# Patient Record
Sex: Male | Born: 1940 | Race: Black or African American | Hispanic: No | Smoking: Former smoker
Health system: Southern US, Community
[De-identification: ages and names within clinical notes are randomized; demographics above are authoritative.]

## PROBLEM LIST (undated history)

## (undated) DIAGNOSIS — T7840XA Allergy, unspecified, initial encounter: Secondary | ICD-10-CM

## (undated) DIAGNOSIS — G459 Transient cerebral ischemic attack, unspecified: Secondary | ICD-10-CM

## (undated) DIAGNOSIS — I509 Heart failure, unspecified: Secondary | ICD-10-CM

## (undated) DIAGNOSIS — R0601 Orthopnea: Secondary | ICD-10-CM

## (undated) DIAGNOSIS — N289 Disorder of kidney and ureter, unspecified: Secondary | ICD-10-CM

## (undated) DIAGNOSIS — M109 Gout, unspecified: Secondary | ICD-10-CM

## (undated) DIAGNOSIS — G4700 Insomnia, unspecified: Secondary | ICD-10-CM

## (undated) DIAGNOSIS — I2510 Atherosclerotic heart disease of native coronary artery without angina pectoris: Secondary | ICD-10-CM

## (undated) HISTORY — DX: Insomnia, unspecified: G47.00

## (undated) HISTORY — DX: Allergy, unspecified, initial encounter: T78.40XA

## (undated) HISTORY — PX: CARDIAC SURGERY: SHX584

## (undated) HISTORY — DX: Gout, unspecified: M10.9

## (undated) HISTORY — PX: JOINT REPLACEMENT: SHX530

## (undated) HISTORY — DX: Orthopnea: R06.01

---

## 2004-11-05 ENCOUNTER — Ambulatory Visit: Payer: Self-pay | Admitting: Rheumatology

## 2004-12-08 ENCOUNTER — Emergency Department: Payer: Self-pay | Admitting: Emergency Medicine

## 2004-12-12 ENCOUNTER — Inpatient Hospital Stay: Payer: Self-pay | Admitting: Internal Medicine

## 2005-03-18 ENCOUNTER — Ambulatory Visit: Payer: Self-pay | Admitting: Cardiovascular Disease

## 2005-04-02 ENCOUNTER — Encounter: Payer: Self-pay | Admitting: Cardiovascular Disease

## 2005-04-15 ENCOUNTER — Observation Stay: Payer: Self-pay | Admitting: Cardiovascular Disease

## 2005-04-15 ENCOUNTER — Other Ambulatory Visit: Payer: Self-pay

## 2005-04-16 ENCOUNTER — Other Ambulatory Visit: Payer: Self-pay

## 2005-05-01 ENCOUNTER — Encounter: Payer: Self-pay | Admitting: Cardiovascular Disease

## 2007-04-06 ENCOUNTER — Ambulatory Visit: Payer: Self-pay | Admitting: Cardiovascular Disease

## 2008-01-09 ENCOUNTER — Other Ambulatory Visit: Payer: Self-pay

## 2008-01-10 ENCOUNTER — Inpatient Hospital Stay: Payer: Self-pay | Admitting: Internal Medicine

## 2008-01-26 ENCOUNTER — Other Ambulatory Visit: Payer: Self-pay

## 2008-01-26 ENCOUNTER — Emergency Department: Payer: Self-pay | Admitting: Emergency Medicine

## 2008-09-27 ENCOUNTER — Ambulatory Visit: Payer: Self-pay | Admitting: Ophthalmology

## 2008-10-12 ENCOUNTER — Ambulatory Visit: Payer: Self-pay | Admitting: Ophthalmology

## 2009-11-05 ENCOUNTER — Inpatient Hospital Stay: Payer: Self-pay | Admitting: Internal Medicine

## 2011-02-11 ENCOUNTER — Ambulatory Visit: Payer: Self-pay | Admitting: General Practice

## 2011-02-27 ENCOUNTER — Inpatient Hospital Stay: Payer: Self-pay | Admitting: General Practice

## 2011-03-13 ENCOUNTER — Observation Stay: Payer: Self-pay | Admitting: Internal Medicine

## 2012-04-22 ENCOUNTER — Emergency Department: Payer: Self-pay | Admitting: Emergency Medicine

## 2012-04-22 LAB — COMPREHENSIVE METABOLIC PANEL
Albumin: 3.9 g/dL (ref 3.4–5.0)
Alkaline Phosphatase: 71 U/L (ref 50–136)
BUN: 13 mg/dL (ref 7–18)
Calcium, Total: 8.8 mg/dL (ref 8.5–10.1)
EGFR (African American): 59 — ABNORMAL LOW
Glucose: 89 mg/dL (ref 65–99)
Osmolality: 290 (ref 275–301)
Potassium: 4 mmol/L (ref 3.5–5.1)
SGOT(AST): 20 U/L (ref 15–37)
SGPT (ALT): 22 U/L (ref 12–78)
Sodium: 146 mmol/L — ABNORMAL HIGH (ref 136–145)
Total Protein: 7 g/dL (ref 6.4–8.2)

## 2012-04-22 LAB — CBC
HCT: 39.2 % — ABNORMAL LOW (ref 40.0–52.0)
HGB: 12.9 g/dL — ABNORMAL LOW (ref 13.0–18.0)
MCH: 27.7 pg (ref 26.0–34.0)
MCHC: 32.9 g/dL (ref 32.0–36.0)
WBC: 7.6 10*3/uL (ref 3.8–10.6)

## 2012-04-22 LAB — PROTIME-INR: INR: 1.1

## 2012-04-23 ENCOUNTER — Emergency Department: Payer: Self-pay | Admitting: Emergency Medicine

## 2012-04-23 LAB — COMPREHENSIVE METABOLIC PANEL
Anion Gap: 10 (ref 7–16)
Bilirubin,Total: 0.9 mg/dL (ref 0.2–1.0)
Calcium, Total: 8.9 mg/dL (ref 8.5–10.1)
Chloride: 110 mmol/L — ABNORMAL HIGH (ref 98–107)
Co2: 23 mmol/L (ref 21–32)
EGFR (African American): >60
EGFR (Non-African Amer.): >60
Osmolality: 285 (ref 275–301)
Potassium: 3.2 mmol/L — ABNORMAL LOW (ref 3.5–5.1)
SGOT(AST): 19 U/L (ref 15–37)
SGPT (ALT): 23 U/L (ref 12–78)
Sodium: 143 mmol/L (ref 136–145)

## 2012-04-23 LAB — PROTIME-INR
INR: 1.1
Prothrombin Time: 14.2 secs (ref 11.5–14.7)

## 2012-04-23 LAB — HEMOGLOBIN: HGB: 12.9 g/dL — ABNORMAL LOW (ref 13.0–18.0)

## 2014-07-04 ENCOUNTER — Ambulatory Visit: Payer: Self-pay | Admitting: General Surgery

## 2014-07-07 ENCOUNTER — Inpatient Hospital Stay: Payer: Self-pay | Admitting: Internal Medicine

## 2014-07-07 LAB — BASIC METABOLIC PANEL
Anion Gap: 13 (ref 7–16)
BUN: 34 mg/dL — ABNORMAL HIGH (ref 7–18)
CHLORIDE: 106 mmol/L (ref 98–107)
CREATININE: 2.15 mg/dL — AB (ref 0.60–1.30)
Calcium, Total: 8.2 mg/dL — ABNORMAL LOW (ref 8.5–10.1)
Co2: 19 mmol/L — ABNORMAL LOW (ref 21–32)
GFR CALC AF AMER: 39 — AB
GFR CALC NON AF AMER: 32 — AB
Glucose: 131 mg/dL — ABNORMAL HIGH (ref 65–99)
Osmolality: 285 (ref 275–301)
Potassium: 3.6 mmol/L (ref 3.5–5.1)
Sodium: 138 mmol/L (ref 136–145)

## 2014-07-07 LAB — CBC WITH DIFFERENTIAL/PLATELET
Basophil #: 0 10*3/uL (ref 0.0–0.1)
Basophil %: 0.5 %
EOS PCT: 0.1 %
Eosinophil #: 0 10*3/uL (ref 0.0–0.7)
HCT: 37.4 % — ABNORMAL LOW (ref 40.0–52.0)
HGB: 12 g/dL — ABNORMAL LOW (ref 13.0–18.0)
LYMPHS ABS: 0.8 10*3/uL — AB (ref 1.0–3.6)
Lymphocyte %: 18 %
MCH: 27.3 pg (ref 26.0–34.0)
MCHC: 32.2 g/dL (ref 32.0–36.0)
MCV: 85 fL (ref 80–100)
MONOS PCT: 12.4 %
Monocyte #: 0.5 x10 3/mm (ref 0.2–1.0)
Neutrophil #: 3 10*3/uL (ref 1.4–6.5)
Neutrophil %: 69 %
PLATELETS: 143 10*3/uL — AB (ref 150–440)
RBC: 4.4 10*6/uL (ref 4.40–5.90)
RDW: 14.5 % (ref 11.5–14.5)
WBC: 4.3 10*3/uL (ref 3.8–10.6)

## 2014-07-07 LAB — TROPONIN I: Troponin-I: 0.05 ng/mL

## 2014-07-08 LAB — PRO B NATRIURETIC PEPTIDE: B-Type Natriuretic Peptide: 6407 pg/mL — ABNORMAL HIGH (ref 0–125)

## 2014-07-08 LAB — TROPONIN I
TROPONIN-I: 0.04 ng/mL
Troponin-I: 0.04 ng/mL

## 2014-07-08 LAB — PROTIME-INR
INR: 1.1
Prothrombin Time: 14.4 secs (ref 11.5–14.7)

## 2014-07-08 LAB — APTT: Activated PTT: 25.8 secs (ref 23.6–35.9)

## 2014-07-09 LAB — CBC WITH DIFFERENTIAL/PLATELET
BASOS ABS: 0 10*3/uL (ref 0.0–0.1)
Basophil %: 0.4 %
EOS ABS: 0.1 10*3/uL (ref 0.0–0.7)
Eosinophil %: 1.4 %
HCT: 34.7 % — AB (ref 40.0–52.0)
HGB: 11.2 g/dL — ABNORMAL LOW (ref 13.0–18.0)
Lymphocyte #: 1 10*3/uL (ref 1.0–3.6)
Lymphocyte %: 24.5 %
MCH: 27.1 pg (ref 26.0–34.0)
MCHC: 32.3 g/dL (ref 32.0–36.0)
MCV: 84 fL (ref 80–100)
MONOS PCT: 12.7 %
Monocyte #: 0.5 x10 3/mm (ref 0.2–1.0)
Neutrophil #: 2.4 10*3/uL (ref 1.4–6.5)
Neutrophil %: 61 %
PLATELETS: 132 10*3/uL — AB (ref 150–440)
RBC: 4.13 10*6/uL — AB (ref 4.40–5.90)
RDW: 14.5 % (ref 11.5–14.5)
WBC: 3.9 10*3/uL (ref 3.8–10.6)

## 2014-07-09 LAB — BASIC METABOLIC PANEL
Anion Gap: 10 (ref 7–16)
BUN: 29 mg/dL — ABNORMAL HIGH (ref 7–18)
CO2: 23 mmol/L (ref 21–32)
CREATININE: 1.79 mg/dL — AB (ref 0.60–1.30)
Calcium, Total: 8.3 mg/dL — ABNORMAL LOW (ref 8.5–10.1)
Chloride: 105 mmol/L (ref 98–107)
EGFR (African American): 48 — ABNORMAL LOW
EGFR (Non-African Amer.): 40 — ABNORMAL LOW
Glucose: 111 mg/dL — ABNORMAL HIGH (ref 65–99)
Osmolality: 282 (ref 275–301)
Potassium: 3 mmol/L — ABNORMAL LOW (ref 3.5–5.1)
Sodium: 138 mmol/L (ref 136–145)

## 2014-07-10 DIAGNOSIS — I5023 Acute on chronic systolic (congestive) heart failure: Secondary | ICD-10-CM

## 2014-07-10 DIAGNOSIS — I2510 Atherosclerotic heart disease of native coronary artery without angina pectoris: Secondary | ICD-10-CM

## 2014-07-10 DIAGNOSIS — I480 Paroxysmal atrial fibrillation: Secondary | ICD-10-CM

## 2014-07-10 DIAGNOSIS — I429 Cardiomyopathy, unspecified: Secondary | ICD-10-CM

## 2014-07-10 LAB — BASIC METABOLIC PANEL
Anion Gap: 9 (ref 7–16)
BUN: 24 mg/dL — ABNORMAL HIGH (ref 7–18)
CO2: 22 mmol/L (ref 21–32)
Calcium, Total: 8.4 mg/dL — ABNORMAL LOW (ref 8.5–10.1)
Chloride: 106 mmol/L (ref 98–107)
Creatinine: 1.52 mg/dL — ABNORMAL HIGH (ref 0.60–1.30)
EGFR (Non-African Amer.): 48 — ABNORMAL LOW
GFR CALC AF AMER: 59 — AB
Glucose: 128 mg/dL — ABNORMAL HIGH (ref 65–99)
Osmolality: 280 (ref 275–301)
Potassium: 3.6 mmol/L (ref 3.5–5.1)
Sodium: 137 mmol/L (ref 136–145)

## 2014-07-11 ENCOUNTER — Telehealth: Payer: Self-pay | Admitting: Physician Assistant

## 2014-07-11 NOTE — Telephone Encounter (Signed)
[facility] called to let us know that pt was seen in ED on the 9th and that dr Welton Flakeskhan was suppose to do a consult on pt. But he refused to see him and saw R.Dunn. Thus R.Dunn did consult on him.  Now [facility] is calling to let us know that pt changed his mind and is now seeing dr Welton Flakeskhan. Dr Welton FlakesKhan and R.Dunn both are aware of this.

## 2014-07-12 LAB — CBC WITH DIFFERENTIAL/PLATELET
Basophil #: 0.1 10*3/uL (ref 0.0–0.1)
Basophil %: 0.6 %
EOS PCT: 0.7 %
Eosinophil #: 0.1 10*3/uL (ref 0.0–0.7)
HCT: 38.1 % — AB (ref 40.0–52.0)
HGB: 12.3 g/dL — ABNORMAL LOW (ref 13.0–18.0)
LYMPHS ABS: 1.3 10*3/uL (ref 1.0–3.6)
LYMPHS PCT: 10.8 %
MCH: 27 pg (ref 26.0–34.0)
MCHC: 32.3 g/dL (ref 32.0–36.0)
MCV: 84 fL (ref 80–100)
MONOS PCT: 8.8 %
Monocyte #: 1 x10 3/mm (ref 0.2–1.0)
NEUTROS ABS: 9.3 10*3/uL — AB (ref 1.4–6.5)
Neutrophil %: 79.1 %
Platelet: 303 10*3/uL (ref 150–440)
RBC: 4.56 10*6/uL (ref 4.40–5.90)
RDW: 14.2 % (ref 11.5–14.5)
WBC: 11.8 10*3/uL — ABNORMAL HIGH (ref 3.8–10.6)

## 2014-07-12 LAB — BASIC METABOLIC PANEL
ANION GAP: 9 (ref 7–16)
BUN: 20 mg/dL — AB (ref 7–18)
CALCIUM: 8.1 mg/dL — AB (ref 8.5–10.1)
Chloride: 107 mmol/L (ref 98–107)
Co2: 22 mmol/L (ref 21–32)
Creatinine: 1.41 mg/dL — ABNORMAL HIGH (ref 0.60–1.30)
EGFR (African American): >60
EGFR (Non-African Amer.): 53 — ABNORMAL LOW
GLUCOSE: 114 mg/dL — AB (ref 65–99)
Osmolality: 279 (ref 275–301)
Potassium: 3.7 mmol/L (ref 3.5–5.1)
Sodium: 138 mmol/L (ref 136–145)

## 2014-07-12 LAB — MAGNESIUM: MAGNESIUM: 2.1 mg/dL

## 2014-07-12 LAB — POTASSIUM: Potassium: 3.7 mmol/L (ref 3.5–5.1)

## 2014-07-13 LAB — CBC WITH DIFFERENTIAL/PLATELET
BASOS ABS: 0 10*3/uL (ref 0.0–0.1)
Basophil %: 0.2 %
EOS ABS: 0 10*3/uL (ref 0.0–0.7)
Eosinophil %: 0.4 %
HCT: 39.5 % — ABNORMAL LOW (ref 40.0–52.0)
HGB: 12.8 g/dL — ABNORMAL LOW (ref 13.0–18.0)
LYMPHS ABS: 0.9 10*3/uL — AB (ref 1.0–3.6)
Lymphocyte %: 7.3 %
MCH: 27 pg (ref 26.0–34.0)
MCHC: 32.3 g/dL (ref 32.0–36.0)
MCV: 84 fL (ref 80–100)
MONO ABS: 1.4 x10 3/mm — AB (ref 0.2–1.0)
Monocyte %: 11.4 %
NEUTROS ABS: 9.7 10*3/uL — AB (ref 1.4–6.5)
Neutrophil %: 80.7 %
PLATELETS: 378 10*3/uL (ref 150–440)
RBC: 4.72 10*6/uL (ref 4.40–5.90)
RDW: 14.7 % — AB (ref 11.5–14.5)
WBC: 12 10*3/uL — AB (ref 3.8–10.6)

## 2014-07-13 LAB — BASIC METABOLIC PANEL
ANION GAP: 9 (ref 7–16)
BUN: 22 mg/dL — AB (ref 7–18)
CO2: 21 mmol/L (ref 21–32)
CREATININE: 1.67 mg/dL — AB (ref 0.60–1.30)
Calcium, Total: 8.3 mg/dL — ABNORMAL LOW (ref 8.5–10.1)
Chloride: 107 mmol/L (ref 98–107)
EGFR (African American): 52 — ABNORMAL LOW
EGFR (Non-African Amer.): 43 — ABNORMAL LOW
GLUCOSE: 158 mg/dL — AB (ref 65–99)
OSMOLALITY: 280 (ref 275–301)
POTASSIUM: 3.8 mmol/L (ref 3.5–5.1)
Sodium: 137 mmol/L (ref 136–145)

## 2014-07-13 LAB — MAGNESIUM: Magnesium: 2 mg/dL

## 2014-07-13 LAB — APTT: Activated PTT: 60.9 secs — ABNORMAL HIGH (ref 23.6–35.9)

## 2014-07-13 LAB — PROTIME-INR
INR: 1.5
Prothrombin Time: 18.1 secs — ABNORMAL HIGH (ref 11.5–14.7)

## 2014-07-13 LAB — HEPARIN LEVEL (UNFRACTIONATED): Anti-Xa(Unfractionated): >1.1 IU/mL (ref 0.30–0.70)

## 2014-07-14 LAB — CBC
HCT: 36.5 % — AB (ref 40.0–52.0)
HGB: 11.7 g/dL — AB (ref 13.0–18.0)
MCH: 26.7 pg (ref 26.0–34.0)
MCHC: 32 g/dL (ref 32.0–36.0)
MCV: 84 fL (ref 80–100)
PLATELETS: 374 10*3/uL (ref 150–440)
RBC: 4.36 10*6/uL — ABNORMAL LOW (ref 4.40–5.90)
RDW: 14.7 % — ABNORMAL HIGH (ref 11.5–14.5)
WBC: 10.6 10*3/uL (ref 3.8–10.6)

## 2014-07-14 LAB — BASIC METABOLIC PANEL
Anion Gap: 9 (ref 7–16)
BUN: 16 mg/dL (ref 7–18)
CALCIUM: 8.1 mg/dL — AB (ref 8.5–10.1)
CHLORIDE: 109 mmol/L — AB (ref 98–107)
Co2: 20 mmol/L — ABNORMAL LOW (ref 21–32)
Creatinine: 1.35 mg/dL — ABNORMAL HIGH (ref 0.60–1.30)
EGFR (African American): >60
EGFR (Non-African Amer.): 55 — ABNORMAL LOW
Glucose: 134 mg/dL — ABNORMAL HIGH (ref 65–99)
OSMOLALITY: 279 (ref 275–301)
Potassium: 4.2 mmol/L (ref 3.5–5.1)
Sodium: 138 mmol/L (ref 136–145)

## 2014-07-14 LAB — HEPARIN LEVEL (UNFRACTIONATED): Anti-Xa(Unfractionated): >1.1 IU/mL (ref 0.30–0.70)

## 2014-07-15 LAB — COMPREHENSIVE METABOLIC PANEL
Albumin: 2.5 g/dL — ABNORMAL LOW (ref 3.4–5.0)
Alkaline Phosphatase: 101 U/L
Anion Gap: 10 (ref 7–16)
BUN: 19 mg/dL — ABNORMAL HIGH (ref 7–18)
Bilirubin,Total: 0.8 mg/dL (ref 0.2–1.0)
CALCIUM: 8.5 mg/dL (ref 8.5–10.1)
CREATININE: 1.68 mg/dL — AB (ref 0.60–1.30)
Chloride: 107 mmol/L (ref 98–107)
Co2: 22 mmol/L (ref 21–32)
EGFR (African American): 52 — ABNORMAL LOW
EGFR (Non-African Amer.): 43 — ABNORMAL LOW
GLUCOSE: 193 mg/dL — AB (ref 65–99)
Osmolality: 285 (ref 275–301)
POTASSIUM: 3.8 mmol/L (ref 3.5–5.1)
SGOT(AST): 16 U/L (ref 15–37)
SGPT (ALT): 18 U/L
SODIUM: 139 mmol/L (ref 136–145)
Total Protein: 6.5 g/dL (ref 6.4–8.2)

## 2014-07-16 LAB — CREATININE, SERUM
CREATININE: 1.87 mg/dL — AB (ref 0.60–1.30)
GFR CALC AF AMER: 46 — AB
GFR CALC NON AF AMER: 38 — AB

## 2014-07-18 LAB — COMPREHENSIVE METABOLIC PANEL
ALBUMIN: 2.7 g/dL — AB (ref 3.4–5.0)
ALT: 21 U/L
Alkaline Phosphatase: 95 U/L
Anion Gap: 7 (ref 7–16)
BUN: 20 mg/dL — ABNORMAL HIGH (ref 7–18)
Bilirubin,Total: 1 mg/dL (ref 0.2–1.0)
CALCIUM: 8.4 mg/dL — AB (ref 8.5–10.1)
CO2: 24 mmol/L (ref 21–32)
Chloride: 107 mmol/L (ref 98–107)
Creatinine: 1.74 mg/dL — ABNORMAL HIGH (ref 0.60–1.30)
EGFR (African American): 50 — ABNORMAL LOW
GFR CALC NON AF AMER: 41 — AB
GLUCOSE: 149 mg/dL — AB (ref 65–99)
Osmolality: 281 (ref 275–301)
Potassium: 3.9 mmol/L (ref 3.5–5.1)
SGOT(AST): 14 U/L — ABNORMAL LOW (ref 15–37)
Sodium: 138 mmol/L (ref 136–145)
Total Protein: 6.7 g/dL (ref 6.4–8.2)

## 2014-07-18 LAB — CBC WITH DIFFERENTIAL/PLATELET
BASOS ABS: 0 10*3/uL (ref 0.0–0.1)
Basophil %: 0.4 %
Eosinophil #: 0.1 10*3/uL (ref 0.0–0.7)
Eosinophil %: 1.1 %
HCT: 39.8 % — ABNORMAL LOW (ref 40.0–52.0)
HGB: 12.8 g/dL — AB (ref 13.0–18.0)
LYMPHS ABS: 1.3 10*3/uL (ref 1.0–3.6)
Lymphocyte %: 11.9 %
MCH: 27.1 pg (ref 26.0–34.0)
MCHC: 32.3 g/dL (ref 32.0–36.0)
MCV: 84 fL (ref 80–100)
MONO ABS: 0.6 x10 3/mm (ref 0.2–1.0)
MONOS PCT: 6 %
Neutrophil #: 8.7 10*3/uL — ABNORMAL HIGH (ref 1.4–6.5)
Neutrophil %: 80.6 %
Platelet: 430 10*3/uL (ref 150–440)
RBC: 4.74 10*6/uL (ref 4.40–5.90)
RDW: 14.7 % — AB (ref 11.5–14.5)
WBC: 10.8 10*3/uL — AB (ref 3.8–10.6)

## 2014-07-28 ENCOUNTER — Ambulatory Visit: Payer: Self-pay | Admitting: General Surgery

## 2014-10-30 NOTE — Consult Note (Signed)
PATIENT NAME:  Larry Lawrence, Nyxon D MR#:  [id number] DATE OF BIRTH:  Jan 07, 1941  DATE OF CONSULTATION:  07/10/2014  REFERRING PHYSICIAN:   CONSULTING PHYSICIAN:  Madolyn FriezeBrian S. Mace Weinberg, MD  HISTORY OF PRESENT ILLNESS: The patient is a 74 year old male with a past medical history of permanent atrial fibrillation, diabetes, hypertension, hyperlipidemia, ischemic cardiomyopathy, coronary artery disease with history of PCI, sleep apnea, and chronic renal insufficiency for evaluation of atrial fibrillation and acute on chronic systolic congestive heart failure. The patient has previously been followed by Dr. Welton FlakesKhan, but would prefer now to be followed by [facility]. He has a history of permanent atrial fibrillation by his report. He has had previous PCI, but none since 2006. He has a history of a cardiomyopathy. I do not have those records available for review. The patient has chronic dyspnea on exertion and pedal edema. However, over the past week, he had progressive increased dyspnea on exertion. He also had an episode of orthopnea and his pedal edema was worse. He has occasional palpitations. He denies chest pain. He has had episodes of syncope in the past predominantly associated with standing. He is unconscious for seconds by his report. He was admitted after being evaluated in the Emergency Room and found to be in atrial fibrillation with a rapid ventricular response. Cardiology is now asked to evaluate.   PAST MEDICAL HISTORY: Significant for prior stroke. He has diabetes mellitus, hypertension, hyperlipidemia. He has permanent atrial fibrillation and history of coronary artery disease with prior PCI last in 2006. He also has a history of sleep apnea. He has a history of a cardiomyopathy. He has a history of renal insufficiency as well. There is also gout. He has had previous cataract surgery and right knee surgery.  ALLERGIES: NIASPAN. HE ALSO STATES THAT THERE WAS A MEDICATION THAT MADE HIM COUGH PREVIOUSLY  THAT WAS USED FOR HYPERTENSION.   SOCIAL HISTORY: He does not smoke nor does he consume alcohol.   FAMILY HISTORY: Positive for coronary artery disease. His father had pancreatic cancer.   REVIEW OF SYSTEMS: He denies any headaches or fevers or chills. There is no productive cough or hemoptysis. There is no dysphagia, odynophagia, melena, or hematochezia. There is no dysuria or hematuria. There is no rash or seizure activity. There is orthopnea, but there is no PND. He has had pedal edema. The remaining systems are negative other than weakness.  PHYSICAL EXAMINATION: VITAL SIGNS: Today shows temperature of 98.1. Blood pressure is 133/108 and his pulse is 115. He is 96% saturation on 3 L. GENERAL: He is well-developed and well-nourished, in no acute distress. He does not appear to be depressed. There is no peripheral clubbing.  SKIN: Warm and dry.  BACK: Normal.  HEENT: Normal with normal eyelids.  NECK: Supple with a normal upstroke bilaterally, and there are no bruits noted. His jugular venous distention is difficult to assess. There is no thyromegaly.  CHEST: Clear to auscultation with normal expansion.  CARDIOVASCULAR: Reveals an irregular rhythm. I cannot appreciate murmurs, rubs, or gallops.  ABDOMEN: Not tender or distended, positive bowel sounds. No hepatosplenomegaly and no masses appreciated. There is no abdominal bruit. He has 2+ femoral pulses bilaterally, no bruits.  EXTREMITIES: Show trace to 1+ ankle edema. I cannot palpate cords. His distal pulses are diminished.  NEUROLOGIC: Grossly intact.  LABORATORY DATA: Today show a sodium of 137 with a potassium of 3.6. BUN and creatinine are 24 and 1.52 respectively. His troponins have been normal. His white blood cell  count is 3.9 with a hemoglobin of 11.2 and a platelet count of 132,000.  DIAGNOSTIC DATA: Echocardiogram interpreted by Dr. Park Breed showed an ejection fraction less than 20%. There was moderate left ventricular enlargement.  There was severe left atrial enlargement and moderate right atrial enlargement. There was moderate mitral regurgitation, moderate aortic insufficiency, moderate tricuspid regurgitation.  His chest x-ray from admission demonstrates a small ill-defined low density seen in the right mid lung, possibly related to pneumonia or atelectasis. Followup recommended.   DIAGNOSES: 1.  Permanent atrial fibrillation: We will need to review all records from Dr. Santo Held office. However, the patient gives a history of permanent atrial fibrillation. He has embolic risk factors of age greater than 7, prior stroke, diabetes mellitus, hypertension, coronary artery disease, and congestive heart failure. His CHA2DS2-VASc is, therefore, 7. He, therefore, needs long-term anticoagulation. I agree with apixaban 5 mg b.i.d. Given that addition, I would recommend discontinuing aspirin and Plavix. I do not think we need to continue antiplatelet therapy, given that his last PCI was in 2006. I would continue with metoprolol 50 mg q.6 for rate control and then transition to Toprol long-term. I would continue the Cardizem drip at this point and titrate to keep heart rate less than 100. Once it is clear, his heart rate is controlled, we will transition to p.o. Cardizem. I will discontinue his digoxin given his renal insufficiency. Given that his atrial fibrillation is permanent, we will plan rate control and anticoagulation long-term.  2.  Cardiomyopathy. The patient has severe left ventricular dysfunction. This apparently is long-standing, although we need to confirm this with outside records. This may be related to either coronary artery disease or tachycardia mediated related to his atrial fibrillation. Would continue to control heart rate as outlined under atrial fibrillation. I will plan to discontinue his clonidine. Once it is clear, his heart rate is controlled, and his blood pressure is stable, I would recommend adding an ARB and follow  his renal function closely. Apparently, he has had a cough with ACE inhibitors in the past. This can be titrated based on followup blood pressures. If his renal function will not tolerate an ARB, then would recommend adding hydralazine and increasing as tolerated. Once his medications are fully titrated, he would need repeat echocardiogram in 3 months. If ejection fraction less than 35%, would consider implantable cardiac defibrillator.  3.  Acute on chronic systolic congestive heart failure: The patient appears to be mildly volume overloaded. I would increase his Lasix to 40 mg daily and follow his renal function closely.  4.  Coronary artery disease: The patient does have a history of PCI. I have elected to discontinue his aspirin and Plavix given the addition of apixaban. We will continue statin.  5.  Chronic stage III renal failure: We need to follow his renal function closely with diuresis.  6.  Hyperlipidemia: Continue statin.  7.  Hypertension: I would prefer to discontinue the patient's clonidine and instead treating high blood pressure with an ARB and, if needed, the addition of hydralazine and nitrates given severe left ventricular dysfunction. 8.  Abnormal chest x-ray: He will need a followup PA and lateral chest x-ray, but we will leave this to primary care.   We will follow while he is in the hospital.    ____________________________ Madolyn Frieze. Jens Som, MD bsc:sw D: 07/10/2014 10:39:20 ET T: 07/10/2014 11:34:09 ET JOB#: [id number]  cc: Madolyn Frieze. Jens Som, MD, <Dictator> Lewayne Bunting MD ELECTRONICALLY SIGNED 07/10/2014 13:14

## 2014-10-30 NOTE — H&P (Signed)
PATIENT NAME:  Larry Lawrence, [name] MR#:  [id number] DATE OF BIRTH:  08-01-1940  DATE OF ADMISSION:  07/07/2014  REFERRING PHYSICIAN: Gladstone Pihavid Schaevitz, MD  PRIMARY CARE PHYSICIAN: Silas FloodSheikh A. Ellsworth Lennoxejan-Sie, MD  ADMITTING PHYSICIAN: Crissie FiguresEdavally N. Laurabelle Gorczyca, MD  PRIMARY CARDIOLOGIST: Laurier NancyShaukat A. Khan, MD  CHIEF COMPLAINT:  1.  Generalized weakness.  2.  Shortness of breath.  3.  Palpitations.   HISTORY OF PRESENT ILLNESS: A 74 year old Caucasian male with a past medical history significant for chronic atrial fibrillation; hypertension; diabetes mellitus, type 2; congestive heart failure; coronary artery disease, status post stent; sleep apnea; gout; hyperlipidemia; and chronic kidney disease, presents with the complaints of generalized weakness associated with some shortness of breath ongoing for the past 2 days. The patient stated that he was doing fine up until those symptoms. He also felt some kind of palpitations with heart racing on and off in the past 2 days. Denies any chest pain. No dizziness. No loss of consciousness. He does have some mild cough, but denies any sputum. No fever. No nausea. No vomiting. No diarrhea. No urinary symptoms. In the Emergency Room, the patient was evaluated by the ED physician and was found to be in atrial fibrillation with rapid ventricular rate of 159 beats per minute. Was given IV Cardizem push x 3 following which his heart rate kind of slowed down but not to the normal range. He started feeling better. At the current time, he denies any complaints. Further workup revealed a creatinine of 2.1, troponin was negative, and a chest x-ray was negative for any acute cardiovascular findings. EKG showed atrial fibrillation with 154 beats per minute. Troponin was within normal limits. In view of his continued increased heart, he was started on Cardizem drip, and he is presently stable. Denies any complaints at this time.    PAST MEDICAL HISTORY:  1.  Chronic atrial fibrillation.  2.   Diabetes mellitus, type 2.  3.  Coronary artery disease, status post  4.  Hypertension.  5.  Peripheral vascular disease.  6.  Congestive heart failure.  7.  Gout.  8.  Hyperlipidemia.  9.  Chronic kidney disease.  10.  Sleep apnea.   PAST SURGICAL HISTORY:  1.  Right knee surgery.  2.  Cataract surgery.   ALLERGIES: NIASPAN EXTENDED-RELEASE TABLET.   SOCIAL HISTORY: He is married and lives with his husband. No history of smoking, alcohol, or drug usage. He used to work in a [employer], [employer] currently retired.   FAMILY HISTORY: Mother with hypertension and kidney disease and pancreatic cancer and father died at the age 72 with a pancreatic cancer.    HOME MEDICATIONS:  1.  Allopurinol 300 mg tablet 1 tablet orally 2 times a day.  2.  Amlodipine/benazepril 10/40 mg tablet 1 tablet orally once a day.  3.  Plavix 75 mg tablet 1 tablet orally once a day.  4.  Isosorbide mononitrate 30 mg oral tablet extended release 1 tablet orally once a day.  5.  Albuterol 300 mg tablet orally 2 tablets orally 2 times a day.  6.  Lasix 20 mg tablet 1 tablet orally once a day.  7.  Lipitor 40 mg tablet 1 tablet orally once a day.  8.  Metformin 1000 mg tablet, 1 tablet orally 2 times a day.  9.  Pantoprazole 40 mg tablet 1 tablet orally once a day.  10.  Terbinafine 200 mg tablet orally 1 tablet orally once a day.    REVIEW OF SYSTEMS:  CONSTITUTIONAL: Negative for fever, chills. He does have some fatigue and generalized weakness ongoing for the past 2 days.  EYES: Negative for blurred vision, double vision. No pain. No redness. No discharge.  EARS, NOSE, AND THROAT: Negative for tinnitus, ear pain, or hearing loss. No epistaxis, nasal discharge, or difficulty swallowing.  RESPIRATORY: Negative for cough, wheezing, dyspnea, hemoptysis, painful respirations.  CARDIOVASCULAR: Negative for chest pain. Positive for palpitations. No dizziness. No syncopal episodes. No orthopnea. No dyspnea on exertion. No  pedal edema.  GASTROINTESTINAL: Negative for nausea, vomiting, diarrhea, abdominal pain, hematemesis, melena, rectal bleeding, GERD symptoms, or change in bowel habits.  GENITOURINARY: Negative for dysuria, frequency, urgency, or hematuria.  ENDOCRINE: Negative for polyuria, nocturia, heat or cold intolerance.  HEMATOLOGIC: Negative for anemia, easy bruising or bleeding, or swollen glands.   INTEGUMENTARY: Negative for acne, skin rash, or lesions.  MUSCULOSKELETAL: Negative for neck pain, back pain, shoulder pain. History of gout, stable on home medications.  NEUROLOGICAL: Negative for focal weakness or numbness. No tremors. No history of CVA, TIA, or seizure disorder, or memory loss.  PSYCHIATRIC: Negative for anxiety, insomnia, depression.   PHYSICAL EXAMINATION:  VITAL SIGNS: Temperature 98.4 degrees Fahrenheit, pulse rate 115 per minute, respirations 29 per minute, blood pressure 115/84, oxygen saturation 94% on room air.  GENERAL: Well-developed, well-nourished, alert, and oriented, in no acute distress, comfortably resting in the bed.  HEAD: Atraumatic, normocephalic.  EYES: Pupils are equal, react to light and accommodation. No conjunctival pallor. No scleral icterus. Extraocular movements are intact.  NOSE: No drainage, no lesions.  EARS: No drainage. No external lesions.  ORAL CAVITY: No mucosal lesions. No exudates.  NECK: Supple. No JVD. No thyromegaly. No carotid bruit. Range of motion of neck normal.  RESPIRATORY: Good respiratory effort. Not using accessory muscles of respiration. Bilateral vesicular breath sounds. No rales or rhonchi.  CARDIOVASCULAR: S1, S2 irregularly irregular. No murmurs, gallops, or clicks appreciated. Peripheral pulses equal at carotid, femoral, and pedal pulses. No peripheral edema.  GASTROINTESTINAL: Abdomen is soft and nontender. No hepatosplenomegaly. No masses. No rigidity. No guarding. Bowel sounds are present and equal in all 4 quadrants.   GENITOURINARY: Deferred.  MUSCULOSKELETAL: Gait not tested. No joint tenderness or effusion. Range of motion adequate. Strength and tone equal bilaterally.  SKIN: Inspection within normal limits. No obvious wound. LYMPHATIC: No cervical lymphadenopathy.  VASCULAR: Good dorsalis pedis and posterior tibial pulses.  NEUROLOGICAL: Alert, awake, and oriented x 3. Cranial nerves II-XII grossly intact. No sensory deficit. Motor strength is 5/5 in both upper and lower extremities. DTRs 2+ bilateral  symmetrically.   PSYCHIATRIC: Alert, awake, and oriented x 3. Judgment and insight are adequate. Memory and mood within normal limits.   LABORATORY STUDIES: Serum glucose 131, BUN 34, creatinine 2.1, sodium 138, potassium 3.6, chloride 106, bicarbonate 19, total calcium 8.2. Troponin 0.05. WBC 4.3, hemoglobin 12.0, hematocrit 37.4, platelet count 143,000. Prothrombin time 14.4, INR 1.1.   IMAGING STUDIES: Chest x-ray small ill-defined low density seen in the right mid lung which may represent pneumonia or subsegmental atelectasis, otherwise no acute cardiopulmonary findings.   EKG: Atrial fibrillation with ventricular rate of 154 beats per minute. No acute ST-T changes.    ASSESSMENT AND PLAN: A 74 year old Caucasian male with a past medical history significant for chronic atrial fibrillation; coronary artery disease, status post stent; hypertension; diabetes mellitus, type 2; hyperlipidemia on do not resuscitate status presents with generalized weakness of 2 days' duration, found to have  atrial fibrillation with rapid ventricular  rate on presentation to the Emergency Room. The patient received multiple doses of intravenous Cardizem push following which his rate improved, but did not normalize; hence, was started on Cardizem drip. The patient is comfortably lying in bed at this time.  1.  Atrial fibrillation with rapid ventricular rate. History of chronic atrial fibrillation. The patient not on any  anticoagulation, reason not known. PLAN: Admit to Critical Care Unit stepdown unit. Continue intravenous Cardizem drip for now for rate control. Cycle cardiac enzymes. Cardiology consult and echocardiogram requested. We will start heparin drip for stroke prophylaxis. Discussed the benefits and side effects of anticoagulation. The patient and the patient's wife agree understand and agree.  2.  History of coronary artery disease, status post stent, stable. No chest pain. Troponin x 1 negative. Monitor for now. Continue home medications. Cycle cardiac enzymes.  3.  Diabetes mellitus, type 2 on metformin. Stable clinically. We hold off metformin for now because of chronic kidney disease , stage III. We will put him on sliding scale insulin and monitor blood sugars.  4.  Hypertension, blood pressure is low normal. Hold amlodipine/benazepril for now. Continue other antihypertensive medications. Monitor blood pressure closely.  5.  Chronic kidney disease, stage III. Baseline creatinine not known. Creatinine now 2.1. The patient stable clinically. Monitor. Avoid nephrotoxic agents, and follow up BMP.  6.  History of congestive heart failure. Stable and well compensated. Continue Lasix. Check echocardiogram for ejection fraction.  7.  History of sleep apnea, not on CPAP because of history of intolerance. The patient is stable. Monitor.  8.  Hyperlipidemia, on statin. Continue same.  9.  History of gout, stable on allopurinol.  No flare-ups. Continue same.  10.  Deep vein thrombosis prophylaxis. Heparin drip.  11.  Gastrointestinal prophylaxis. Protonix.   CODE STATUS: Do not resuscitate.   TIME SPENT: 55 minutes.    ____________________________ Crissie Figures, MD enr:bm D: 07/08/2014 03:08:30 ET T: 07/08/2014 03:56:41 ET JOB#: [id number]  cc: Crissie Figures, MD, <Dictator> Silas Flood. Ellsworth Lennox, MD Crissie Figures MD ELECTRONICALLY SIGNED 07/08/2014 17:02

## 2014-10-30 NOTE — Discharge Summary (Signed)
PATIENT NAME:  Larry Lawrence, Larry Lawrence MR#:  [id number] DATE OF BIRTH:  September 23, 1940  DATE OF ADMISSION:  07/07/2014 DATE OF DISCHARGE:  07/16/2014   ADMITTING DIAGNOSES:  1.  Generalized weakness. 2.  Shortness of breath. 3.  Palpitations.   DISCHARGE DIAGNOSIS:   1.  Atrial fibrillation with rapid ventricular response status post cardioversion, heart rate was very hard to control.  2.  History of coronary artery disease, status post stent.  3.  Diabetes.  4.  Acute renal failure, likely due to mild congestive heart failure.  5.  Acute systolic congestive heart failure.  6.  History of gout.  7.  Type 2 diabetes.  8.  Peripheral vascular disease.  9.  Sleep apnea.  10. Chronic kidney disease stage 3. 11. Bibasilar pneumonia.   CONSULTANTS: Adrian Blackwater, MD  PERTINENT AND EVALUATIONS:  Admitting glucose 131, BUN 34, creatinine 2.15, sodium 138, potassium 3.61, chloride 106, CO2 of 19, calcium 8.2. Troponin 0.05, 0.04 and 0.04.  WBC 4.3, hemoglobin 12, platelet count 143,000.    Echocardiogram: 1.  Ejection fraction less than 20%, moderately increased left ventricular internal cavitary size, elevated main left atrial pressure.  2.  Impaired relaxation pattern, severely dilated left atrium, moderately dilated right atrium, moderate mitral valve regurgitation, mild aortic valve sclerosis, moderate aortic regurgitation.   Chest x-ray shows small ill-defined  in the right mid-lung, which may represent pneumonia.   HOSPITAL COURSE: Please refer to H and P done by the admitting physician. The patient is a 74 year old white male with history of chronic atrial fibrillation presented with shortness of breath and palpitations. The patient was noted to be in atrial fibrillation with RVR. He was in the Critical Care Unit and placed on Cardizem drip; however, his blood pressure dropped, so he was switched over to amiodarone drip.  The patient continued to have difficulty with heart rate being in poor control.   His echocardiogram showed a significant drop in his ejection fraction to 20%. He was seen by cardiology and had a cardiac catheterization which showed no significant coronary artery disease.  He did have some in-stent stenosis, but they were stable. The patient also had a TEE with cardioversion with improvement in his heart rate. Currently, his heart rate is stable and his sinus rhythm in control.  Due to his severe systolic dysfunction and an episode of ventricular tachycardia, he has a life vest in place.  The patient also was thought to have possible pneumonia and was treated with antibiotics.  At this time he is doing much better, heart rate much better control, stable for discharge.   DISCHARGE MEDICATIONS: Protonix 40 daily, allopurinol 300 mg 1 tab p.o. b.i.d., terbinafine 250 daily, Lipitor 40 daily, Plavix 75 p.o. daily, isosorbide mononitrate 30 daily, Timolol 0.05% 1 drop to each affected eye b.i.d., Lasix 40 mg 1 tab p.o. daily, losartan 25 p.o. daily, amiodarone 400 at 1 tab p.o. b.i.d., Apixaban 5 mg 1 tablet p.o. b.i.d., sliding scale insulin, metoprolol 50 at 1 tab p.o. daily, carvedilol 25 mg 1 tab p.o. b.i.d., fluticasone 1 puff b.i.d., potassium chloride 20 mEq 1 tab p.o. daily.  Please delete metformin.   DIET: Low-sodium, low-fat, low-cholesterol, carbohydrate-controlled diet.   ACTIVITY: As tolerated. PT evaluation and treatment.   FOLLOWUP: Follow with Dr. Welton Flakes in 1 to 2 weeks. Follow up with skilled nursing facility physician in 2 to 4 weeks.   TIME SPENT: 35 minutes.      ____________________________ Lacie Scotts Allena Katz, MD shp:DT D:  07/16/2014 11:46:22 ET T: 07/16/2014 12:24:14 ET JOB#: [id number]  cc: Larry Vantol H. Allena KatzPatel, MD, <Dictator> Charise CarwinSHREYANG H Shanley Furlough MD ELECTRONICALLY SIGNED 07/20/2014 14:42

## 2014-10-30 NOTE — Consult Note (Signed)
PATIENT NAME:  Larry Lawrence, [name] MR#:  [id number] DATE OF BIRTH:  [birth year]  DATE OF CONSULTATION:  07/08/2014   CONSULTING PHYSICIAN:  Laurier NancyShaukat A. Khan, MD   INDICATION FOR CONSULTATION: Congestive heart failure and palpitations and atrial fibrillation.   HISTORY OF PRESENT ILLNESS: This is a 74 year old Caucasian male with a past medical history of chronic atrial fibrillation, hypertension, diabetes type 2, congestive heart failure, coronary artery disease status post PCI and stenting, sleep apnea, gout, hyperlipidemia, chronic kidney disease, presented with complaints of generalized weakness associated with shortness of breath and PND orthopnea.  In the Emergency Room, he was found to be in atrial fibrillation with rapid ventricular response rate, initially was given Cardizem, did not respond very well and then digoxin was added.  His creatinine was 2.1.  I was asked to evaluate the patient.   PAST MEDICAL HISTORY: History of chronic atrial fibrillation, diabetes mellitus, coronary artery disease status post PCI, hypertension, peripheral vascular disease, congestive heart failure, gout and hyperlipidemia, chronic kidney disease, sleep apnea.   ALLERGIES: NAPROSYN.   SOCIAL HISTORY: Denies EtOH abuse or smoking.   FAMILY HISTORY: Mother with hypertension, kidney disease and pancreatitis.   MEDICATIONS: Amlodipine benazepril 10/40, allopurinol, Plavix 75 mg once a day, isosorbide, Lasix, Lipitor, metformin, pantoprazole.    REVIEW OF SYSTEMS:  He could not walk, he was feeling so weak. Chest x-ray showed right mid lung pneumonia. EKG shows atrial fibrillation with ventricular rate of 150, nonspecific ST-T changes.   PHYSICAL EXAMINATION:  GENERAL: He is alert and oriented, in mild distress due to shortness of breath. Heart rate still is 130, respirations 20. He is afebrile.  NECK: Positive JVD.  LUNGS: Good air entry. No rales or rhonchi.  HEART: Irregularly irregular. Normal S1, S2. No  audible murmur.  ABDOMEN: Soft, nontender, positive bowel sounds.  EXTREMITIES: 1+ pedal edema.   LABORATORY DATA: Troponin are negative.  BUN, however, is 34, creatinine 2.15, and white count is 4.3, hemoglobin 12, platelet count is 143,000.   ASSESSMENT AND PLAN: The patient has atrial fibrillation with rapid ventricular response rate. He was given digoxin and diltiazem, it did not work.  Right now is on amiodarone drip but his ejection fraction on echocardiogram is only 14%.  I doubt he is going to convert to sinus rhythm.  He had a PCI and stenting done, thus I do not advise adding Eliquis or Coumadin on top of aspirin and Plavix right now.  He is CHADS-2 but he would be high risk for bleeding. We will see how the patient responds to amiodarone drip, see if he converts.  We will follow the patient closely with you.   Thank you very much for the referral.     ____________________________ Laurier NancyShaukat A. Khan, MD sak:DT D: 07/08/2014 09:18:05 ET T: 07/08/2014 10:02:06 ET JOB#: [id number]  cc: Laurier NancyShaukat A. Khan, MD, <Dictator> Laurier NancySHAUKAT A KHAN MD ELECTRONICALLY SIGNED 07/12/2014 15:39

## 2014-10-30 NOTE — Discharge Summary (Signed)
PATIENT NAME:  Larry Lawrence, Larry Lawrence MR#:  [id number] DATE OF BIRTH:  1940-10-31  DATE OF ADMISSION:  07/07/2014 DATE OF DISCHARGE:    ADDENDUM:  Please refer to my discharge summary for admitting diagnosis and discharge diagnosis and hospital course.  Medications have been adjusted since this discharge summary has been done.   DISCHARGE MEDICATIONS: Protonix 40 daily, allopurinol 300 one tab p.o. b.i.Lawrence., terbinafine 250 one tablet p.o. daily, Lipitor 40 at bedtime, Plavix 75 p.o. daily, isosorbide mononitrate 30 one tablet p.o. daily, timolol ophthalmic drops 1 drop to each affected eye b.i.Lawrence., Lasix 40 one tab p.o. daily, losartan 25 p.o. daily, amiodarone 400 one tab p.o. b.i.Lawrence., apaxiban 5 mg 1 tab p.o. b.i.Lawrence., insulin sliding scale and metoprolol tartrate 50 one tab p.o. q.6, carvedilol 25 one tab p.o. b.i.Lawrence., Advair 1 puff b.i.Lawrence., potassium 20 mEq daily, guaifenesin 600 one tablet p.o. b.i.Lawrence., Ceftin 500 one tab p.o. q.12 x4 days, guaifenesin 15 mL every 6 p.r.n. for cough.   DIET: Low sodium, low fat, low cholesterol, carbohydrate-controlled diet.   ACTIVITY: As tolerated. PT evaluation and treatment.   FOLLOWUP: Follow with Dr. Park BreedKahn in 1 to 2 weeks. Skilled nursing facility [name] in 2 to 4 weeks. CPAP at bedtime.   Larry Katzatel note refer to discharge summary done already on 01/16. Patient stayed in the hospital. The patient was kept in the hospital because the nursing home was not able to arrange CPAP for him over the weekend and Dr. Welton FlakesKhan feels strongly that patient needs CPAP; however, it has been arranged and is stable for discharge.   TIME SPENT: 35 minutes spent on the discharge.     ____________________________ Larry ScottsShreyang H. Larry KatzPatel, Larry Lawrence shp:ST Lawrence: 07/18/2014 09:31:21 ET T: 07/18/2014 09:40:49 ET JOB#: [id number]  cc: Larry Rundell H. Larry KatzPatel, Larry Lawrence, <Dictator> Charise CarwinSHREYANG H Latorie Montesano Larry Lawrence ELECTRONICALLY SIGNED 07/20/2014 14:42

## 2015-02-20 ENCOUNTER — Inpatient Hospital Stay
Admit: 2015-02-20 | Discharge: 2015-02-20 | Disposition: A | Discharge status: Home / Self Care | Payer: Medicare Other | Attending: Internal Medicine | Admitting: Internal Medicine

## 2015-02-20 ENCOUNTER — Inpatient Hospital Stay
Admission: EM | Admit: 2015-02-20 | Discharge: 2015-02-21 | DRG: 194 | Disposition: A | Discharge status: Hospice | Payer: Medicare Other | Attending: Internal Medicine | Admitting: Internal Medicine

## 2015-02-20 ENCOUNTER — Inpatient Hospital Stay: Admit: 2015-02-20 | Payer: Medicare Other

## 2015-02-20 ENCOUNTER — Emergency Department: Payer: Medicare Other

## 2015-02-20 ENCOUNTER — Encounter: Payer: Self-pay | Admitting: Emergency Medicine

## 2015-02-20 DIAGNOSIS — J189 Pneumonia, unspecified organism: Principal | ICD-10-CM | POA: Diagnosis present

## 2015-02-20 DIAGNOSIS — Z955 Presence of coronary angioplasty implant and graft: Secondary | ICD-10-CM | POA: Diagnosis not present

## 2015-02-20 DIAGNOSIS — I429 Cardiomyopathy, unspecified: Secondary | ICD-10-CM | POA: Diagnosis present

## 2015-02-20 DIAGNOSIS — N183 Chronic kidney disease, stage 3 (moderate): Secondary | ICD-10-CM | POA: Diagnosis present

## 2015-02-20 DIAGNOSIS — Z9981 Dependence on supplemental oxygen: Secondary | ICD-10-CM | POA: Diagnosis not present

## 2015-02-20 DIAGNOSIS — Z7982 Long term (current) use of aspirin: Secondary | ICD-10-CM

## 2015-02-20 DIAGNOSIS — Z888 Allergy status to other drugs, medicaments and biological substances status: Secondary | ICD-10-CM

## 2015-02-20 DIAGNOSIS — I2510 Atherosclerotic heart disease of native coronary artery without angina pectoris: Secondary | ICD-10-CM | POA: Diagnosis present

## 2015-02-20 DIAGNOSIS — E876 Hypokalemia: Secondary | ICD-10-CM | POA: Diagnosis not present

## 2015-02-20 DIAGNOSIS — I5032 Chronic diastolic (congestive) heart failure: Secondary | ICD-10-CM | POA: Diagnosis present

## 2015-02-20 DIAGNOSIS — R778 Other specified abnormalities of plasma proteins: Secondary | ICD-10-CM | POA: Diagnosis present

## 2015-02-20 DIAGNOSIS — Z87891 Personal history of nicotine dependence: Secondary | ICD-10-CM | POA: Diagnosis not present

## 2015-02-20 DIAGNOSIS — J181 Lobar pneumonia, unspecified organism: Secondary | ICD-10-CM

## 2015-02-20 DIAGNOSIS — Z79899 Other long term (current) drug therapy: Secondary | ICD-10-CM

## 2015-02-20 DIAGNOSIS — Z79891 Long term (current) use of opiate analgesic: Secondary | ICD-10-CM

## 2015-02-20 DIAGNOSIS — N189 Chronic kidney disease, unspecified: Secondary | ICD-10-CM | POA: Diagnosis present

## 2015-02-20 DIAGNOSIS — N1831 Chronic kidney disease, stage 3a: Secondary | ICD-10-CM | POA: Diagnosis present

## 2015-02-20 DIAGNOSIS — I5022 Chronic systolic (congestive) heart failure: Secondary | ICD-10-CM

## 2015-02-20 DIAGNOSIS — R7989 Other specified abnormal findings of blood chemistry: Secondary | ICD-10-CM | POA: Diagnosis present

## 2015-02-20 DIAGNOSIS — Z66 Do not resuscitate: Secondary | ICD-10-CM | POA: Diagnosis present

## 2015-02-20 DIAGNOSIS — N179 Acute kidney failure, unspecified: Secondary | ICD-10-CM | POA: Diagnosis present

## 2015-02-20 HISTORY — DX: Atherosclerotic heart disease of native coronary artery without angina pectoris: I25.10

## 2015-02-20 HISTORY — DX: Disorder of kidney and ureter, unspecified: N28.9

## 2015-02-20 HISTORY — DX: Heart failure, unspecified: I50.9

## 2015-02-20 LAB — TROPONIN I
Troponin I: 0.07 ng/mL — ABNORMAL HIGH (ref <0.031)
Troponin I: 0.04 ng/mL — ABNORMAL HIGH (ref <0.031)
Troponin I: 0.12 ng/mL — ABNORMAL HIGH (ref <0.031)
Troponin I: 0.04 ng/mL — ABNORMAL HIGH (ref <0.031)

## 2015-02-20 LAB — COMPREHENSIVE METABOLIC PANEL
ALBUMIN: 3 g/dL — AB (ref 3.5–5.0)
ALT: 7 U/L — ABNORMAL LOW (ref 17–63)
ANION GAP: 12 (ref 5–15)
AST: 20 U/L (ref 15–41)
Alkaline Phosphatase: 92 U/L (ref 38–126)
BUN: 28 mg/dL — ABNORMAL HIGH (ref 6–20)
CHLORIDE: 93 mmol/L — AB (ref 101–111)
CO2: 30 mmol/L (ref 22–32)
Calcium: 8.3 mg/dL — ABNORMAL LOW (ref 8.9–10.3)
Creatinine, Ser: 2.18 mg/dL — ABNORMAL HIGH (ref 0.61–1.24)
GFR calc Af Amer: 33 mL/min — ABNORMAL LOW (ref >60)
GFR calc non Af Amer: 28 mL/min — ABNORMAL LOW (ref >60)
GLUCOSE: 220 mg/dL — AB (ref 65–99)
POTASSIUM: 2.4 mmol/L — AB (ref 3.5–5.1)
SODIUM: 135 mmol/L (ref 135–145)
TOTAL PROTEIN: 6.4 g/dL — AB (ref 6.5–8.1)
Total Bilirubin: 1 mg/dL (ref 0.3–1.2)

## 2015-02-20 LAB — CBC WITH DIFFERENTIAL/PLATELET
BASOS PCT: 0 %
Basophils Absolute: 0 10*3/uL (ref 0–0.1)
Eosinophils Absolute: 0 10*3/uL (ref 0–0.7)
Eosinophils Relative: 0 %
HEMATOCRIT: 34.9 % — AB (ref 40.0–52.0)
HEMOGLOBIN: 11.2 g/dL — AB (ref 13.0–18.0)
LYMPHS ABS: 0.7 10*3/uL — AB (ref 1.0–3.6)
LYMPHS PCT: 5 %
MCH: 25.8 pg — ABNORMAL LOW (ref 26.0–34.0)
MCHC: 32.2 g/dL (ref 32.0–36.0)
MCV: 80.1 fL (ref 80.0–100.0)
MONOS PCT: 10 %
Monocytes Absolute: 1.4 10*3/uL — ABNORMAL HIGH (ref 0.2–1.0)
NEUTROS ABS: 11.7 10*3/uL — AB (ref 1.4–6.5)
NEUTROS PCT: 85 %
Platelets: 244 10*3/uL (ref 150–440)
RBC: 4.35 MIL/uL — ABNORMAL LOW (ref 4.40–5.90)
RDW: 19 % — ABNORMAL HIGH (ref 11.5–14.5)
WBC: 13.8 10*3/uL — ABNORMAL HIGH (ref 3.8–10.6)

## 2015-02-20 LAB — URINALYSIS COMPLETE WITH MICROSCOPIC (ARMC ONLY)
Bilirubin Urine: NEGATIVE
Glucose, UA: NEGATIVE mg/dL
Hgb urine dipstick: NEGATIVE
KETONES UR: NEGATIVE mg/dL
Leukocytes, UA: NEGATIVE
NITRITE: NEGATIVE
PH: 5 (ref 5.0–8.0)
PROTEIN: 30 mg/dL — AB
SPECIFIC GRAVITY, URINE: 1.018 (ref 1.005–1.030)

## 2015-02-20 LAB — POTASSIUM: POTASSIUM: 3 mmol/L — AB (ref 3.5–5.1)

## 2015-02-20 LAB — GLUCOSE, CAPILLARY
GLUCOSE-CAPILLARY: 212 mg/dL — AB (ref 65–99)
GLUCOSE-CAPILLARY: 178 mg/dL — AB (ref 65–99)

## 2015-02-20 LAB — MAGNESIUM: MAGNESIUM: 2.3 mg/dL (ref 1.7–2.4)

## 2015-02-20 MED ORDER — ACETAMINOPHEN 500 MG PO TABS: 500.0000 mg | ORAL_TABLET | Freq: Four times a day (QID) | ORAL | Status: DC | PRN

## 2015-02-20 MED ORDER — AZITHROMYCIN 500 MG IV SOLR
500.0000 mg | INTRAVENOUS | Status: DC
  Administered 2015-02-21: 500 mg via INTRAVENOUS
  Filled 2015-02-20 (×2): qty 500

## 2015-02-20 MED ORDER — ASPIRIN 325 MG PO TABS
325.0000 mg | ORAL_TABLET | Freq: Every day | ORAL | Status: DC
  Administered 2015-02-20 – 2015-02-21 (×2): 325 mg via ORAL
  Filled 2015-02-20 (×3): qty 1

## 2015-02-20 MED ORDER — POTASSIUM CHLORIDE CRYS ER 20 MEQ PO TBCR
40.0000 meq | EXTENDED_RELEASE_TABLET | Freq: Once | ORAL | Status: AC
  Administered 2015-02-20: 40 meq via ORAL
  Filled 2015-02-20: qty 2

## 2015-02-20 MED ORDER — AMIODARONE HCL 200 MG PO TABS
200.0000 mg | ORAL_TABLET | Freq: Every day | ORAL | Status: DC
  Administered 2015-02-20 – 2015-02-21 (×2): 200 mg via ORAL
  Filled 2015-02-20 (×2): qty 1

## 2015-02-20 MED ORDER — TIMOLOL MALEATE 0.5 % OP SOLN
1.0000 [drp] | Freq: Every day | OPHTHALMIC | Status: DC
  Administered 2015-02-20 – 2015-02-21 (×2): 1 [drp] via OPHTHALMIC
  Filled 2015-02-20: qty 5

## 2015-02-20 MED ORDER — TRAZODONE HCL 50 MG PO TABS
50.0000 mg | ORAL_TABLET | Freq: Every day | ORAL | Status: DC
  Administered 2015-02-20: 50 mg via ORAL
  Filled 2015-02-20: qty 1

## 2015-02-20 MED ORDER — ONDANSETRON HCL 4 MG/2ML IJ SOLN: 4.0000 mg | Freq: Four times a day (QID) | INTRAMUSCULAR | Status: DC | PRN

## 2015-02-20 MED ORDER — SODIUM CHLORIDE 0.9 % IV BOLUS (SEPSIS)
500.0000 mL | Freq: Once | INTRAVENOUS | Status: AC
  Administered 2015-02-20: 500 mL via INTRAVENOUS

## 2015-02-20 MED ORDER — SODIUM CHLORIDE 0.9 % IV SOLN: 250.0000 mL | INTRAVENOUS | Status: DC | PRN

## 2015-02-20 MED ORDER — ENSURE ENLIVE PO LIQD
237.0000 mL | Freq: Two times a day (BID) | ORAL | Status: DC
  Administered 2015-02-20 – 2015-02-21 (×3): 237 mL via ORAL

## 2015-02-20 MED ORDER — SODIUM CHLORIDE 0.9 % IJ SOLN: 3.0000 mL | INTRAMUSCULAR | Status: DC | PRN

## 2015-02-20 MED ORDER — DEXTROSE 5 % IV SOLN
INTRAVENOUS | Status: AC
  Filled 2015-02-20: qty 500

## 2015-02-20 MED ORDER — CARVEDILOL 25 MG PO TABS
25.0000 mg | ORAL_TABLET | Freq: Two times a day (BID) | ORAL | Status: DC
  Administered 2015-02-20 – 2015-02-21 (×2): 25 mg via ORAL
  Filled 2015-02-20 (×2): qty 1

## 2015-02-20 MED ORDER — ISOSORBIDE MONONITRATE ER 30 MG PO TB24
30.0000 mg | ORAL_TABLET | Freq: Every day | ORAL | Status: DC
  Administered 2015-02-20 – 2015-02-21 (×2): 30 mg via ORAL
  Filled 2015-02-20 (×2): qty 1

## 2015-02-20 MED ORDER — LOSARTAN POTASSIUM 25 MG PO TABS
25.0000 mg | ORAL_TABLET | Freq: Every day | ORAL | Status: DC
  Administered 2015-02-20 – 2015-02-21 (×2): 25 mg via ORAL
  Filled 2015-02-20 (×2): qty 1

## 2015-02-20 MED ORDER — HEPARIN SODIUM (PORCINE) 5000 UNIT/ML IJ SOLN
5000.0000 [IU] | Freq: Three times a day (TID) | INTRAMUSCULAR | Status: DC
Start: 2015-02-20 — End: 2015-02-21
  Administered 2015-02-20 – 2015-02-21 (×4): 5000 [IU] via SUBCUTANEOUS
  Filled 2015-02-20 (×4): qty 1

## 2015-02-20 MED ORDER — FUROSEMIDE 40 MG PO TABS
80.0000 mg | ORAL_TABLET | Freq: Every day | ORAL | Status: DC
  Administered 2015-02-20 – 2015-02-21 (×2): 80 mg via ORAL
  Filled 2015-02-20 (×2): qty 2

## 2015-02-20 MED ORDER — SODIUM CHLORIDE 0.9 % IJ SOLN
3.0000 mL | Freq: Two times a day (BID) | INTRAMUSCULAR | Status: DC
  Administered 2015-02-20 – 2015-02-21 (×3): 3 mL via INTRAVENOUS

## 2015-02-20 MED ORDER — ONDANSETRON HCL 4 MG PO TABS: 4.0000 mg | ORAL_TABLET | Freq: Four times a day (QID) | ORAL | Status: DC | PRN

## 2015-02-20 MED ORDER — POTASSIUM CHLORIDE 10 MEQ/100ML IV SOLN
10.0000 meq | INTRAVENOUS | Status: AC
  Administered 2015-02-20 (×2): 10 meq via INTRAVENOUS
  Filled 2015-02-20 (×2): qty 100

## 2015-02-20 MED ORDER — DEXTROSE 5 % IV SOLN
INTRAVENOUS | Status: AC
  Administered 2015-02-20: 08:00:00
  Filled 2015-02-20: qty 500

## 2015-02-20 MED ORDER — HYDROCODONE-ACETAMINOPHEN 5-325 MG PO TABS: 1.0000 | ORAL_TABLET | ORAL | Status: DC | PRN

## 2015-02-20 MED ORDER — INSULIN ASPART 100 UNIT/ML ~~LOC~~ SOLN
0.0000 [IU] | Freq: Three times a day (TID) | SUBCUTANEOUS | Status: DC
  Administered 2015-02-21: 2 [IU] via SUBCUTANEOUS
  Administered 2015-02-21: 3 [IU] via SUBCUTANEOUS
  Filled 2015-02-20: qty 3
  Filled 2015-02-20: qty 2

## 2015-02-20 MED ORDER — ASPIRIN EC 81 MG PO TBEC: 81.0000 mg | DELAYED_RELEASE_TABLET | Freq: Every day | ORAL | Status: DC

## 2015-02-20 MED ORDER — DEXTROSE 5 % IV SOLN
500.0000 mg | Freq: Once | INTRAVENOUS | Status: AC
  Filled 2015-02-20: qty 500

## 2015-02-20 MED ORDER — SODIUM CHLORIDE 0.9 % IJ SOLN: 3.0000 mL | Freq: Two times a day (BID) | INTRAMUSCULAR | Status: DC

## 2015-02-20 MED ORDER — POTASSIUM CHLORIDE CRYS ER 20 MEQ PO TBCR
20.0000 meq | EXTENDED_RELEASE_TABLET | Freq: Every day | ORAL | Status: DC
  Administered 2015-02-20 – 2015-02-21 (×2): 20 meq via ORAL
  Filled 2015-02-20 (×3): qty 1

## 2015-02-20 MED ORDER — ALLOPURINOL 300 MG PO TABS
300.0000 mg | ORAL_TABLET | Freq: Two times a day (BID) | ORAL | Status: DC
  Administered 2015-02-20 – 2015-02-21 (×3): 300 mg via ORAL
  Filled 2015-02-20 (×3): qty 1

## 2015-02-20 MED ORDER — DEXTROSE 5 % IV SOLN
1.0000 g | Freq: Once | INTRAVENOUS | Status: AC
  Administered 2015-02-20: 1 g via INTRAVENOUS
  Filled 2015-02-20: qty 10

## 2015-02-20 NOTE — H&P (Signed)
[facility] [place] [facility]   PATIENT NAME: Larry Lawrence    MR#:  [id number]  DATE OF BIRTH:  1941-06-16  DATE OF ADMISSION:  02/20/2015  PRIMARY CARE PHYSICIAN: No primary care provider on file.   REQUESTING/REFERRING PHYSICIAN: Dr. Scotty Court  CHIEF COMPLAINT:   Chief Complaint  Patient presents with  . Breathing Problem   fever of 101  Generalized weakness  HISTORY OF PRESENT ILLNESS:  Larry Lawrence  is a 74 y.o. male with a known history of congestive heart failure on home O2 to 3 L/m, chronic kidney disease, coronary artery disease, under care of hospice nurse at home was brought in with the complaints of fever of 10 70F, generalized weakness, dry cough and found to have diminished breath sounds on the right lower lung by hospice nurse. Patient mentions that he has been having some dry cough for the past 3 days and has been having some fever for which she has been taking some Augmentin but since the symptoms increased last night and his home hospice nurse found to diminished air entry in the right lung base, was brought to the emergency room for further evaluation. Denies any chest pain, wheezing, nausea, vomiting, diarrhea, abdominal pain, dysuria. Evaluation in the ED revealed stable vital signs and workup revealed elevated WBC of 13.8, potassium 2.4, BUN/creatinine of 28 over 2.18, troponin 0.07. Chest x-ray right lower lobe consolidation. EKG sinus bradycardia with ventricular rate of 56 bpm. Nonspecific ST-T abnormalities. After obtaining blood cultures patient was started on IV antibiotics-Rocephin and azithromycin and hospitalist service was consulted for further management. Patient is receiving O2 supplementation per nasal cannula and is comfortably resting in the bed, denies any complaints such as chest pain, shortness of breath. PAST MEDICAL HISTORY:   Past Medical History  Diagnosis Date  . CHF (congestive heart failure)   . Renal disorder    . Coronary artery disease     PAST SURGICAL HISTORY:   Past Surgical History  Procedure Laterality Date  . Joint replacement    . Cardiac surgery      3 stents in "main artery"    SOCIAL HISTORY:   Social History  Substance Use Topics  . Smoking status: Former Games developer  . Smokeless tobacco: Not on file  . Alcohol Use: No    FAMILY HISTORY:   Family History  Problem Relation Age of Onset  . Hypertension Mother   . Pancreatic cancer Mother   . Hypertension Father   . Prostate cancer Father     DRUG ALLERGIES:   Allergies  Allergen Reactions  . Niaspan [Niacin Er] Other (See Comments)    Hot flashes    REVIEW OF SYSTEMS:   Review of Systems  Constitutional: Positive for fever and malaise/fatigue. Negative for chills.  HENT: Negative for ear pain, hearing loss, nosebleeds, sore throat and tinnitus.   Eyes: Negative for blurred vision, double vision, pain, discharge and redness.  Respiratory: Positive for cough and shortness of breath. Negative for hemoptysis, sputum production and wheezing.   Cardiovascular: Negative for chest pain, palpitations, orthopnea and leg swelling.  Gastrointestinal: Negative for nausea, vomiting, abdominal pain, diarrhea, constipation, blood in stool and melena.  Genitourinary: Negative for dysuria, urgency, frequency and hematuria.  Musculoskeletal: Negative for back pain, joint pain and neck pain.  Skin: Negative for itching and rash.  Neurological: Negative for dizziness, tingling, sensory change, focal weakness and seizures.  Endo/Heme/Allergies: Does not bruise/bleed easily.  Psychiatric/Behavioral: Negative for depression. The patient  is not nervous/anxious.     MEDICATIONS AT HOME:   Prior to Admission medications   Medication Sig Start Date End Date Taking? Authorizing Provider  acetaminophen (TYLENOL) 500 MG tablet Take 500 mg by mouth every 6 (six) hours as needed.   Yes Historical Provider, MD  allopurinol (ZYLOPRIM) 300  MG tablet Take 1 tablet by mouth 2 (two) times daily. 02/01/15  Yes Historical Provider, MD  amiodarone (PACERONE) 200 MG tablet Take 200 mg by mouth daily.   Yes Historical Provider, MD  amoxicillin-clavulanate (AUGMENTIN) 875-125 MG per tablet Take 1 tablet by mouth 2 (two) times daily.   Yes Historical Provider, MD  aspirin 325 MG tablet Take 325 mg by mouth daily.   Yes Historical Provider, MD  carvedilol (COREG) 25 MG tablet Take 25 mg by mouth 2 (two) times daily with a meal.   Yes Historical Provider, MD  furosemide (LASIX) 40 MG tablet Take 80 mg by mouth daily.   Yes Historical Provider, MD  HYDROcodone-acetaminophen (NORCO/VICODIN) 5-325 MG per tablet Take 1 tablet by mouth every 4 (four) hours as needed for moderate pain.   Yes Historical Provider, MD  ibuprofen (ADVIL,MOTRIN) 200 MG tablet Take 200 mg by mouth every 6 (six) hours as needed.   Yes Historical Provider, MD  isosorbide mononitrate (IMDUR) 30 MG 24 hr tablet Take 30 mg by mouth daily.   Yes Historical Provider, MD  losartan (COZAAR) 25 MG tablet Take 25 mg by mouth daily.   Yes Historical Provider, MD  potassium chloride SA (K-DUR,KLOR-CON) 20 MEQ tablet Take 1 tablet by mouth daily. 12/02/14  Yes Historical Provider, MD  timolol (TIMOPTIC) 0.5 % ophthalmic solution Place 1 drop into both eyes daily. 01/05/15  Yes Historical Provider, MD  traZODone (DESYREL) 50 MG tablet Take 50 mg by mouth at bedtime.   Yes Historical Provider, MD      VITAL SIGNS:  Blood pressure 122/64, pulse 51, temperature 97.9 F (36.6 C), temperature source Oral, resp. rate 23, height 5\' 11"  (1.803 m), weight 81.647 kg (180 lb), SpO2 100 %.  PHYSICAL EXAMINATION:  Physical Exam  Constitutional: He is oriented to person, place, and time. He appears well-developed and well-nourished. No distress.  HENT:  Head: Normocephalic and atraumatic.  Right Ear: External ear normal.  Left Ear: External ear normal.  Nose: Nose normal.  Mouth/Throat: Oropharynx  is clear and moist. No oropharyngeal exudate.  Eyes: EOM are normal. Pupils are equal, round, and reactive to light. No scleral icterus.  Neck: Normal range of motion. Neck supple. No JVD present. No thyromegaly present.  Cardiovascular: Normal rate, regular rhythm, normal heart sounds and intact distal pulses.  Exam reveals no friction rub.   No murmur heard. Respiratory: Effort normal. No respiratory distress. He has no wheezes. He has no rales. He exhibits no tenderness.  Diminished air entry right base +  GI: Soft. Bowel sounds are normal. He exhibits no distension and no mass. There is no tenderness. There is no rebound and no guarding.  Musculoskeletal: Normal range of motion. He exhibits edema.  Lymphadenopathy:    He has no cervical adenopathy.  Neurological: He is alert and oriented to person, place, and time. He has normal reflexes. He displays normal reflexes. No cranial nerve deficit. He exhibits normal muscle tone.  Skin: Skin is warm. No rash noted. No erythema.  Psychiatric: He has a normal mood and affect. His behavior is normal. Thought content normal.   LABORATORY PANEL:   CBC  Recent  Labs Lab 02/20/15 0251  WBC 13.8*  HGB 11.2*  HCT 34.9*  PLT 244   ------------------------------------------------------------------------------------------------------------------  Chemistries   Recent Labs Lab 02/20/15 0251  NA 135  K 2.4*  CL 93*  CO2 30  GLUCOSE 220*  BUN 28*  CREATININE 2.18*  CALCIUM 8.3*  AST 20  ALT 7*  ALKPHOS 92  BILITOT 1.0   ------------------------------------------------------------------------------------------------------------------  Cardiac Enzymes  Recent Labs Lab 02/20/15 0251  TROPONINI 0.07*   ------------------------------------------------------------------------------------------------------------------  RADIOLOGY:  Dg Chest 2 View  02/20/2015   CLINICAL DATA:  Fever, weakness, and difficulty breathing for 2 days.   EXAM: CHEST  2 VIEW  COMPARISON:  07/14/2014  FINDINGS: Cardiac enlargement. Normal pulmonary vascularity. Calcified and tortuous aorta. Mediastinal contours appear intact. Infiltration in the left lower lung with small bilateral pleural effusions. Changes likely to represent pneumonia. No pneumothorax.  IMPRESSION: Infiltration in the right lung base with small bilateral pleural effusions. Likely pneumonia.   Electronically Signed   By: Burman Nieves M.D.   On: 02/20/2015 03:33    EKG:   Orders placed or performed during the hospital encounter of 02/20/15  . ED EKG  . ED EKG  . EKG 12-Lead  . EKG 12-Lead  Sinus brachycardia with ventricular rate of 56 bpm, first-degree AV block, LVH, nonspecific ST-T abnormalities.   IMPRESSION AND PLAN:   74 year old Caucasian male with history of chronic kidney disease, congestive heart failure on home oxygen 3 L/m, coronary artery disease status post stent, under care of hospice at home, DO NOT RESUSCITATE status presents to the emergency room with the complaints of three-day history of fever, dry cough, generalized weakness and shortness of breath. 1. Right lower lobe pneumonia, community-acquired. Plan: Admit, continue IV antibiotics-azithromycin and ceftriaxone, O2 supplementation, follow-up O2 saturations, follow-up CBC, blood cultures and sputum cultures. 2. Hypokalemia secondary to poor oral intake. Plan: Hypertension supplementation, follow-up BMP. 3. Mildly elevated troponin, likely related to CK D. Rule out acute coronary event because of history of coronary artery disease. Patient without any chest pain, EKG no acute ischemic changes. Plan: Telemetry monitoring, continue aspirin and beta blocker. Cycle cardiac enzymes. Request echo cardiogram and cardiac consultation for further advice. 4. CK D, creatinine 2.18. Monitor BMP, avoid nephrotoxic agents. 5. Coronary artery disease status post stent. No chest pain. 6 history of congestive heart  failure, on home O2 3 L/m. Patient stable. Continue oxygen supplementation and home medications. Check echocardiogram.    All the records are reviewed and case discussed with ED provider. Management plans discussed with the patient, family and they are in agreement.  CODE STATUS: DO NOT RESUSCITATE  TOTAL TIME TAKING CARE OF THIS PATIENT: 50 minutes.    Crissie Figures M.D on 02/20/2015 at 6:36 AM  Between 7am to 6pm - Pager - [phone]  After 6pm go to [email or web address] - password [email or web address] [facility]  [facility]  Office  [phone]  CC: Primary care physician; No primary care provider on file.

## 2015-02-20 NOTE — ED Notes (Signed)
TROP I 0.07 K+ 2.4

## 2015-02-20 NOTE — Progress Notes (Addendum)
[facility] - [place] at [facility]   PATIENT NAME: Larry Lawrence    MR#:  [id number]  DATE OF BIRTH:  Aug 09, 1940  SUBJECTIVE:  Feels a lot better today. Uses home oxygen for Chronic CHF,systolic per wife  REVIEW OF SYSTEMS:   Review of Systems  Constitutional: Negative for fever, chills and weight loss.  HENT: Negative for ear discharge, ear pain and nosebleeds.   Eyes: Negative for blurred vision, pain and discharge.  Respiratory: Negative for sputum production, shortness of breath, wheezing and stridor.   Cardiovascular: Negative for chest pain, palpitations, orthopnea and PND.  Gastrointestinal: Negative for nausea, vomiting, abdominal pain and diarrhea.  Genitourinary: Negative for urgency and frequency.  Musculoskeletal: Negative for back pain and joint pain.  Neurological: Positive for weakness. Negative for sensory change, speech change and focal weakness.  Psychiatric/Behavioral: Negative for depression and hallucinations. The patient is not nervous/anxious.   All other systems reviewed and are negative.  Tolerating Diet:yes  DRUG ALLERGIES:   Allergies  Allergen Reactions  . Niaspan [Niacin Er] Other (See Comments)    Hot flashes    VITALS:  Blood pressure 142/72, pulse 55, temperature 97.9 F (36.6 C), temperature source Oral, resp. rate 17, height 5\' 11"  (1.803 m), weight 95.664 kg (210 lb 14.4 oz), SpO2 100 %.  PHYSICAL EXAMINATION:   Physical Exam  GENERAL:  74 y.o.-year-old patient lying in the bed with no acute distress.  EYES: Pupils equal, round, reactive to light and accommodation. No scleral icterus. Extraocular muscles intact.  HEENT: Head atraumatic, normocephalic. Oropharynx and nasopharynx clear.  NECK:  Supple, no jugular venous distention. No thyroid enlargement, no tenderness.  LUNGS: decreased breath sounds bilaterally, no wheezing, rales, rhonchi. No use of accessory muscles of respiration.  CARDIOVASCULAR: S1, S2  normal. No murmurs, rubs, or gallops.  ABDOMEN: Soft, nontender, nondistended. Bowel sounds present. No organomegaly or mass.  EXTREMITIES: No cyanosis, clubbing ,++ bilateral edema b/l.    NEUROLOGIC: Cranial nerves II through XII are intact. No focal Motor or sensory deficits b/l.   PSYCHIATRIC: The patient is alert and oriented x 3.  SKIN: No obvious rash, lesion, or ulcer.    LABORATORY PANEL:   CBC  Recent Labs Lab 02/20/15 0251  WBC 13.8*  HGB 11.2*  HCT 34.9*  PLT 244    Chemistries   Recent Labs Lab 02/20/15 0251  NA 135  K 2.4*  CL 93*  CO2 30  GLUCOSE 220*  BUN 28*  CREATININE 2.18*  CALCIUM 8.3*  AST 20  ALT 7*  ALKPHOS 92  BILITOT 1.0    Cardiac Enzymes  Recent Labs Lab 02/20/15 0906  TROPONINI 0.04*    RADIOLOGY:  Dg Chest 2 View  02/20/2015   CLINICAL DATA:  Fever, weakness, and difficulty breathing for 2 days.  EXAM: CHEST  2 VIEW  COMPARISON:  07/14/2014  FINDINGS: Cardiac enlargement. Normal pulmonary vascularity. Calcified and tortuous aorta. Mediastinal contours appear intact. Infiltration in the left lower lung with small bilateral pleural effusions. Changes likely to represent pneumonia. No pneumothorax.  IMPRESSION: Infiltration in the right lung base with small bilateral pleural effusions. Likely pneumonia.   Electronically Signed   By: Burman Nieves M.D.   On: 02/20/2015 03:33     ASSESSMENT AND PLAN:   74 year old Caucasian male with history of chronic kidney disease, congestive heart failure on home oxygen 3 L/m, coronary artery disease status post stent, under care of hospice at home, DO NOT RESUSCITATE status presents to  the emergency room with the complaints of three-day history of fever, dry cough, generalized weakness and shortness of breath. 1. Right lower lobe pneumonia, community-acquired. - continue IV antibiotics-azithromycin and ceftriaxone, O2 supplementation, follow-up O2 saturations, follow-up CBC, blood cultures and  sputum cultures. -BC neg -afebrile. Doing well. Change to po abxs 2. Hypokalemia secondary to poor oral intake. -repleting  3. Mildly elevated troponin, likely related to CK D.  Patient without any chest pain, EKG no acute ischemic changes.  4. CK D, creatinine 2.18. Monitor BMP, avoid nephrotoxic agents. -creat 1.9  5. Coronary artery disease status post stent. No chest pain. Echo shows EF 30-35%  Overall art baslein with improving labs . Ok to go home and resume hospice services at home D/w wife anf pt and agreeable Case discussed with Care Management/Social Worker. Management plans discussed with the patient, family and they are in agreement.  CODE STATUS: full  DVT Prophylaxis: lovenox  TOTAL TIME TAKING CARE OF THIS PATIENT: 35 minutes.  >50% time spent on counselling and coordination of care  POSSIBLE D/C IN 1-2 DAYS, DEPENDING ON CLINICAL CONDITION.   Ariadna Setter M.D on 02/20/2015 at 4:27 PM  Between 7am to 6pm - Pager - [phone]  After 6pm go to [email or web address] - password [email or web address] [facility]  [facility]  Office  [phone]  CC: Primary care physician; No primary care provider on file.

## 2015-02-20 NOTE — ED Notes (Signed)
Patient presents to Emergency Department via EMS with complaints of breathing difficulties and fever.  Per EMS pt has decreased lung sounds in the right lung field.  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  Pt states fever at home 101F and treated with 650 tylenol at 1900 and 400 IBU at 2300.  Hospice worker requests chest xray for possible PNX.

## 2015-02-20 NOTE — Progress Notes (Signed)
Patient transfer to unit Room 235 wife at bedside

## 2015-02-20 NOTE — ED Provider Notes (Signed)
[facility] Emergency Department Provider Note  ____________________________________________  Time seen: 2:30 AM on arrival by EMS  I have reviewed the triage vital signs and the nursing notes.   HISTORY  Chief Complaint Breathing Problem    HPI Larry Lawrence is a 74 y.o. male is brought to the ED tonight for fever and decreased breath sounds in the right lung field per the hospice nurse at home. The patient has end-stage renal disease and CHF for which she is under care of hospice. He's had a fever at home to 101 and been treated with Tylenol and ibuprofen by hospice. They also started him on Augmentin approximately 24 hours ago. The patient denies chest pain or worsened shortness of breath. He uses 3 L of nasal cannula oxygen at all times. No syncope abdominal pain nausea vomiting or diarrhea.  Spell's reports the patient is unable to stand due to generalized weakness. She is not able to adequately assist the patient in standing and moving around the house.   Past Medical History  Diagnosis Date  . CHF (congestive heart failure)   . Renal disorder     There are no active problems to display for this patient.   Past Surgical History  Procedure Laterality Date  . Joint replacement    . Cardiac surgery      3 stents in "main artery"    Current Outpatient Rx  Name  Route  Sig  Dispense  Refill  . acetaminophen (TYLENOL) 500 MG tablet   Oral   Take 500 mg by mouth every 6 (six) hours as needed.         Marland Kitchen allopurinol (ZYLOPRIM) 300 MG tablet   Oral   Take 1 tablet by mouth 2 (two) times daily.         Marland Kitchen amiodarone (PACERONE) 200 MG tablet   Oral   Take 200 mg by mouth daily.         Marland Kitchen amoxicillin-clavulanate (AUGMENTIN) 875-125 MG per tablet   Oral   Take 1 tablet by mouth 2 (two) times daily.         Marland Kitchen aspirin 325 MG tablet   Oral   Take 325 mg by mouth daily.         . carvedilol (COREG) 25 MG tablet   Oral   Take 25 mg by  mouth 2 (two) times daily with a meal.         . furosemide (LASIX) 40 MG tablet   Oral   Take 80 mg by mouth daily.         Marland Kitchen HYDROcodone-acetaminophen (NORCO/VICODIN) 5-325 MG per tablet   Oral   Take 1 tablet by mouth every 4 (four) hours as needed for moderate pain.         Marland Kitchen ibuprofen (ADVIL,MOTRIN) 200 MG tablet   Oral   Take 200 mg by mouth every 6 (six) hours as needed.         . isosorbide mononitrate (IMDUR) 30 MG 24 hr tablet   Oral   Take 30 mg by mouth daily.         Marland Kitchen losartan (COZAAR) 25 MG tablet   Oral   Take 25 mg by mouth daily.         . potassium chloride SA (K-DUR,KLOR-CON) 20 MEQ tablet   Oral   Take 1 tablet by mouth daily.         . timolol (TIMOPTIC) 0.5 % ophthalmic solution  Both Eyes   Place 1 drop into both eyes daily.         . traZODone (DESYREL) 50 MG tablet   Oral   Take 50 mg by mouth at bedtime.           Allergies Niaspan  History reviewed. No pertinent family history.  Social History Social History  Substance Use Topics  . Smoking status: Former Games developer  . Smokeless tobacco: None  . Alcohol Use: No    Review of Systems  Constitutional: Positive fever. No weight changes. Profound fatigue and generalized weakness, unable to stand. Eyes:No blurry vision or double vision.  ENT: No sore throat. Cardiovascular: No chest pain. Respiratory: No dyspnea or cough. Gastrointestinal: Negative for abdominal pain, vomiting and diarrhea.  No BRBPR or melena. Genitourinary: Negative for dysuria, urinary retention, bloody urine, or difficulty urinating. Musculoskeletal: Negative for back pain. No joint swelling or pain. Skin: Negative for rash. Neurological: Negative for headaches, focal weakness or numbness. Psychiatric:No anxiety or depression.   Endocrine:No hot/cold intolerance, changes in energy, or sleep difficulty.  10-point ROS otherwise  negative.  ____________________________________________   PHYSICAL EXAM:  VITAL SIGNS: ED Triage Vitals  Enc Vitals Group     BP 02/20/15 0244 137/73 mmHg     Pulse Rate 02/20/15 0244 56     Resp 02/20/15 0244 26     Temp 02/20/15 0244 97.9 F (36.6 C)     Temp Source 02/20/15 0244 Oral     SpO2 02/20/15 0244 100 %     Weight 02/20/15 0244 180 lb (81.647 kg)     Height 02/20/15 0244 5\' 11"  (1.803 m)     Head Cir --      Peak Flow --      Pain Score 02/20/15 0246 0     Pain Loc --      Pain Edu? --      Excl. in GC? --      Constitutional: Alert and oriented. Well appearing, low energy. Eyes: No scleral icterus. No conjunctival pallor. PERRL. EOMI ENT   Head: Normocephalic and atraumatic.   Nose: No congestion/rhinnorhea. No septal hematoma   Mouth/Throat: MMM, no pharyngeal erythema. No peritonsillar mass. No uvula shift.   Neck: No stridor. No SubQ emphysema. No meningismus. Hematological/Lymphatic/Immunilogical: No cervical lymphadenopathy. Cardiovascular: RRR. Normal and symmetric distal pulses are present in all extremities. No murmurs, rubs, or gallops. Respiratory: Breath sounds diminished in the right base. No wheezes/rales/rhonchi. Gastrointestinal: Soft and nontender. No distention. There is no CVA tenderness.  No rebound, rigidity, or guarding. Genitourinary: deferred Musculoskeletal: Nontender with normal range of motion in all extremities. No joint effusions.  No lower extremity tenderness.  No edema. Neurologic:   Normal speech and language.  CN 2-10 normal. Motor grossly intact. No gross focal neurologic deficits are appreciated.  Skin:  Skin is warm, dry and intact. No rash noted.  No petechiae, purpura, or bullae. Psychiatric: Mood and affect are normal. Speech and behavior are normal. Patient exhibits appropriate insight and judgment.  ____________________________________________    LABS (pertinent positives/negatives) (all labs  ordered are listed, but only abnormal results are displayed) Labs Reviewed  COMPREHENSIVE METABOLIC PANEL - Abnormal; Notable for the following:    Potassium 2.4 (*)    Chloride 93 (*)    Glucose, Bld 220 (*)    BUN 28 (*)    Creatinine, Ser 2.18 (*)    Calcium 8.3 (*)    Total Protein 6.4 (*)    Albumin 3.0 (*)  ALT 7 (*)    GFR calc non Af Amer 28 (*)    GFR calc Af Amer 33 (*)    All other components within normal limits  TROPONIN I - Abnormal; Notable for the following:    Troponin I 0.07 (*)    All other components within normal limits  CBC WITH DIFFERENTIAL/PLATELET - Abnormal; Notable for the following:    WBC 13.8 (*)    RBC 4.35 (*)    Hemoglobin 11.2 (*)    HCT 34.9 (*)    MCH 25.8 (*)    RDW 19.0 (*)    Neutro Abs 11.7 (*)    Lymphs Abs 0.7 (*)    Monocytes Absolute 1.4 (*)    All other components within normal limits  URINALYSIS COMPLETEWITH MICROSCOPIC ([facility] ONLY)   ____________________________________________   EKG  Interpreted by me Sinus bradycardia rate of 56, first-degree AV block, normal axis. Voltage criteria for LVH with repolarization abnormality. No acute ischemic changes.  ____________________________________________    RADIOLOGY  Chest x-ray reveals right lower lobe infiltrate consistent with pneumonia  ____________________________________________   PROCEDURES CRITICAL CARE Performed by: Scotty Court, Jadier Rockers   Total critical care time: 35 minutes  Critical care time was exclusive of separately billable procedures and treating other patients.  Critical care was necessary to treat or prevent imminent or life-threatening deterioration.  Critical care was time spent personally by me on the following activities: development of treatment plan with patient and/or surrogate as well as nursing, discussions with consultants, evaluation of patient's response to treatment, examination of patient, obtaining history from patient or surrogate,  ordering and performing treatments and interventions, ordering and review of laboratory studies, ordering and review of radiographic studies, pulse oximetry and re-evaluation of patient's condition.  ____________________________________________   INITIAL IMPRESSION / ASSESSMENT AND PLAN / ED COURSE  Pertinent labs & imaging results that were available during my care of the patient were reviewed by me and considered in my medical decision making (see chart for details).  Patient presents with profound weakness and fever. We will check labs urinalysis chest x-ray for possible infectious source. Low suspicion for encephalitis or meningitis. Despite being a hospice patient, patient and family favor admission despite being started on outpatient antibiotics due to profound weakness and inability of family to adequately assess the patient at home.  ----------------------------------------- 4:43 AM on 02/20/2015 -----------------------------------------  Severe hypokalemia on labs. Also mildly elevated troponin, and x-ray confirmed right lower lobe pneumonia. We'll give her ceftriaxone and azithromycin, replete potassium, and admit to hospital.  ____________________________________________   FINAL CLINICAL IMPRESSION(S) / ED DIAGNOSES  Final diagnoses:  Acute hypokalemia  RLL pneumonia      Sharman Cheek, MD 02/20/15 [phone]

## 2015-02-20 NOTE — Clinical Documentation Improvement (Signed)
Hospitalist  Based on the findings below, please document any associated diagnoses/conditions the patient has or may have.   What condition(s) are being treated with the use of home oxygen?    Supporting Information: *Patient is on home oxygen 3 L/m**   Please exercise your independent, professional judgment when responding. A specific answer is not anticipated or expected.   Thank You,  Alesia Richards, RN CDS [facility] Health Information Management [email or web address] [phone]

## 2015-02-20 NOTE — Clinical Documentation Improvement (Signed)
Hospitalist  Can the diagnosis of CHF be further specified by type and acuity?    Acuity - Acute, Chronic, Acute on Chronic   Type - Systolic, Diastolic, Systolic and Diastolic  Other  Clinically Undetermined    Supporting Information: *MD notes state congestive heart failure *Patient takes  furosemide daily at home*   Please exercise your independent, professional judgment when responding. A specific answer is not anticipated or expected.   Thank You,   Alesia Richards, RN CDS [facility] Health Information Management [email or web address] [email or web address] [phone]

## 2015-02-20 NOTE — Progress Notes (Addendum)
Electrolyte Supplementation - INITIAL   Pharmacy Consult for Electrolyte Supplementaion Indication: Hypokalemia  Allergies  Allergen Reactions  . Niaspan [Niacin Er] Other (See Comments)    Hot flashes    Patient Measurements: Height:  (180.3 cm) Weight: 210 lb 14.4 oz (95.664 kg) IBW/kg (Calculated) : 75.3   Vital Signs: Temp: 97.9 F (36.6 C) (08/22 1132) Temp Source: Oral (08/22 1132) BP: 142/72 mmHg (08/22 1132) Pulse Rate: 55 (08/22 1132) Intake/Output from previous day:   Intake/Output from this shift: Total I/O In: 240 [P.O.:240] Out: 200 [Urine:200]  Labs:  Recent Labs  02/20/15 0251  WBC 13.8*  HGB 11.2*  HCT 34.9*  PLT 244  CREATININE 2.18*  ALBUMIN 3.0*  PROT 6.4*  AST 20  ALT 7*  ALKPHOS 92  BILITOT 1.0   Estimated Creatinine Clearance: 36.2 mL/min (by C-G formula based on Cr of 2.18).   Microbiology: No results found for this or any previous visit (from the past 720 hour(s)).  Medical History: Past Medical History  Diagnosis Date  . CHF (congestive heart failure)   . Renal disorder   . Coronary artery disease     Medications:  Scheduled:  . allopurinol  300 mg Oral BID  . amiodarone  200 mg Oral Daily  . aspirin  325 mg Oral Daily  . [START ON 02/21/2015] azithromycin  500 mg Intravenous Q24H  . carvedilol  25 mg Oral BID WC  . feeding supplement (ENSURE ENLIVE)  237 mL Oral BID BM  . furosemide  80 mg Oral Daily  . heparin  5,000 Units Subcutaneous 3 times per day  . insulin aspart  0-9 Units Subcutaneous TID WC  . isosorbide mononitrate  30 mg Oral Daily  . losartan  25 mg Oral Daily  . potassium chloride SA  20 mEq Oral Daily  . potassium chloride  40 mEq Oral Once  . sodium chloride  3 mL Intravenous Q12H  . timolol  1 drop Both Eyes Daily  . traZODone  50 mg Oral QHS    Assessment: Pharmacy consulted to supplement potassium and magnesium in a 74 yo male admitted for CAP.   K: 2.4, Mag: pending  Goal of  Therapy:  Normalize magnesium and potassium  Plan:  Patient has received Potassium chloride 20 meq IV once and oral potassium 20 meq.  Will continue current orders for potassium 20 meq daily.  Will order an additional potassium chloride 40 meq PO once and recheck potassium at 1900 to assess for further supplementation .  Magnesium level pending as add on lab.   Pharmacy will continue to follow.  Tawny Raspberry G 02/20/2015,3:10 PM

## 2015-02-20 NOTE — Progress Notes (Signed)
Larry Lawrence is a 74 y.o. male  [id number]  Primary Cardiologist: Adrian Blackwater Reason for Consultation: Elevated troponin  HPI: This is a 74 year old African-American male with a past medical history of CHF coronary artery disease status post PCI and stenting in the past presented to the emergency room with mildly elevated troponin. He is a hospice patient and presented to the emergency room with 101 temperature and generalized weakness and shortness of breath. He denies any chest pain.   Review of Systems: Review of Systems  Unable to perform ROS Respiratory: Positive for cough and shortness of breath.   Musculoskeletal: Positive for falls.  All other systems reviewed and are negative.     Past Medical History  Diagnosis Date  . CHF (congestive heart failure)   . Renal disorder   . Coronary artery disease     Medications Prior to Admission  Medication Sig Dispense Refill  . acetaminophen (TYLENOL) 500 MG tablet Take 500 mg by mouth every 6 (six) hours as needed.    Marland Kitchen allopurinol (ZYLOPRIM) 300 MG tablet Take 1 tablet by mouth 2 (two) times daily.    Marland Kitchen amiodarone (PACERONE) 200 MG tablet Take 200 mg by mouth daily.    Marland Kitchen amoxicillin-clavulanate (AUGMENTIN) 875-125 MG per tablet Take 1 tablet by mouth 2 (two) times daily.    Marland Kitchen aspirin 325 MG tablet Take 325 mg by mouth daily.    . carvedilol (COREG) 25 MG tablet Take 25 mg by mouth 2 (two) times daily with a meal.    . furosemide (LASIX) 40 MG tablet Take 80 mg by mouth daily.    Marland Kitchen HYDROcodone-acetaminophen (NORCO/VICODIN) 5-325 MG per tablet Take 1 tablet by mouth every 4 (four) hours as needed for moderate pain.    Marland Kitchen ibuprofen (ADVIL,MOTRIN) 200 MG tablet Take 200 mg by mouth every 6 (six) hours as needed.    . isosorbide mononitrate (IMDUR) 30 MG 24 hr tablet Take 30 mg by mouth daily.    Marland Kitchen losartan (COZAAR) 25 MG tablet Take 25 mg by mouth daily.    . potassium chloride SA (K-DUR,KLOR-CON) 20 MEQ tablet Take 1 tablet by  mouth daily.    . timolol (TIMOPTIC) 0.5 % ophthalmic solution Place 1 drop into both eyes daily.    . traZODone (DESYREL) 50 MG tablet Take 50 mg by mouth at bedtime.       Marland Kitchen allopurinol  300 mg Oral BID  . amiodarone  200 mg Oral Daily  . aspirin  325 mg Oral Daily  . [START ON 02/21/2015] azithromycin  500 mg Intravenous Q24H  . carvedilol  25 mg Oral BID WC  . furosemide  80 mg Oral Daily  . heparin  5,000 Units Subcutaneous 3 times per day  . isosorbide mononitrate  30 mg Oral Daily  . losartan  25 mg Oral Daily  . potassium chloride  10 mEq Intravenous Q1 Hr x 2  . potassium chloride SA  20 mEq Oral Daily  . sodium chloride  3 mL Intravenous Q12H  . timolol  1 drop Both Eyes Daily  . traZODone  50 mg Oral QHS    Infusions:    Allergies  Allergen Reactions  . Niaspan [Niacin Er] Other (See Comments)    Hot flashes    Social History   Social History  . Marital Status: Married    Spouse Name: N/A  . Number of Children: N/A  . Years of Education: N/A   Occupational History  .  Not on file.   Social History Main Topics  . Smoking status: Former Games developer  . Smokeless tobacco: Not on file  . Alcohol Use: No  . Drug Use: Not on file  . Sexual Activity: Not on file   Other Topics Concern  . Not on file   Social History Narrative  . No narrative on file    Family History  Problem Relation Age of Onset  . Hypertension Mother   . Pancreatic cancer Mother   . Hypertension Father   . Prostate cancer Father     PHYSICAL EXAM: Filed Vitals:   02/20/15 1132  BP: 142/72  Pulse: 55  Temp: 97.9 F (36.6 C)  Resp: 17    No intake or output data in the 24 hours ending 02/20/15 1207  General:  Well appearing. No respiratory difficulty HEENT: normal Neck: supple. no JVD. Carotids 2+ bilat; no bruits. No lymphadenopathy or thryomegaly appreciated. Cor: PMI nondisplaced. Regular rate & rhythm. No rubs, gallops or murmurs. Lungs: clear Abdomen: soft, nontender,  nondistended. No hepatosplenomegaly. No bruits or masses. Good bowel sounds. Extremities: no cyanosis, clubbing, rash, edema Neuro: alert & oriented x 3, cranial nerves grossly intact. moves all 4 extremities w/o difficulty. Affect pleasant.  ECG: ECG revealed sinus bradycardia 56 bpm LVH with nonspecific ST-T changes is also first-degree AV block   Results for orders placed or performed during the hospital encounter of 02/20/15 (from the past 24 hour(s))  Comprehensive metabolic panel     Status: Abnormal   Collection Time: 02/20/15  2:51 AM  Result Value Ref Range   Sodium 135 135 - 145 mmol/L   Potassium 2.4 (LL) 3.5 - 5.1 mmol/L   Chloride 93 (L) 101 - 111 mmol/L   CO2 30 22 - 32 mmol/L   Glucose, Bld 220 (H) 65 - 99 mg/dL   BUN 28 (H) 6 - 20 mg/dL   Creatinine, Ser 1.61 (H) 0.61 - 1.24 mg/dL   Calcium 8.3 (L) 8.9 - 10.3 mg/dL   Total Protein 6.4 (L) 6.5 - 8.1 g/dL   Albumin 3.0 (L) 3.5 - 5.0 g/dL   AST 20 15 - 41 U/L   ALT 7 (L) 17 - 63 U/L   Alkaline Phosphatase 92 38 - 126 U/L   Total Bilirubin 1.0 0.3 - 1.2 mg/dL   GFR calc non Af Amer 28 (L) >60 mL/min   GFR calc Af Amer 33 (L) >60 mL/min   Anion gap 12 5 - 15  Troponin I     Status: Abnormal   Collection Time: 02/20/15  2:51 AM  Result Value Ref Range   Troponin I 0.07 (H) <0.031 ng/mL  CBC with Differential     Status: Abnormal   Collection Time: 02/20/15  2:51 AM  Result Value Ref Range   WBC 13.8 (H) 3.8 - 10.6 K/uL   RBC 4.35 (L) 4.40 - 5.90 MIL/uL   Hemoglobin 11.2 (L) 13.0 - 18.0 g/dL   HCT 09.6 (L) 04.5 - 40.9 %   MCV 80.1 80.0 - 100.0 fL   MCH 25.8 (L) 26.0 - 34.0 pg   MCHC 32.2 32.0 - 36.0 g/dL   RDW 81.1 (H) 91.4 - 78.2 %   Platelets 244 150 - 440 K/uL   Neutrophils Relative % 85 %   Neutro Abs 11.7 (H) 1.4 - 6.5 K/uL   Lymphocytes Relative 5 %   Lymphs Abs 0.7 (L) 1.0 - 3.6 K/uL   Monocytes Relative 10 %   Monocytes  Absolute 1.4 (H) 0.2 - 1.0 K/uL   Eosinophils Relative 0 %   Eosinophils Absolute  0.0 0 - 0.7 K/uL   Basophils Relative 0 %   Basophils Absolute 0.0 0 - 0.1 K/uL  Urinalysis complete, with microscopic     Status: Abnormal   Collection Time: 02/20/15  7:00 AM  Result Value Ref Range   Color, Urine AMBER (A) YELLOW   APPearance CLEAR (A) CLEAR   Glucose, UA NEGATIVE NEGATIVE mg/dL   Bilirubin Urine NEGATIVE NEGATIVE   Ketones, ur NEGATIVE NEGATIVE mg/dL   Specific Gravity, Urine 1.018 1.005 - 1.030   Hgb urine dipstick NEGATIVE NEGATIVE   pH 5.0 5.0 - 8.0   Protein, ur 30 (A) NEGATIVE mg/dL   Nitrite NEGATIVE NEGATIVE   Leukocytes, UA NEGATIVE NEGATIVE   RBC / HPF 0-5 0 - 5 RBC/hpf   WBC, UA 0-5 0 - 5 WBC/hpf   Bacteria, UA RARE (A) NONE SEEN   Squamous Epithelial / LPF 0-5 (A) NONE SEEN   Mucous PRESENT    Hyaline Casts, UA PRESENT   Troponin I     Status: Abnormal   Collection Time: 02/20/15  9:06 AM  Result Value Ref Range   Troponin I 0.04 (H) <0.031 ng/mL   Dg Chest 2 View  02/20/2015   CLINICAL DATA:  Fever, weakness, and difficulty breathing for 2 days.  EXAM: CHEST  2 VIEW  COMPARISON:  07/14/2014  FINDINGS: Cardiac enlargement. Normal pulmonary vascularity. Calcified and tortuous aorta. Mediastinal contours appear intact. Infiltration in the left lower lung with small bilateral pleural effusions. Changes likely to represent pneumonia. No pneumothorax.  IMPRESSION: Infiltration in the right lung base with small bilateral pleural effusions. Likely pneumonia.   Electronically Signed   By: Burman Nieves M.D.   On: 02/20/2015 03:33     ASSESSMENT AND PLAN: Mildly elevated troponin with no chest pain and no significant EKG changes. Patient does have a history of PCI and stenting, but is a hospice patient  and DO NOT RESUSCITATE. Advise conservative treatment.  Iley Breeden A

## 2015-02-20 NOTE — Progress Notes (Signed)
*  PRELIMINARY RESULTS* Echocardiogram 2D Echocardiogram has been performed.  Garrel Ridgel Stills 02/20/2015, 8:29 PM

## 2015-02-20 NOTE — Progress Notes (Signed)
Patient presently resting in the bed, denies any pain at this time, VSS, , family at bedside

## 2015-02-21 LAB — CBC
HEMATOCRIT: 32.5 % — AB (ref 40.0–52.0)
HEMOGLOBIN: 10.3 g/dL — AB (ref 13.0–18.0)
MCH: 25.5 pg — ABNORMAL LOW (ref 26.0–34.0)
MCHC: 31.6 g/dL — AB (ref 32.0–36.0)
MCV: 80.7 fL (ref 80.0–100.0)
Platelets: 237 10*3/uL (ref 150–440)
RBC: 4.03 MIL/uL — AB (ref 4.40–5.90)
RDW: 18.6 % — ABNORMAL HIGH (ref 11.5–14.5)
WBC: 11.7 10*3/uL — ABNORMAL HIGH (ref 3.8–10.6)

## 2015-02-21 LAB — GLUCOSE, CAPILLARY
Glucose-Capillary: 167 mg/dL — ABNORMAL HIGH (ref 65–99)
Glucose-Capillary: 243 mg/dL — ABNORMAL HIGH (ref 65–99)

## 2015-02-21 LAB — POTASSIUM
POTASSIUM: 3 mmol/L — AB (ref 3.5–5.1)
POTASSIUM: 3.5 mmol/L (ref 3.5–5.1)

## 2015-02-21 LAB — BASIC METABOLIC PANEL
ANION GAP: 10 (ref 5–15)
BUN: 35 mg/dL — ABNORMAL HIGH (ref 6–20)
CALCIUM: 8.4 mg/dL — AB (ref 8.9–10.3)
CO2: 32 mmol/L (ref 22–32)
Chloride: 94 mmol/L — ABNORMAL LOW (ref 101–111)
Creatinine, Ser: 1.9 mg/dL — ABNORMAL HIGH (ref 0.61–1.24)
GFR, EST AFRICAN AMERICAN: 39 mL/min — AB (ref >60)
GFR, EST NON AFRICAN AMERICAN: 34 mL/min — AB (ref >60)
GLUCOSE: 137 mg/dL — AB (ref 65–99)
POTASSIUM: 2.7 mmol/L — AB (ref 3.5–5.1)
Sodium: 136 mmol/L (ref 135–145)

## 2015-02-21 LAB — HEMOGLOBIN A1C: Hgb A1c MFr Bld: 6.3 % — ABNORMAL HIGH (ref 4.0–6.0)

## 2015-02-21 MED ORDER — AZITHROMYCIN 250 MG PO TABS: ORAL_TABLET | ORAL | Status: DC

## 2015-02-21 MED ORDER — ENOXAPARIN SODIUM 40 MG/0.4ML ~~LOC~~ SOLN: 40.0000 mg | Freq: Every day | SUBCUTANEOUS | Status: DC

## 2015-02-21 MED ORDER — POTASSIUM CHLORIDE CRYS ER 20 MEQ PO TBCR: 40.0000 meq | EXTENDED_RELEASE_TABLET | Freq: Two times a day (BID) | ORAL | Status: DC

## 2015-02-21 MED ORDER — POTASSIUM CHLORIDE CRYS ER 20 MEQ PO TBCR
40.0000 meq | EXTENDED_RELEASE_TABLET | Freq: Once | ORAL | Status: AC
  Administered 2015-02-21: 40 meq via ORAL
  Filled 2015-02-21: qty 2

## 2015-02-21 MED ORDER — AZITHROMYCIN 250 MG PO TABS: 500.0000 mg | ORAL_TABLET | Freq: Every day | ORAL | Status: DC

## 2015-02-21 MED ORDER — POTASSIUM CHLORIDE CRYS ER 20 MEQ PO TBCR
40.0000 meq | EXTENDED_RELEASE_TABLET | Freq: Two times a day (BID) | ORAL | Status: DC
  Administered 2015-02-21: 40 meq via ORAL
  Filled 2015-02-21: qty 2

## 2015-02-21 MED ORDER — CEPHALEXIN 500 MG PO CAPS
500.0000 mg | ORAL_CAPSULE | Freq: Two times a day (BID) | ORAL | Status: DC
  Administered 2015-02-21: 500 mg via ORAL
  Filled 2015-02-21: qty 1

## 2015-02-21 MED ORDER — CEPHALEXIN 500 MG PO CAPS: 500.0000 mg | ORAL_CAPSULE | Freq: Two times a day (BID) | ORAL | Status: DC

## 2015-02-21 NOTE — Progress Notes (Signed)
SUBJECTIVE: Pt feeling better, no CP. SOB improved    Filed Vitals:   02/20/15 1132 02/20/15 1927 02/21/15 0406 02/21/15 1125  BP: 142/72 147/74 145/78 126/62  Pulse: 55 68 72 69  Temp: 97.9 F (36.6 C) 97.7 F (36.5 C) 97.9 F (36.6 C) 97.9 F (36.6 C)  TempSrc: Oral Oral Oral Oral  Resp: 17 18 20 17   Height:      Weight:   95.21 kg (209 lb 14.4 oz)   SpO2: 100% 100% 98% 98%    Intake/Output Summary (Last 24 hours) at 02/21/15 1458 Last data filed at 02/21/15 1200  Gross per 24 hour  Intake    967 ml  Output   1425 ml  Net   -458 ml    LABS: Basic Metabolic Panel:  Recent Labs  40/98/11 0251 02/20/15 1622  02/21/15 0359 02/21/15 0744 02/21/15 1349  NA 135  --   --  136  --   --   K 2.4*  --   < > 2.7* 3.0* 3.5  CL 93*  --   --  94*  --   --   CO2 30  --   --  32  --   --   GLUCOSE 220*  --   --  137*  --   --   BUN 28*  --   --  35*  --   --   CREATININE 2.18*  --   --  1.90*  --   --   CALCIUM 8.3*  --   --  8.4*  --   --   MG  --  2.3  --   --   --   --   < > = values in this interval not displayed. Liver Function Tests:  Recent Labs  02/20/15 0251  AST 20  ALT 7*  ALKPHOS 92  BILITOT 1.0  PROT 6.4*  ALBUMIN 3.0*   No results for input(s): LIPASE, AMYLASE in the last 72 hours. CBC:  Recent Labs  02/20/15 0251 02/21/15 0359  WBC 13.8* 11.7*  NEUTROABS 11.7*  --   HGB 11.2* 10.3*  HCT 34.9* 32.5*  MCV 80.1 80.7  PLT 244 237   Cardiac Enzymes:  Recent Labs  02/20/15 0906 02/20/15 1622 02/20/15 2138  TROPONINI 0.04* 0.12* 0.04*   BNP: Invalid input(s): POCBNP D-Dimer: No results for input(s): DDIMER in the last 72 hours. Hemoglobin A1C: No results for input(s): HGBA1C in the last 72 hours. Fasting Lipid Panel: No results for input(s): CHOL, HDL, LDLCALC, TRIG, CHOLHDL, LDLDIRECT in the last 72 hours. Thyroid Function Tests: No results for input(s): TSH, T4TOTAL, T3FREE, THYROIDAB in the last 72 hours.  Invalid input(s):  FREET3 Anemia Panel: No results for input(s): VITAMINB12, FOLATE, FERRITIN, TIBC, IRON, RETICCTPCT in the last 72 hours.   PHYSICAL EXAM General: Well developed, well nourished, in no acute distress HEENT:  Normocephalic and atramatic Neck:  No JVD.  Lungs: Clear bilaterally to auscultation and percussion. Heart: HRRR . Normal S1 and S2 without gallops or murmurs.  Abdomen: Bowel sounds are positive, abdomen soft and non-tender  Msk:  Back normal, normal gait. Normal strength and tone for age. Extremities: 1+ pedal edema b/l   Neuro: Alert and oriented X 3. Psych:  Good affect, responds appropriately  TELEMETRY: Reviewed telemetry pt in: NSR  ASSESSMENT AND PLAN: Chronic systolic CHF: continue current medication regimen. Pt has refused to be seen in office multiple times since January for medical management. Pt  may f/u in office if he is agreeable.    Patient and plan discussed with supervising provider, Dr. Adrian Blackwater, who agrees with above findings.   Alinda Sierras Margarito Courser [facility]  02/21/2015 2:58 PM

## 2015-02-21 NOTE — Progress Notes (Signed)
Patients wife called back to hospital saying that prescriptions were not at their pharmacy. RN checked pharmacy listed, which is a mail order service. Patients' wife reports they have never used this service. RN obtained correct pharmacy information ([facility], [place], [place]), updated it in his chart and paged Dr. Allena Katz with the phone number so she can call them in.

## 2015-02-21 NOTE — Progress Notes (Signed)
Pt. Discharged to home to resume hospice care via wc. Discharge instructions and medication regimen reviewed at bedside with patient and wife. Both verbalize understanding of instructions and medication regimen. Patient assessment unchanged from this morning. TELE and IV discontinued per policy.

## 2015-02-21 NOTE — Care Management (Signed)
Called and faxed discharge information to [facility]

## 2015-02-21 NOTE — Discharge Summary (Signed)
[facility] [place] at [facility]   PATIENT NAME: Larry Lawrence    MR#:  [id number]  DATE OF BIRTH:  01-Aug-1940  DATE OF ADMISSION:  02/20/2015 ADMITTING PHYSICIAN: Crissie Figures, MD  DATE OF DISCHARGE: 02/21/2015  PRIMARY CARE PHYSICIAN: No primary care provider on file.    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 CHF (congestive heart failure)   . Renal disorder   . Coronary artery disease     HOSPITAL COURSE:   74 year old Caucasian male with history of chronic kidney disease, congestive heart failure on home oxygen 3 L/m, coronary artery disease status post stent, under care of hospice at home, DO NOT RESUSCITATE status presents to the emergency room with the complaints of three-day history of fever, dry cough, generalized weakness and shortness of breath. 1. Right lower lobe pneumonia, community-acquired. - continue IV antibiotics-azithromycin and ceftriaxone, O2 supplementation, follow-up O2 saturations, follow-up CBC, blood cultures and sputum cultures. -BC neg -afebrile. Doing well. Change to po abxs  2. Hypokalemia secondary to poor oral intake. -repleting  3. Mildly elevated troponin, likely related to CK D. Patient without any chest pain, EKG no acute ischemic changes.  4. CK D, creatinine 2.18. Monitor BMP, avoid nephrotoxic agents. -creat 1.9  5. Coronary artery disease status post stent. No chest pain. Echo shows EF 30-35%  Overall well and improving. Ok to go home with resuming Hospice services at  home DISCHARGE CONDITIONS:   fair CONSULTS OBTAINED:  Treatment Team:  Laurier Nancy, MD  DRUG ALLERGIES:   Allergies  Allergen Reactions  . Niaspan [Niacin Er] Other (See Comments)    Hot flashes    DISCHARGE MEDICATIONS:    Current Discharge Medication List    START taking these medications   Details  azithromycin (ZITHROMAX) 250 MG tablet Take for 4 more days Qty: 4 each, Refills: 0    cephALEXin (KEFLEX) 500 MG capsule Take 1 capsule (500 mg total) by mouth 2 (two) times daily. Qty: 10 capsule, Refills: 0      CONTINUE these medications which have NOT CHANGED   Details  acetaminophen (TYLENOL) 500 MG tablet Take 500 mg by mouth every 6 (six) hours as needed.    allopurinol (ZYLOPRIM) 300 MG tablet Take 1 tablet by mouth 2 (two) times daily.    amiodarone (PACERONE) 200 MG tablet Take 200 mg by mouth daily.    aspirin 325 MG tablet Take 325 mg by mouth daily.    carvedilol (COREG) 25 MG tablet Take 25 mg by mouth 2 (two) times daily with a meal.    furosemide (LASIX) 40 MG tablet Take 80 mg by mouth daily.    HYDROcodone-acetaminophen (NORCO/VICODIN) 5-325 MG per tablet Take 1 tablet by mouth every 4 (four) hours as needed for moderate pain.    ibuprofen (ADVIL,MOTRIN) 200 MG tablet Take 200 mg by mouth every 6 (six) hours as needed.    isosorbide mononitrate (IMDUR) 30 MG 24 hr tablet Take 30 mg by mouth daily.    losartan (COZAAR) 25 MG tablet Take 25 mg by mouth daily.    potassium chloride SA (K-DUR,KLOR-CON) 20 MEQ tablet Take 1 tablet by mouth daily.    timolol (TIMOPTIC) 0.5 % ophthalmic solution Place 1 drop into both eyes daily.    traZODone (DESYREL) 50 MG tablet Take 50 mg  by mouth at bedtime.      STOP taking these medications     amoxicillin-clavulanate (AUGMENTIN) 875-125 MG per tablet         If you experience worsening of your admission symptoms, develop shortness of breath, life threatening emergency, suicidal or homicidal thoughts you must seek medical attention immediately by calling 911 or calling your MD immediately  if symptoms less severe.  You Must read complete instructions/literature along with all the possible adverse reactions/side effects for all the  Medicines you take and that have been prescribed to you. Take any new Medicines after you have completely understood and accept all the possible adverse reactions/side effects.   Please note  You were cared for by a hospitalist during your hospital stay. If you have any questions about your discharge medications or the care you received while you were in the hospital after you are discharged, you can call the unit and asked to speak with the hospitalist on call if the hospitalist that took care of you is not available. Once you are discharged, your primary care physician will handle any further medical issues. Please note that NO REFILLS for any discharge medications will be authorized once you are discharged, as it is imperative that you return to your primary care physician (or establish a relationship with a primary care physician if you do not have one) for your aftercare needs so that they can reassess your need for medications and monitor your lab values.  DATA REVIEW:   CBC   Recent Labs Lab 02/21/15 0359  WBC 11.7*  HGB 10.3*  HCT 32.5*  PLT 237    Chemistries   Recent Labs Lab 02/20/15 0251 02/20/15 1622  02/21/15 0359  02/21/15 1349  NA 135  --   --  136  --   --   K 2.4*  --   < > 2.7*  < > 3.5  CL 93*  --   --  94*  --   --   CO2 30  --   --  32  --   --   GLUCOSE 220*  --   --  137*  --   --   BUN 28*  --   --  35*  --   --   CREATININE 2.18*  --   --  1.90*  --   --   CALCIUM 8.3*  --   --  8.4*  --   --   MG  --  2.3  --   --   --   --   AST 20  --   --   --   --   --   ALT 7*  --   --   --   --   --   ALKPHOS 92  --   --   --   --   --   BILITOT 1.0  --   --   --   --   --   < > = values in this interval not displayed.  Microbiology Results   No results found for this or any previous visit (from the past 240 hour(s)).  RADIOLOGY:  Dg Chest 2 View  02/20/2015   CLINICAL DATA:  Fever, weakness, and difficulty breathing for 2 days.  EXAM: CHEST  2 VIEW   COMPARISON:  07/14/2014  FINDINGS: Cardiac enlargement. Normal pulmonary vascularity. Calcified and tortuous aorta. Mediastinal contours appear intact. Infiltration in the left lower lung with small bilateral  pleural effusions. Changes likely to represent pneumonia. No pneumothorax.  IMPRESSION: Infiltration in the right lung base with small bilateral pleural effusions. Likely pneumonia.   Electronically Signed   By: Burman Nieves M.D.   On: 03/06/2015 03:33     Management plans discussed with the patient, family and they are in agreement.  CODE STATUS:     Code Status Orders        Start     Ordered   2015/03/06 [phone]  Do not attempt resuscitation (DNR)   Continuous    Question Answer Comment  In the event of cardiac or respiratory ARREST Do not call a "code blue"   In the event of cardiac or respiratory ARREST Do not perform Intubation, CPR, defibrillation or ACLS   In the event of cardiac or respiratory ARREST Use medication by any route, position, wound care, and other measures to relive pain and suffering. May use oxygen, suction and manual treatment of airway obstruction as needed for comfort.   Comments RN may pronounce death      03-06-15 [id number]    Advance Directive Documentation        Most Recent Value   Type of Advance Directive  Out of facility DNR (pink MOST or yellow form)   Pre-existing out of facility DNR order (yellow form or pink MOST form)  Yellow form placed in chart (order not valid for inpatient use)   "MOST" Form in Place?        TOTAL TIME TAKING CARE OF THIS PATIENT: 40 minutes.    Oda Lansdowne M.D on 02/21/2015 at 2:45 PM  Between 7am to 6pm - Pager - [phone] After 6pm go to [email or web address] - password [email or web address] [facility]  [facility]  Office  [phone]  CC: Primary care physician; No primary care provider on file.

## 2015-02-21 NOTE — Progress Notes (Signed)
Patient's potassium level is 2.7 this morning, Dr. Betti Cruz notified with new order to give the  40 mEq scheduled for this morning and repeat the lab in 4 hours. No acute distress noted. Patient denied pain.

## 2015-02-21 NOTE — Care Management (Signed)
Patient presents from home from home with fever and found to have pneumonia.  He is followed by [facility] for cardiac issues.  He does have chronic home 02 and agency is aware of admission.  Patient does have DNR in place

## 2015-02-21 NOTE — Discharge Instructions (Signed)
Pneumonia, Adult Pneumonia is an infection of the lungs. It may be caused by a germ (virus or bacteria). Some types of pneumonia can spread easily from person to person. This can happen when you cough or sneeze. HOME CARE  Only take medicine as told by your doctor.  Take your medicine (antibiotics) as told. Finish it even if you start to feel better.  Do not smoke.  You may use a vaporizer or humidifier in your room. This can help loosen thick spit (mucus).  Sleep so you are almost sitting up (semi-upright). This helps reduce coughing.  Rest. A shot (vaccine) can help prevent pneumonia. Shots are often advised for:  People over 67 years old.  Patients on chemotherapy.  People with long-term (chronic) lung problems.  People with immune system problems. GET HELP RIGHT AWAY IF:   You are getting worse.  You cannot control your cough, and you are losing sleep.  You cough up blood.  Your pain gets worse, even with medicine.  You have a fever.  Any of your problems are getting worse, not better.  You have shortness of breath or chest pain. MAKE SURE YOU:   Understand these instructions.  Will watch your condition.  Will get help right away if you are not doing well or get worse. Document Released: 12/04/2007 Document Revised: 09/09/2011 Document Reviewed: 09/07/2010 [facility] Patient Information 2015 [place], [place]. This information is not intended to replace advice given to you by your health care provider. Make sure you discuss any questions you have with your health care provider.  Amoxicillin; Clavulanic Acid tablets What is this medicine? AMOXICILLIN; CLAVULANIC ACID (a mox i SIL in; KLAV yoo lan ic AS id) is a penicillin antibiotic. It is used to treat certain kinds of bacterial infections. It will not work for colds, flu, or other viral infections. This medicine may be used for other purposes; ask your health care provider or pharmacist if you have  questions. COMMON BRAND NAME(S): Augmentin What should I tell my health care provider before I take this medicine? They need to know if you have any of these conditions: -bowel disease, like colitis -kidney disease -liver disease -mononucleosis -an unusual or allergic reaction to amoxicillin, penicillin, cephalosporin, other antibiotics, clavulanic acid, other medicines, foods, dyes, or preservatives -pregnant or trying to get pregnant -breast-feeding How should I use this medicine? Take this medicine by mouth with a full glass of water. Follow the directions on the prescription label. Take at the start of a meal. Do not crush or chew. If the tablet has a score line, you may cut it in half at the score line for easier swallowing. Take your medicine at regular intervals. Do not take your medicine more often than directed. Take all of your medicine as directed even if you think you are better. Do not skip doses or stop your medicine early. Talk to your pediatrician regarding the use of this medicine in children. Special care may be needed. Overdosage: If you think you have taken too much of this medicine contact a poison control center or emergency room at once. NOTE: This medicine is only for you. Do not share this medicine with others. What if I miss a dose? If you miss a dose, take it as soon as you can. If it is almost time for your next dose, take only that dose. Do not take double or extra doses. What may interact with this medicine? -allopurinol -anticoagulants -birth control pills -methotrexate -probenecid This list may  not describe all possible interactions. Give your health care provider a list of all the medicines, herbs, non-prescription drugs, or dietary supplements you use. Also tell them if you smoke, drink alcohol, or use illegal drugs. Some items may interact with your medicine. What should I watch for while using this medicine? Tell your doctor or health care professional if  your symptoms do not improve. Do not treat diarrhea with over the counter products. Contact your doctor if you have diarrhea that lasts more than 2 days or if it is severe and watery. If you have diabetes, you may get a false-positive result for sugar in your urine. Check with your doctor or health care professional. Birth control pills may not work properly while you are taking this medicine. Talk to your doctor about using an extra method of birth control. What side effects may I notice from receiving this medicine? Side effects that you should report to your doctor or health care professional as soon as possible: -allergic reactions like skin rash, itching or hives, swelling of the face, lips, or tongue -breathing problems -dark urine -fever or chills, sore throat -redness, blistering, peeling or loosening of the skin, including inside the mouth -seizures -trouble passing urine or change in the amount of urine -unusual bleeding, bruising -unusually weak or tired -white patches or sores in the mouth or throat Side effects that usually do not require medical attention (report to your doctor or health care professional if they continue or are bothersome): -diarrhea -dizziness -headache -nausea, vomiting -stomach upset -vaginal or anal irritation This list may not describe all possible side effects. Call your doctor for medical advice about side effects. You may report side effects to FDA at 1-800-FDA-1088. Where should I keep my medicine? Keep out of the reach of children. Store at room temperature below 25 degrees C (77 degrees F). Keep container tightly closed. Throw away any unused medicine after the expiration date. NOTE: This sheet is a summary. It may not cover all possible information. If you have questions about this medicine, talk to your doctor, pharmacist, or health care provider.  2015, Elsevier/Gold Standard. (2007-09-10 12:04:30)  Azithromycin tablets What is this  medicine? AZITHROMYCIN (az ith roe MYE sin) is a macrolide antibiotic. It is used to treat or prevent certain kinds of bacterial infections. It will not work for colds, flu, or other viral infections. This medicine may be used for other purposes; ask your health care provider or pharmacist if you have questions. COMMON BRAND NAME(S): Zithromax, Zithromax Tri-Pak, Zithromax Z-Pak What should I tell my health care provider before I take this medicine? They need to know if you have any of these conditions: -kidney disease -liver disease -irregular heartbeat or heart disease -an unusual or allergic reaction to azithromycin, erythromycin, other macrolide antibiotics, foods, dyes, or preservatives -pregnant or trying to get pregnant -breast-feeding How should I use this medicine? Take this medicine by mouth with a full glass of water. Follow the directions on the prescription label. The tablets can be taken with food or on an empty stomach. If the medicine upsets your stomach, take it with food. Take your medicine at regular intervals. Do not take your medicine more often than directed. Take all of your medicine as directed even if you think your are better. Do not skip doses or stop your medicine early. Talk to your pediatrician regarding the use of this medicine in children. Special care may be needed. Overdosage: If you think you have taken too much of  this medicine contact a poison control center or emergency room at once. NOTE: This medicine is only for you. Do not share this medicine with others. What if I miss a dose? If you miss a dose, take it as soon as you can. If it is almost time for your next dose, take only that dose. Do not take double or extra doses. What may interact with this medicine? Do not take this medicine with any of the following medications: -lincomycin This medicine may also interact with the following medications: -amiodarone -antacids -birth control  pills -cyclosporine -digoxin -magnesium -nelfinavir -phenytoin -warfarin This list may not describe all possible interactions. Give your health care provider a list of all the medicines, herbs, non-prescription drugs, or dietary supplements you use. Also tell them if you smoke, drink alcohol, or use illegal drugs. Some items may interact with your medicine. What should I watch for while using this medicine? Tell your doctor or health care professional if your symptoms do not improve. Do not treat diarrhea with over the counter products. Contact your doctor if you have diarrhea that lasts more than 2 days or if it is severe and watery. This medicine can make you more sensitive to the sun. Keep out of the sun. If you cannot avoid being in the sun, wear protective clothing and use sunscreen. Do not use sun lamps or tanning beds/booths. What side effects may I notice from receiving this medicine? Side effects that you should report to your doctor or health care professional as soon as possible: -allergic reactions like skin rash, itching or hives, swelling of the face, lips, or tongue -confusion, nightmares or hallucinations -dark urine -difficulty breathing -hearing loss -irregular heartbeat or chest pain -pain or difficulty passing urine -redness, blistering, peeling or loosening of the skin, including inside the mouth -white patches or sores in the mouth -yellowing of the eyes or skin Side effects that usually do not require medical attention (report to your doctor or health care professional if they continue or are bothersome): -diarrhea -dizziness, drowsiness -headache -stomach upset or vomiting -tooth discoloration -vaginal irritation This list may not describe all possible side effects. Call your doctor for medical advice about side effects. You may report side effects to FDA at 1-800-FDA-1088. Where should I keep my medicine? Keep out of the reach of children. Store at room  temperature between 15 and 30 degrees C (59 and 86 degrees F). Throw away any unused medicine after the expiration date. NOTE: This sheet is a summary. It may not cover all possible information. If you have questions about this medicine, talk to your doctor, pharmacist, or health care provider.  2015, Elsevier/Gold Standard. (2013-01-21 15:38:48)  Cephalexin tablets or capsules What is this medicine? CEPHALEXIN (sef a LEX in) is a cephalosporin antibiotic. It is used to treat certain kinds of bacterial infections It will not work for colds, flu, or other viral infections. This medicine may be used for other purposes; ask your health care provider or pharmacist if you have questions. COMMON BRAND NAME(S): Biocef, Keflex, Keftab What should I tell my health care provider before I take this medicine? They need to know if you have any of these conditions: -kidney disease -stomach or intestine problems, especially colitis -an unusual or allergic reaction to cephalexin, other cephalosporins, penicillins, other antibiotics, medicines, foods, dyes or preservatives -pregnant or trying to get pregnant -breast-feeding How should I use this medicine? Take this medicine by mouth with a full glass of water. Follow the directions on  the prescription label. This medicine can be taken with or without food. Take your medicine at regular intervals. Do not take your medicine more often than directed. Take all of your medicine as directed even if you think you are better. Do not skip doses or stop your medicine early. Talk to your pediatrician regarding the use of this medicine in children. While this drug may be prescribed for selected conditions, precautions do apply. Overdosage: If you think you have taken too much of this medicine contact a poison control center or emergency room at once. NOTE: This medicine is only for you. Do not share this medicine with others. What if I miss a dose? If you miss a dose,  take it as soon as you can. If it is almost time for your next dose, take only that dose. Do not take double or extra doses. There should be at least 4 to 6 hours between doses. What may interact with this medicine? -probenecid -some other antibiotics This list may not describe all possible interactions. Give your health care provider a list of all the medicines, herbs, non-prescription drugs, or dietary supplements you use. Also tell them if you smoke, drink alcohol, or use illegal drugs. Some items may interact with your medicine. What should I watch for while using this medicine? Tell your doctor or health care professional if your symptoms do not begin to improve in a few days. Do not treat diarrhea with over the counter products. Contact your doctor if you have diarrhea that lasts more than 2 days or if it is severe and watery. If you have diabetes, you may get a false-positive result for sugar in your urine. Check with your doctor or health care professional. What side effects may I notice from receiving this medicine? Side effects that you should report to your doctor or health care professional as soon as possible: -allergic reactions like skin rash, itching or hives, swelling of the face, lips, or tongue -breathing problems -pain or trouble passing urine -redness, blistering, peeling or loosening of the skin, including inside the mouth -severe or watery diarrhea -unusually weak or tired -yellowing of the eyes, skin Side effects that usually do not require medical attention (report to your doctor or health care professional if they continue or are bothersome): -gas or heartburn -genital or anal irritation -headache -joint or muscle pain -nausea, vomiting This list may not describe all possible side effects. Call your doctor for medical advice about side effects. You may report side effects to FDA at 1-800-FDA-1088. Where should I keep my medicine? Keep out of the reach of  children. Store at room temperature between 59 and 86 degrees F (15 and 30 degrees C). Throw away any unused medicine after the expiration date. NOTE: This sheet is a summary. It may not cover all possible information. If you have questions about this medicine, talk to your doctor, pharmacist, or health care provider.  2015, Elsevier/Gold Standard. (2007-09-21 17:09:13)   RESUME HOSPICE SERVICES AT DISCHARGE

## 2015-02-21 NOTE — Progress Notes (Signed)
ANTIBIOTIC CONSULT NOTE - INITIAL  Pharmacy Consult for Ceftriaxone Dosing Indication: pneumonia  Allergies  Allergen Reactions  . Niaspan [Niacin Er] Other (See Comments)    Hot flashes    Patient Measurements: Height:  (180.3 cm) Weight: 209 lb 14.4 oz (95.21 kg) IBW/kg (Calculated) : 75.3   Vital Signs: Temp: 97.9 F (36.6 C) (08/23 0406) Temp Source: Oral (08/23 0406) BP: 145/78 mmHg (08/23 0406) Pulse Rate: 72 (08/23 0406) Intake/Output from previous day: 08/22 0701 - 08/23 0700 In: 480 [P.O.:480] Out: 1525 [Urine:1525] Intake/Output from this shift:    Labs:  Recent Labs  02/20/15 0251 02/21/15 0359  WBC 13.8* 11.7*  HGB 11.2* 10.3*  PLT 244 237  CREATININE 2.18* 1.90*   Estimated Creatinine Clearance: 41.4 mL/min (by C-G formula based on Cr of 1.9). No results for input(s): VANCOTROUGH, VANCOPEAK, VANCORANDOM, GENTTROUGH, GENTPEAK, GENTRANDOM, TOBRATROUGH, TOBRAPEAK, TOBRARND, AMIKACINPEAK, AMIKACINTROU, AMIKACIN in the last 72 hours.   Microbiology: No results found for this or any previous visit (from the past 720 hour(s)).  Medical History: Past Medical History  Diagnosis Date  . CHF (congestive heart failure)   . Renal disorder   . Coronary artery disease     Medications:  Scheduled:  . allopurinol  300 mg Oral BID  . amiodarone  200 mg Oral Daily  . aspirin  325 mg Oral Daily  . azithromycin  500 mg Intravenous Q24H  . carvedilol  25 mg Oral BID WC  . feeding supplement (ENSURE ENLIVE)  237 mL Oral BID BM  . furosemide  80 mg Oral Daily  . heparin  5,000 Units Subcutaneous 3 times per day  . insulin aspart  0-9 Units Subcutaneous TID WC  . isosorbide mononitrate  30 mg Oral Daily  . losartan  25 mg Oral Daily  . potassium chloride SA  20 mEq Oral Daily  . potassium chloride  40 mEq Oral BID  . sodium chloride  3 mL Intravenous Q12H  . timolol  1 drop Both Eyes Daily  . traZODone  50 mg Oral QHS   Assessment: Patient is a 74  yo male admitted for CAP. Patient currently ordered Azithromycin 500 mg IV q24h and Ceftriaxone 1 gm IV q24h.  Goal of Therapy:  Resolution of infection  Plan: Continue current orders for Ceftriaxone 1 gm IV q24h.  Follow up culture results  Larry Lawrence G 02/21/2015,9:54 AM

## 2015-02-21 NOTE — Progress Notes (Addendum)
Electrolyte Supplementation - INITIAL   Pharmacy Consult for Electrolyte Supplementaion Indication: Hypokalemia  Allergies  Allergen Reactions  . Niaspan [Niacin Er] Other (See Comments)    Hot flashes    Patient Measurements: Height:  (180.3 cm) Weight: 210 lb 14.4 oz (95.664 kg) IBW/kg (Calculated) : 75.3   Vital Signs: Temp: 97.7 F (36.5 C) (08/22 1927) Temp Source: Oral (08/22 1927) BP: 147/74 mmHg (08/22 1927) Pulse Rate: 68 (08/22 1927) Intake/Output from previous day: 08/22 0701 - 08/23 0700 In: 480 [P.O.:480] Out: 975 [Urine:975] Intake/Output from this shift: Total I/O In: -  Out: 475 [Urine:475]  Labs:  Recent Labs  02/20/15 0251 02/20/15 1622  WBC 13.8*  --   HGB 11.2*  --   HCT 34.9*  --   PLT 244  --   CREATININE 2.18*  --   MG  --  2.3  ALBUMIN 3.0*  --   PROT 6.4*  --   AST 20  --   ALT 7*  --   ALKPHOS 92  --   BILITOT 1.0  --    Estimated Creatinine Clearance: 36.2 mL/min (by C-G formula based on Cr of 2.18).   Microbiology: No results found for this or any previous visit (from the past 720 hour(s)).  Medical History: Past Medical History  Diagnosis Date  . CHF (congestive heart failure)   . Renal disorder   . Coronary artery disease     Medications:  Scheduled:  . allopurinol  300 mg Oral BID  . amiodarone  200 mg Oral Daily  . aspirin  325 mg Oral Daily  . azithromycin  500 mg Intravenous Q24H  . carvedilol  25 mg Oral BID WC  . feeding supplement (ENSURE ENLIVE)  237 mL Oral BID BM  . furosemide  80 mg Oral Daily  . heparin  5,000 Units Subcutaneous 3 times per day  . insulin aspart  0-9 Units Subcutaneous TID WC  . isosorbide mononitrate  30 mg Oral Daily  . losartan  25 mg Oral Daily  . potassium chloride SA  20 mEq Oral Daily  . potassium chloride  40 mEq Oral Once  . sodium chloride  3 mL Intravenous Q12H  . timolol  1 drop Both Eyes Daily  . traZODone  50 mg Oral QHS    Assessment: Pharmacy consulted  to supplement potassium and magnesium in a 74 yo male admitted for CAP.   K: 2.4, Mag: pending  Goal of Therapy:  Normalize magnesium and potassium  Plan:  Patient has received Potassium chloride 20 meq IV once and oral potassium 20 meq.  Will continue current orders for potassium 20 meq daily.  Will order an additional potassium chloride 40 meq PO once and recheck potassium at 1900 to assess for further supplementation .  Magnesium level pending as add on lab.   Pharmacy will continue to follow.  8/22 PM K+ 3.0. Additional 40 mEq KCl PO ordered. BMP in AM.  Mg 2.3, WNL.  8/23 AM K+ 2.7. 40 mEq from last night not given. Rescheduled for this AM. RN will add lab 4 hours after dose given.    Fulton Reek, PharmD, BCPS  02/21/2015

## 2015-02-21 NOTE — Progress Notes (Signed)
Electrolyte Supplementation - Follow Up  Pharmacy Consult for Electrolyte Supplementaion Indication: Hypokalemia  Allergies  Allergen Reactions  . Niaspan [Niacin Er] Other (See Comments)    Hot flashes    Patient Measurements: Height:  (180.3 cm) Weight: 209 lb 14.4 oz (95.21 kg) IBW/kg (Calculated) : 75.3   Vital Signs: Temp: 97.9 F (36.6 C) (08/23 1125) Temp Source: Oral (08/23 1125) BP: 126/62 mmHg (08/23 1125) Pulse Rate: 69 (08/23 1125) Intake/Output from previous day: 08/22 0701 - 08/23 0700 In: 480 [P.O.:480] Out: 1525 [Urine:1525] Intake/Output from this shift: Total I/O In: 847 [P.O.:597; IV Piggyback:250] Out: 100 [Urine:100]  Labs:  Recent Labs  02/20/15 0251 02/20/15 1622 02/21/15 0359  WBC 13.8*  --  11.7*  HGB 11.2*  --  10.3*  HCT 34.9*  --  32.5*  PLT 244  --  237  CREATININE 2.18*  --  1.90*  MG  --  2.3  --   ALBUMIN 3.0*  --   --   PROT 6.4*  --   --   AST 20  --   --   ALT 7*  --   --   ALKPHOS 92  --   --   BILITOT 1.0  --   --    Estimated Creatinine Clearance: 41.4 mL/min (by C-G formula based on Cr of 1.9).   Microbiology: No results found for this or any previous visit (from the past 720 hour(s)).  Medical History: Past Medical History  Diagnosis Date  . CHF (congestive heart failure)   . Renal disorder   . Coronary artery disease     Medications:  Scheduled:  . allopurinol  300 mg Oral BID  . amiodarone  200 mg Oral Daily  . aspirin  325 mg Oral Daily  . [START ON 02/22/2015] azithromycin  500 mg Oral Daily  . carvedilol  25 mg Oral BID WC  . cephALEXin  500 mg Oral BID  . enoxaparin (LOVENOX) injection  40 mg Subcutaneous QHS  . feeding supplement (ENSURE ENLIVE)  237 mL Oral BID BM  . furosemide  80 mg Oral Daily  . insulin aspart  0-9 Units Subcutaneous TID WC  . isosorbide mononitrate  30 mg Oral Daily  . losartan  25 mg Oral Daily  . potassium chloride SA  20 mEq Oral Daily  . potassium chloride  40  mEq Oral BID  . sodium chloride  3 mL Intravenous Q12H  . timolol  1 drop Both Eyes Daily  . traZODone  50 mg Oral QHS    Assessment: Pharmacy consulted to supplement potassium and magnesium in a 74 yo male admitted for CAP.   K: 3.0 (9604), Mag: 2.3 (8/22)  F/U K at 1349: 3.5  Goal of Therapy:  Normalize magnesium and potassium  Plan:  Patient has received Potassium chloride 40 meq once and currently has orders for Potassium chloride 40 meq BID. Potassium level just WNL.  Will continue current orders for Potassium Chloride 40 meq BID as patient is also receiving Furosemide 80 mg daily.  Will order a follow up potassium and magnesium levels in AM.  Pharmacy will continue to follow.  Clarisa Schools, PharmD Clinical Pharmacist 02/21/2015

## 2015-08-09 ENCOUNTER — Emergency Department: Payer: Medicare Other

## 2015-08-09 ENCOUNTER — Observation Stay
Admission: EM | Admit: 2015-08-09 | Discharge: 2015-08-11 | Disposition: A | Discharge status: Home / Self Care | Payer: Medicare Other | Attending: Internal Medicine | Admitting: Internal Medicine

## 2015-08-09 DIAGNOSIS — Z7982 Long term (current) use of aspirin: Secondary | ICD-10-CM | POA: Diagnosis not present

## 2015-08-09 DIAGNOSIS — Z9660 Presence of unspecified orthopedic joint implant: Secondary | ICD-10-CM | POA: Insufficient documentation

## 2015-08-09 DIAGNOSIS — I4581 Long QT syndrome: Secondary | ICD-10-CM | POA: Diagnosis not present

## 2015-08-09 DIAGNOSIS — J90 Pleural effusion, not elsewhere classified: Secondary | ICD-10-CM | POA: Insufficient documentation

## 2015-08-09 DIAGNOSIS — I351 Nonrheumatic aortic (valve) insufficiency: Secondary | ICD-10-CM | POA: Diagnosis not present

## 2015-08-09 DIAGNOSIS — I130 Hypertensive heart and chronic kidney disease with heart failure and stage 1 through stage 4 chronic kidney disease, or unspecified chronic kidney disease: Secondary | ICD-10-CM | POA: Insufficient documentation

## 2015-08-09 DIAGNOSIS — Z87891 Personal history of nicotine dependence: Secondary | ICD-10-CM | POA: Insufficient documentation

## 2015-08-09 DIAGNOSIS — N183 Chronic kidney disease, stage 3 (moderate): Secondary | ICD-10-CM | POA: Insufficient documentation

## 2015-08-09 DIAGNOSIS — R55 Syncope and collapse: Principal | ICD-10-CM | POA: Diagnosis present

## 2015-08-09 DIAGNOSIS — R748 Abnormal levels of other serum enzymes: Secondary | ICD-10-CM | POA: Diagnosis not present

## 2015-08-09 DIAGNOSIS — I447 Left bundle-branch block, unspecified: Secondary | ICD-10-CM | POA: Diagnosis not present

## 2015-08-09 DIAGNOSIS — Z888 Allergy status to other drugs, medicaments and biological substances status: Secondary | ICD-10-CM | POA: Insufficient documentation

## 2015-08-09 DIAGNOSIS — I440 Atrioventricular block, first degree: Secondary | ICD-10-CM | POA: Insufficient documentation

## 2015-08-09 DIAGNOSIS — I509 Heart failure, unspecified: Secondary | ICD-10-CM | POA: Diagnosis not present

## 2015-08-09 DIAGNOSIS — I340 Nonrheumatic mitral (valve) insufficiency: Secondary | ICD-10-CM | POA: Diagnosis not present

## 2015-08-09 DIAGNOSIS — J189 Pneumonia, unspecified organism: Secondary | ICD-10-CM | POA: Diagnosis not present

## 2015-08-09 DIAGNOSIS — R7989 Other specified abnormal findings of blood chemistry: Secondary | ICD-10-CM | POA: Diagnosis not present

## 2015-08-09 DIAGNOSIS — I4891 Unspecified atrial fibrillation: Secondary | ICD-10-CM | POA: Insufficient documentation

## 2015-08-09 DIAGNOSIS — I2510 Atherosclerotic heart disease of native coronary artery without angina pectoris: Secondary | ICD-10-CM | POA: Diagnosis not present

## 2015-08-09 DIAGNOSIS — Z79899 Other long term (current) drug therapy: Secondary | ICD-10-CM | POA: Insufficient documentation

## 2015-08-09 DIAGNOSIS — Z955 Presence of coronary angioplasty implant and graft: Secondary | ICD-10-CM | POA: Diagnosis not present

## 2015-08-09 DIAGNOSIS — I429 Cardiomyopathy, unspecified: Secondary | ICD-10-CM | POA: Diagnosis not present

## 2015-08-09 DIAGNOSIS — E876 Hypokalemia: Secondary | ICD-10-CM | POA: Diagnosis not present

## 2015-08-09 LAB — CBC
HEMATOCRIT: 39.4 % — AB (ref 40.0–52.0)
HEMOGLOBIN: 13 g/dL (ref 13.0–18.0)
MCH: 27 pg (ref 26.0–34.0)
MCHC: 32.9 g/dL (ref 32.0–36.0)
MCV: 82.1 fL (ref 80.0–100.0)
Platelets: 177 10*3/uL (ref 150–440)
RBC: 4.81 MIL/uL (ref 4.40–5.90)
RDW: 18.4 % — ABNORMAL HIGH (ref 11.5–14.5)
WBC: 8 10*3/uL (ref 3.8–10.6)

## 2015-08-09 LAB — COMPREHENSIVE METABOLIC PANEL
ALK PHOS: 83 U/L (ref 38–126)
ALT: 19 U/L (ref 17–63)
AST: 24 U/L (ref 15–41)
Albumin: 4.1 g/dL (ref 3.5–5.0)
Anion gap: 11 (ref 5–15)
BUN: 27 mg/dL — ABNORMAL HIGH (ref 6–20)
CHLORIDE: 93 mmol/L — AB (ref 101–111)
CO2: 33 mmol/L — AB (ref 22–32)
Calcium: 8.9 mg/dL (ref 8.9–10.3)
Creatinine, Ser: 1.82 mg/dL — ABNORMAL HIGH (ref 0.61–1.24)
GFR calc Af Amer: 41 mL/min — ABNORMAL LOW (ref >60)
GFR calc non Af Amer: 35 mL/min — ABNORMAL LOW (ref >60)
Glucose, Bld: 133 mg/dL — ABNORMAL HIGH (ref 65–99)
Potassium: 2.7 mmol/L — CL (ref 3.5–5.1)
SODIUM: 137 mmol/L (ref 135–145)
Total Bilirubin: 1.2 mg/dL (ref 0.3–1.2)
Total Protein: 7.6 g/dL (ref 6.5–8.1)

## 2015-08-09 LAB — POTASSIUM: Potassium: 3 mmol/L — ABNORMAL LOW (ref 3.5–5.1)

## 2015-08-09 LAB — TROPONIN I: Troponin I: 0.07 ng/mL — ABNORMAL HIGH (ref <0.031)

## 2015-08-09 LAB — MAGNESIUM: Magnesium: 1.9 mg/dL (ref 1.7–2.4)

## 2015-08-09 MED ORDER — ONDANSETRON HCL 4 MG PO TABS: 4.0000 mg | ORAL_TABLET | Freq: Four times a day (QID) | ORAL | Status: DC | PRN

## 2015-08-09 MED ORDER — ONDANSETRON HCL 4 MG/2ML IJ SOLN: 4.0000 mg | Freq: Four times a day (QID) | INTRAMUSCULAR | Status: DC | PRN

## 2015-08-09 MED ORDER — HEPARIN SODIUM (PORCINE) 5000 UNIT/ML IJ SOLN
5000.0000 [IU] | Freq: Three times a day (TID) | INTRAMUSCULAR | Status: DC
  Administered 2015-08-09 – 2015-08-11 (×6): 5000 [IU] via SUBCUTANEOUS
  Filled 2015-08-09 (×6): qty 1

## 2015-08-09 MED ORDER — TIMOLOL MALEATE 0.5 % OP SOLN
1.0000 [drp] | Freq: Two times a day (BID) | OPHTHALMIC | Status: DC
  Administered 2015-08-09 – 2015-08-11 (×4): 1 [drp] via OPHTHALMIC
  Filled 2015-08-09: qty 5

## 2015-08-09 MED ORDER — SODIUM CHLORIDE 0.9 % IV SOLN
INTRAVENOUS | Status: DC
  Administered 2015-08-09 – 2015-08-11 (×4): via INTRAVENOUS

## 2015-08-09 MED ORDER — POTASSIUM CHLORIDE CRYS ER 20 MEQ PO TBCR
40.0000 meq | EXTENDED_RELEASE_TABLET | Freq: Once | ORAL | Status: AC
  Administered 2015-08-09: 40 meq via ORAL
  Filled 2015-08-09: qty 2

## 2015-08-09 MED ORDER — AMIODARONE HCL 200 MG PO TABS
200.0000 mg | ORAL_TABLET | Freq: Every day | ORAL | Status: DC
  Administered 2015-08-10: 200 mg via ORAL
  Filled 2015-08-09: qty 1

## 2015-08-09 MED ORDER — SODIUM CHLORIDE 0.9% FLUSH
3.0000 mL | Freq: Two times a day (BID) | INTRAVENOUS | Status: DC
  Administered 2015-08-11: 3 mL via INTRAVENOUS

## 2015-08-09 MED ORDER — ACETAMINOPHEN 325 MG PO TABS: 650.0000 mg | ORAL_TABLET | Freq: Four times a day (QID) | ORAL | Status: DC | PRN

## 2015-08-09 MED ORDER — LOSARTAN POTASSIUM 50 MG PO TABS
50.0000 mg | ORAL_TABLET | Freq: Two times a day (BID) | ORAL | Status: DC
  Administered 2015-08-09 – 2015-08-11 (×4): 50 mg via ORAL
  Filled 2015-08-09 (×4): qty 1

## 2015-08-09 MED ORDER — POTASSIUM CHLORIDE CRYS ER 20 MEQ PO TBCR
20.0000 meq | EXTENDED_RELEASE_TABLET | Freq: Every day | ORAL | Status: DC
  Administered 2015-08-09: 20 meq via ORAL
  Filled 2015-08-09: qty 1

## 2015-08-09 MED ORDER — ISOSORBIDE MONONITRATE ER 30 MG PO TB24
30.0000 mg | ORAL_TABLET | Freq: Every day | ORAL | Status: DC
  Administered 2015-08-10 – 2015-08-11 (×2): 30 mg via ORAL
  Filled 2015-08-09 (×2): qty 1

## 2015-08-09 MED ORDER — OXYCODONE HCL 5 MG PO TABS: 5.0000 mg | ORAL_TABLET | ORAL | Status: DC | PRN

## 2015-08-09 MED ORDER — METOLAZONE 2.5 MG PO TABS
2.5000 mg | ORAL_TABLET | ORAL | Status: DC
  Administered 2015-08-11: 2.5 mg via ORAL
  Filled 2015-08-09: qty 1

## 2015-08-09 MED ORDER — ACETAMINOPHEN 650 MG RE SUPP: 650.0000 mg | Freq: Four times a day (QID) | RECTAL | Status: DC | PRN

## 2015-08-09 MED ORDER — ALLOPURINOL 300 MG PO TABS
300.0000 mg | ORAL_TABLET | Freq: Two times a day (BID) | ORAL | Status: DC
  Administered 2015-08-09 – 2015-08-11 (×4): 300 mg via ORAL
  Filled 2015-08-09 (×6): qty 1

## 2015-08-09 MED ORDER — MORPHINE SULFATE (PF) 2 MG/ML IV SOLN: 2.0000 mg | INTRAVENOUS | Status: DC | PRN

## 2015-08-09 MED ORDER — TRAZODONE HCL 50 MG PO TABS
50.0000 mg | ORAL_TABLET | Freq: Every day | ORAL | Status: DC
  Administered 2015-08-09 – 2015-08-10 (×2): 50 mg via ORAL
  Filled 2015-08-09 (×2): qty 1

## 2015-08-09 MED ORDER — CARVEDILOL 12.5 MG PO TABS
25.0000 mg | ORAL_TABLET | Freq: Two times a day (BID) | ORAL | Status: DC
  Administered 2015-08-10: 25 mg via ORAL
  Filled 2015-08-09: qty 2

## 2015-08-09 MED ORDER — ASPIRIN 325 MG PO TABS
325.0000 mg | ORAL_TABLET | Freq: Every day | ORAL | Status: DC
  Administered 2015-08-10 – 2015-08-11 (×2): 325 mg via ORAL
  Filled 2015-08-09 (×2): qty 1

## 2015-08-09 NOTE — ED Provider Notes (Addendum)
[facility] Emergency Department Provider Note     Time seen: ----------------------------------------- 2:23 PM on 08/09/2015 -----------------------------------------    I have reviewed the triage vital signs and the nursing notes.   HISTORY  Chief Complaint No chief complaint on file.    HPI Larry Lawrence is a 75 y.o. male who presents to ER for near syncopal event. Patient was sitting in his chair when he almost passed out. Patient states he had this happen to him many years ago, but not recently. Denies fevers, chills, chest pain, arrhythmia or other complaints. Currently he feels fine. He denies any recent changes in his medicines.   Past Medical History  Diagnosis Date  . CHF (congestive heart failure)   . Renal disorder   . Coronary artery disease     Patient Active Problem List   Diagnosis Date Noted  . Community acquired pneumonia 02/20/2015  . Hypokalemia 02/20/2015  . Elevated troponin 02/20/2015  . CKD (chronic kidney disease), stage III 02/20/2015  . CHF (congestive heart failure) (HCC) 02/20/2015  . CKD (chronic kidney disease) 02/20/2015    Past Surgical History  Procedure Laterality Date  . Joint replacement    . Cardiac surgery      3 stents in "main artery"    Allergies Niaspan  Social History Social History  Substance Use Topics  . Smoking status: Former Games developer  . Smokeless tobacco: Not on file  . Alcohol Use: No    Review of Systems Constitutional: Negative for fever. Eyes: Negative for visual changes. ENT: Negative for sore throat. Cardiovascular: Negative for chest pain. Respiratory: Negative for shortness of breath. Gastrointestinal: Negative for abdominal pain, vomiting and diarrhea. Genitourinary: Negative for dysuria. Musculoskeletal: Negative for back pain. Skin: Negative for rash. Neurological: Negative for headaches, focal weakness or numbness.  10-point ROS otherwise  negative.  ____________________________________________   PHYSICAL EXAM:  VITAL SIGNS: ED Triage Vitals  Enc Vitals Group     BP 08/09/15 1413 140/85 mmHg     Pulse Rate 08/09/15 1413 57     Resp 08/09/15 1413 16     Temp --      Temp src --      SpO2 08/09/15 1413 96 %     Weight 08/09/15 1413 218 lb (98.884 kg)     Height 08/09/15 1413  (1.803 m)     Head Cir --      Peak Flow --      Pain Score --      Pain Loc --      Pain Edu? --      Excl. in GC? --     Constitutional: Alert and oriented. Well appearing and in no distress. Eyes: Conjunctivae are normal. PERRL. Normal extraocular movements. ENT   Head: Normocephalic and atraumatic.   Nose: No congestion/rhinnorhea.   Mouth/Throat: Mucous membranes are moist.   Neck: No stridor. Cardiovascular: Slow rate, regular rhythm. Normal and symmetric distal pulses are present in all extremities. No murmurs, rubs, or gallops. Respiratory: Normal respiratory effort without tachypnea nor retractions. Breath sounds are clear and equal bilaterally. No wheezes/rales/rhonchi. Gastrointestinal: Soft and nontender. No distention. No abdominal bruits.  Musculoskeletal: Nontender with normal range of motion in all extremities. No joint effusions.  No lower extremity tenderness nor edema. Neurologic:  Normal speech and language. No gross focal neurologic deficits are appreciated. Speech is normal. No gait instability. Skin:  Skin is warm, dry and intact. No rash noted. Psychiatric: Mood and affect are  normal. Speech and behavior are normal. Patient exhibits appropriate insight and judgment. ____________________________________________  EKG: Interpreted by me. Sinus bradycardia with first-degree AV block, rate is 57 bpm, normal QRS, long QT interval. ST and T-wave changes inferiorly and anterior laterally.  ____________________________________________  ED COURSE:  Pertinent labs & imaging results that were available  during my care of the patient were reviewed by me and considered in my medical decision making (see chart for details). Patient with syncope, unclear etiology. We'll check cardiac labs and reevaluate. ____________________________________________    LABS (pertinent positives/negatives)  Labs Reviewed  CBC - Abnormal; Notable for the following:    HCT 39.4 (*)    RDW 18.4 (*)    All other components within normal limits  TROPONIN I - Abnormal; Notable for the following:    Troponin I 0.07 (*)    All other components within normal limits  COMPREHENSIVE METABOLIC PANEL - Abnormal; Notable for the following:    Potassium 2.7 (*)    Chloride 93 (*)    CO2 33 (*)    Glucose, Bld 133 (*)    BUN 27 (*)    Creatinine, Ser 1.82 (*)    GFR calc non Af Amer 35 (*)    GFR calc Af Amer 41 (*)    All other components within normal limits  CBC  CREATININE, SERUM  MAGNESIUM  BASIC METABOLIC PANEL  CBC    RADIOLOGY  Chest x-ray IMPRESSION: Enlarged cardiac silhouette.  Small left pleural effusion. ____________________________________________  FINAL ASSESSMENT AND PLAN  Syncope, hypokalemia, elevated troponin  Plan: Patient with labs and imaging as dictated above. Patient with a significant syncopal event. He will need observation and monitoring. Unclear etiology at this time.    Emily Filbert, MD   Emily Filbert, MD 08/09/15 0981  Emily Filbert, MD 08/09/15 [phone]

## 2015-08-09 NOTE — ED Notes (Signed)
Attempted to call report - nurse unavailable   

## 2015-08-09 NOTE — H&P (Signed)
[facility] [place] [facility]   PATIENT NAME: Goerge Mohr    MR#:  [id number]  DATE OF BIRTH:  [birth year]   DATE OF ADMISSION:  08/09/2015  PRIMARY CARE PHYSICIAN: No primary care provider on file. follows with home hospice  REQUESTING/REFERRING PHYSICIAN: Williams  CHIEF COMPLAINT:   Chief Complaint  Patient presents with  . Loss of Consciousness    HISTORY OF PRESENT ILLNESS:  Eamonn Sermeno  is a 75 y.o. male with a known history of coronary artery disease status post PCI stent placement, congestive heart failure unspecified ejection fraction presenting after syncopal episode. Patient was at rest sitting in his chair wife went to check on him he was unresponsive, unresponsive total approximate 3 minutes. No loss of bowel or bladder function no witnessed seizure activity. He has no preceding symptoms including chest pain palpitations or shortness of breath. He is currently back to baseline  PAST MEDICAL HISTORY:   Past Medical History  Diagnosis Date  . CHF (congestive heart failure) (HCC)   . Renal disorder   . Coronary artery disease     PAST SURGICAL HISTORY:   Past Surgical History  Procedure Laterality Date  . Joint replacement    . Cardiac surgery      3 stents in "main artery"    SOCIAL HISTORY:   Social History  Substance Use Topics  . Smoking status: Former Games developer  . Smokeless tobacco: Not on file  . Alcohol Use: No    FAMILY HISTORY:   Family History  Problem Relation Age of Onset  . Hypertension Mother   . Pancreatic cancer Mother   . Hypertension Father   . Prostate cancer Father     DRUG ALLERGIES:   Allergies  Allergen Reactions  . Niaspan [Niacin Er] Other (See Comments)    Reaction:  Hot flashes     REVIEW OF SYSTEMS:  REVIEW OF SYSTEMS:  CONSTITUTIONAL: Denies fevers, chills, fatigue, weakness.  EYES: Denies blurred vision, double vision, or eye pain.  EARS, NOSE, THROAT: Denies tinnitus, ear  pain, hearing loss.  RESPIRATORY: denies cough, shortness of breath, wheezing  CARDIOVASCULAR: Denies chest pain, palpitations, positive edema.  GASTROINTESTINAL: Denies nausea, vomiting, diarrhea, abdominal pain.  GENITOURINARY: Denies dysuria, hematuria.  ENDOCRINE: Denies nocturia or thyroid problems. HEMATOLOGIC AND LYMPHATIC: Denies easy bruising or bleeding.  SKIN: Denies rash or lesions.  MUSCULOSKELETAL: Denies pain in neck, back, shoulder, knees, hips, or further arthritic symptoms.  NEUROLOGIC: Denies paralysis, paresthesias.  PSYCHIATRIC: Denies anxiety or depressive symptoms. Otherwise full review of systems performed by me is negative.   MEDICATIONS AT HOME:   Prior to Admission medications   Medication Sig Start Date End Date Taking? Authorizing Provider  acetaminophen (TYLENOL) 500 MG tablet Take 1,000 mg by mouth every 6 (six) hours as needed for mild pain or headache.    Yes Historical Provider, MD  allopurinol (ZYLOPRIM) 300 MG tablet Take 1 tablet by mouth 2 (two) times daily.   Yes Historical Provider, MD  amiodarone (PACERONE) 200 MG tablet Take 200 mg by mouth daily.   Yes Historical Provider, MD  aspirin 325 MG tablet Take 325 mg by mouth daily.   Yes Historical Provider, MD  carvedilol (COREG) 25 MG tablet Take 25 mg by mouth 2 (two) times daily with a meal.   Yes Historical Provider, MD  furosemide (LASIX) 40 MG tablet Take 80 mg by mouth daily.   Yes Historical Provider, MD  isosorbide mononitrate (IMDUR) 30 MG  24 hr tablet Take 30 mg by mouth daily.   Yes Historical Provider, MD  losartan (COZAAR) 50 MG tablet Take 50 mg by mouth 2 (two) times daily.   Yes Historical Provider, MD  metolazone (ZAROXOLYN) 2.5 MG tablet Take 2.5 mg by mouth every Monday, Wednesday, and Friday.   Yes Historical Provider, MD  potassium chloride SA (K-DUR,KLOR-CON) 20 MEQ tablet Take 20 mEq by mouth daily.    Yes Historical Provider, MD  timolol (TIMOPTIC) 0.5 % ophthalmic solution  Place 1 drop into both eyes 2 (two) times daily.    Yes Historical Provider, MD  traZODone (DESYREL) 50 MG tablet Take 50-100 mg by mouth at bedtime.    Yes Historical Provider, MD  cephALEXin (KEFLEX) 500 MG capsule Take 1 capsule (500 mg total) by mouth 2 (two) times daily. Patient not taking: Reported on 08/09/2015 02/21/15   Enedina Finner, MD      VITAL SIGNS:  Blood pressure 139/81, pulse 57, resp. rate 15, height  (1.803 m), weight 218 lb (98.884 kg), SpO2 95 %.  PHYSICAL EXAMINATION:  VITAL SIGNS: Filed Vitals:   08/09/15 1630 08/09/15 1700  BP: 119/73 139/81  Pulse: 57 57  Resp: 20 15   GENERAL:74 y.o.male currently in no acute distress.  HEAD: Normocephalic, atraumatic.  EYES: Pupils equal, round, reactive to light. Extraocular muscles intact. No scleral icterus.  MOUTH: Moist mucosal membrane. Dentition intact. No abscess noted.  EAR, NOSE, THROAT: Clear without exudates. No external lesions.  NECK: Supple. No thyromegaly. No nodules. No JVD.  PULMONARY: Clear to ascultation, without wheeze rails or rhonci. No use of accessory muscles, Good respiratory effort. good air entry bilaterally CHEST: Nontender to palpation.  CARDIOVASCULAR: S1 and S2. Bradycardic No murmurs, rubs, or gallops. 2+ edema. Pedal pulses 2+ bilaterally.  GASTROINTESTINAL: Soft, nontender, nondistended. No masses. Positive bowel sounds. No hepatosplenomegaly.  MUSCULOSKELETAL: No swelling, clubbing, or edema. Range of motion full in all extremities.  NEUROLOGIC: Cranial nerves II through XII are intact. No gross focal neurological deficits. Sensation intact. Reflexes intact.  SKIN: No ulceration, lesions, rashes, or cyanosis. Skin warm and dry. Turgor intact.  PSYCHIATRIC: Mood, affect within normal limits. The patient is awake, alert and oriented x 3. Insight, judgment intact.    LABORATORY PANEL:   CBC  Recent Labs Lab 08/09/15 1512  WBC 8.0  HGB 13.0  HCT 39.4*  PLT 177    ------------------------------------------------------------------------------------------------------------------  Chemistries   Recent Labs Lab 08/09/15 1512  NA 137  K 2.7*  CL 93*  CO2 33*  GLUCOSE 133*  BUN 27*  CREATININE 1.82*  CALCIUM 8.9  AST 24  ALT 19  ALKPHOS 83  BILITOT 1.2   ------------------------------------------------------------------------------------------------------------------  Cardiac Enzymes  Recent Labs Lab 08/09/15 1512  TROPONINI 0.07*   ------------------------------------------------------------------------------------------------------------------  RADIOLOGY:  Dg Chest 2 View  08/09/2015  CLINICAL DATA:  Syncope. EXAM: CHEST  2 VIEW COMPARISON:  02/19/2015 FINDINGS: The cardiac silhouette is enlarged. Mediastinal contours appear intact. The aorta is torturous and demonstrates atherosclerotic calcifications at the arch. There is no evidence of focal airspace consolidation, or pneumothorax. There is a small left pleural effusion. No evidence of pulmonary edema. Osseous structures are without acute abnormality. Soft tissues are grossly normal. IMPRESSION: Enlarged cardiac silhouette. Small left pleural effusion. Electronically Signed   By: Ted Mcalpine M.D.   On: 08/09/2015 15:15    EKG:   Orders placed or performed during the hospital encounter of 08/09/15  . EKG 12-Lead  . EKG 12-Lead  .  ED EKG  . ED EKG    IMPRESSION AND PLAN:   75 year old gentleman history of coronary artery disease status post PCI stent placement congestive heart failure unspecified ejection fraction presenting after syncopal episode  1. Syncope, unspecified: Admit to telemetry, trend cardiac enzymes 3, gentle IV fluid hydration, check orthostatic vital signs 2. Hypokalemia: Replace potassium to go 4-5, check magnesium 3. Coronary artery disease status post PCI stent placement no current angina continue aspirin and beta blocker 4. Essential  hypertension: Cozaar, Coreg 5. Venous thromboembolism prophylactic: Heparin subcutaneous    All the records are reviewed and case discussed with ED provider. Management plans discussed with the patient, family and they are in agreement.  CODE STATUS: DO NOT RESUSCITATE, follows with hospice  TOTAL TIME TAKING CARE OF THIS PATIENT: 35 minutes.    Hower,  Mardi Mainland.D on 08/09/2015 at 5:28 PM  Between 7am to 6pm - Pager - [phone]  After 6pm: House Pager: - [phone]  [facility]  Office  [phone]  CC: Primary care physician; No primary care provider on file.

## 2015-08-09 NOTE — Progress Notes (Signed)
Patient on the floor from ED. VS stable and no complaints of anything at this time. Stated he feels good. Tele box on and verified with Candace. NS . Oriented to the room and patient safety contract signed. In bed with bed in the lowest position and call light in reach with bed alarm on. Will continue to monitor.

## 2015-08-09 NOTE — ED Notes (Signed)
Critical lab value 2.7 K+, MD made aware.

## 2015-08-09 NOTE — ED Notes (Signed)
Pt arrived via EMS c/o syncopal episode. Per EMS pt was out for at least 3 minutes per wife he slumped over in the chair denies hitting head.

## 2015-08-10 ENCOUNTER — Observation Stay
Admit: 2015-08-10 | Discharge: 2015-08-10 | Disposition: A | Discharge status: Home / Self Care | Payer: Medicare Other | Attending: Cardiovascular Disease | Admitting: Cardiovascular Disease

## 2015-08-10 LAB — BASIC METABOLIC PANEL
Anion gap: 8 (ref 5–15)
BUN: 28 mg/dL — AB (ref 6–20)
CHLORIDE: 98 mmol/L — AB (ref 101–111)
CO2: 34 mmol/L — ABNORMAL HIGH (ref 22–32)
Calcium: 8.7 mg/dL — ABNORMAL LOW (ref 8.9–10.3)
Creatinine, Ser: 1.92 mg/dL — ABNORMAL HIGH (ref 0.61–1.24)
GFR calc non Af Amer: 33 mL/min — ABNORMAL LOW (ref >60)
GFR, EST AFRICAN AMERICAN: 39 mL/min — AB (ref >60)
Glucose, Bld: 127 mg/dL — ABNORMAL HIGH (ref 65–99)
POTASSIUM: 2.8 mmol/L — AB (ref 3.5–5.1)
SODIUM: 140 mmol/L (ref 135–145)

## 2015-08-10 LAB — CBC
HEMATOCRIT: 36.5 % — AB (ref 40.0–52.0)
HEMOGLOBIN: 12.2 g/dL — AB (ref 13.0–18.0)
MCH: 27.6 pg (ref 26.0–34.0)
MCHC: 33.4 g/dL (ref 32.0–36.0)
MCV: 82.7 fL (ref 80.0–100.0)
Platelets: 155 10*3/uL (ref 150–440)
RBC: 4.41 MIL/uL (ref 4.40–5.90)
RDW: 17.8 % — ABNORMAL HIGH (ref 11.5–14.5)
WBC: 6.8 10*3/uL (ref 3.8–10.6)

## 2015-08-10 LAB — TROPONIN I
Troponin I: 0.04 ng/mL — ABNORMAL HIGH (ref <0.031)
Troponin I: 0.04 ng/mL — ABNORMAL HIGH (ref <0.031)

## 2015-08-10 LAB — MAGNESIUM: Magnesium: 1.9 mg/dL (ref 1.7–2.4)

## 2015-08-10 LAB — POTASSIUM: Potassium: 3.2 mmol/L — ABNORMAL LOW (ref 3.5–5.1)

## 2015-08-10 MED ORDER — POTASSIUM CHLORIDE 10 MEQ/100ML IV SOLN
10.0000 meq | INTRAVENOUS | Status: AC
  Administered 2015-08-10 (×4): 10 meq via INTRAVENOUS
  Filled 2015-08-10 (×5): qty 100

## 2015-08-10 MED ORDER — CARVEDILOL 12.5 MG PO TABS
12.5000 mg | ORAL_TABLET | Freq: Two times a day (BID) | ORAL | Status: DC
  Administered 2015-08-10 – 2015-08-11 (×2): 12.5 mg via ORAL
  Filled 2015-08-10 (×2): qty 1

## 2015-08-10 MED ORDER — POTASSIUM CHLORIDE 10 MEQ/100ML IV SOLN
10.0000 meq | INTRAVENOUS | Status: AC
  Administered 2015-08-10 – 2015-08-11 (×4): 10 meq via INTRAVENOUS
  Filled 2015-08-10 (×4): qty 100

## 2015-08-10 MED ORDER — POTASSIUM CHLORIDE CRYS ER 20 MEQ PO TBCR
40.0000 meq | EXTENDED_RELEASE_TABLET | Freq: Every day | ORAL | Status: DC
  Administered 2015-08-10: 40 meq via ORAL
  Filled 2015-08-10: qty 2

## 2015-08-10 MED ORDER — HYDRALAZINE HCL 25 MG PO TABS
25.0000 mg | ORAL_TABLET | Freq: Two times a day (BID) | ORAL | Status: DC
  Administered 2015-08-10 – 2015-08-11 (×2): 25 mg via ORAL
  Filled 2015-08-10 (×2): qty 1

## 2015-08-10 NOTE — Progress Notes (Signed)
[facility]   PATIENT NAME: Larry Lawrence    MR#:  [id number]  DATE OF BIRTH:  [birth year]  SUBJECTIVE:  CHIEF COMPLAINT:   Chief Complaint  Patient presents with  . Loss of Consciousness   No complaint but weakness. 2.5 seconds pauses twice today. REVIEW OF SYSTEMS:  CONSTITUTIONAL: No fever, generalized weakness.  EYES: No blurred or double vision.  EARS, NOSE, AND THROAT: No tinnitus or ear pain.  RESPIRATORY: No cough, shortness of breath, wheezing or hemoptysis.  CARDIOVASCULAR: No chest pain, orthopnea, edema.  GASTROINTESTINAL: No nausea, vomiting, diarrhea or abdominal pain.  GENITOURINARY: No dysuria, hematuria.  ENDOCRINE: No polyuria, nocturia,  HEMATOLOGY: No anemia, easy bruising or bleeding SKIN: No rash or lesion. MUSCULOSKELETAL: No joint pain or arthritis.   NEUROLOGIC: No tingling, numbness, weakness.  PSYCHIATRY: No anxiety or depression.   DRUG ALLERGIES:   Allergies  Allergen Reactions  . Niaspan [Niacin Er] Other (See Comments)    Reaction:  Hot flashes     VITALS:  Blood pressure 156/71, pulse 59, temperature 98 F (36.7 C), temperature source Oral, resp. rate 16, height  (1.803 m), weight 98.884 kg (218 lb), SpO2 99 %.  PHYSICAL EXAMINATION:  GENERAL:  75 y.o.-year-old patient lying in the bed with no acute distress.  EYES: Pupils equal, round, reactive to light and accommodation. No scleral icterus. Extraocular muscles intact.  HEENT: Head atraumatic, normocephalic. Oropharynx and nasopharynx clear.  NECK:  Supple, no jugular venous distention. No thyroid enlargement, no tenderness.  LUNGS: Normal breath sounds bilaterally, no wheezing, rales,rhonchi or crepitation. No use of accessory muscles of respiration.  CARDIOVASCULAR: S1, S2 normal. No murmurs, rubs, or gallops.  ABDOMEN: Soft, nontender, nondistended. Bowel sounds present. No organomegaly or mass.  EXTREMITIES: No pedal edema,  cyanosis, or clubbing.  NEUROLOGIC: Cranial nerves II through XII are intact. Muscle strength 5/5 in all extremities. Sensation intact. Gait not checked.  PSYCHIATRIC: The patient is alert and oriented x 3.  SKIN: No obvious rash, lesion, or ulcer.    LABORATORY PANEL:   CBC  Recent Labs Lab 08/10/15 0505  WBC 6.8  HGB 12.2*  HCT 36.5*  PLT 155   ------------------------------------------------------------------------------------------------------------------  Chemistries   Recent Labs Lab 08/09/15 1512  08/10/15 0505  NA 137  --  140  K 2.7*  < > 2.8*  CL 93*  --  98*  CO2 33*  --  34*  GLUCOSE 133*  --  127*  BUN 27*  --  28*  CREATININE 1.82*  --  1.92*  CALCIUM 8.9  --  8.7*  MG 1.9  --  1.9  AST 24  --   --   ALT 19  --   --   ALKPHOS 83  --   --   BILITOT 1.2  --   --   < > = values in this interval not displayed. ------------------------------------------------------------------------------------------------------------------  Cardiac Enzymes  Recent Labs Lab 08/10/15 0505  TROPONINI 0.04*   ------------------------------------------------------------------------------------------------------------------  RADIOLOGY:  Dg Chest 2 View  08/09/2015  CLINICAL DATA:  Syncope. EXAM: CHEST  2 VIEW COMPARISON:  02/19/2015 FINDINGS: The cardiac silhouette is enlarged. Mediastinal contours appear intact. The aorta is torturous and demonstrates atherosclerotic calcifications at the arch. There is no evidence of focal airspace consolidation, or pneumothorax. There is a small left pleural effusion. No evidence of pulmonary edema. Osseous structures are without acute abnormality. Soft tissues are grossly normal. IMPRESSION: Enlarged cardiac silhouette. Small left  pleural effusion. Electronically Signed   By: Ted Mcalpine M.D.   On: 08/09/2015 15:15    EKG:   Orders placed or performed during the hospital encounter of 08/09/15  . EKG 12-Lead  . EKG 12-Lead  .  ED EKG  . ED EKG    ASSESSMENT AND PLAN:   75 year old gentleman history of coronary artery disease status post PCI stent placement congestive heart failure unspecified ejection fraction presenting after syncopal episode  1. Syncope, possible Lawrence to hypokalemia, prolonged QT interval or torsade de pointes  on telemetry, mild elevated cardiac enzymes. Discontinue amiodarone and decrease Coreg to 12.5 mg 2 times a day, add hydralazine 25 mg twice a day, follow-up echocardiogram per Dr. Welton Flakes  2. Hypokalemia: Replaced potassium, but still low at 2.8, given more KCl, magnesium is normal. Follow up BMP. Hold Lasix.  3. Coronary artery disease status post PCI stent placement no current angina. continue aspirin.  4. Essential hypertension: Cozaar, Coreg and Imdur.   * CKD stage 3. Stable.  All the records are reviewed and case discussed with Care Management/Social Workerr. Management plans discussed with the patient, family and they are in agreement.  CODE STATUS: DNR  TOTAL TIME TAKING CARE OF THIS PATIENT: 43 minutes.  Greater than 50% time was spent on coordination of care and face-to-face counseling.  POSSIBLE D/C IN 2 DAYS, DEPENDING ON CLINICAL CONDITION.   Shaune Pollack M.D on 08/10/2015 at 5:01 PM  Between 7am to 6pm - Pager - [phone]  After 6pm go to [email or web address] - password [email or web address] [facility]  [place] [facility]  Office  [phone]  CC: Primary care physician; No primary care provider on file.

## 2015-08-10 NOTE — Progress Notes (Signed)
RN made aware that pt's sodium level is 2.8. MD Sheryle Hail aware. New orders given.   Mayra Neer M

## 2015-08-10 NOTE — Consult Note (Signed)
MEDICATION RELATED CONSULT NOTE - INITIAL   Pharmacy Consult for electrolytes Indication: hypokalemia  Allergies  Allergen Reactions  . Niaspan [Niacin Er] Other (See Comments)    Reaction:  Hot flashes     Patient Measurements: Height:  (180.3 cm) Weight: 218 lb (98.884 kg) IBW/kg (Calculated) : 75.3 Adjusted Body Weight:   Vital Signs: Temp: 98 F (36.7 C) (02/09 1109) Temp Source: Oral (02/09 1109) BP: 156/71 mmHg (02/09 1109) Pulse Rate: 59 (02/09 1109) Intake/Output from previous day: 02/08 0701 - 02/09 0700 In: -  Out: 400 [Urine:400] Intake/Output from this shift: Total I/O In: -  Out: 300 [Urine:300]  Labs:  Recent Labs  08/09/15 1512 08/10/15 0505  WBC 8.0 6.8  HGB 13.0 12.2*  HCT 39.4* 36.5*  PLT 177 155  CREATININE 1.82* 1.92*  MG 1.9 1.9  ALBUMIN 4.1  --   PROT 7.6  --   AST 24  --   ALT 19  --   ALKPHOS 83  --   BILITOT 1.2  --    Estimated Creatinine Clearance: 41.7 mL/min (by C-G formula based on Cr of 1.92).   Microbiology: No results found for this or any previous visit (from the past 720 hour(s)).  Medical History: Past Medical History  Diagnosis Date  . CHF (congestive heart failure) (HCC)   . Renal disorder   . Coronary artery disease     Medications:  Scheduled:  . allopurinol  300 mg Oral BID  . amiodarone  200 mg Oral Daily  . aspirin  325 mg Oral Daily  . carvedilol  25 mg Oral BID WC  . heparin  5,000 Units Subcutaneous 3 times per day  . isosorbide mononitrate  30 mg Oral Daily  . losartan  50 mg Oral BID  . metolazone  2.5 mg Oral Q M,W,F  . potassium chloride SA  40 mEq Oral Daily  . sodium chloride flush  3 mL Intravenous Q12H  . timolol  1 drop Both Eyes BID  . traZODone  50-100 mg Oral QHS    Assessment: Pt is a 75 year old male with hypokalemia. Pharmacy consulted to replace. Pt has received 30 MEQ IV KCL and 40 MEQ po today. K at 0500 was 2.8. Mg WNL at 1.9  Goal of Therapy:  K=3.5-5  Plan:   Will recheck K level at 1800. Supplement as needed. Thank you for the consult.  Shatia Sindoni D Tamula Morrical 08/10/2015,3:32 PM

## 2015-08-10 NOTE — Progress Notes (Signed)
Larry Lawrence is a 75 y.o. male  [id number]  Primary Cardiologist: Adrian Blackwater Reason for Consultation: Syncope  HPI: This is a 75 year old African-American male with a past medical history of cardiomyopathy and atrial fibrillation and CHF presented to the hospital with an episode where he passed out for like 3 minutes. No chest pain no dizziness now   Review of Systems: No chest pain no orthopnea PND or leg swelling   Past Medical History  Diagnosis Date  . CHF (congestive heart failure) (HCC)   . Renal disorder   . Coronary artery disease     Medications Prior to Admission  Medication Sig Dispense Refill  . acetaminophen (TYLENOL) 500 MG tablet Take 1,000 mg by mouth every 6 (six) hours as needed for mild pain or headache.     . allopurinol (ZYLOPRIM) 300 MG tablet Take 1 tablet by mouth 2 (two) times daily.    Marland Kitchen amiodarone (PACERONE) 200 MG tablet Take 200 mg by mouth daily.    Marland Kitchen aspirin 325 MG tablet Take 325 mg by mouth daily.    . carvedilol (COREG) 25 MG tablet Take 25 mg by mouth 2 (two) times daily with a meal.    . furosemide (LASIX) 40 MG tablet Take 80 mg by mouth daily.    . isosorbide mononitrate (IMDUR) 30 MG 24 hr tablet Take 30 mg by mouth daily.    Marland Kitchen losartan (COZAAR) 50 MG tablet Take 50 mg by mouth 2 (two) times daily.    . metolazone (ZAROXOLYN) 2.5 MG tablet Take 2.5 mg by mouth every Monday, Wednesday, and Friday.    . potassium chloride SA (K-DUR,KLOR-CON) 20 MEQ tablet Take 20 mEq by mouth daily.     . timolol (TIMOPTIC) 0.5 % ophthalmic solution Place 1 drop into both eyes 2 (two) times daily.     . traZODone (DESYREL) 50 MG tablet Take 50-100 mg by mouth at bedtime.     . cephALEXin (KEFLEX) 500 MG capsule Take 1 capsule (500 mg total) by mouth 2 (two) times daily. (Patient not taking: Reported on 08/09/2015) 10 capsule 0     . allopurinol  300 mg Oral BID  . aspirin  325 mg Oral Daily  . carvedilol  12.5 mg Oral BID WC  . heparin  5,000 Units  Subcutaneous 3 times per day  . hydrALAZINE  25 mg Oral BID  . isosorbide mononitrate  30 mg Oral Daily  . losartan  50 mg Oral BID  . metolazone  2.5 mg Oral Q M,W,F  . potassium chloride SA  40 mEq Oral Daily  . sodium chloride flush  3 mL Intravenous Q12H  . timolol  1 drop Both Eyes BID  . traZODone  50-100 mg Oral QHS    Infusions: . sodium chloride 75 mL/hr at 08/10/15 0657    Allergies  Allergen Reactions  . Niaspan [Niacin Er] Other (See Comments)    Reaction:  Hot flashes     Social History   Social History  . Marital Status: Married    Spouse Name: N/A  . Number of Children: N/A  . Years of Education: N/A   Occupational History  . Not on file.   Social History Main Topics  . Smoking status: Former Games developer  . Smokeless tobacco: Not on file  . Alcohol Use: No  . Drug Use: Not on file  . Sexual Activity: Not on file   Other Topics Concern  . Not on file  Social History Narrative    Family History  Problem Relation Age of Onset  . Hypertension Mother   . Pancreatic cancer Mother   . Hypertension Father   . Prostate cancer Father     PHYSICAL EXAM: Filed Vitals:   08/10/15 0419 08/10/15 1109  BP: 172/84 156/71  Pulse: 63 59  Temp:  98 F (36.7 C)  Resp:  16     Intake/Output Summary (Last 24 hours) at 08/10/15 1633 Last data filed at 08/10/15 1500  Gross per 24 hour  Intake 1432.5 ml  Output    700 ml  Net  732.5 ml    General:  Well appearing. No respiratory difficulty HEENT: normal Neck: supple. no JVD. Carotids 2+ bilat; no bruits. No lymphadenopathy or thryomegaly appreciated. Cor: PMI nondisplaced. Regular rate & rhythm. No rubs, gallops or murmurs. Lungs: clear Abdomen: soft, nontender, nondistended. No hepatosplenomegaly. No bruits or masses. Good bowel sounds. Extremities: no cyanosis, clubbing, rash, edema Neuro: alert & oriented x 3, cranial nerves grossly intact. moves all 4 extremities w/o difficulty. Affect  pleasant.  ECG: Sinus bradycardia 58 bpm with prolonged QT interval and first degree AV block  Results for orders placed or performed during the hospital encounter of 08/09/15 (from the past 24 hour(s))  Potassium     Status: Abnormal   Collection Time: 08/09/15  7:16 PM  Result Value Ref Range   Potassium 3.0 (L) 3.5 - 5.1 mmol/L  Troponin I     Status: Abnormal   Collection Time: 08/10/15 12:05 AM  Result Value Ref Range   Troponin I 0.04 (H) <0.031 ng/mL  Basic metabolic panel     Status: Abnormal   Collection Time: 08/10/15  5:05 AM  Result Value Ref Range   Sodium 140 135 - 145 mmol/L   Potassium 2.8 (LL) 3.5 - 5.1 mmol/L   Chloride 98 (L) 101 - 111 mmol/L   CO2 34 (H) 22 - 32 mmol/L   Glucose, Bld 127 (H) 65 - 99 mg/dL   BUN 28 (H) 6 - 20 mg/dL   Creatinine, Ser 1.30 (H) 0.61 - 1.24 mg/dL   Calcium 8.7 (L) 8.9 - 10.3 mg/dL   GFR calc non Af Amer 33 (L) >60 mL/min   GFR calc Af Amer 39 (L) >60 mL/min   Anion gap 8 5 - 15  CBC     Status: Abnormal   Collection Time: 08/10/15  5:05 AM  Result Value Ref Range   WBC 6.8 3.8 - 10.6 K/uL   RBC 4.41 4.40 - 5.90 MIL/uL   Hemoglobin 12.2 (L) 13.0 - 18.0 g/dL   HCT 86.5 (L) 78.4 - 69.6 %   MCV 82.7 80.0 - 100.0 fL   MCH 27.6 26.0 - 34.0 pg   MCHC 33.4 32.0 - 36.0 g/dL   RDW 29.5 (H) 28.4 - 13.2 %   Platelets 155 150 - 440 K/uL  Troponin I     Status: Abnormal   Collection Time: 08/10/15  5:05 AM  Result Value Ref Range   Troponin I 0.04 (H) <0.031 ng/mL  Magnesium     Status: None   Collection Time: 08/10/15  5:05 AM  Result Value Ref Range   Magnesium 1.9 1.7 - 2.4 mg/dL   Dg Chest 2 View  10/02/100  CLINICAL DATA:  Syncope. EXAM: CHEST  2 VIEW COMPARISON:  02/19/2015 FINDINGS: The cardiac silhouette is enlarged. Mediastinal contours appear intact. The aorta is torturous and demonstrates atherosclerotic calcifications at the arch. There  is no evidence of focal airspace consolidation, or pneumothorax. There is a small left  pleural effusion. No evidence of pulmonary edema. Osseous structures are without acute abnormality. Soft tissues are grossly normal. IMPRESSION: Enlarged cardiac silhouette. Small left pleural effusion. Electronically Signed   By: Ted Mcalpine M.D.   On: 08/09/2015 15:15     ASSESSMENT AND PLAN: Syncopal episodes with the potassium 2.8 and prolonged QT interval may have had torsade de pointes as the cause of syncope. Advise replacing potassium to 4 and magnesium 2 normal levels. Also will decrease carvedilol to 12.5 twice a day and stop amiodarone. Amiodarone is being stopped because of prolonged QT interval. Also we will add hydralazine 25 twice a day as patient has blood pressure elevated. Will check ejection fraction by echocardiogram which has been scheduled.  Antonious Omahoney A

## 2015-08-10 NOTE — Consult Note (Signed)
MEDICATION RELATED CONSULT NOTE -FOLLOW UP  Pharmacy Consult for electrolytes Indication: hypokalemia  Allergies  Allergen Reactions  . Niaspan [Niacin Er] Other (See Comments)    Reaction:  Hot flashes     Patient Measurements: Height:  (180.3 cm) Weight: 218 lb (98.884 kg) IBW/kg (Calculated) : 75.3 Adjusted Body Weight:   Vital Signs: Temp: 98 F (36.7 C) (02/09 1109) Temp Source: Oral (02/09 1109) BP: 156/71 mmHg (02/09 1109) Pulse Rate: 59 (02/09 1109) Intake/Output from previous day: 02/08 0701 - 02/09 0700 In: 1053.8 [I.V.:1053.8] Out: 400 [Urine:400] Intake/Output from this shift:    Labs:  Recent Labs  08/09/15 1512 08/10/15 0505  WBC 8.0 6.8  HGB 13.0 12.2*  HCT 39.4* 36.5*  PLT 177 155  CREATININE 1.82* 1.92*  MG 1.9 1.9  ALBUMIN 4.1  --   PROT 7.6  --   AST 24  --   ALT 19  --   ALKPHOS 83  --   BILITOT 1.2  --    Estimated Creatinine Clearance: 41.7 mL/min (by C-G formula based on Cr of 1.92).   Microbiology: No results found for this or any previous visit (from the past 720 hour(s)).  Medical History: Past Medical History  Diagnosis Date  . CHF (congestive heart failure) (HCC)   . Renal disorder   . Coronary artery disease     Medications:  Scheduled:  . allopurinol  300 mg Oral BID  . aspirin  325 mg Oral Daily  . carvedilol  12.5 mg Oral BID WC  . heparin  5,000 Units Subcutaneous 3 times per day  . hydrALAZINE  25 mg Oral BID  . isosorbide mononitrate  30 mg Oral Daily  . losartan  50 mg Oral BID  . metolazone  2.5 mg Oral Q M,W,F  . potassium chloride  10 mEq Intravenous Q1 Hr x 4  . potassium chloride SA  40 mEq Oral Daily  . sodium chloride flush  3 mL Intravenous Q12H  . timolol  1 drop Both Eyes BID  . traZODone  50-100 mg Oral QHS    Assessment: Pt is a 75 year old male with hypokalemia. Pharmacy consulted to replace. Pt has received 30 MEQ IV KCL and 40 MEQ po today. K at 0500 was 2.8. Mg WNL at 1.9  ADD:  K resulted @ 3.2  Goal of Therapy:  K=3.5-5  Plan:  Will give a KCl 10 mEq IV x 4. Will recheck K with am labs.   Sherlyne Crownover D 08/10/2015,8:00 PM

## 2015-08-10 NOTE — Progress Notes (Signed)
*  PRELIMINARY RESULTS* Echocardiogram 2D Echocardiogram has been performed.  Larry Lawrence 08/10/2015, 6:32 PM

## 2015-08-10 NOTE — Care Management Obs Status (Signed)
MEDICARE OBSERVATION STATUS NOTIFICATION   Patient Details  Name: Larry Lawrence MRN: [id number] Date of Birth: 11/27/1940   Medicare Observation Status Notification Given:  Yes    Berna Bue, RN 08/10/2015, 9:27 AM

## 2015-08-11 LAB — BASIC METABOLIC PANEL
Anion gap: 10 (ref 5–15)
BUN: 21 mg/dL — ABNORMAL HIGH (ref 6–20)
CALCIUM: 8.7 mg/dL — AB (ref 8.9–10.3)
CHLORIDE: 103 mmol/L (ref 101–111)
CO2: 27 mmol/L (ref 22–32)
CREATININE: 1.5 mg/dL — AB (ref 0.61–1.24)
GFR calc non Af Amer: 45 mL/min — ABNORMAL LOW (ref >60)
GFR, EST AFRICAN AMERICAN: 52 mL/min — AB (ref >60)
Glucose, Bld: 121 mg/dL — ABNORMAL HIGH (ref 65–99)
Potassium: 3.2 mmol/L — ABNORMAL LOW (ref 3.5–5.1)
SODIUM: 140 mmol/L (ref 135–145)

## 2015-08-11 LAB — MAGNESIUM: MAGNESIUM: 1.9 mg/dL (ref 1.7–2.4)

## 2015-08-11 MED ORDER — CARVEDILOL 12.5 MG PO TABS: 12.5000 mg | ORAL_TABLET | Freq: Two times a day (BID) | ORAL | Status: DC

## 2015-08-11 MED ORDER — HYDRALAZINE HCL 25 MG PO TABS: 25.0000 mg | ORAL_TABLET | Freq: Three times a day (TID) | ORAL | Status: DC

## 2015-08-11 MED ORDER — HYDRALAZINE HCL 20 MG/ML IJ SOLN
10.0000 mg | Freq: Four times a day (QID) | INTRAMUSCULAR | Status: DC | PRN
Start: 2015-08-11 — End: 2015-08-11

## 2015-08-11 MED ORDER — POTASSIUM CHLORIDE CRYS ER 20 MEQ PO TBCR
40.0000 meq | EXTENDED_RELEASE_TABLET | Freq: Once | ORAL | Status: AC
  Administered 2015-08-11: 40 meq via ORAL
  Filled 2015-08-11: qty 2

## 2015-08-11 MED ORDER — POTASSIUM CHLORIDE CRYS ER 20 MEQ PO TBCR: 20.0000 meq | EXTENDED_RELEASE_TABLET | Freq: Two times a day (BID) | ORAL | Status: DC

## 2015-08-11 MED ORDER — POTASSIUM CHLORIDE CRYS ER 20 MEQ PO TBCR
20.0000 meq | EXTENDED_RELEASE_TABLET | Freq: Every day | ORAL | Status: DC
Start: 2015-08-11 — End: 2015-08-11
  Filled 2015-08-11: qty 1

## 2015-08-11 NOTE — Discharge Instructions (Signed)
Heart healthy diet. Activity as tolerated. Resume hospice are at home.

## 2015-08-11 NOTE — Care Management (Signed)
Patient placed in observation for  a syncopal episode.  he is currently followed by [facility] since 08/2014.  Wife and patient verbalize that patient "was discharge from hospice because I was not suppose to come to the hospital.  They made me sign some paper>  They will not let him see his cardiologist."  Discussed that patient was not discharged from the agency- that while in the hospital the hospice benefit revoked but patient will be reinstated.  Faxed information to [facility].  Updated patient and wife and discussed if not happy with hospice care, may need to discuss transfer to another agency.  Requested resumption of care orders from attending

## 2015-08-11 NOTE — Progress Notes (Signed)
SUBJECTIVE: Feeling much better   Filed Vitals:   08/10/15 1109 08/10/15 2006 08/11/15 0458 08/11/15 0735  BP: 156/71 170/79 168/76 184/87  Pulse: 59 62 57 56  Temp: 98 F (36.7 C) 98.1 F (36.7 C) 98.4 F (36.9 C) 97.9 F (36.6 C)  TempSrc: Oral Oral Oral Oral  Resp: Height:      Weight:      SpO2: 99% 97% 98% 98%    Intake/Output Summary (Last 24 hours) at 08/11/15 0900 Last data filed at 08/11/15 0801  Gross per 24 hour  Intake   2280 ml  Output   2275 ml  Net      5 ml    LABS: Basic Metabolic Panel:  Recent Labs  16/10/96 0505 08/10/15 1833 08/11/15 0430  NA 140  --  140  K 2.8* 3.2* 3.2*  CL 98*  --  103  CO2 34*  --  27  GLUCOSE 127*  --  121*  BUN 28*  --  21*  CREATININE 1.92*  --  1.50*  CALCIUM 8.7*  --  8.7*  MG 1.9  --  1.9   Liver Function Tests:  Recent Labs  08/09/15 1512  AST 24  ALT 19  ALKPHOS 83  BILITOT 1.2  PROT 7.6  ALBUMIN 4.1   No results for input(s): LIPASE, AMYLASE in the last 72 hours. CBC:  Recent Labs  08/09/15 1512 08/10/15 0505  WBC 8.0 6.8  HGB 13.0 12.2*  HCT 39.4* 36.5*  MCV 82.1 82.7  PLT 177 155   Cardiac Enzymes:  Recent Labs  08/09/15 1512 08/10/15 0005 08/10/15 0505  TROPONINI 0.07* 0.04* 0.04*   BNP: Invalid input(s): POCBNP D-Dimer: No results for input(s): DDIMER in the last 72 hours. Hemoglobin A1C: No results for input(s): HGBA1C in the last 72 hours. Fasting Lipid Panel: No results for input(s): CHOL, HDL, LDLCALC, TRIG, CHOLHDL, LDLDIRECT in the last 72 hours. Thyroid Function Tests: No results for input(s): TSH, T4TOTAL, T3FREE, THYROIDAB in the last 72 hours.  Invalid input(s): FREET3 Anemia Panel: No results for input(s): VITAMINB12, FOLATE, FERRITIN, TIBC, IRON, RETICCTPCT in the last 72 hours.   PHYSICAL EXAM General: Well developed, well nourished, in no acute distress HEENT:  Normocephalic and atramatic Neck:  No JVD.  Lungs: Clear bilaterally to  auscultation and percussion. Heart: HRRR . Normal S1 and S2 without gallops or murmurs.  Abdomen: Bowel sounds are positive, abdomen soft and non-tender  Msk:  Back normal, normal gait. Normal strength and tone for age. Extremities: No clubbing, cyanosis or edema.   Neuro: Alert and oriented X 3. Psych:  Good affect, responds appropriately  TELEMETRY: Sinus rhythm 64 bpm  ASSESSMENT AND PLAN: Syncopal episode with hypokalemia and prolonged QT interval on EKG. After discontinuation of amiodarone and decreasing carvedilol to 12.5 twice a day heart rate is much better. May go home with follow-up in the office next Thursday at 10:00.  Active Problems:   Syncope   Hypokalemia    Aulden Calise A, MD, [facility] 08/11/2015 9:00 AM

## 2015-08-11 NOTE — Progress Notes (Signed)
Pt discharged to home via wc.  Instructions and rx given to pt.  Questions answered.  No distress.  

## 2015-08-11 NOTE — Consult Note (Signed)
MEDICATION RELATED CONSULT NOTE -FOLLOW UP  Pharmacy Consult for electrolytes Indication: hypokalemia  Allergies  Allergen Reactions  . Niaspan [Niacin Er] Other (See Comments)    Reaction:  Hot flashes     Patient Measurements: Height:  (180.3 cm) Weight: 218 lb (98.884 kg) IBW/kg (Calculated) : 75.3 Adjusted Body Weight:   Vital Signs: Temp: 97.9 F (36.6 C) (02/10 0735) Temp Source: Oral (02/10 0735) BP: 184/87 mmHg (02/10 0735) Pulse Rate: 56 (02/10 0735) Intake/Output from previous day: 02/09 0701 - 02/10 0700 In: 2203.8 [I.V.:1803.8; IV Piggyback:400] Out: 1925 [Urine:1925] Intake/Output from this shift: Total I/O In: 76.3 [I.V.:76.3] Out: -   Labs:  Recent Labs  08/09/15 1512 08/10/15 0505 08/11/15 0430  WBC 8.0 6.8  --   HGB 13.0 12.2*  --   HCT 39.4* 36.5*  --   PLT 177 155  --   CREATININE 1.82* 1.92* 1.50*  MG 1.9 1.9 1.9  ALBUMIN 4.1  --   --   PROT 7.6  --   --   AST 24  --   --   ALT 19  --   --   ALKPHOS 83  --   --   BILITOT 1.2  --   --    Estimated Creatinine Clearance: 53.3 mL/min (by C-G formula based on Cr of 1.5).   Microbiology: No results found for this or any previous visit (from the past 720 hour(s)).  Medical History: Past Medical History  Diagnosis Date  . CHF (congestive heart failure) (HCC)   . Renal disorder   . Coronary artery disease     Medications:  Scheduled:  . allopurinol  300 mg Oral BID  . aspirin  325 mg Oral Daily  . carvedilol  12.5 mg Oral BID WC  . heparin  5,000 Units Subcutaneous 3 times per day  . hydrALAZINE  25 mg Oral 3 times per day  . isosorbide mononitrate  30 mg Oral Daily  . losartan  50 mg Oral BID  . metolazone  2.5 mg Oral Q M,W,F  . potassium chloride  20 mEq Oral Daily  . sodium chloride flush  3 mL Intravenous Q12H  . timolol  1 drop Both Eyes BID  . traZODone  50-100 mg Oral QHS    Assessment: Pt is a 75 year old male with hypokalemia. Pharmacy consulted to replace.   Pt received 80 MEQ KCL IV and 40 MEQ PO yesterday. This AM K is 3.2. Pt has already received 40 MEQ Po this AM and has 20 MEQ daily ordered to start this evening by IM.     Goal of Therapy:  K=3.5-5  Plan:  Will recheck K at 1800. Replace as needed.   Olene Floss, Pharm.D Clinical Pharmacist 08/11/2015,8:14 AM

## 2015-08-11 NOTE — Discharge Summary (Signed)
[facility] - [place] at [facility]   PATIENT NAME: Larry Lawrence    MR#:  [id number]  DATE OF BIRTH:  02/10/1941  DATE OF ADMISSION:  08/09/2015 ADMITTING PHYSICIAN: Wyatt Haste, MD  DATE OF DISCHARGE: 08/11/2015  1:54 PM  PRIMARY CARE PHYSICIAN: No primary care provider on file.    ADMISSION DIAGNOSIS:  Syncope, unspecified syncope type [R55]   DISCHARGE DIAGNOSIS:  Syncope Hypokalemia SECONDARY DIAGNOSIS:   Past Medical History  Diagnosis Date  . CHF (congestive heart failure) (HCC)   . Renal disorder   . Coronary artery disease     HOSPITAL COURSE:   1. Syncope, possible due to hypokalemia, prolonged QT interval or torsade de pointes  on telemetry, mild elevated cardiac enzymes. Discontinued amiodarone and decreased Coreg to 12.5 mg 2 times a day, added hydralazine 25 mg, follow-up echocardiogram per Dr. Welton Flakes.  2. Hypokalemia: Replaced potassium and improving. magnesium is normal. Increase KCl to 20 mEq bid but he needs Follow up BMP as outpatient ASAP.  Lasix was hold. Resume after discharge.  3. Coronary artery disease status post PCI stent placement no current angina. continue aspirin.  4. Essential hypertension: Cozaar, Coreg and Imdur and added hydralazine.  * CKD stage 3. Stable.  The patient can be discharged today and follow-up him as outpatient this coming week per Dr. Welton Flakes. DISCHARGE CONDITIONS:  Discharged to home and resume hospice care today   CONSULTS OBTAINED:  Treatment Team:  Wyatt Haste, MD Laurier Nancy, MD  DRUG ALLERGIES:   Allergies  Allergen Reactions  . Niaspan [Niacin Er] Other (See Comments)    Reaction:  Hot flashes     DISCHARGE MEDICATIONS:   Discharge Medication List as of 08/11/2015 12:14 PM    START taking these medications   Details  hydrALAZINE (APRESOLINE) 25 MG tablet Take 1 tablet (25 mg total) by mouth 3 (three) times daily., Starting 08/11/2015, Until Discontinued, Print       CONTINUE these medications which have CHANGED   Details  !! carvedilol (COREG) 12.5 MG tablet Take 1 tablet (12.5 mg total) by mouth 2 (two) times daily with a meal., Starting 08/11/2015, Until Discontinued, Print    potassium chloride SA (K-DUR,KLOR-CON) 20 MEQ tablet Take 1 tablet (20 mEq total) by mouth 2 (two) times daily., Starting 08/11/2015, Until Discontinued, Print     !! - Potential duplicate medications found. Please discuss with provider.    CONTINUE these medications which have NOT CHANGED   Details  acetaminophen (TYLENOL) 500 MG tablet Take 1,000 mg by mouth every 6 (six) hours as needed for mild pain or headache. , Until Discontinued, Historical Med    allopurinol (ZYLOPRIM) 300 MG tablet Take 1 tablet by mouth 2 (two) times daily., Until Discontinued, Historical Med    aspirin 325 MG tablet Take 325 mg by mouth daily., Until Discontinued, Historical Med    !! carvedilol (COREG) 25 MG tablet Take 25 mg by mouth 2 (two) times daily with a meal., Until Discontinued, Historical Med    furosemide (LASIX) 40 MG tablet Take 80 mg by mouth daily., Until Discontinued, Historical Med    isosorbide mononitrate (IMDUR) 30 MG 24 hr tablet Take 30 mg by mouth daily., Until Discontinued, Historical Med    losartan (COZAAR) 50 MG tablet Take 50 mg by mouth 2 (two) times daily., Until Discontinued, Historical Med    metolazone (ZAROXOLYN) 2.5 MG tablet Take 2.5 mg by mouth every Monday, Wednesday, and Friday., Until Discontinued,  Historical Med    timolol (TIMOPTIC) 0.5 % ophthalmic solution Place 1 drop into both eyes 2 (two) times daily. , Until Discontinued, Historical Med    traZODone (DESYREL) 50 MG tablet Take 50-100 mg by mouth at bedtime. , Until Discontinued, Historical Med    cephALEXin (KEFLEX) 500 MG capsule Take 1 capsule (500 mg total) by mouth 2 (two) times daily., Starting 02/21/2015, Until Discontinued, Normal     !! - Potential duplicate medications found. Please  discuss with provider.    STOP taking these medications     amiodarone (PACERONE) 200 MG tablet          DISCHARGE INSTRUCTIONS:      If you experience worsening of your admission symptoms, develop shortness of breath, life threatening emergency, suicidal or homicidal thoughts you must seek medical attention immediately by calling 911 or calling your MD immediately  if symptoms less severe.  You Must read complete instructions/literature along with all the possible adverse reactions/side effects for all the Medicines you take and that have been prescribed to you. Take any new Medicines after you have completely understood and accept all the possible adverse reactions/side effects.   Please note  You were cared for by a hospitalist during your hospital stay. If you have any questions about your discharge medications or the care you received while you were in the hospital after you are discharged, you can call the unit and asked to speak with the hospitalist on call if the hospitalist that took care of you is not available. Once you are discharged, your primary care physician will handle any further medical issues. Please note that NO REFILLS for any discharge medications will be authorized once you are discharged, as it is imperative that you return to your primary care physician (or establish a relationship with a primary care physician if you do not have one) for your aftercare needs so that they can reassess your need for medications and monitor your lab values.    Today   SUBJECTIVE   No complaint   VITAL SIGNS:  Blood pressure 139/64, pulse 59, temperature 98.2 F (36.8 C), temperature source Oral, resp. rate 16, height  (1.803 m), weight 98.884 kg (218 lb), SpO2 98 %.  I/O:   Intake/Output Summary (Last 24 hours) at 08/11/15 1744 Last data filed at 08/11/15 1130  Gross per 24 hour  Intake 2864.25 ml  Output   1925 ml  Net 939.25 ml    PHYSICAL EXAMINATION:   GENERAL:  75 y.o.-year-old patient lying in the bed with no acute distress.  EYES: Pupils equal, round, reactive to light and accommodation. No scleral icterus. Extraocular muscles intact.  HEENT: Head atraumatic, normocephalic. Oropharynx and nasopharynx clear.  NECK:  Supple, no jugular venous distention. No thyroid enlargement, no tenderness.  LUNGS: Normal breath sounds bilaterally, no wheezing, rales,rhonchi or crepitation. No use of accessory muscles of respiration.  CARDIOVASCULAR: S1, S2 normal. No murmurs, rubs, or gallops.  ABDOMEN: Soft, non-tender, non-distended. Bowel sounds present. No organomegaly or mass.  EXTREMITIES: No pedal edema, cyanosis, or clubbing.  NEUROLOGIC: Cranial nerves II through XII are intact. Muscle strength 5/5 in all extremities. Sensation intact. Gait not checked.  PSYCHIATRIC: The patient is alert and oriented x 3.  SKIN: No obvious rash, lesion, or ulcer.   DATA REVIEW:   CBC  Recent Labs Lab 08/10/15 0505  WBC 6.8  HGB 12.2*  HCT 36.5*  PLT 155    Chemistries   Recent Labs Lab  08/09/15 1512  08/11/15 0430  NA 137  < > 140  K 2.7*  < > 3.2*  CL 93*  < > 103  CO2 33*  < > 27  GLUCOSE 133*  < > 121*  BUN 27*  < > 21*  CREATININE 1.82*  < > 1.50*  CALCIUM 8.9  < > 8.7*  MG 1.9  < > 1.9  AST 24  --   --   ALT 19  --   --   ALKPHOS 83  --   --   BILITOT 1.2  --   --   < > = values in this interval not displayed.  Cardiac Enzymes  Recent Labs Lab 08/10/15 0505  TROPONINI 0.04*    Microbiology Results  No results found for this or any previous visit.  RADIOLOGY:  No results found.      Management plans discussed with the patient, family and they are in agreement.  CODE STATUS:  Code Status History    Date Active Date Inactive Code Status Order ID Comments User Context   08/09/2015  4:44 PM 08/11/2015  4:54 PM DNR [id number]  Wyatt Haste, MD ED   08/09/2015  4:43 PM 08/09/2015  4:44 PM Full Code [id number]  Wyatt Haste, MD ED   02/20/2015  9:37 AM 02/21/2015  7:16 PM DNR [id number]  Crissie Figures, MD Inpatient    Questions for Most Recent Historical Code Status (Order [id number])    Question Answer Comment   In the event of cardiac or respiratory ARREST Do not call a "code blue"    In the event of cardiac or respiratory ARREST Do not perform Intubation, CPR, defibrillation or ACLS    In the event of cardiac or respiratory ARREST Use medication by any route, position, wound care, and other measures to relive pain and suffering. May use oxygen, suction and manual treatment of airway obstruction as needed for comfort.       TOTAL TIME TAKING CARE OF THIS PATIENT: 37 minutes.    Shaune Pollack M.D on 08/11/2015 at 5:44 PM  Between 7am to 6pm - Pager - [phone]  After 6pm go to [email or web address] - password [email or web address] [facility]  [place] [facility]  Office  [phone]  CC: Primary care physician; No primary care provider on file.

## 2015-12-15 ENCOUNTER — Other Ambulatory Visit: Payer: Self-pay

## 2015-12-29 ENCOUNTER — Encounter: Payer: Self-pay | Admitting: Family Medicine

## 2015-12-29 ENCOUNTER — Ambulatory Visit (INDEPENDENT_AMBULATORY_CARE_PROVIDER_SITE_OTHER): Payer: Medicare Other | Admitting: Family Medicine

## 2015-12-29 VITALS — BP 138/100 | HR 68 | Ht 71.0 in | Wt 231.0 lb

## 2015-12-29 DIAGNOSIS — I2510 Atherosclerotic heart disease of native coronary artery without angina pectoris: Secondary | ICD-10-CM

## 2015-12-29 DIAGNOSIS — R231 Pallor: Secondary | ICD-10-CM

## 2015-12-29 DIAGNOSIS — I509 Heart failure, unspecified: Secondary | ICD-10-CM | POA: Diagnosis not present

## 2015-12-29 DIAGNOSIS — I2584 Coronary atherosclerosis due to calcified coronary lesion: Secondary | ICD-10-CM

## 2015-12-29 DIAGNOSIS — N183 Chronic kidney disease, stage 3 unspecified: Secondary | ICD-10-CM

## 2015-12-29 DIAGNOSIS — Z7689 Persons encountering health services in other specified circumstances: Secondary | ICD-10-CM

## 2015-12-29 DIAGNOSIS — Z7189 Other specified counseling: Secondary | ICD-10-CM | POA: Diagnosis not present

## 2015-12-29 DIAGNOSIS — E876 Hypokalemia: Secondary | ICD-10-CM | POA: Diagnosis not present

## 2015-12-29 NOTE — Progress Notes (Signed)
Name: Larry Lawrence   MRN: [id number]    DOB: Jan 04, 1941   Date:12/29/2015       Progress Note  Subjective  Chief Complaint  Chief Complaint  Patient presents with  . Establish Care    HPI Comments: Patient presents for establishing primary care.    Congestive Heart Failure The disease course has been stable. Associated symptoms include edema, orthopnea and shortness of breath. Pertinent negatives include no abdominal pain, chest pain, chest pressure, claudication, fatigue, muscle weakness, near-syncope, nocturia, palpitations, paroxysmal nocturnal dyspnea or unexpected weight change. The symptoms have been stable. Past treatments include angiotensin receptor blockers, vasodilators, oxygen, salt and fluid restriction and beta blockers. The treatment provided moderate relief. Compliance with prior treatments has been good. His past medical history is significant for CAD and HTN. There is no history of anemia, arrhythmia, chronic lung disease, CVA, DM, DVT, hyperthyroidism, myocarditis, PE or valvular heart disease.    No problem-specific assessment & plan notes found for this encounter.   Past Medical History  Diagnosis Date  . CHF (congestive heart failure) (HCC)   . Renal disorder   . Coronary artery disease   . Allergy   . Gout   . Insomnia     Past Surgical History  Procedure Laterality Date  . Joint replacement    . Cardiac surgery      3 stents in "main artery"    Family History  Problem Relation Age of Onset  . Hypertension Mother   . Pancreatic cancer Mother   . Hypertension Father   . Prostate cancer Father     Social History   Social History  . Marital Status: Married    Spouse Name: N/A  . Number of Children: N/A  . Years of Education: N/A   Occupational History  . Not on file.   Social History Main Topics  . Smoking status: Former Games developermoker  . Smokeless tobacco: Not on file  . Alcohol Use: No  . Drug Use: Not on file  . Sexual Activity: Not on file    Other Topics Concern  . Not on file   Social History Narrative    Allergies  Allergen Reactions  . Niaspan [Niacin Er] Other (See Comments)    Reaction:  Hot flashes      Review of Systems  Constitutional: Negative for fever, chills, weight loss, malaise/fatigue, fatigue and unexpected weight change.  HENT: Negative for ear discharge, ear pain and sore throat.   Eyes: Negative for blurred vision.  Respiratory: Positive for shortness of breath. Negative for cough, sputum production and wheezing.   Cardiovascular: Negative for chest pain, palpitations, claudication, leg swelling and near-syncope.  Gastrointestinal: Negative for heartburn, nausea, abdominal pain, diarrhea, constipation, blood in stool and melena.  Genitourinary: Negative for dysuria, urgency, frequency, hematuria and nocturia.  Musculoskeletal: Positive for back pain. Negative for myalgias, joint pain, muscle weakness and neck pain.  Skin: Negative for rash.  Neurological: Negative for dizziness, tingling, sensory change, focal weakness and headaches.  Endo/Heme/Allergies: Negative for environmental allergies and polydipsia. Does not bruise/bleed easily.  Psychiatric/Behavioral: Negative for depression and suicidal ideas. The patient is not nervous/anxious and does not have insomnia.      Objective  Filed Vitals:   12/29/15 1451  BP: 138/100  Pulse: 68  Height: 5\' 11"  (1.803 m)  Weight: 231 lb (104.781 kg)    Physical Exam  Constitutional: He is oriented to person, place, and time and well-developed, well-nourished, and in no distress.  HENT:  Head: Normocephalic.  Right Ear: Tympanic membrane, external ear and ear canal normal.  Left Ear: Tympanic membrane, external ear and ear canal normal.  Nose: Nose normal.  Mouth/Throat: Oropharynx is clear and moist.  Eyes: Conjunctivae and EOM are normal. Pupils are equal, round, and reactive to light. Right eye exhibits no discharge. Left eye exhibits no  discharge. No scleral icterus.  Neck: Normal range of motion. Neck supple. No JVD present. No tracheal deviation present. No thyromegaly present.  Cardiovascular: Normal rate, regular rhythm, S1 normal, S2 normal, normal heart sounds, intact distal pulses and normal pulses.  Exam reveals no gallop, no S3, no S4 and no friction rub.   No murmur heard. JVD/ 2+ edema  Pulmonary/Chest: Effort normal and breath sounds normal. No respiratory distress. He has no wheezes. He has no rales.  Abdominal: Soft. Bowel sounds are normal. He exhibits no mass. There is no hepatosplenomegaly. There is no tenderness. There is no rebound, no guarding and no CVA tenderness.  Musculoskeletal: Normal range of motion. He exhibits no edema or tenderness.  Lymphadenopathy:    He has no cervical adenopathy.  Neurological: He is alert and oriented to person, place, and time. He has normal sensation, normal strength and intact cranial nerves. No cranial nerve deficit.  Skin: Skin is warm. No rash noted. There is pallor.  Psychiatric: Mood and affect normal.  Nursing note and vitals reviewed.     Assessment & Plan  Problem List Items Addressed This Visit      Cardiovascular and Mediastinum   CHF (congestive heart failure) (HCC) (Chronic)   Relevant Medications   aspirin 81 MG tablet   Other Relevant Orders   Ambulatory referral to Cardiology   Basic Metabolic Panel (BMET)   CBC   B Nat Peptide     Genitourinary   CKD (chronic kidney disease), stage III (Chronic)     Other   Hypokalemia   Relevant Orders   Basic Metabolic Panel (BMET)    Other Visit Diagnoses    Encounter to establish care with new doctor    -  Primary    Coronary artery disease due to calcified coronary lesion        stent in place    Relevant Medications    aspirin 81 MG tablet    Pallor        Relevant Orders    CBC      Explained to pt that they had to call insurance company to get PCP name changed before the cardiology visit  and the next visit with us. They also need to call concerning the O2 and hospital bed due incorrect PCP being on card   Dr. Elizabeth Sauereanna Jones [facility]  12/29/2015

## 2015-12-30 LAB — BASIC METABOLIC PANEL
BUN/Creatinine Ratio: 13 (ref 10–24)
BUN: 16 mg/dL (ref 8–27)
CALCIUM: 9.3 mg/dL (ref 8.6–10.2)
CO2: 25 mmol/L (ref 18–29)
CREATININE: 1.28 mg/dL — AB (ref 0.76–1.27)
Chloride: 102 mmol/L (ref 96–106)
GFR calc Af Amer: 64 mL/min/{1.73_m2} (ref >59)
GFR, EST NON AFRICAN AMERICAN: 55 mL/min/{1.73_m2} — AB (ref >59)
Glucose: 103 mg/dL — ABNORMAL HIGH (ref 65–99)
POTASSIUM: 4.2 mmol/L (ref 3.5–5.2)
Sodium: 142 mmol/L (ref 134–144)

## 2015-12-30 LAB — CBC
HEMOGLOBIN: 13.1 g/dL (ref 12.6–17.7)
Hematocrit: 40.2 % (ref 37.5–51.0)
MCH: 27.8 pg (ref 26.6–33.0)
MCHC: 32.6 g/dL (ref 31.5–35.7)
MCV: 85 fL (ref 79–97)
Platelets: 198 10*3/uL (ref 150–379)
RBC: 4.72 x10E6/uL (ref 4.14–5.80)
RDW: 16.8 % — ABNORMAL HIGH (ref 12.3–15.4)
WBC: 7 10*3/uL (ref 3.4–10.8)

## 2015-12-30 LAB — BRAIN NATRIURETIC PEPTIDE: BNP: 401.1 pg/mL — AB (ref 0.0–100.0)

## 2016-01-04 ENCOUNTER — Other Ambulatory Visit: Payer: Self-pay

## 2016-01-04 DIAGNOSIS — J309 Allergic rhinitis, unspecified: Secondary | ICD-10-CM

## 2016-01-04 DIAGNOSIS — Z9981 Dependence on supplemental oxygen: Secondary | ICD-10-CM | POA: Diagnosis not present

## 2016-01-04 DIAGNOSIS — I130 Hypertensive heart and chronic kidney disease with heart failure and stage 1 through stage 4 chronic kidney disease, or unspecified chronic kidney disease: Secondary | ICD-10-CM | POA: Diagnosis not present

## 2016-01-04 DIAGNOSIS — I080 Rheumatic disorders of both mitral and aortic valves: Secondary | ICD-10-CM | POA: Diagnosis not present

## 2016-01-04 DIAGNOSIS — G4700 Insomnia, unspecified: Secondary | ICD-10-CM

## 2016-01-04 DIAGNOSIS — M109 Gout, unspecified: Secondary | ICD-10-CM

## 2016-01-04 DIAGNOSIS — E876 Hypokalemia: Secondary | ICD-10-CM

## 2016-01-04 DIAGNOSIS — N183 Chronic kidney disease, stage 3 (moderate): Secondary | ICD-10-CM | POA: Diagnosis not present

## 2016-01-04 DIAGNOSIS — I10 Essential (primary) hypertension: Secondary | ICD-10-CM

## 2016-01-04 DIAGNOSIS — G4733 Obstructive sleep apnea (adult) (pediatric): Secondary | ICD-10-CM | POA: Diagnosis not present

## 2016-01-04 DIAGNOSIS — I509 Heart failure, unspecified: Secondary | ICD-10-CM | POA: Diagnosis not present

## 2016-01-04 DIAGNOSIS — E785 Hyperlipidemia, unspecified: Secondary | ICD-10-CM | POA: Diagnosis not present

## 2016-01-04 DIAGNOSIS — I2510 Atherosclerotic heart disease of native coronary artery without angina pectoris: Secondary | ICD-10-CM | POA: Diagnosis not present

## 2016-01-04 MED ORDER — LOSARTAN POTASSIUM 50 MG PO TABS: 50.0000 mg | ORAL_TABLET | Freq: Two times a day (BID) | ORAL | Status: DC

## 2016-01-04 MED ORDER — CARVEDILOL 12.5 MG PO TABS: 12.5000 mg | ORAL_TABLET | Freq: Two times a day (BID) | ORAL | Status: DC

## 2016-01-04 MED ORDER — HYDRALAZINE HCL 25 MG PO TABS: 25.0000 mg | ORAL_TABLET | Freq: Three times a day (TID) | ORAL | Status: DC

## 2016-01-04 MED ORDER — LORATADINE 10 MG PO TABS: 10.0000 mg | ORAL_TABLET | Freq: Every day | ORAL | Status: DC

## 2016-01-04 MED ORDER — POTASSIUM CHLORIDE CRYS ER 20 MEQ PO TBCR: 20.0000 meq | EXTENDED_RELEASE_TABLET | Freq: Two times a day (BID) | ORAL | Status: DC

## 2016-01-04 MED ORDER — TRAZODONE HCL 50 MG PO TABS: 50.0000 mg | ORAL_TABLET | Freq: Every day | ORAL | Status: DC

## 2016-01-04 MED ORDER — ALLOPURINOL 300 MG PO TABS
300.0000 mg | ORAL_TABLET | Freq: Two times a day (BID) | ORAL | Status: DC
Start: 2016-01-04 — End: 2016-08-06

## 2016-01-09 DIAGNOSIS — I5023 Acute on chronic systolic (congestive) heart failure: Secondary | ICD-10-CM | POA: Diagnosis not present

## 2016-01-09 DIAGNOSIS — R0602 Shortness of breath: Secondary | ICD-10-CM | POA: Diagnosis not present

## 2016-01-09 DIAGNOSIS — I2510 Atherosclerotic heart disease of native coronary artery without angina pectoris: Secondary | ICD-10-CM | POA: Diagnosis not present

## 2016-01-09 DIAGNOSIS — G4733 Obstructive sleep apnea (adult) (pediatric): Secondary | ICD-10-CM | POA: Diagnosis not present

## 2016-01-15 DIAGNOSIS — N183 Chronic kidney disease, stage 3 (moderate): Secondary | ICD-10-CM | POA: Diagnosis not present

## 2016-01-15 DIAGNOSIS — I130 Hypertensive heart and chronic kidney disease with heart failure and stage 1 through stage 4 chronic kidney disease, or unspecified chronic kidney disease: Secondary | ICD-10-CM | POA: Diagnosis not present

## 2016-01-15 DIAGNOSIS — I509 Heart failure, unspecified: Secondary | ICD-10-CM | POA: Diagnosis not present

## 2016-01-15 DIAGNOSIS — I080 Rheumatic disorders of both mitral and aortic valves: Secondary | ICD-10-CM | POA: Diagnosis not present

## 2016-01-15 DIAGNOSIS — E785 Hyperlipidemia, unspecified: Secondary | ICD-10-CM | POA: Diagnosis not present

## 2016-01-15 DIAGNOSIS — I2510 Atherosclerotic heart disease of native coronary artery without angina pectoris: Secondary | ICD-10-CM | POA: Diagnosis not present

## 2016-01-15 DIAGNOSIS — G4733 Obstructive sleep apnea (adult) (pediatric): Secondary | ICD-10-CM | POA: Diagnosis not present

## 2016-01-15 DIAGNOSIS — Z9981 Dependence on supplemental oxygen: Secondary | ICD-10-CM | POA: Diagnosis not present

## 2016-01-19 DIAGNOSIS — H40003 Preglaucoma, unspecified, bilateral: Secondary | ICD-10-CM | POA: Diagnosis not present

## 2016-01-22 DIAGNOSIS — E785 Hyperlipidemia, unspecified: Secondary | ICD-10-CM | POA: Diagnosis not present

## 2016-01-22 DIAGNOSIS — I509 Heart failure, unspecified: Secondary | ICD-10-CM | POA: Diagnosis not present

## 2016-01-22 DIAGNOSIS — I2510 Atherosclerotic heart disease of native coronary artery without angina pectoris: Secondary | ICD-10-CM | POA: Diagnosis not present

## 2016-01-22 DIAGNOSIS — I080 Rheumatic disorders of both mitral and aortic valves: Secondary | ICD-10-CM | POA: Diagnosis not present

## 2016-01-22 DIAGNOSIS — I130 Hypertensive heart and chronic kidney disease with heart failure and stage 1 through stage 4 chronic kidney disease, or unspecified chronic kidney disease: Secondary | ICD-10-CM | POA: Diagnosis not present

## 2016-01-22 DIAGNOSIS — G4733 Obstructive sleep apnea (adult) (pediatric): Secondary | ICD-10-CM | POA: Diagnosis not present

## 2016-01-22 DIAGNOSIS — Z9981 Dependence on supplemental oxygen: Secondary | ICD-10-CM | POA: Diagnosis not present

## 2016-01-22 DIAGNOSIS — N183 Chronic kidney disease, stage 3 (moderate): Secondary | ICD-10-CM | POA: Diagnosis not present

## 2016-01-23 DIAGNOSIS — I5023 Acute on chronic systolic (congestive) heart failure: Secondary | ICD-10-CM | POA: Diagnosis not present

## 2016-01-25 ENCOUNTER — Other Ambulatory Visit: Payer: Self-pay

## 2016-01-29 ENCOUNTER — Ambulatory Visit (INDEPENDENT_AMBULATORY_CARE_PROVIDER_SITE_OTHER): Payer: Medicare Other | Admitting: Family Medicine

## 2016-01-29 ENCOUNTER — Encounter: Payer: Self-pay | Admitting: Family Medicine

## 2016-01-29 VITALS — BP 140/88 | HR 64 | Ht 71.0 in | Wt 231.0 lb

## 2016-01-29 DIAGNOSIS — R0601 Orthopnea: Secondary | ICD-10-CM | POA: Diagnosis not present

## 2016-01-29 DIAGNOSIS — N183 Chronic kidney disease, stage 3 unspecified: Secondary | ICD-10-CM

## 2016-01-29 DIAGNOSIS — E876 Hypokalemia: Secondary | ICD-10-CM | POA: Diagnosis not present

## 2016-01-29 DIAGNOSIS — R413 Other amnesia: Secondary | ICD-10-CM | POA: Diagnosis not present

## 2016-01-29 DIAGNOSIS — G4734 Idiopathic sleep related nonobstructive alveolar hypoventilation: Secondary | ICD-10-CM | POA: Diagnosis not present

## 2016-01-29 DIAGNOSIS — I509 Heart failure, unspecified: Secondary | ICD-10-CM

## 2016-01-29 DIAGNOSIS — R2689 Other abnormalities of gait and mobility: Secondary | ICD-10-CM

## 2016-01-29 DIAGNOSIS — R29818 Other symptoms and signs involving the nervous system: Secondary | ICD-10-CM

## 2016-01-29 NOTE — Progress Notes (Signed)
Name: Larry Lawrence   MRN: [id number]    DOB: 1940-10-29   Date:01/29/2016       Progress Note  Subjective  Chief Complaint  Chief Complaint  Patient presents with  . Congestive Heart Failure    must sleep with head elevated in hospital bed due to orthopnea  . oxygen needed    must use oxygen at night due to O2 sats dropping while asleep if not on oxygen    Congestive Heart Failure  Presents for follow-up visit. Associated symptoms include nocturia, orthopnea and shortness of breath. Pertinent negatives include no abdominal pain, chest pain, chest pressure, claudication, edema, fatigue, muscle weakness, near-syncope, palpitations, paroxysmal nocturnal dyspnea or unexpected weight change. The symptoms have been stable. Compliance with total regimen is 76-100%. Compliance with diet is 76-100%. Compliance with exercise is 76-100%. Compliance with medications is 76-100%.    No problem-specific Assessment & Plan notes found for this encounter.   Past Medical History:  Diagnosis Date  . Allergy   . CHF (congestive heart failure) (HCC)   . Coronary artery disease   . Gout   . Insomnia   . Renal disorder   . Sleeps in sitting position due to orthopnea     Past Surgical History:  Procedure Laterality Date  . CARDIAC SURGERY     3 stents in "main artery"  . JOINT REPLACEMENT      Family History  Problem Relation Age of Onset  . Hypertension Mother   . Pancreatic cancer Mother   . Hypertension Father   . Prostate cancer Father     Social History   Social History  . Marital status: Married    Spouse name: N/A  . Number of children: N/A  . Years of education: N/A   Occupational History  . Not on file.   Social History Main Topics  . Smoking status: Former Games developer  . Smokeless tobacco: Never Used  . Alcohol use No  . Drug use: Unknown  . Sexual activity: Not Currently   Other Topics Concern  . Not on file   Social History Narrative  . No narrative on file     Allergies  Allergen Reactions  . Niaspan [Niacin Er] Other (See Comments)    Reaction:  Hot flashes      Review of Systems  Constitutional: Negative for chills, fatigue, fever, malaise/fatigue, unexpected weight change and weight loss.  HENT: Negative for ear discharge, ear pain and sore throat.   Eyes: Negative for blurred vision.  Respiratory: Positive for shortness of breath. Negative for cough, sputum production and wheezing.   Cardiovascular: Negative for chest pain, palpitations, claudication, leg swelling and near-syncope.  Gastrointestinal: Negative for abdominal pain, blood in stool, constipation, diarrhea, heartburn, melena and nausea.  Genitourinary: Positive for nocturia. Negative for dysuria, frequency, hematuria and urgency.  Musculoskeletal: Negative for back pain, joint pain, myalgias, muscle weakness and neck pain.  Skin: Negative for rash.  Neurological: Negative for dizziness, tingling, sensory change, focal weakness and headaches.  Endo/Heme/Allergies: Negative for environmental allergies and polydipsia. Does not bruise/bleed easily.  Psychiatric/Behavioral: Negative for depression and suicidal ideas. The patient is not nervous/anxious and does not have insomnia.      Objective  Vitals:   01/29/16 1013  BP: 140/88  Pulse: 64  SpO2: 99%  Weight: 231 lb (104.8 kg)  Height:  (1.803 m)    Physical Exam  Constitutional: He is oriented to person, place, and time and well-developed, well-nourished, and in no  distress.  HENT:  Head: Normocephalic.  Right Ear: External ear normal.  Left Ear: External ear normal.  Nose: Nose normal.  Mouth/Throat: Oropharynx is clear and moist.  Eyes: Conjunctivae and EOM are normal. Pupils are equal, round, and reactive to light. Right eye exhibits no discharge. Left eye exhibits no discharge. No scleral icterus.  Neck: Normal range of motion. Neck supple. No JVD present. No tracheal deviation present. No thyromegaly  present.  Cardiovascular: Normal rate, regular rhythm, S1 normal, S2 normal, normal heart sounds and intact distal pulses.  Exam reveals no gallop, no S3, no S4 and no friction rub.   No murmur heard. Pulmonary/Chest: Breath sounds normal. No respiratory distress. He has no wheezes. He has no rales.  Abdominal: Soft. Bowel sounds are normal. He exhibits no mass. There is no hepatosplenomegaly. There is no tenderness. There is no rebound, no guarding and no CVA tenderness.  Musculoskeletal: Normal range of motion. He exhibits no edema or tenderness.  Lymphadenopathy:    He has no cervical adenopathy.  Neurological: He is alert and oriented to person, place, and time. He has normal sensation, normal strength, normal reflexes and intact cranial nerves. No cranial nerve deficit.  Skin: Skin is warm. No rash noted.  Psychiatric: Mood and affect normal.      Assessment & Plan  Problem List Items Addressed This Visit      Cardiovascular and Mediastinum   CHF (congestive heart failure) (HCC) - Primary (Chronic)     Genitourinary   CKD (chronic kidney disease), stage III (Chronic)     Other   Hypokalemia    Other Visit Diagnoses    Orthopnea       hospital bed   Nocturnal hypoxia       oxygen at night   Short-term memory loss       Balance problem       encourage walker     Pt is encouraged to get a walker in the place of cane, hospital bed and night oxygen   Dr. [facility]  01/29/16  Face to face visit for insurance purposes for following items: hosp bed, nighttime O2 and walker with wheels/with seat

## 2016-01-30 DIAGNOSIS — R600 Localized edema: Secondary | ICD-10-CM | POA: Diagnosis not present

## 2016-01-30 DIAGNOSIS — I10 Essential (primary) hypertension: Secondary | ICD-10-CM | POA: Diagnosis not present

## 2016-01-30 DIAGNOSIS — R0602 Shortness of breath: Secondary | ICD-10-CM | POA: Diagnosis not present

## 2016-01-30 DIAGNOSIS — I5022 Chronic systolic (congestive) heart failure: Secondary | ICD-10-CM | POA: Diagnosis not present

## 2016-01-30 DIAGNOSIS — I2510 Atherosclerotic heart disease of native coronary artery without angina pectoris: Secondary | ICD-10-CM | POA: Diagnosis not present

## 2016-01-31 ENCOUNTER — Other Ambulatory Visit: Payer: Self-pay

## 2016-02-01 ENCOUNTER — Emergency Department: Payer: Medicare Other

## 2016-02-01 ENCOUNTER — Observation Stay
Admission: EM | Admit: 2016-02-01 | Discharge: 2016-02-05 | Disposition: A | Discharge status: Home / Self Care | Payer: Medicare Other | Attending: Internal Medicine | Admitting: Internal Medicine

## 2016-02-01 ENCOUNTER — Encounter: Payer: Self-pay | Admitting: Emergency Medicine

## 2016-02-01 DIAGNOSIS — R55 Syncope and collapse: Secondary | ICD-10-CM | POA: Diagnosis not present

## 2016-02-01 DIAGNOSIS — R401 Stupor: Secondary | ICD-10-CM | POA: Diagnosis not present

## 2016-02-01 DIAGNOSIS — Z8249 Family history of ischemic heart disease and other diseases of the circulatory system: Secondary | ICD-10-CM | POA: Diagnosis not present

## 2016-02-01 DIAGNOSIS — I472 Ventricular tachycardia: Secondary | ICD-10-CM | POA: Diagnosis not present

## 2016-02-01 DIAGNOSIS — R008 Other abnormalities of heart beat: Secondary | ICD-10-CM | POA: Diagnosis not present

## 2016-02-01 DIAGNOSIS — Z888 Allergy status to other drugs, medicaments and biological substances status: Secondary | ICD-10-CM | POA: Insufficient documentation

## 2016-02-01 DIAGNOSIS — G4700 Insomnia, unspecified: Secondary | ICD-10-CM | POA: Diagnosis not present

## 2016-02-01 DIAGNOSIS — I700 Atherosclerosis of aorta: Secondary | ICD-10-CM | POA: Diagnosis not present

## 2016-02-01 DIAGNOSIS — Z87891 Personal history of nicotine dependence: Secondary | ICD-10-CM | POA: Insufficient documentation

## 2016-02-01 DIAGNOSIS — I499 Cardiac arrhythmia, unspecified: Secondary | ICD-10-CM | POA: Diagnosis present

## 2016-02-01 DIAGNOSIS — I5022 Chronic systolic (congestive) heart failure: Secondary | ICD-10-CM | POA: Diagnosis not present

## 2016-02-01 DIAGNOSIS — I498 Other specified cardiac arrhythmias: Secondary | ICD-10-CM

## 2016-02-01 DIAGNOSIS — Z800 Family history of malignant neoplasm of digestive organs: Secondary | ICD-10-CM | POA: Insufficient documentation

## 2016-02-01 DIAGNOSIS — Z8042 Family history of malignant neoplasm of prostate: Secondary | ICD-10-CM | POA: Insufficient documentation

## 2016-02-01 DIAGNOSIS — Z955 Presence of coronary angioplasty implant and graft: Secondary | ICD-10-CM | POA: Insufficient documentation

## 2016-02-01 DIAGNOSIS — I429 Cardiomyopathy, unspecified: Secondary | ICD-10-CM | POA: Insufficient documentation

## 2016-02-01 DIAGNOSIS — I471 Supraventricular tachycardia: Secondary | ICD-10-CM | POA: Insufficient documentation

## 2016-02-01 DIAGNOSIS — Z9660 Presence of unspecified orthopedic joint implant: Secondary | ICD-10-CM | POA: Insufficient documentation

## 2016-02-01 DIAGNOSIS — H409 Unspecified glaucoma: Secondary | ICD-10-CM | POA: Insufficient documentation

## 2016-02-01 DIAGNOSIS — M109 Gout, unspecified: Secondary | ICD-10-CM | POA: Diagnosis not present

## 2016-02-01 DIAGNOSIS — R0601 Orthopnea: Secondary | ICD-10-CM | POA: Diagnosis not present

## 2016-02-01 DIAGNOSIS — N183 Chronic kidney disease, stage 3 (moderate): Secondary | ICD-10-CM | POA: Diagnosis not present

## 2016-02-01 DIAGNOSIS — I2510 Atherosclerotic heart disease of native coronary artery without angina pectoris: Secondary | ICD-10-CM | POA: Diagnosis not present

## 2016-02-01 DIAGNOSIS — E876 Hypokalemia: Secondary | ICD-10-CM | POA: Diagnosis present

## 2016-02-01 DIAGNOSIS — Z7982 Long term (current) use of aspirin: Secondary | ICD-10-CM | POA: Insufficient documentation

## 2016-02-01 DIAGNOSIS — I130 Hypertensive heart and chronic kidney disease with heart failure and stage 1 through stage 4 chronic kidney disease, or unspecified chronic kidney disease: Secondary | ICD-10-CM | POA: Insufficient documentation

## 2016-02-01 DIAGNOSIS — R42 Dizziness and giddiness: Secondary | ICD-10-CM | POA: Diagnosis not present

## 2016-02-01 DIAGNOSIS — Z8673 Personal history of transient ischemic attack (TIA), and cerebral infarction without residual deficits: Secondary | ICD-10-CM | POA: Diagnosis not present

## 2016-02-01 DIAGNOSIS — R531 Weakness: Secondary | ICD-10-CM | POA: Diagnosis not present

## 2016-02-01 DIAGNOSIS — Z79899 Other long term (current) drug therapy: Secondary | ICD-10-CM | POA: Insufficient documentation

## 2016-02-01 DIAGNOSIS — I739 Peripheral vascular disease, unspecified: Secondary | ICD-10-CM | POA: Diagnosis not present

## 2016-02-01 DIAGNOSIS — N179 Acute kidney failure, unspecified: Secondary | ICD-10-CM | POA: Diagnosis present

## 2016-02-01 HISTORY — DX: Transient cerebral ischemic attack, unspecified: G45.9

## 2016-02-01 LAB — BASIC METABOLIC PANEL
Anion gap: 12 (ref 5–15)
BUN: 18 mg/dL (ref 6–20)
CHLORIDE: 101 mmol/L (ref 101–111)
CO2: 27 mmol/L (ref 22–32)
CREATININE: 1.78 mg/dL — AB (ref 0.61–1.24)
Calcium: 8.7 mg/dL — ABNORMAL LOW (ref 8.9–10.3)
GFR, EST AFRICAN AMERICAN: 42 mL/min — AB (ref >60)
GFR, EST NON AFRICAN AMERICAN: 36 mL/min — AB (ref >60)
Glucose, Bld: 144 mg/dL — ABNORMAL HIGH (ref 65–99)
POTASSIUM: 3.1 mmol/L — AB (ref 3.5–5.1)
Sodium: 140 mmol/L (ref 135–145)

## 2016-02-01 LAB — CBC
HCT: 42.2 % (ref 40.0–52.0)
Hemoglobin: 14 g/dL (ref 13.0–18.0)
MCH: 28.9 pg (ref 26.0–34.0)
MCHC: 33.2 g/dL (ref 32.0–36.0)
MCV: 87.1 fL (ref 80.0–100.0)
PLATELETS: 185 10*3/uL (ref 150–440)
RBC: 4.85 MIL/uL (ref 4.40–5.90)
RDW: 18.5 % — AB (ref 11.5–14.5)
WBC: 9.1 10*3/uL (ref 3.8–10.6)

## 2016-02-01 LAB — TROPONIN I
Troponin I: 0.03 ng/mL (ref <0.03)
Troponin I: 0.03 ng/mL (ref <0.03)

## 2016-02-01 MED ORDER — LORATADINE 10 MG PO TABS
10.0000 mg | ORAL_TABLET | Freq: Every day | ORAL | Status: DC
  Administered 2016-02-02: 10 mg via ORAL
  Filled 2016-02-01: qty 1

## 2016-02-01 MED ORDER — ASPIRIN EC 81 MG PO TBEC
81.0000 mg | DELAYED_RELEASE_TABLET | Freq: Every day | ORAL | Status: DC
  Administered 2016-02-02 – 2016-02-05 (×4): 81 mg via ORAL
  Filled 2016-02-01 (×5): qty 1

## 2016-02-01 MED ORDER — POTASSIUM CHLORIDE CRYS ER 20 MEQ PO TBCR
20.0000 meq | EXTENDED_RELEASE_TABLET | Freq: Two times a day (BID) | ORAL | Status: DC
  Administered 2016-02-02 – 2016-02-05 (×7): 20 meq via ORAL
  Filled 2016-02-01 (×7): qty 1

## 2016-02-01 MED ORDER — POTASSIUM CHLORIDE CRYS ER 20 MEQ PO TBCR
40.0000 meq | EXTENDED_RELEASE_TABLET | Freq: Once | ORAL | Status: AC
Start: 2016-02-01 — End: 2016-02-01
  Administered 2016-02-01: 40 meq via ORAL
  Filled 2016-02-01: qty 2

## 2016-02-01 MED ORDER — ALLOPURINOL 100 MG PO TABS
300.0000 mg | ORAL_TABLET | Freq: Two times a day (BID) | ORAL | Status: DC
  Administered 2016-02-01 – 2016-02-05 (×8): 300 mg via ORAL
  Filled 2016-02-01 (×8): qty 3

## 2016-02-01 MED ORDER — ACETAMINOPHEN 325 MG PO TABS: 650.0000 mg | ORAL_TABLET | Freq: Four times a day (QID) | ORAL | Status: DC | PRN

## 2016-02-01 MED ORDER — ONDANSETRON HCL 4 MG/2ML IJ SOLN
4.0000 mg | Freq: Four times a day (QID) | INTRAMUSCULAR | Status: DC | PRN
Start: 2016-02-01 — End: 2016-02-02

## 2016-02-01 MED ORDER — ISOSORBIDE MONONITRATE ER 30 MG PO TB24
30.0000 mg | ORAL_TABLET | Freq: Every day | ORAL | Status: DC
  Administered 2016-02-02 – 2016-02-05 (×4): 30 mg via ORAL
  Filled 2016-02-01 (×4): qty 1

## 2016-02-01 MED ORDER — HEPARIN SODIUM (PORCINE) 5000 UNIT/ML IJ SOLN
5000.0000 [IU] | Freq: Three times a day (TID) | INTRAMUSCULAR | Status: DC
  Administered 2016-02-01 – 2016-02-05 (×9): 5000 [IU] via SUBCUTANEOUS
  Filled 2016-02-01 (×9): qty 1

## 2016-02-01 MED ORDER — LABETALOL HCL 5 MG/ML IV SOLN
10.0000 mg | INTRAVENOUS | Status: DC | PRN
  Administered 2016-02-01: 10 mg via INTRAVENOUS
  Filled 2016-02-01: qty 4

## 2016-02-01 MED ORDER — CARVEDILOL 12.5 MG PO TABS
12.5000 mg | ORAL_TABLET | Freq: Two times a day (BID) | ORAL | Status: DC
  Administered 2016-02-02 – 2016-02-05 (×6): 12.5 mg via ORAL
  Filled 2016-02-01: qty 1
  Filled 2016-02-01: qty 2
  Filled 2016-02-01 (×4): qty 1

## 2016-02-01 MED ORDER — FUROSEMIDE 40 MG PO TABS
40.0000 mg | ORAL_TABLET | Freq: Every day | ORAL | Status: DC
  Administered 2016-02-02 – 2016-02-04 (×3): 40 mg via ORAL
  Filled 2016-02-01 (×3): qty 1

## 2016-02-01 MED ORDER — TRAZODONE HCL 50 MG PO TABS
50.0000 mg | ORAL_TABLET | Freq: Every day | ORAL | Status: DC
  Administered 2016-02-01 – 2016-02-04 (×4): 50 mg via ORAL
  Filled 2016-02-01 (×4): qty 1

## 2016-02-01 MED ORDER — SODIUM CHLORIDE 0.9% FLUSH
3.0000 mL | Freq: Two times a day (BID) | INTRAVENOUS | Status: DC
  Administered 2016-02-01 – 2016-02-03 (×3): 3 mL via INTRAVENOUS

## 2016-02-01 MED ORDER — LOSARTAN POTASSIUM 50 MG PO TABS
50.0000 mg | ORAL_TABLET | Freq: Two times a day (BID) | ORAL | Status: DC
  Administered 2016-02-01 – 2016-02-05 (×8): 50 mg via ORAL
  Filled 2016-02-01 (×4): qty 1
  Filled 2016-02-01: qty 2
  Filled 2016-02-01: qty 1
  Filled 2016-02-01: qty 2
  Filled 2016-02-01: qty 1

## 2016-02-01 MED ORDER — ONDANSETRON HCL 4 MG PO TABS: 4.0000 mg | ORAL_TABLET | Freq: Four times a day (QID) | ORAL | Status: DC | PRN

## 2016-02-01 MED ORDER — TIMOLOL MALEATE 0.5 % OP SOLN
1.0000 [drp] | Freq: Two times a day (BID) | OPHTHALMIC | Status: DC
  Administered 2016-02-01 – 2016-02-04 (×7): 1 [drp] via OPHTHALMIC
  Filled 2016-02-01: qty 5

## 2016-02-01 NOTE — ED Triage Notes (Signed)
Pt reports acute onset of "not feeling right" and dizziness at 1600 today. Did have some SHOB at time. Denies pain or SHOB now. Hx of CHF, cardiac stents, and TIA

## 2016-02-01 NOTE — H&P (Signed)
[facility] [place] [facility]   PATIENT NAME: Larry Lawrence    MR#:  [id number]  DATE OF BIRTH:  Nov 18, 1940  DATE OF ADMISSION:  02/01/2016  PRIMARY CARE PHYSICIAN: No PCP Per Patient   REQUESTING/REFERRING PHYSICIAN: Cyril Loosen, MD  CHIEF COMPLAINT:   Chief Complaint  Patient presents with  . Dizziness    HISTORY OF PRESENT ILLNESS:  Larry Lawrence  is a 75 y.o. male who presents with Near syncopal/mild syncopal episode. Patient states that he was sitting at home today when he became all of a sudden lightheaded and had blurred vision and had somewhat of a blackout. Patient is unsure if he syncopized completely or not or simply had a near syncope. He came in the ED for evaluation. Of note he does have a history of chronic systolic heart failure with an EF recently checked by echocardiogram of 30%. He follows with Dr. Gwen Pounds. Here in the ED he was found to be in bigeminy on EKG. Later, during this interviewer's exam the patient was noted on monitor to have a few short runs of V. tach. During one of these runs he did state that he began to feel a little "funny" in his head. Hospitalists were asked to admit for further evaluation.  PAST MEDICAL HISTORY:   Past Medical History:  Diagnosis Date  . Allergy   . CHF (congestive heart failure) (HCC)   . Coronary artery disease   . Gout   . Insomnia   . Renal disorder   . Sleeps in sitting position due to orthopnea   . TIA (transient ischemic attack)     PAST SURGICAL HISTORY:   Past Surgical History:  Procedure Laterality Date  . CARDIAC SURGERY     3 stents in "main artery"  . JOINT REPLACEMENT      SOCIAL HISTORY:   Social History  Substance Use Topics  . Smoking status: Former Games developer  . Smokeless tobacco: Never Used  . Alcohol use No    FAMILY HISTORY:   Family History  Problem Relation Age of Onset  . Hypertension Mother   . Pancreatic cancer Mother   . Hypertension Father   . Prostate  cancer Father     DRUG ALLERGIES:   Allergies  Allergen Reactions  . Niaspan [Niacin Er] Other (See Comments)    Reaction:  Hot flashes     MEDICATIONS AT HOME:   Prior to Admission medications   Medication Sig Start Date End Date Taking? Authorizing Provider  acetaminophen (TYLENOL) 500 MG tablet Take 1,000 mg by mouth every 6 (six) hours as needed for mild pain or headache.     Historical Provider, MD  allopurinol (ZYLOPRIM) 300 MG tablet Take 1 tablet (300 mg total) by mouth 2 (two) times daily. 01/04/16   Duanne Limerick, MD  aspirin 81 MG tablet Take 81 mg by mouth daily.    Historical Provider, MD  carvedilol (COREG) 12.5 MG tablet Take 1 tablet (12.5 mg total) by mouth 2 (two) times daily with a meal. 01/04/16   Duanne Limerick, MD  furosemide (LASIX) 40 MG tablet Take 40 mg by mouth daily. Take 2 tabs in the morning and 1 tab in the evening    Historical Provider, MD  hydrALAZINE (APRESOLINE) 25 MG tablet Take 1 tablet (25 mg total) by mouth 3 (three) times daily. 01/04/16   Duanne Limerick, MD  isosorbide mononitrate (IMDUR) 30 MG 24 hr tablet Take 30 mg by mouth daily.  Historical Provider, MD  loratadine (CLARITIN) 10 MG tablet Take 1 tablet (10 mg total) by mouth daily. 01/04/16   Duanne Limerick, MD  losartan (COZAAR) 50 MG tablet Take 1 tablet (50 mg total) by mouth 2 (two) times daily. 01/04/16   Duanne Limerick, MD  metolazone (ZAROXOLYN) 2.5 MG tablet Take 2.5 mg by mouth every Monday, Wednesday, and Friday. Take 5mg  (2 tabs) 4 days a week    Historical Provider, MD  potassium chloride SA (K-DUR,KLOR-CON) 20 MEQ tablet Take 1 tablet (20 mEq total) by mouth 2 (two) times daily. 01/04/16   Duanne Limerick, MD  timolol (TIMOPTIC) 0.5 % ophthalmic solution Place 1 drop into both eyes 2 (two) times daily.     Historical Provider, MD  traZODone (DESYREL) 50 MG tablet Take 1 tablet (50 mg total) by mouth at bedtime. 01/04/16   Duanne Limerick, MD    REVIEW OF SYSTEMS:  Review of Systems   Constitutional: Negative for chills, fever, malaise/fatigue and weight loss.  HENT: Negative for ear pain, hearing loss and tinnitus.   Eyes: Positive for blurred vision (During his syncopal/near syncopal episode). Negative for double vision, pain and redness.  Respiratory: Negative for cough, hemoptysis and shortness of breath.   Cardiovascular: Negative for chest pain, palpitations, orthopnea and leg swelling.       Lightheadedness associated with his syncopal/near syncopal episode  Gastrointestinal: Negative for abdominal pain, constipation, diarrhea, nausea and vomiting.  Genitourinary: Negative for dysuria, frequency and hematuria.  Musculoskeletal: Negative for back pain, joint pain and neck pain.  Skin:       No acne, rash, or lesions  Neurological: Positive for loss of consciousness (Patient is unsure if he actually lost consciousness, though states if he did it was only for a couple of seconds). Negative for dizziness, tremors, focal weakness and weakness.  Endo/Heme/Allergies: Negative for polydipsia. Does not bruise/bleed easily.  Psychiatric/Behavioral: Negative for depression. The patient is not nervous/anxious and does not have insomnia.      VITAL SIGNS:   Vitals:   02/01/16 1830 02/01/16 1900 02/01/16 1915 02/01/16 1930  BP: (!) 134/48 (!) 150/65  (!) 158/80  Pulse: (!) 58 69  (!) 29  Resp: (!) 27 15 18 16   Temp:      TempSrc:      SpO2: 98% 98%  97%  Weight:      Height:       Wt Readings from Last 3 Encounters:  02/01/16 105.2 kg (232 lb)  01/29/16 104.8 kg (231 lb)  12/29/15 104.8 kg (231 lb)    PHYSICAL EXAMINATION:  Physical Exam  Vitals reviewed. Constitutional: He is oriented to person, place, and time. He appears well-developed and well-nourished. No distress.  HENT:  Head: Normocephalic and atraumatic.  Mouth/Throat: Oropharynx is clear and moist.  Eyes: Conjunctivae and EOM are normal. Pupils are equal, round, and reactive to light. No scleral  icterus.  Neck: Normal range of motion. Neck supple. No JVD present. No thyromegaly present.  Cardiovascular: Normal rate and intact distal pulses.  Exam reveals no gallop and no friction rub.   No murmur heard. Patient has a normal rate, but has frequent PVCs auscultated as well as brief periods of tachycardia corresponding to his short runs of V. tach on the monitor  Respiratory: Effort normal and breath sounds normal. No respiratory distress. He has no wheezes. He has no rales.  GI: Soft. Bowel sounds are normal. He exhibits no distension. There is no tenderness.  Musculoskeletal: Normal range of motion. He exhibits no edema.  No arthritis, no gout  Lymphadenopathy:    He has no cervical adenopathy.  Neurological: He is alert and oriented to person, place, and time. No cranial nerve deficit.  No dysarthria, no aphasia  Skin: Skin is warm and dry. No rash noted. No erythema.  Psychiatric: He has a normal mood and affect. His behavior is normal. Judgment and thought content normal.    LABORATORY PANEL:   CBC  Recent Labs Lab 02/01/16 1813  WBC 9.1  HGB 14.0  HCT 42.2  PLT 185   ------------------------------------------------------------------------------------------------------------------  Chemistries   Recent Labs Lab 02/01/16 1813  NA 140  K 3.1*  CL 101  CO2 27  GLUCOSE 144*  BUN 18  CREATININE 1.78*  CALCIUM 8.7*   ------------------------------------------------------------------------------------------------------------------  Cardiac Enzymes  Recent Labs Lab 02/01/16 1813  TROPONINI 0.03*   ------------------------------------------------------------------------------------------------------------------  RADIOLOGY:  Dg Chest Portable 1 View  Result Date: 02/01/2016 CLINICAL DATA:  75 year old male with malaise and dizziness since 1600 hours. Initial encounter. EXAM: PORTABLE CHEST 1 VIEW COMPARISON:  08/09/2015 and earlier. FINDINGS: Portable AP  upright view at 1937 hours. Improved lung volumes and basilar ventilation. Stable cardiomegaly and mediastinal contours. Calcified aortic atherosclerosis. Visualized tracheal air column is within normal limits. No pneumothorax, pulmonary edema or acute pulmonary opacity. IMPRESSION: No acute cardiopulmonary abnormality. Mild cardiomegaly.  Calcified aortic atherosclerosis. Electronically Signed   By: Odessa Fleming M.D.   On: 02/01/2016 19:54    EKG:   Orders placed or performed during the hospital encounter of 02/01/16  . EKG 12-Lead  . EKG 12-Lead  . ED EKG  . ED EKG    IMPRESSION AND PLAN:  Principal Problem:   Syncope - admit for observation to telemetry, cycle his cardiac enzymes, continue his home meds, if he begins to have very frequent episodes of V. tach we may have to consult cardiology earlier tonight. Otherwise we'll have cardiology see him in the morning. We'll defer to their recommendation as to whether or not they want another echocardiogram as he just had one done last month. Active Problems:   Chronic systolic CHF (congestive heart failure) (HCC) - continue home meds. EF is 30%, potentially predisposing him to these arrhythmias. However, he does not seem to be in acute exacerbation.   CKD (chronic kidney disease), stage III - stable, monitor, avoid nephrotoxins   Hypokalemia - given by mouth replacement of potassium in the ED, monitor and continue to replace as needed   Gout - continue home meds  All the records are reviewed and case discussed with ED provider. Management plans discussed with the patient and/or family.  DVT PROPHYLAXIS: SubQ heparin  GI PROPHYLAXIS: None  ADMISSION STATUS: Observation  CODE STATUS: DNR Code Status History    Date Active Date Inactive Code Status Order ID Comments User Context   08/09/2015  4:44 PM 08/11/2015  4:54 PM DNR [id number]  Wyatt Haste, MD ED   08/09/2015  4:43 PM 08/09/2015  4:44 PM Full Code [id number]  Wyatt Haste, MD ED    02/20/2015  9:37 AM 02/21/2015  7:16 PM DNR [id number]  Crissie Figures, MD Inpatient    Questions for Most Recent Historical Code Status (Order [id number])    Question Answer Comment   In the event of cardiac or respiratory ARREST Do not call a "code blue"    In the event of cardiac or respiratory ARREST Do not perform Intubation, CPR, defibrillation or ACLS  In the event of cardiac or respiratory ARREST Use medication by any route, position, wound care, and other measures to relive pain and suffering. May use oxygen, suction and manual treatment of airway obstruction as needed for comfort.       TOTAL TIME TAKING CARE OF THIS PATIENT: 40 minutes.    Sheena Donegan FIELDING 02/01/2016, 8:48 PM  [facility]  Office  [phone]  CC: Primary care physician; No PCP Per Patient

## 2016-02-01 NOTE — Progress Notes (Signed)
Pt takes trazadone & hydralazine at home, these medications were not transferred over, MD paged. Dr. Anne Hahn to put home dose of trazadone in, but is holding hydralazine at this time, PRN orders for labetolol in for high BP. No further concerns, will continue to monitor. Shirley Friar, RN

## 2016-02-01 NOTE — ED Provider Notes (Signed)
[facility] Emergency Department Provider Note   ____________________________________________    I have reviewed the triage vital signs and the nursing notes.   HISTORY  Chief Complaint Dizziness     HPI Larry Lawrence is a 75 y.o. male who presents after an episode of near-syncope. Patient reports that he was sitting in a chair for approximately 30 minutes at which point he became very lightheaded and thought that he might pass out.Subsequently he felt nauseous but denies chest pain, palpitations, shortness of breath.Currently he feels well and has no complaints.his wife reports that he recently came off of hospice because his CHF has improved  Past Medical History:  Diagnosis Date  . Allergy   . CHF (congestive heart failure) (HCC)   . Coronary artery disease   . Gout   . Insomnia   . Renal disorder   . Sleeps in sitting position due to orthopnea   . TIA (transient ischemic attack)     Patient Active Problem List   Diagnosis Date Noted  . Syncope 08/09/2015  . Hypokalemia 08/09/2015  . CKD (chronic kidney disease), stage III 02/20/2015  . CHF (congestive heart failure) (HCC) 02/20/2015  . CKD (chronic kidney disease) 02/20/2015    Past Surgical History:  Procedure Laterality Date  . CARDIAC SURGERY     3 stents in "main artery"  . JOINT REPLACEMENT      Prior to Admission medications   Medication Sig Start Date End Date Taking? Authorizing Provider  acetaminophen (TYLENOL) 500 MG tablet Take 1,000 mg by mouth every 6 (six) hours as needed for mild pain or headache.     Historical Provider, MD  allopurinol (ZYLOPRIM) 300 MG tablet Take 1 tablet (300 mg total) by mouth 2 (two) times daily. 01/04/16   Duanne Limerick, MD  aspirin 81 MG tablet Take 81 mg by mouth daily.    Historical Provider, MD  carvedilol (COREG) 12.5 MG tablet Take 1 tablet (12.5 mg total) by mouth 2 (two) times daily with a meal. 01/04/16   Duanne Limerick, MD    furosemide (LASIX) 40 MG tablet Take 40 mg by mouth daily. Take 2 tabs in the morning and 1 tab in the evening    Historical Provider, MD  hydrALAZINE (APRESOLINE) 25 MG tablet Take 1 tablet (25 mg total) by mouth 3 (three) times daily. 01/04/16   Duanne Limerick, MD  isosorbide mononitrate (IMDUR) 30 MG 24 hr tablet Take 30 mg by mouth daily.    Historical Provider, MD  loratadine (CLARITIN) 10 MG tablet Take 1 tablet (10 mg total) by mouth daily. 01/04/16   Duanne Limerick, MD  losartan (COZAAR) 50 MG tablet Take 1 tablet (50 mg total) by mouth 2 (two) times daily. 01/04/16   Duanne Limerick, MD  metolazone (ZAROXOLYN) 2.5 MG tablet Take 2.5 mg by mouth every Monday, Wednesday, and Friday. Take  (2 tabs) 4 days a week    Historical Provider, MD  potassium chloride SA (K-DUR,KLOR-CON) 20 MEQ tablet Take 1 tablet (20 mEq total) by mouth 2 (two) times daily. 01/04/16   Duanne Limerick, MD  timolol (TIMOPTIC) 0.5 % ophthalmic solution Place 1 drop into both eyes 2 (two) times daily.     Historical Provider, MD  traZODone (DESYREL) 50 MG tablet Take 1 tablet (50 mg total) by mouth at bedtime. 01/04/16   Duanne Limerick, MD     Allergies Niaspan [niacin er]  Family History  Problem  Relation Age of Onset  . Hypertension Mother   . Pancreatic cancer Mother   . Hypertension Father   . Prostate cancer Father     Social History Social History  Substance Use Topics  . Smoking status: Former Games developer  . Smokeless tobacco: Never Used  . Alcohol use No    Review of Systems  Constitutional: No fever/chills Eyes: No visual changes.  ENT: No sore throat. Cardiovascular: Denies chest pain. Respiratory: Denies shortness of breath. Gastrointestinal: No abdominal pain.  .   Genitourinary: Negative for dysuria. Musculoskeletal: Negative foNo pain  Negative for rash. Neurological: Negative for headaches   10-point ROS otherwise negative.  ____________________________________________   PHYSICAL  EXAM:  VITAL SIGNS: ED Triage Vitals [02/01/16 1805]  Enc Vitals Group     BP (!) 175/83     Pulse Rate 76     Resp 17     Temp 98.9 F (37.2 C)     Temp Source Oral     SpO2 98 %     Weight 232 lb (105.2 kg)     Height 5\' 11"  (1.803 m)     Head Circumference      Peak Flow      Pain Score      Pain Loc      Pain Edu?      Excl. in GC?     Constitutional: Alert and oriented. No acute distress. Pleasant and interactive Eyes: Conjunctivae are normal.  Head: Atraumatic. Nose: No congestion/rhinnorhea. Mouth/Throat: Mucous membranes are moist.   Neck:  Painless ROM Cardiovascular: Normal rate, regular rhythm. Grossly normal heart sounds.  Good peripheral circulation. Respiratory: Normal respiratory effort.  No retractions. Lungs CTAB. Gastrointestinal: Soft and nontender. No distention.  No CVA tenderness. Genitourinary: deferred Musculoskeletal 1+ lower extremity edema bilaterally and well perfused Neurologic:  Normal speech and language. No gross focal neurologic deficits are appreciated.  Skin:  Skin is warm, dry and intact. No rash noted. Psychiatric: Mood and affect are normal. Speech and behavior are normal.  ____________________________________________   LABS (all labs ordered are listed, but only abnormal results are displayed)  Labs Reviewed  BASIC METABOLIC PANEL - Abnormal; Notable for the following:       Result Value   Potassium 3.1 (*)    Glucose, Bld 144 (*)    Creatinine, Ser 1.78 (*)    Calcium 8.7 (*)    GFR calc non Af Amer 36 (*)    GFR calc Af Amer 42 (*)    All other components within normal limits  CBC - Abnormal; Notable for the following:    RDW 18.5 (*)    All other components within normal limits  TROPONIN I - Abnormal; Notable for the following:    Troponin I 0.03 (*)    All other components within normal limits  URINALYSIS COMPLETEWITH MICROSCOPIC ([facility] ONLY)   ____________________________________________  EKG  ED ECG  REPORT I, Jene Every, the attending physician, personally viewed and interpreted this ECG.  Date: 02/01/2016 EKG Time: 5:29 PM Rate: 72 Rhythm: normal sinus rhythm QRS Axis: normal Intervals: normal ST/T Wave abnormalities: normal Conduction Disturbances: none Narrative Interpretation: unremarkable  ____________________________________________  RADIOLOGY  None ____________________________________________   PROCEDURES  Procedure(s) performed: No    Critical Care performed: No ____________________________________________   INITIAL IMPRESSION / ASSESSMENT AND PLAN / ED COURSE  Pertinent labs & imaging results that were available during my care of the patient were reviewed by me and considered in my medical decision  making (see chart for details).  Patient presents after a near syncopal episode. He has new EKG changes consistent with bigeminy. He continues to feel dizzy although his orthostatics are unremarkable. Given his significant cardiac history: Into the hospital for observation.  Clinical Course   ____________________________________________   FINAL CLINICAL IMPRESSION(S) / ED DIAGNOSES  Final diagnoses:  Near syncope  Bigeminy      NEW MEDICATIONS STARTED DURING THIS VISIT:  New Prescriptions   No medications on file     Note:  This document was prepared using Dragon voice recognition software and may include unintentional dictation errors.    Jene Every, MD 02/01/16 2039

## 2016-02-02 ENCOUNTER — Encounter: Payer: Self-pay | Admitting: Internal Medicine

## 2016-02-02 ENCOUNTER — Encounter
Admission: EM | Disposition: A | Discharge status: Home / Self Care | Payer: Self-pay | Source: Home / Self Care | Attending: Emergency Medicine

## 2016-02-02 DIAGNOSIS — R55 Syncope and collapse: Secondary | ICD-10-CM | POA: Diagnosis not present

## 2016-02-02 DIAGNOSIS — E876 Hypokalemia: Secondary | ICD-10-CM | POA: Diagnosis not present

## 2016-02-02 DIAGNOSIS — N183 Chronic kidney disease, stage 3 (moderate): Secondary | ICD-10-CM | POA: Diagnosis not present

## 2016-02-02 DIAGNOSIS — I5022 Chronic systolic (congestive) heart failure: Secondary | ICD-10-CM | POA: Diagnosis not present

## 2016-02-02 DIAGNOSIS — I479 Paroxysmal tachycardia, unspecified: Secondary | ICD-10-CM | POA: Diagnosis not present

## 2016-02-02 HISTORY — PX: CARDIAC CATHETERIZATION: SHX172

## 2016-02-02 LAB — URINALYSIS COMPLETE WITH MICROSCOPIC (ARMC ONLY)
BACTERIA UA: NONE SEEN
Bilirubin Urine: NEGATIVE
Glucose, UA: NEGATIVE mg/dL
HGB URINE DIPSTICK: NEGATIVE
KETONES UR: NEGATIVE mg/dL
LEUKOCYTES UA: NEGATIVE
NITRITE: NEGATIVE
PH: 5 (ref 5.0–8.0)
PROTEIN: 100 mg/dL — AB
SPECIFIC GRAVITY, URINE: 1.016 (ref 1.005–1.030)

## 2016-02-02 LAB — CBC
HEMATOCRIT: 37.3 % — AB (ref 40.0–52.0)
Hemoglobin: 12.6 g/dL — ABNORMAL LOW (ref 13.0–18.0)
MCH: 29.2 pg (ref 26.0–34.0)
MCHC: 33.9 g/dL (ref 32.0–36.0)
MCV: 86.2 fL (ref 80.0–100.0)
PLATELETS: 159 10*3/uL (ref 150–440)
RBC: 4.32 MIL/uL — ABNORMAL LOW (ref 4.40–5.90)
RDW: 18 % — AB (ref 11.5–14.5)
WBC: 6.6 10*3/uL (ref 3.8–10.6)

## 2016-02-02 LAB — TROPONIN I
Troponin I: 0.03 ng/mL (ref <0.03)
Troponin I: <0.03 ng/mL (ref <0.03)

## 2016-02-02 LAB — BASIC METABOLIC PANEL
Anion gap: 8 (ref 5–15)
BUN: 19 mg/dL (ref 6–20)
CHLORIDE: 104 mmol/L (ref 101–111)
CO2: 28 mmol/L (ref 22–32)
CREATININE: 1.66 mg/dL — AB (ref 0.61–1.24)
Calcium: 8.5 mg/dL — ABNORMAL LOW (ref 8.9–10.3)
GFR calc Af Amer: 46 mL/min — ABNORMAL LOW (ref >60)
GFR calc non Af Amer: 39 mL/min — ABNORMAL LOW (ref >60)
GLUCOSE: 124 mg/dL — AB (ref 65–99)
POTASSIUM: 3.3 mmol/L — AB (ref 3.5–5.1)
SODIUM: 140 mmol/L (ref 135–145)

## 2016-02-02 LAB — MAGNESIUM: Magnesium: 2.1 mg/dL (ref 1.7–2.4)

## 2016-02-02 SURGERY — LEFT HEART CATH
Anesthesia: Moderate Sedation

## 2016-02-02 MED ORDER — IOPAMIDOL (ISOVUE-300) INJECTION 61%
INTRAVENOUS | Status: DC | PRN
  Administered 2016-02-02: 130 mL via INTRA_ARTERIAL

## 2016-02-02 MED ORDER — SODIUM CHLORIDE 0.9 % WEIGHT BASED INFUSION: 1.0000 mL/kg/h | INTRAVENOUS | Status: DC

## 2016-02-02 MED ORDER — SODIUM CHLORIDE 0.9 % IV SOLN: 250.0000 mL | INTRAVENOUS | Status: DC | PRN

## 2016-02-02 MED ORDER — MIDAZOLAM HCL 2 MG/2ML IJ SOLN
INTRAMUSCULAR | Status: DC | PRN
  Administered 2016-02-02: 1 mg via INTRAVENOUS

## 2016-02-02 MED ORDER — FENTANYL CITRATE (PF) 100 MCG/2ML IJ SOLN
INTRAMUSCULAR | Status: DC | PRN
  Administered 2016-02-02: 25 ug via INTRAVENOUS

## 2016-02-02 MED ORDER — HEPARIN (PORCINE) IN NACL 2-0.9 UNIT/ML-% IJ SOLN
INTRAMUSCULAR | Status: AC
  Filled 2016-02-02: qty 1000

## 2016-02-02 MED ORDER — AMIODARONE HCL 200 MG PO TABS
400.0000 mg | ORAL_TABLET | Freq: Two times a day (BID) | ORAL | Status: DC
  Administered 2016-02-02 – 2016-02-05 (×6): 400 mg via ORAL
  Filled 2016-02-02 (×6): qty 2

## 2016-02-02 MED ORDER — AMIODARONE HCL 200 MG PO TABS
200.0000 mg | ORAL_TABLET | Freq: Every day | ORAL | Status: DC
  Administered 2016-02-02: 200 mg via ORAL
  Filled 2016-02-02: qty 1

## 2016-02-02 MED ORDER — ASPIRIN 81 MG PO CHEW: 81.0000 mg | CHEWABLE_TABLET | ORAL | Status: DC

## 2016-02-02 MED ORDER — SODIUM CHLORIDE 0.9 % IV SOLN
INTRAVENOUS | Status: AC
  Administered 2016-02-02: 13:00:00 via INTRAVENOUS

## 2016-02-02 MED ORDER — SODIUM CHLORIDE 0.9% FLUSH
3.0000 mL | Freq: Two times a day (BID) | INTRAVENOUS | Status: DC
  Administered 2016-02-02: 3 mL via INTRAVENOUS

## 2016-02-02 MED ORDER — SODIUM CHLORIDE 0.9 % WEIGHT BASED INFUSION: 3.0000 mL/kg/h | INTRAVENOUS | Status: DC

## 2016-02-02 MED ORDER — SODIUM CHLORIDE 0.9% FLUSH: 3.0000 mL | INTRAVENOUS | Status: DC | PRN

## 2016-02-02 MED ORDER — SODIUM CHLORIDE 0.9% FLUSH
3.0000 mL | Freq: Two times a day (BID) | INTRAVENOUS | Status: DC
  Administered 2016-02-04: 3 mL via INTRAVENOUS

## 2016-02-02 MED ORDER — MIDAZOLAM HCL 2 MG/2ML IJ SOLN
INTRAMUSCULAR | Status: AC
  Filled 2016-02-02: qty 2

## 2016-02-02 MED ORDER — FENTANYL CITRATE (PF) 100 MCG/2ML IJ SOLN
INTRAMUSCULAR | Status: AC
  Filled 2016-02-02: qty 2

## 2016-02-02 SURGICAL SUPPLY — 9 items
CATH INFINITI 5FR ANG PIGTAIL (CATHETERS) ×3 IMPLANT
CATH INFINITI 5FR JL4 (CATHETERS) ×3 IMPLANT
CATH INFINITI JR4 5F (CATHETERS) ×3 IMPLANT
DEVICE CLOSURE MYNXGRIP 5F (Vascular Products) ×3 IMPLANT
KIT MANI 3VAL PERCEP (MISCELLANEOUS) ×3 IMPLANT
NEEDLE PERC 18GX7CM (NEEDLE) ×3 IMPLANT
PACK CARDIAC CATH (CUSTOM PROCEDURE TRAY) ×3 IMPLANT
SHEATH PINNACLE 5F 10CM (SHEATH) ×3 IMPLANT
WIRE EMERALD 3MM-J .035X150CM (WIRE) ×3 IMPLANT

## 2016-02-02 NOTE — Progress Notes (Signed)
Spoke with Dr. Gwen Pounds, new orders received.

## 2016-02-02 NOTE — Consult Note (Signed)
[facility] Cardiology Consultation Note  Patient ID: Larry Lawrence, MRN: [id number], DOB/AGE: January 14, 1941 75 y.o. Admit date: 02/01/2016   Date of Consult: 02/02/2016 Primary Physician: No PCP Per Patient Primary Cardiologist: Gwen Pounds  Chief Complaint:  Chief Complaint  Patient presents with  . Dizziness   Reason for Consult: dizziness  HPI: 75 y.o. male with known coronary artery disease and moderate global LV systolic dysfunction with previous peripheral vascular disease PCI and stent placement of left anterior descending artery in the past who is been on appropriate medication management and doing fairly well until recently having some significant chest shortness of breath with physical activity as well as some palpitations. These palpitations have significantly increased in frequency and or intensity. He has also had episodes where he is dizzy and presyncopal episodes. He is not fully passed out. When the patient was seen in the emergency room he had a significant ventricular bigeminy and short run of wide-complex tachycardia consistent with ventricular tachycardia consistent with unstable angina. Currently the patient is feeling well with no further evidence of significant symptoms  Past Medical History:  Diagnosis Date  . Allergy   . CHF (congestive heart failure) (HCC)   . Coronary artery disease   . Gout   . Insomnia   . Renal disorder   . Sleeps in sitting position due to orthopnea   . TIA (transient ischemic attack)       Surgical History:  Past Surgical History:  Procedure Laterality Date  . CARDIAC SURGERY     3 stents in "main artery"  . JOINT REPLACEMENT       Home Meds: Prior to Admission medications   Medication Sig Start Date End Date Taking? Authorizing Provider  allopurinol (ZYLOPRIM) 300 MG tablet Take 1 tablet (300 mg total) by mouth 2 (two) times daily. 01/04/16  Yes Duanne Limerick, MD  aspirin 81 MG EC tablet Take 81 mg by mouth daily.   Yes Historical  Provider, MD  carvedilol (COREG) 12.5 MG tablet Take 1 tablet (12.5 mg total) by mouth 2 (two) times daily with a meal. 01/04/16  Yes Duanne Limerick, MD  furosemide (LASIX) 40 MG tablet Take 80 mg by mouth daily. Take 2 tabs in the morning   Yes Historical Provider, MD  hydrALAZINE (APRESOLINE) 25 MG tablet Take 1 tablet (25 mg total) by mouth 3 (three) times daily. 01/04/16  Yes Duanne Limerick, MD  isosorbide mononitrate (IMDUR) 30 MG 24 hr tablet Take 30 mg by mouth daily.   Yes Historical Provider, MD  losartan (COZAAR) 50 MG tablet Take 1 tablet (50 mg total) by mouth 2 (two) times daily. 01/04/16  Yes Duanne Limerick, MD  metolazone (ZAROXOLYN) 2.5 MG tablet Take 2.5 mg by mouth every Monday, Wednesday, and Friday. Take 5mg  (2 tabs) 4 days a week   Yes Historical Provider, MD  potassium chloride SA (K-DUR,KLOR-CON) 20 MEQ tablet Take 1 tablet (20 mEq total) by mouth 2 (two) times daily. Patient taking differently: Take 20 mEq by mouth 3 (three) times daily.  01/04/16  Yes Duanne Limerick, MD  timolol (TIMOPTIC) 0.5 % ophthalmic solution Place 1 drop into both eyes 2 (two) times daily.    Yes Historical Provider, MD  traZODone (DESYREL) 50 MG tablet Take 1 tablet (50 mg total) by mouth at bedtime. 01/04/16  Yes Duanne Limerick, MD  loratadine (CLARITIN) 10 MG tablet Take 1 tablet (10 mg total) by mouth daily. 01/04/16   Deanna C  Yetta Barre, MD    Inpatient Medications:  . allopurinol  300 mg Oral BID  . aspirin EC  81 mg Oral Daily  . carvedilol  12.5 mg Oral BID WC  . furosemide  40 mg Oral Daily  . heparin  5,000 Units Subcutaneous Q8H  . isosorbide mononitrate  30 mg Oral Daily  . loratadine  10 mg Oral Daily  . losartan  50 mg Oral BID  . potassium chloride SA  20 mEq Oral BID  . sodium chloride flush  3 mL Intravenous Q12H  . timolol  1 drop Both Eyes BID  . traZODone  50 mg Oral QHS      Allergies:  Allergies  Allergen Reactions  . Niaspan [Niacin Er] Other (See Comments)    Reaction:  Hot  flashes     Social History   Social History  . Marital status: Married    Spouse name: N/A  . Number of children: N/A  . Years of education: N/A   Occupational History  . Not on file.   Social History Main Topics  . Smoking status: Former Games developer  . Smokeless tobacco: Never Used  . Alcohol use No  . Drug use: No  . Sexual activity: Not Currently   Other Topics Concern  . Not on file   Social History Narrative  . No narrative on file     Family History  Problem Relation Age of Onset  . Hypertension Mother   . Pancreatic cancer Mother   . Hypertension Father   . Prostate cancer Father      Review of Systems Positive for Presyncope Negative for: General:  chills, fever, night sweats or weight changes.  Cardiovascular: PND orthopnea positive for syncope dizziness  Dermatological skin lesions rashes Respiratory: Cough congestion Urologic: Frequent urination urination at night and hematuria Abdominal: negative for nausea, vomiting, diarrhea, bright red blood per rectum, melena, or hematemesis Neurologic: negative for visual changes, and/or hearing changes  All other systems reviewed and are otherwise negative except as noted above.  Labs:  Recent Labs  02/01/16 1813 02/01/16 2154 02/02/16 0339  TROPONINI 0.03* 0.03* 0.03*   Lab Results  Component Value Date   WBC 6.6 02/02/2016   HGB 12.6 (L) 02/02/2016   HCT 37.3 (L) 02/02/2016   MCV 86.2 02/02/2016   PLT 159 02/02/2016    Recent Labs Lab 02/02/16 0339  NA 140  K 3.3*  CL 104  CO2 28  BUN 19  CREATININE 1.66*  CALCIUM 8.5*  GLUCOSE 124*   No results found for: CHOL, HDL, LDLCALC, TRIG No results found for: DDIMER  Radiology/Studies:  Dg Chest Portable 1 View  Result Date: 02/01/2016 CLINICAL DATA:  75 year old male with malaise and dizziness since 1600 hours. Initial encounter. EXAM: PORTABLE CHEST 1 VIEW COMPARISON:  08/09/2015 and earlier. FINDINGS: Portable AP upright view at 1937 hours.  Improved lung volumes and basilar ventilation. Stable cardiomegaly and mediastinal contours. Calcified aortic atherosclerosis. Visualized tracheal air column is within normal limits. No pneumothorax, pulmonary edema or acute pulmonary opacity. IMPRESSION: No acute cardiopulmonary abnormality. Mild cardiomegaly.  Calcified aortic atherosclerosis. Electronically Signed   By: Odessa Fleming M.D.   On: 02/01/2016 19:54    EKG: Normal sinus rhythm with the preventricular contraction  Weights: Filed Weights   02/01/16 1805 02/01/16 2228 02/02/16 0502  Weight: 232 lb (105.2 kg) 221 lb 1.6 oz (100.3 kg) 221 lb 1.6 oz (100.3 kg)     Physical Exam: Blood pressure (!) 150/82, pulse  63, temperature 98.1 F (36.7 C), temperature source Oral, resp. rate 17, height  (1.803 m), weight 221 lb 1.6 oz (100.3 kg), SpO2 98 %. Body mass index is 30.84 kg/m. General: Well developed, well nourished, in no acute distress. Head eyes ears nose throat: Normocephalic, atraumatic, sclera non-icteric, no xanthomas, nares are without discharge. No apparent thyromegaly and/or mass  Lungs: Normal respiratory effort.  no wheezes, no rales, no rhonchi.  Heart: RRR with normal S1 S2. no murmur gallop, no rub, PMI is normal size and placement, carotid upstroke normal without bruit, jugular venous pressure is normal Abdomen: Soft, non-tender, non-distended with normoactive bowel sounds. No hepatomegaly. No rebound/guarding. No obvious abdominal masses. Abdominal aorta is normal size without bruit Extremities: No edema. no cyanosis, no clubbing, no ulcers  Peripheral : 2+ bilateral upper extremity pulses, 2+ bilateral femoral pulses, 2+ bilateral dorsal pedal pulse Neuro: Alert and oriented. No facial asymmetry. No focal deficit. Moves all extremities spontaneously. Musculoskeletal: Normal muscle tone without kyphosis Psych:  Responds to questions appropriately with a normal affect.    Assessment: 75 year old male with known  coronary disease status post previous PCI and stent placement of left anterior descending artery with global LV systolic dysfunction and new onset presyncope and dizziness with ventricular tachycardia  Plan: 1. Continue beta blocker for heart rate control and further risk reduction in cardiovascular event 2. Proceed to cardiac catheterization to assess coronary anatomy and further treatment thereof is necessary with likely unstable angina causing ventricular tachycardia. The patient understands risk and benefits of cardiac catheterization. This includes a possibility of death stroke heart attack infection bleeding or blood clot. Patient is at low risk for conscious sedation  Signed, Lamar Blinks M.D. [facility] Cardiology 02/02/2016, 8:27 AM

## 2016-02-02 NOTE — Progress Notes (Signed)
Returned from cath lab s/p lt heart cath.  Dressing to rt groin dry and intact, pulses equal bil.  No distress on ra.  Cardiac monitor in place, denies pain.  CB in reach, SR up x 2.

## 2016-02-02 NOTE — Progress Notes (Signed)
To Specials via bed 

## 2016-02-02 NOTE — Care Management (Addendum)
Patient placed in observation due to syncope.  History of cardiac disease and EF 30%. Did have some nonsustained runs of Vtach in the ED.   Cardiac cath results will be managed medically.  Prior to this presentation, Independent in all adls, denies issues accessing medical care, obtaining medications or with transportation.  Current with PCP.  No discharge needs identified at present by care manager or members of care team.

## 2016-02-02 NOTE — Progress Notes (Signed)
[facility] Cardiology [facility] Encounter Note  Patient: Larry Lawrence / Admit Date: 02/01/2016 / Date of Encounter: 02/02/2016, 1:54 PM   Subjective: Shortness of breath. Still some dizziness. No evidence of chest pain. Troponin normal without evidence of myocardial infarction  Review of Systems: Positive for: Shortness of breath Negative for: Vision change, hearing change, syncope, dizziness, nausea, vomiting,diarrhea, bloody stool, stomach pain, cough, congestion, diaphoresis, urinary frequency, urinary pain,skin lesions, skin rashes Others previously listed  Objective: Telemetry: Normal sinus rhythm with frequent preventricular contractions and short runs of wide-complex tachycardia Physical Exam: Blood pressure (!) 157/77, pulse (!) 58, temperature 97.5 F (36.4 C), temperature source Oral, resp. rate 17, height 5\' 11"  (1.803 m), weight 221 lb 1.6 oz (100.3 kg), SpO2 97 %. Body mass index is 30.84 kg/m. General: Well developed, well nourished, in no acute distress. Head: Normocephalic, atraumatic, sclera non-icteric, no xanthomas, nares are without discharge. Neck: No apparent masses Lungs: Normal respirations with no wheezes, no rhonchi, no rales , no crackles   Heart: Regular rate and rhythm, normal S1 S2, no murmur, no rub, no gallop, PMI is normal size and placement, carotid upstroke normal without bruit, jugular venous pressure normal Abdomen: Soft, non-tender, non-distended with normoactive bowel sounds. No hepatosplenomegaly. Abdominal aorta is normal size without bruit Extremities: No edema, no clubbing, no cyanosis, no ulcers,  Peripheral: 2+ radial, 2+ femoral, 2+ dorsal pedal pulses Neuro: Alert and oriented. Moves all extremities spontaneously. Psych:  Responds to questions appropriately with a normal affect.   Intake/Output Summary (Last 24 hours) at 02/02/16 1354 Last data filed at 02/02/16 1335  Gross per 24 hour  Intake                6 ml  Output               650 ml  Net             -644 ml    Inpatient Medications:  . allopurinol  300 mg Oral BID  . amiodarone  200 mg Oral Daily  . aspirin EC  81 mg Oral Daily  . carvedilol  12.5 mg Oral BID WC  . furosemide  40 mg Oral Daily  . heparin  5,000 Units Subcutaneous Q8H  . isosorbide mononitrate  30 mg Oral Daily  . loratadine  10 mg Oral Daily  . losartan  50 mg Oral BID  . potassium chloride SA  20 mEq Oral BID  . sodium chloride flush  3 mL Intravenous Q12H  . sodium chloride flush  3 mL Intravenous Q12H  . timolol  1 drop Both Eyes BID  . traZODone  50 mg Oral QHS   Infusions:  . sodium chloride 100 mL/hr at 02/02/16 1242    Labs:  Recent Labs  02/01/16 1813 02/02/16 0339 02/02/16 1305  NA 140 140  --   K 3.1* 3.3*  --   CL 101 104  --   CO2 27 28  --   GLUCOSE 144* 124*  --   BUN 18 19  --   CREATININE 1.78* 1.66*  --   CALCIUM 8.7* 8.5*  --   MG  --   --  2.1   No results for input(s): AST, ALT, ALKPHOS, BILITOT, PROT, ALBUMIN in the last 72 hours.  Recent Labs  02/01/16 1813 02/02/16 0339  WBC 9.1 6.6  HGB 14.0 12.6*  HCT 42.2 37.3*  MCV 87.1 86.2  PLT 185 159    Recent Labs  02/01/16 1813 02/01/16 2154 02/02/16 0339 02/02/16 1305  TROPONINI 0.03* 0.03* 0.03* <0.03   Invalid input(s): POCBNP No results for input(s): HGBA1C in the last 72 hours.   Weights: Filed Weights   02/01/16 1805 02/01/16 2228 02/02/16 0502  Weight: 232 lb (105.2 kg) 221 lb 1.6 oz (100.3 kg) 221 lb 1.6 oz (100.3 kg)     Radiology/Studies:  Dg Chest Portable 1 View  Result Date: 02/01/2016 CLINICAL DATA:  75 year old male with malaise and dizziness since 1600 hours. Initial encounter. EXAM: PORTABLE CHEST 1 VIEW COMPARISON:  08/09/2015 and earlier. FINDINGS: Portable AP upright view at 1937 hours. Improved lung volumes and basilar ventilation. Stable cardiomegaly and mediastinal contours. Calcified aortic atherosclerosis. Visualized tracheal air column is within normal  limits. No pneumothorax, pulmonary edema or acute pulmonary opacity. IMPRESSION: No acute cardiopulmonary abnormality. Mild cardiomegaly.  Calcified aortic atherosclerosis. Electronically Signed   By: Odessa Fleming M.D.   On: 02/01/2016 19:54     Assessment and Recommendation  75 y.o. male with known severe LV systolic dysfunction and congestive heart failure slowly improving with appropriate medication management including beta blocker angiotensin receptor blocker isosorbide and hydralazine. The patient has also had no evidence of new onset of exacerbation of congestive heart failure but has had some dizziness weakness and presyncope. This presyncope is most consistent with preventricular contractions ventricular bigeminy and wide complex tachycardia Cardiac catheterization shows continued to global LV systolic dysfunction with ejection fraction of 30% and patent LAD stent and no new significant stenoses 1. Amiodarone for supraventricular tachycardia A. fib and/or wide complex tachycardia 2. Begin ambulation following for suppression of above 3. Continue watch closely for bradycardia and other rhythm disturbances requiring additional treatment 4. Continue aggressive medication management for cardiomyopathy including furosemide carvedilol losartan hydralazine and isosorbide 5. No further cardiac intervention at this time from the coronary artery standpoint 6. Further consultation for defibrillator placement after stabilization of above  Signed, Arnoldo Hooker M.D. FACC

## 2016-02-02 NOTE — Progress Notes (Signed)
[facility] - [place] at [facility]                                                                                                                                                                                            Patient Demographics   Larry Lawrence, is a 75 y.o. male, DOB - January 05, 1941, INO:[id number]  Admit date - 02/01/2016   Admitting Physician Oralia Manis, MD  Outpatient Primary MD for the patient is No PCP Per Patient   LOS - 0  Subjective: Patient admitted with syncope, noted to have brief episode of ventricular tachycardia, frequent PVC    Review of Systems:   CONSTITUTIONAL: No documented fever. No fatigue, weakness. No weight gain, no weight loss.  EYES: No blurry or double vision.  ENT: No tinnitus. No postnasal drip. No redness of the oropharynx.  RESPIRATORY: No cough, no wheeze, no hemoptysis. No dyspnea.  CARDIOVASCULAR: No chest pain. No orthopnea. + palpitations. No syncope.  GASTROINTESTINAL: No nausea, no vomiting or diarrhea. No abdominal pain. No melena or hematochezia.  GENITOURINARY: No dysuria or hematuria.  ENDOCRINE: No polyuria or nocturia. No heat or cold intolerance.  HEMATOLOGY: No anemia. No bruising. No bleeding.  INTEGUMENTARY: No rashes. No lesions.  MUSCULOSKELETAL: No arthritis. No swelling. No gout.  NEUROLOGIC: No numbness, tingling, or ataxia. No seizure-type activity.  PSYCHIATRIC: No anxiety. No insomnia. No ADD.    Vitals:   Vitals:   02/02/16 1130 02/02/16 1145 02/02/16 1200 02/02/16 1224  BP: (!) 144/77 (!) 143/77 (!) 145/80 (!) 157/77  Pulse: (!) 58 (!) 57 63 (!) 58  Resp: 17 15 17 17   Temp:    97.5 F (36.4 C)  TempSrc:    Oral  SpO2: 99% 99% 99% 97%  Weight:      Height:        Wt Readings from Last 3 Encounters:  02/02/16 100.3 kg (221 lb 1.6 oz)  01/29/16 104.8 kg (231 lb)  12/29/15 104.8 kg (231 lb)     Intake/Output Summary (Last 24 hours) at 02/02/16 1414 Last data filed at  02/02/16 1335  Gross per 24 hour  Intake                6 ml  Output              650 ml  Net             -644 ml    Physical Exam:   GENERAL: Pleasant-appearing in no apparent distress.  HEAD, EYES, EARS, NOSE AND THROAT: Atraumatic, normocephalic. Extraocular muscles are intact. Pupils equal and reactive to  light. Sclerae anicteric. No conjunctival injection. No oro-pharyngeal erythema.  NECK: Supple. There is no jugular venous distention. No bruits, no lymphadenopathy, no thyromegaly.  HEART: Regular rate and rhythm,. No murmurs, no rubs, no clicks.  LUNGS: Clear to auscultation bilaterally. No rales or rhonchi. No wheezes.  ABDOMEN: Soft, flat, nontender, nondistended. Has good bowel sounds. No hepatosplenomegaly appreciated.  EXTREMITIES: No evidence of any cyanosis, clubbing, or peripheral edema.  +2 pedal and radial pulses bilaterally.  NEUROLOGIC: The patient is alert, awake, and oriented x3 with no focal motor or sensory deficits appreciated bilaterally.  SKIN: Moist and warm with no rashes appreciated.  Psych: Not anxious, depressed LN: No inguinal LN enlargement    Antibiotics   Anti-infectives    None      Medications   Scheduled Meds: . allopurinol  300 mg Oral BID  . amiodarone  200 mg Oral Daily  . aspirin EC  81 mg Oral Daily  . carvedilol  12.5 mg Oral BID WC  . furosemide  40 mg Oral Daily  . heparin  5,000 Units Subcutaneous Q8H  . isosorbide mononitrate  30 mg Oral Daily  . loratadine  10 mg Oral Daily  . losartan  50 mg Oral BID  . potassium chloride SA  20 mEq Oral BID  . sodium chloride flush  3 mL Intravenous Q12H  . sodium chloride flush  3 mL Intravenous Q12H  . timolol  1 drop Both Eyes BID  . traZODone  50 mg Oral QHS   Continuous Infusions: . sodium chloride 100 mL/hr at 02/02/16 1242   PRN Meds:.sodium chloride, acetaminophen, labetalol, ondansetron **OR** ondansetron (ZOFRAN) IV, sodium chloride flush   Data Review:   Micro  Results No results found for this or any previous visit (from the past 240 hour(s)).  Radiology Reports Dg Chest Portable 1 View  Result Date: 02/01/2016 CLINICAL DATA:  75 year old male with malaise and dizziness since 1600 hours. Initial encounter. EXAM: PORTABLE CHEST 1 VIEW COMPARISON:  08/09/2015 and earlier. FINDINGS: Portable AP upright view at 1937 hours. Improved lung volumes and basilar ventilation. Stable cardiomegaly and mediastinal contours. Calcified aortic atherosclerosis. Visualized tracheal air column is within normal limits. No pneumothorax, pulmonary edema or acute pulmonary opacity. IMPRESSION: No acute cardiopulmonary abnormality. Mild cardiomegaly.  Calcified aortic atherosclerosis. Electronically Signed   By: Odessa Fleming M.D.   On: 02/01/2016 19:54     CBC  Recent Labs Lab 02/01/16 1813 02/02/16 0339  WBC 9.1 6.6  HGB 14.0 12.6*  HCT 42.2 37.3*  PLT 185 159  MCV 87.1 86.2  MCH 28.9 29.2  MCHC 33.2 33.9  RDW 18.5* 18.0*    Chemistries   Recent Labs Lab 02/01/16 1813 02/02/16 0339 02/02/16 1305  NA 140 140  --   K 3.1* 3.3*  --   CL 101 104  --   CO2 27 28  --   GLUCOSE 144* 124*  --   BUN 18 19  --   CREATININE 1.78* 1.66*  --   CALCIUM 8.7* 8.5*  --   MG  --   --  2.1   ------------------------------------------------------------------------------------------------------------------ estimated creatinine clearance is 47.8 mL/min (by C-G formula based on SCr of 1.66 mg/dL). ------------------------------------------------------------------------------------------------------------------ No results for input(s): HGBA1C in the last 72 hours. ------------------------------------------------------------------------------------------------------------------ No results for input(s): CHOL, HDL, LDLCALC, TRIG, CHOLHDL, LDLDIRECT in the last 72  hours. ------------------------------------------------------------------------------------------------------------------ No results for input(s): TSH, T4TOTAL, T3FREE, THYROIDAB in the last 72 hours.  Invalid input(s): FREET3 ------------------------------------------------------------------------------------------------------------------ No results  for input(s): VITAMINB12, FOLATE, FERRITIN, TIBC, IRON, RETICCTPCT in the last 72 hours.  Coagulation profile No results for input(s): INR, PROTIME in the last 168 hours.  No results for input(s): DDIMER in the last 72 hours.  Cardiac Enzymes  Recent Labs Lab 02/01/16 2154 02/02/16 0339 02/02/16 1305  TROPONINI 0.03* 0.03* <0.03   ------------------------------------------------------------------------------------------------------------------ Invalid input(s): POCBNP    Assessment & Plan  75 y.o. male with known severe LV systolic dysfunction and congestive heart failure slowly improving with appropriate medication management including beta blocker angiotensin receptor blocker isosorbide and hydralazine. The patient has also had no evidence of new onset of exacerbation of congestive heart failure but has had some dizziness weakness and presyncope. This presyncope is most consistent with preventricular contractions ventricular bigeminy and wide complex tachycardia Cardiac catheterization shows continued to global LV systolic dysfunction with ejection fraction of 30% and patent LAD stent and no new significant stenoses 1. Syncope with collapse could be related to narrow arrhythmia appreciate cardiology input Started on amiodarone, continue monitor on telemetry appreciate cardiology input 2. Chronic systolic CHF currently compensated Continue Lasix, Imdur and Coreg 3. Bradycardia heart rate stable continue monitor 4. Hypertension continue Coreg, Isorbid mononitrate losartan 5. Out continue allopurinol 6. Miscellaneousheparin for DVT  prophylaxis      Code Status Orders        Start     Ordered   02/01/16 2150  Do not attempt resuscitation (DNR)  Continuous    Question Answer Comment  In the event of cardiac or respiratory ARREST Do not call a "code blue"   In the event of cardiac or respiratory ARREST Do not perform Intubation, CPR, defibrillation or ACLS   In the event of cardiac or respiratory ARREST Use medication by any route, position, wound care, and other measures to relive pain and suffering. May use oxygen, suction and manual treatment of airway obstruction as needed for comfort.      02/01/16 2149    Code Status History    Date Active Date Inactive Code Status Order ID Comments User Context   08/09/2015  4:44 PM 08/11/2015  4:54 PM DNR [id number]  Wyatt Haste, MD ED   08/09/2015  4:43 PM 08/09/2015  4:44 PM Full Code [id number]  Wyatt Haste, MD ED   02/20/2015  9:37 AM 02/21/2015  7:16 PM DNR [id number]  Crissie Figures, MD Inpatient    Advance Directive Documentation   Flowsheet Row Most Recent Value  Type of Advance Directive  Out of facility DNR (pink MOST or yellow form)  Pre-existing out of facility DNR order (yellow form or pink MOST form)  Yellow form placed in chart (order not valid for inpatient use)  "MOST" Form in Place?  No data           Consults cardiology  DVT Prophylaxis  heparin  Lab Results  Component Value Date   PLT 159 02/02/2016     Time Spent in minutes    Greater than 50% of time spent in care coordination and counseling patient regarding the condition and plan of care.   Auburn Bilberry M.D on 02/02/2016 at 2:14 PM  Between 7am to 6pm - Pager - [phone]  After 6pm go to [email or web address] - password [email or web address] [facility]  [facility]   Office  [phone]

## 2016-02-02 NOTE — Progress Notes (Signed)
Patient transported to 2A telemetry floor. RN Kenney Houseman in to assess the right groin with this RN. Hand-off complete.

## 2016-02-02 NOTE — Progress Notes (Signed)
Pt had 9 beat run vtach, asymptomatic, Dr. Gwen Pounds paged.

## 2016-02-02 NOTE — Progress Notes (Signed)
Arrival Method: via stretcher with ED NT Mental Orientation: A&O  Telemetry: MX 40-25, had some bigeminy, PVCs, runs of Vtach in the ED Skin: intact, abrasion on right shin, 2+ edema to bilateral lower legs IV: 20g right hand Pain: no pain Tubes: 3L O2 at night & PRN at home. Safety Measures: Safety Fall Prevention Plan has been given, discussed & signed, non skid socks in place, bed alarm activated. 2A Orientation: Patient has been orientated to the room, unit & staff.  Family: Has been informed of plan of care.  Orders have been reviewed & implemented. Will continue to monitor the patient. Call light has been placed within reach.  Eden Lathe, RN

## 2016-02-03 DIAGNOSIS — I472 Ventricular tachycardia: Secondary | ICD-10-CM | POA: Diagnosis not present

## 2016-02-03 DIAGNOSIS — I5022 Chronic systolic (congestive) heart failure: Secondary | ICD-10-CM | POA: Diagnosis not present

## 2016-02-03 DIAGNOSIS — I479 Paroxysmal tachycardia, unspecified: Secondary | ICD-10-CM | POA: Diagnosis not present

## 2016-02-03 DIAGNOSIS — I10 Essential (primary) hypertension: Secondary | ICD-10-CM | POA: Diagnosis not present

## 2016-02-03 DIAGNOSIS — R55 Syncope and collapse: Secondary | ICD-10-CM | POA: Diagnosis not present

## 2016-02-03 LAB — BASIC METABOLIC PANEL
Anion gap: 9 (ref 5–15)
BUN: 20 mg/dL (ref 6–20)
CALCIUM: 8.4 mg/dL — AB (ref 8.9–10.3)
CO2: 25 mmol/L (ref 22–32)
CREATININE: 1.43 mg/dL — AB (ref 0.61–1.24)
Chloride: 105 mmol/L (ref 101–111)
GFR calc Af Amer: 55 mL/min — ABNORMAL LOW (ref >60)
GFR calc non Af Amer: 47 mL/min — ABNORMAL LOW (ref >60)
GLUCOSE: 108 mg/dL — AB (ref 65–99)
Potassium: 3.4 mmol/L — ABNORMAL LOW (ref 3.5–5.1)
Sodium: 139 mmol/L (ref 135–145)

## 2016-02-03 NOTE — Progress Notes (Signed)
[facility] Encounter Note  Patient: ARIHANT PENNINGS / Admit Date: 02/01/2016 / Date of Encounter: 02/03/2016, 6:31 AM   Subjective: Shortness of breath. Still some dizziness. No evidence of chest pain. Troponin normal without evidence of myocardial infarction. Patient had no episodes of palpitations or irregular heartbeat overnight.  Review of Systems: Positive for: Shortness of breath Negative for: Vision change, hearing change, syncope, dizziness, nausea, vomiting,diarrhea, bloody stool, stomach pain, cough, congestion, diaphoresis, urinary frequency, urinary pain,skin lesions, skin rashes Others previously listed  Objective: Telemetry: Normal sinus rhythm with frequent preventricular contractions and short runs of wide-complex tachycardia from yesterday but no overnight Physical Exam: Blood pressure (!) 161/78, pulse 62, temperature 97.9 F (36.6 C), temperature source Oral, resp. rate 16, height  (1.803 m), weight 222 lb (100.7 kg), SpO2 99 %. Body mass index is 30.96 kg/m. General: Well developed, well nourished, in no acute distress. Head: Normocephalic, atraumatic, sclera non-icteric, no xanthomas, nares are without discharge. Neck: No apparent masses Lungs: Normal respirations with no wheezes, no rhonchi, no rales , no crackles   Heart: Regular rate and rhythm, normal S1 S2, no murmur, no rub, no gallop, PMI is normal size and placement, carotid upstroke normal without bruit, jugular venous pressure normal Abdomen: Soft, non-tender, non-distended with normoactive bowel sounds. No hepatosplenomegaly. Abdominal aorta is normal size without bruit Extremities: No edema, no clubbing, no cyanosis, no ulcers,  Peripheral: 2+ radial, 2+ femoral, 2+ dorsal pedal pulses Neuro: Alert and oriented. Moves all extremities spontaneously. Psych:  Responds to questions appropriately with a normal affect.   Intake/Output Summary (Last 24 hours) at 02/03/16 0631 Last  data filed at 02/03/16 0218  Gross per 24 hour  Intake              686 ml  Output              300 ml  Net              386 ml    Inpatient Medications:  . allopurinol  300 mg Oral BID  . amiodarone  400 mg Oral BID  . aspirin EC  81 mg Oral Daily  . carvedilol  12.5 mg Oral BID WC  . furosemide  40 mg Oral Daily  . heparin  5,000 Units Subcutaneous Q8H  . isosorbide mononitrate  30 mg Oral Daily  . losartan  50 mg Oral BID  . potassium chloride SA  20 mEq Oral BID  . sodium chloride flush  3 mL Intravenous Q12H  . sodium chloride flush  3 mL Intravenous Q12H  . timolol  1 drop Both Eyes BID  . traZODone  50 mg Oral QHS   Infusions:     Labs:  Recent Labs  02/01/16 1813 02/02/16 0339 02/02/16 1305  NA 140 140  --   K 3.1* 3.3*  --   CL 101 104  --   CO2 27 28  --   GLUCOSE 144* 124*  --   BUN 18 19  --   CREATININE 1.78* 1.66*  --   CALCIUM 8.7* 8.5*  --   MG  --   --  2.1   No results for input(s): AST, ALT, ALKPHOS, BILITOT, PROT, ALBUMIN in the last 72 hours.  Recent Labs  02/01/16 1813 02/02/16 0339  WBC 9.1 6.6  HGB 14.0 12.6*  HCT 42.2 37.3*  MCV 87.1 86.2  PLT 185 159    Recent Labs  02/01/16 1813 02/01/16 2154  02/02/16 0339 02/02/16 1305  TROPONINI 0.03* 0.03* 0.03* <0.03   Invalid input(s): POCBNP No results for input(s): HGBA1C in the last 72 hours.   Weights: Filed Weights   02/01/16 2228 02/02/16 0502 02/03/16 0621  Weight: 221 lb 1.6 oz (100.3 kg) 221 lb 1.6 oz (100.3 kg) 222 lb (100.7 kg)     Radiology/Studies:  Dg Chest Portable 1 View  Result Date: 02/01/2016 CLINICAL DATA:  75 year old male with malaise and dizziness since 1600 hours. Initial encounter. EXAM: PORTABLE CHEST 1 VIEW COMPARISON:  08/09/2015 and earlier. FINDINGS: Portable AP upright view at 1937 hours. Improved lung volumes and basilar ventilation. Stable cardiomegaly and mediastinal contours. Calcified aortic atherosclerosis. Visualized tracheal air column is  within normal limits. No pneumothorax, pulmonary edema or acute pulmonary opacity. IMPRESSION: No acute cardiopulmonary abnormality. Mild cardiomegaly.  Calcified aortic atherosclerosis. Electronically Signed   By: Odessa Fleming M.D.   On: 02/01/2016 19:54     Assessment and Recommendation  75 y.o. male with known severe LV systolic dysfunction and congestive heart failure slowly improving with appropriate medication management including beta blocker angiotensin receptor blocker isosorbide and hydralazine. The patient has also had no evidence of new onset of exacerbation of congestive heart failure but has had some dizziness weakness and presyncope. This presyncope is most consistent with preventricular contractions ventricular bigeminy and wide complex tachycardia Cardiac catheterization shows continued to global LV systolic dysfunction with ejection fraction of 30% and patent LAD stent and no new significant stenoses 1. Amiodarone for  wide complex tachycardia 2. Begin ambulation following for suppression of above 3. Continue watch closely for bradycardia and other rhythm disturbances requiring additional treatment 4. Continue aggressive medication management for cardiomyopathy including furosemide carvedilol losartan hydralazine and isosorbide 5. No further cardiac intervention at this time from the coronary artery standpoint 6. Further consultation for defibrillator placement after stabilization of above 7. Continue to watch with ambulation today and the consider possibility of discharge tomorrow if doing well  Signed, Arnoldo Hooker M.D. FACC

## 2016-02-03 NOTE — Evaluation (Signed)
Physical Therapy Evaluation Patient Details Name: Larry Lawrence MRN: [id number] DOB: 1940/10/24 Today's Date: 02/03/2016   History of Present Illness  Larry Lawrence is a 75yo male who [facility] on 8[facility] after insidious episode of lightheadedness, visual changes, and blackout. Pt admitted for near syncopal episode. He is being followed closely by cardiology to address observed vtach and bigeminy, and to monitor any potential bradycardia as meds are changed. Pt recently DC from hospice care. At baseline he AMB short household distances with SPC only. He donns O2 at night (3L/min). He reports great progress with mobility since he started with hospice more than 1 year ago.     Clinical Impression  Upon entry, the patient is received semirecumbent in bed, family present. The pt is awake and agreeable to participate. No acute distress noted at this time. VSS during eval with all activity. Functional mobility assessment demonstrates mild weakness, the pt requiring mod-effort to perform bed mobility and transfers, however this is near baseline. Gait trial is purposefully restrained by patient, but he reports that he feels good; AMB distance tolerance is near baseline, but with high level of AD.   Patient presenting with impairment of strength and activity tolerance, limiting ability to perform ADL and prolonged AMB independently and/or safely. Patient will benefit from skilled intervention to address the above impairments and limitations, in order to restore to prior level of function, improve patient safety upon discharge, and to decrease falls risk.       Follow Up Recommendations Home health PT (HHPT pending; MD mentioned potential need for hospice pending progress. )    Equipment Recommendations  Other (comment) (rollator walker)    Recommendations for Other Services       Precautions / Restrictions Precautions Precautions: None Precaution Comments: Cardiology asking to trial AMB when rhythm is  controlled at rest.  Restrictions Weight Bearing Restrictions: No      Mobility  Bed Mobility Overal bed mobility: Modified Independent Bed Mobility: Supine to Sit;Sit to Supine     Supine to sit: Modified independent (Device/Increase time) Sit to supine: Modified independent (Device/Increase time)      Transfers Overall transfer level: Modified independent Equipment used: Rolling walker (2 wheeled) Transfers: Sit to/from Stand Sit to Stand: Modified independent (Device/Increase time);From elevated surface            Ambulation/Gait Ambulation/Gait assistance: Min guard Ambulation Distance (Feet): 120 Feet Assistive device: Rolling walker (2 wheeled)       General Gait Details: Purposefully slow and cautious; appears safe; denies DOE.   Stairs            Wheelchair Mobility    Modified Rankin (Stroke Patients Only)       Balance Overall balance assessment: Modified Independent                                           Pertinent Vitals/Pain Pain Assessment: No/denies pain    Home Living Family/patient expects to be discharged to:: Private residence Living Arrangements: Spouse/significant other Available Help at Discharge: Family Type of Home: Apartment Home Access: Level entry     Home Layout: One level Home Equipment: Cane - single point Additional Comments: Previously had WC and 4WW, but taken away once DC'd from hospice.     Prior Function Level of Independence: Needs assistance      ADL's / Homemaking Assistance Needed: HH  AMB with SPC; needs mild-mod assistance with ADL.         Hand Dominance        Extremity/Trunk Assessment   Upper Extremity Assessment: Generalized weakness;Overall [facility] for tasks assessed           Lower Extremity Assessment: Generalized weakness;Overall WFL for tasks assessed         Communication   Communication: No difficulties  Cognition Arousal/Alertness:  Awake/alert Behavior During Therapy: WFL for tasks assessed/performed Overall Cognitive Status: Within Functional Limits for tasks assessed                      General Comments      Exercises        Assessment/Plan    PT Assessment Patient needs continued PT services  PT Diagnosis Difficulty walking;Generalized weakness   PT Problem List Decreased activity tolerance;Cardiopulmonary status limiting activity  PT Treatment Interventions Gait training;Therapeutic exercise;Functional mobility training;Therapeutic activities;Patient/family education   PT Goals (Current goals can be found in the Care Plan section) Acute Rehab PT Goals Patient Stated Goal: Continue to improve independence with functional mobility.  PT Goal Formulation: With patient Time For Goal Achievement: 02/17/16 Potential to Achieve Goals: Good    Frequency Min 2X/week   Barriers to discharge        Co-evaluation               End of Session Equipment Utilized During Treatment: Gait belt Activity Tolerance: Patient tolerated treatment well;No increased pain Patient left: in bed;with call bell/phone within reach;with bed alarm set;with family/visitor present Nurse Communication: Mobility status;Other (comment)    Functional Limitation: Mobility: Walking and moving around Mobility: Walking and Moving Around Current Status [phone]): At least 40 percent but less than 60 percent impaired, limited or restricted Mobility: Walking and Moving Around Goal Status [phone]): At least 40 percent but less than 60 percent impaired, limited or restricted    Time: 1702-1724 PT Time Calculation (min) (ACUTE ONLY): 22 min   Charges:   PT Evaluation $PT Eval High Complexity: 1 Procedure PT Treatments $Therapeutic Activity: 8-22 mins   PT G Codes:   PT G-Codes **NOT FOR INPATIENT CLASS** Functional Limitation: Mobility: Walking and moving around Mobility: Walking and Moving Around Current Status ([id number]):  At least 40 percent but less than 60 percent impaired, limited or restricted Mobility: Walking and Moving Around Goal Status [phone]): At least 40 percent but less than 60 percent impaired, limited or restricted    5:44 PM, 02/03/16 Rosamaria Lints, PT, DPT Physical Therapist - [place] [phone] (mobile)

## 2016-02-03 NOTE — Progress Notes (Signed)
Patient ID: Larry Lawrence, male   DOB: Oct 14, 1940, 75 y.o.   MRN: [id number]  [employer] PROGRESS NOTE  ELIU BOEGEL EUM:[id number] DOB: 1940/11/01 DOA: 02/01/2016 PCP: No PCP Per Patient  HPI/Subjective: Patient feels weak. No feelings like he was in a pass out since being in the ER.  Objective: Vitals:   02/03/16 0800 02/03/16 1147  BP: (!) 162/80 (!) 164/79  Pulse: 62 65  Resp:  18  Temp: 97.9 F (36.6 C) 97.7 F (36.5 C)    Filed Weights   02/01/16 2228 02/02/16 0502 02/03/16 5400  Weight: 100.3 kg (221 lb 1.6 oz) 100.3 kg (221 lb 1.6 oz) 100.7 kg (222 lb)    ROS: Review of Systems  Constitutional: Negative for chills and fever.  Eyes: Negative for blurred vision.  Respiratory: Negative for cough and shortness of breath.   Cardiovascular: Negative for chest pain.  Gastrointestinal: Negative for abdominal pain, constipation, diarrhea, nausea and vomiting.  Genitourinary: Negative for dysuria.  Musculoskeletal: Negative for joint pain.  Neurological: Negative for dizziness and headaches.   Exam: Physical Exam  Constitutional: He is oriented to person, place, and time.  HENT:  Nose: No mucosal edema.  Mouth/Throat: No oropharyngeal exudate or posterior oropharyngeal edema.  Eyes: Conjunctivae, EOM and lids are normal. Pupils are equal, round, and reactive to light.  Neck: No JVD present. Carotid bruit is not present. No edema present. No thyroid mass and no thyromegaly present.  Cardiovascular: S1 normal and S2 normal.  Exam reveals no gallop.   No murmur heard. Pulses:      Dorsalis pedis pulses are 2+ on the right side, and 2+ on the left side.  Respiratory: No respiratory distress. He has no wheezes. He has no rhonchi. He has no rales.  GI: Soft. Bowel sounds are normal. There is no tenderness.  Musculoskeletal:       Right ankle: He exhibits swelling.       Left ankle: He exhibits swelling.  Lymphadenopathy:    He has no cervical adenopathy.   Neurological: He is alert and oriented to person, place, and time. No cranial nerve deficit.  Skin: Skin is warm. Nails show no clubbing.  Chronic lower extremity skin discoloration  Psychiatric: He has a normal mood and affect.      Data Reviewed: Basic Metabolic Panel:  Recent Labs Lab 02/01/16 1813 02/02/16 0339 02/02/16 1305 02/03/16 0548  NA 140 140  --  139  K 3.1* 3.3*  --  3.4*  CL 101 104  --  105  CO2 27 28  --  25  GLUCOSE 144* 124*  --  108*  BUN 18 19  --  20  CREATININE 1.78* 1.66*  --  1.43*  CALCIUM 8.7* 8.5*  --  8.4*  MG  --   --  2.1  --    CBC:  Recent Labs Lab 02/01/16 1813 02/02/16 0339  WBC 9.1 6.6  HGB 14.0 12.6*  HCT 42.2 37.3*  MCV 87.1 86.2  PLT 185 159   Cardiac Enzymes:  Recent Labs Lab 02/01/16 1813 02/01/16 2154 02/02/16 0339 02/02/16 1305  TROPONINI 0.03* 0.03* 0.03* <0.03   BNP (last 3 results)  Recent Labs  12/29/15 1606  BNP 401.1*      Studies: Dg Chest Portable 1 View  Result Date: 02/01/2016 CLINICAL DATA:  75 year old male with malaise and dizziness since 1600 hours. Initial encounter. EXAM: PORTABLE CHEST 1 VIEW COMPARISON:  08/09/2015 and earlier. FINDINGS: Portable AP upright  view at 1937 hours. Improved lung volumes and basilar ventilation. Stable cardiomegaly and mediastinal contours. Calcified aortic atherosclerosis. Visualized tracheal air column is within normal limits. No pneumothorax, pulmonary edema or acute pulmonary opacity. IMPRESSION: No acute cardiopulmonary abnormality. Mild cardiomegaly.  Calcified aortic atherosclerosis. Electronically Signed   By: Odessa Fleming M.D.   On: 02/01/2016 19:54    Scheduled Meds: . allopurinol  300 mg Oral BID  . amiodarone  400 mg Oral BID  . aspirin EC  81 mg Oral Daily  . carvedilol  12.5 mg Oral BID WC  . furosemide  40 mg Oral Daily  . heparin  5,000 Units Subcutaneous Q8H  . isosorbide mononitrate  30 mg Oral Daily  . losartan  50 mg Oral BID  . potassium  chloride SA  20 mEq Oral BID  . sodium chloride flush  3 mL Intravenous Q12H  . sodium chloride flush  3 mL Intravenous Q12H  . timolol  1 drop Both Eyes BID  . traZODone  50 mg Oral QHS   Assessment/Plan:  1. Nonsustained ventricular tachycardia. Amiodarone started here in the hospital. 2. Near syncope likely secondary to arrhythmia with decreased ejection fraction. 3. 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 No signs of congestive heart failure continue current medications. 4. Essential hypertension continue usual medication 5. Gout on allopurinol 6. Glaucoma unspecified on timolol 7. Weakness will get physical therapy consultation  Code Status:     Code Status Orders        Start     Ordered   02/01/16 2150  Do not attempt resuscitation (DNR)  Continuous    Question Answer Comment  In the event of cardiac or respiratory ARREST Do not call a "code blue"   In the event of cardiac or respiratory ARREST Do not perform Intubation, CPR, defibrillation or ACLS   In the event of cardiac or respiratory ARREST Use medication by any route, position, wound care, and other measures to relive pain and suffering. May use oxygen, suction and manual treatment of airway obstruction as needed for comfort.      02/01/16 2149    Code Status History    Date Active Date Inactive Code Status Order ID Comments User Context   08/09/2015  4:44 PM 08/11/2015  4:54 PM DNR [id number]  Wyatt Haste, MD ED   08/09/2015  4:43 PM 08/09/2015  4:44 PM Full Code [id number]  Wyatt Haste, MD ED   02/20/2015  9:37 AM 02/21/2015  7:16 PM DNR [id number]  Crissie Figures, MD Inpatient    Advance Directive Documentation   Flowsheet Row Most Recent Value  Type of Advance Directive  Out of facility DNR (pink MOST or yellow form)  Pre-existing out of facility DNR order (yellow form or pink MOST form)  Yellow form placed in chart (order not valid for inpatient use)  "MOST" Form in Place?  No data      Family Communication: Wife at the bedside Disposition Plan: Home soon  Consultants:  Cardiology  Time spent: 28 minutes  Alford Highland  [employer]

## 2016-02-04 DIAGNOSIS — I5022 Chronic systolic (congestive) heart failure: Secondary | ICD-10-CM | POA: Diagnosis not present

## 2016-02-04 DIAGNOSIS — I10 Essential (primary) hypertension: Secondary | ICD-10-CM | POA: Diagnosis not present

## 2016-02-04 DIAGNOSIS — R55 Syncope and collapse: Secondary | ICD-10-CM | POA: Diagnosis not present

## 2016-02-04 DIAGNOSIS — I472 Ventricular tachycardia: Secondary | ICD-10-CM | POA: Diagnosis not present

## 2016-02-04 DIAGNOSIS — I479 Paroxysmal tachycardia, unspecified: Secondary | ICD-10-CM | POA: Diagnosis not present

## 2016-02-04 MED ORDER — FUROSEMIDE 40 MG PO TABS
40.0000 mg | ORAL_TABLET | Freq: Two times a day (BID) | ORAL | Status: DC
  Administered 2016-02-04 – 2016-02-05 (×2): 40 mg via ORAL
  Filled 2016-02-04 (×2): qty 1

## 2016-02-04 MED ORDER — HYDRALAZINE HCL 25 MG PO TABS
25.0000 mg | ORAL_TABLET | Freq: Three times a day (TID) | ORAL | Status: DC
  Administered 2016-02-04 – 2016-02-05 (×3): 25 mg via ORAL
  Filled 2016-02-04 (×3): qty 1

## 2016-02-04 NOTE — Progress Notes (Signed)
[facility]  Patient: Larry Lawrence / Admit Date: 02/01/2016 / Date of Encounter: 02/04/2016, 6:48 AM   Subjective: Shortness of breath. Patient now has some overall significant weakness and fatigue with physical activity and is unable to walk down the hall very well No evidence of chest pain. Troponin normal without evidence of myocardial infarction. Patient had no episodes of palpitations or irregular heartbeat overnight.  Review of Systems: Positive for: Shortness of breath fatigue and weakness Negative for: Vision change, hearing change, syncope, dizziness, nausea, vomiting,diarrhea, bloody stool, stomach pain, cough, congestion, diaphoresis, urinary frequency, urinary pain,skin lesions, skin rashes Others previously listed  Objective: Telemetry: Normal sinus rhythm with resolution of episodes of wide complex tachycardia Physical Exam: Blood pressure (!) 163/81, pulse (!) 58, temperature 98 F (36.7 C), temperature source Oral, resp. rate 18, height 5\' 11"  (1.803 m), weight 227 lb 9.6 oz (103.2 kg), SpO2 100 %. Body mass index is 31.74 kg/m. General: Well developed, well nourished, in no acute distress. Head: Normocephalic, atraumatic, sclera non-icteric, no xanthomas, nares are without discharge. Neck: No apparent masses Lungs: Normal respirations with no wheezes, no rhonchi, no rales , no crackles   Heart: Regular rate and rhythm, normal S1 S2, no murmur, no rub, no gallop, PMI is normal size and placement, carotid upstroke normal without bruit, jugular venous pressure normal Abdomen: Soft, non-tender, non-distended with normoactive bowel sounds. No hepatosplenomegaly. Abdominal aorta is normal size without bruit Extremities: No edema, no clubbing, no cyanosis, no ulcers,  Peripheral: 2+ radial, 2+ femoral, 2+ dorsal pedal pulses Neuro: Alert and oriented. Moves all extremities spontaneously. Psych:  Responds to questions appropriately with a  normal affect.   Intake/Output Summary (Last 24 hours) at 02/04/16 0648 Last data filed at 02/04/16 0522  Gross per 24 hour  Intake              480 ml  Output              300 ml  Net              180 ml    Inpatient Medications:  . allopurinol  300 mg Oral BID  . amiodarone  400 mg Oral BID  . aspirin EC  81 mg Oral Daily  . carvedilol  12.5 mg Oral BID WC  . furosemide  40 mg Oral Daily  . heparin  5,000 Units Subcutaneous Q8H  . isosorbide mononitrate  30 mg Oral Daily  . losartan  50 mg Oral BID  . potassium chloride SA  20 mEq Oral BID  . sodium chloride flush  3 mL Intravenous Q12H  . sodium chloride flush  3 mL Intravenous Q12H  . timolol  1 drop Both Eyes BID  . traZODone  50 mg Oral QHS   Infusions:     Labs:  Recent Labs  02/02/16 0339 02/02/16 1305 02/03/16 0548  NA 140  --  139  K 3.3*  --  3.4*  CL 104  --  105  CO2 28  --  25  GLUCOSE 124*  --  108*  BUN 19  --  20  CREATININE 1.66*  --  1.43*  CALCIUM 8.5*  --  8.4*  MG  --  2.1  --    No results for input(s): AST, ALT, ALKPHOS, BILITOT, PROT, ALBUMIN in the last 72 hours.  Recent Labs  02/01/16 1813 02/02/16 0339  WBC 9.1 6.6  HGB 14.0 12.6*  HCT 42.2 37.3*  MCV 87.1 86.2  PLT 185 159    Recent Labs  02/01/16 1813 02/01/16 2154 02/02/16 0339 02/02/16 1305  TROPONINI 0.03* 0.03* 0.03* <0.03   Invalid input(s): POCBNP No results for input(s): HGBA1C in the last 72 hours.   Weights: Filed Weights   02/02/16 0502 02/03/16 0621 02/04/16 0512  Weight: 221 lb 1.6 oz (100.3 kg) 222 lb (100.7 kg) 227 lb 9.6 oz (103.2 kg)     Radiology/Studies:  Dg Chest Portable 1 View  Result Date: 02/01/2016 CLINICAL DATA:  75 year old male with malaise and dizziness since 1600 hours. Initial encounter. EXAM: PORTABLE CHEST 1 VIEW COMPARISON:  08/09/2015 and earlier. FINDINGS: Portable AP upright view at 1937 hours. Improved lung volumes and basilar ventilation. Stable cardiomegaly and  mediastinal contours. Calcified aortic atherosclerosis. Visualized tracheal air column is within normal limits. No pneumothorax, pulmonary edema or acute pulmonary opacity. IMPRESSION: No acute cardiopulmonary abnormality. Mild cardiomegaly.  Calcified aortic atherosclerosis. Electronically Signed   By: Odessa Fleming M.D.   On: 02/01/2016 19:54     Assessment and Recommendation  75 y.o. male with known severe LV systolic dysfunction and congestive heart failure slowly improving with appropriate medication management including beta blocker angiotensin receptor blocker isosorbide and hydralazine. The patient has also had no evidence of new onset of exacerbation of congestive heart failure but has had some dizziness weakness and presyncope. This presyncope is most consistent with preventricular contractions ventricular bigeminy and wide complex tachycardia Cardiac catheterization shows continued to global LV systolic dysfunction with ejection fraction of 30% and patent LAD stent and no new significant stenoses 1. Amiodarone for  wide complex tachycardiaAt 400 mg each day when going home 2. Continue ambulation following for suppression of above and further evaluation of need for rehabilitation 3. Continue watch closely for bradycardia and other rhythm disturbances requiring additional treatment 4. Continue aggressive medication management for cardiomyopathy including furosemide carvedilol losartan hydralazine and isosorbide without change today 5. No further cardiac intervention at this time from the coronary artery standpoint 6. Further consultation for defibrillator placement after stabilization of above as an outpatient 7. Continue to watch with ambulation today and the consider possibility of discharge today if doing well and follow-up next week for adjustments of medication management  Signed, Arnoldo Hooker M.D. FACC

## 2016-02-04 NOTE — Progress Notes (Signed)
Patient ID: Larry Lawrence, male   DOB: 12/10/1940, 75 y.o.   MRN: [id number]  [facility] PROGRESS NOTE  Larry Lawrence FAO:[id number] DOB: [birth year] DOA: 02/01/2016 PCP: No PCP Per Patient  HPI/Subjective: Patient feels weak. Patient and wife scared to go home today. Patient had an episode of freezing where he couldn't move his legs. He felt weak and tired with walking with physical therapy yesterday.  Objective: Vitals:   02/04/16 0800 02/04/16 1136  BP: (!) 167/84 (!) 173/85  Pulse: (!) 58 68  Resp: 16 18  Temp: 97.7 F (36.5 C) 97.6 F (36.4 C)    Filed Weights   02/02/16 0502 02/03/16 0621 02/04/16 0512  Weight: 100.3 kg (221 lb 1.6 oz) 100.7 kg (222 lb) 103.2 kg (227 lb 9.6 oz)    ROS: Review of Systems  Constitutional: Negative for chills and fever.  Eyes: Negative for blurred vision.  Respiratory: Negative for cough and shortness of breath.   Cardiovascular: Negative for chest pain.  Gastrointestinal: Negative for abdominal pain, constipation, diarrhea, nausea and vomiting.  Genitourinary: Negative for dysuria.  Musculoskeletal: Negative for joint pain.  Neurological: Negative for dizziness and headaches.   Exam: Physical Exam  Constitutional: He is oriented to person, place, and time.  HENT:  Nose: No mucosal edema.  Mouth/Throat: No oropharyngeal exudate or posterior oropharyngeal edema.  Eyes: Conjunctivae, EOM and lids are normal. Pupils are equal, round, and reactive to light.  Neck: No JVD present. Carotid bruit is not present. No edema present. No thyroid mass and no thyromegaly present.  Cardiovascular: S1 normal and S2 normal.  Exam reveals no gallop.   Murmur heard.  Systolic murmur is present with a grade of 2/6  Pulses:      Dorsalis pedis pulses are 2+ on the right side, and 2+ on the left side.  Respiratory: No respiratory distress. He has no wheezes. He has no rhonchi. He has no rales.  GI: Soft. Bowel sounds are normal. There is no  tenderness.  Musculoskeletal:       Right ankle: He exhibits swelling.       Left ankle: He exhibits swelling.  Lymphadenopathy:    He has no cervical adenopathy.  Neurological: He is alert and oriented to person, place, and time. No cranial nerve deficit.  Skin: Skin is warm. Nails show no clubbing.  Chronic lower extremity skin discoloration  Psychiatric: He has a normal mood and affect.      Data Reviewed: Basic Metabolic Panel:  Recent Labs Lab 02/01/16 1813 02/02/16 0339 02/02/16 1305 02/03/16 0548  NA 140 140  --  139  K 3.1* 3.3*  --  3.4*  CL 101 104  --  105  CO2 27 28  --  25  GLUCOSE 144* 124*  --  108*  BUN 18 19  --  20  CREATININE 1.78* 1.66*  --  1.43*  CALCIUM 8.7* 8.5*  --  8.4*  MG  --   --  2.1  --    CBC:  Recent Labs Lab 02/01/16 1813 02/02/16 0339  WBC 9.1 6.6  HGB 14.0 12.6*  HCT 42.2 37.3*  MCV 87.1 86.2  PLT 185 159   Cardiac Enzymes:  Recent Labs Lab 02/01/16 1813 02/01/16 2154 02/02/16 0339 02/02/16 1305  TROPONINI 0.03* 0.03* 0.03* <0.03   BNP (last 3 results)  Recent Labs  12/29/15 1606  BNP 401.1*    Scheduled Meds: . allopurinol  300 mg Oral BID  . amiodarone  400 mg Oral BID  . aspirin EC  81 mg Oral Daily  . carvedilol  12.5 mg Oral BID WC  . furosemide  40 mg Oral BID  . heparin  5,000 Units Subcutaneous Q8H  . isosorbide mononitrate  30 mg Oral Daily  . losartan  50 mg Oral BID  . potassium chloride SA  20 mEq Oral BID  . sodium chloride flush  3 mL Intravenous Q12H  . sodium chloride flush  3 mL Intravenous Q12H  . timolol  1 drop Both Eyes BID  . traZODone  50 mg Oral QHS   Assessment/Plan:  1. Nonsustained ventricular tachycardia. Amiodarone 400 mg twice a day while in the hospital and be switched over to 400 mg daily upon discharge. Patient and wife are very concerned about going home. 2. Near syncope likely secondary to arrhythmia with decreased ejection fraction. 3. 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 No signs of congestive heart failure continue current medications. Since he does have increased leg swelling canal give an extra dose of Lasix this afternoon. 4. Essential hypertension continue usual medication 5. Gout on allopurinol 6. Glaucoma unspecified on timolol 7. Weakness. Did well enough with physical therapy that they recommended home with home health.  Code Status:     Code Status Orders        Start     Ordered   02/01/16 2150  Do not attempt resuscitation (DNR)  Continuous    Question Answer Comment  In the event of cardiac or respiratory ARREST Do not call a "code blue"   In the event of cardiac or respiratory ARREST Do not perform Intubation, CPR, defibrillation or ACLS   In the event of cardiac or respiratory ARREST Use medication by any route, position, wound care, and other measures to relive pain and suffering. May use oxygen, suction and manual treatment of airway obstruction as needed for comfort.      02/01/16 2149    Code Status History    Date Active Date Inactive Code Status Order ID Comments User Context   08/09/2015  4:44 PM 08/11/2015  4:54 PM DNR [id number]  Wyatt Hasteavid K Hower, MD ED   08/09/2015  4:43 PM 08/09/2015  4:44 PM Full Code [id number]  Wyatt Hasteavid K Hower, MD ED   02/20/2015  9:37 AM 02/21/2015  7:16 PM DNR [id number]  Crissie FiguresEdavally N Reddy, MD Inpatient    Advance Directive Documentation   Flowsheet Row Most Recent Value  Type of Advance Directive  Out of facility DNR (pink MOST or yellow form)  Pre-existing out of facility DNR order (yellow form or pink MOST form)  Yellow form placed in chart (order not valid for inpatient use)  "MOST" Form in Place?  No data     Family Communication: Wife at the bedside Disposition Plan: Home Tomorrow  Consultants:  Cardiology  Time spent: 24 minutes  Alford HighlandWIETING, Kelise Kuch  [employer]

## 2016-02-05 DIAGNOSIS — R531 Weakness: Secondary | ICD-10-CM | POA: Diagnosis not present

## 2016-02-05 DIAGNOSIS — R269 Unspecified abnormalities of gait and mobility: Secondary | ICD-10-CM | POA: Diagnosis not present

## 2016-02-05 DIAGNOSIS — M109 Gout, unspecified: Secondary | ICD-10-CM | POA: Diagnosis not present

## 2016-02-05 DIAGNOSIS — R55 Syncope and collapse: Secondary | ICD-10-CM | POA: Diagnosis not present

## 2016-02-05 DIAGNOSIS — I5022 Chronic systolic (congestive) heart failure: Secondary | ICD-10-CM | POA: Diagnosis not present

## 2016-02-05 DIAGNOSIS — Z96659 Presence of unspecified artificial knee joint: Secondary | ICD-10-CM | POA: Diagnosis not present

## 2016-02-05 DIAGNOSIS — G4730 Sleep apnea, unspecified: Secondary | ICD-10-CM | POA: Diagnosis not present

## 2016-02-05 DIAGNOSIS — I472 Ventricular tachycardia: Secondary | ICD-10-CM | POA: Diagnosis not present

## 2016-02-05 DIAGNOSIS — L0390 Cellulitis, unspecified: Secondary | ICD-10-CM | POA: Diagnosis not present

## 2016-02-05 LAB — BASIC METABOLIC PANEL
Anion gap: 6 (ref 5–15)
BUN: 19 mg/dL (ref 6–20)
CHLORIDE: 108 mmol/L (ref 101–111)
CO2: 26 mmol/L (ref 22–32)
Calcium: 8.4 mg/dL — ABNORMAL LOW (ref 8.9–10.3)
Creatinine, Ser: 1.44 mg/dL — ABNORMAL HIGH (ref 0.61–1.24)
GFR calc Af Amer: 54 mL/min — ABNORMAL LOW (ref >60)
GFR calc non Af Amer: 47 mL/min — ABNORMAL LOW (ref >60)
GLUCOSE: 111 mg/dL — AB (ref 65–99)
POTASSIUM: 3.1 mmol/L — AB (ref 3.5–5.1)
SODIUM: 140 mmol/L (ref 135–145)

## 2016-02-05 LAB — MAGNESIUM: MAGNESIUM: 1.9 mg/dL (ref 1.7–2.4)

## 2016-02-05 MED ORDER — AMIODARONE HCL 200 MG PO TABS: 200.0000 mg | ORAL_TABLET | Freq: Two times a day (BID) | ORAL | 0 refills | Status: DC

## 2016-02-05 MED ORDER — METOLAZONE 2.5 MG PO TABS
2.5000 mg | ORAL_TABLET | ORAL | Status: DC
Start: 2016-02-05 — End: 2017-04-15

## 2016-02-05 MED ORDER — POTASSIUM CHLORIDE CRYS ER 20 MEQ PO TBCR
40.0000 meq | EXTENDED_RELEASE_TABLET | Freq: Once | ORAL | Status: AC
  Administered 2016-02-05: 40 meq via ORAL
  Filled 2016-02-05: qty 2

## 2016-02-05 MED ORDER — FUROSEMIDE 40 MG PO TABS: 40.0000 mg | ORAL_TABLET | Freq: Two times a day (BID) | ORAL | Status: DC

## 2016-02-05 MED ORDER — POTASSIUM CHLORIDE CRYS ER 20 MEQ PO TBCR: 20.0000 meq | EXTENDED_RELEASE_TABLET | Freq: Three times a day (TID) | ORAL | Status: DC

## 2016-02-05 NOTE — Discharge Summary (Signed)
[facility] - [place] [facility]   PATIENT NAME: Larry Lawrence    MR#:  [id number]  DATE OF BIRTH:  December 01, 1940  DATE OF ADMISSION:  02/01/2016 ADMITTING PHYSICIAN: Oralia Manis, MD  DATE OF DISCHARGE: 02/05/2016  1:41 PM  PRIMARY CARE PHYSICIAN: Elizabeth Sauer, MD    ADMISSION DIAGNOSIS:  Bigeminy [I49.9] Near syncope [R55]  DISCHARGE DIAGNOSIS:  Principal Problem:   Syncope Active Problems:   CKD (chronic kidney disease), stage III   Chronic systolic CHF (congestive heart failure) (HCC)   Hypokalemia   Gout   SECONDARY DIAGNOSIS:   Past Medical History:  Diagnosis Date  . Allergy   . CHF (congestive heart failure) (HCC)   . Coronary artery disease   . Gout   . Insomnia   . Renal disorder   . Sleeps in sitting position due to orthopnea   . TIA (transient ischemic attack)     HOSPITAL COURSE:   1. Nonsustained ventricular tachycardia. Amiodarone was loaded during the hospital course 400 mg twice a day. This will be switched over to 200 mg twice a day upon discharge home. Follow-up with Dr. Gwen Pounds as outpatient. Consideration for defibrillator as outpatient. In the last 24 hours the patient had no episodes of nonsustained ventricular tachycardia. Cardiac catheter did show coronary artery disease but no lesions that needed stenting. 2. Near-syncope secondary to arrhythmia with decreased ejection fraction and cardiomyopathy 3. 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 Patient had no signs of congestive heart failure with his usual medications. I did give an extra dose of Lasix secondary to leg swelling. The patient can go back on his Zaroxolyn as outpatient 3 times a week. 4. Essential hypertension continue usual medication. 5. Gout on allopurinol 6. Gout, unspecified on timolol 7. Weakness. Physical therapy recommended home with home health. 8 hypokalemia extra potassium dose today. Follow-up as outpatient. 9. Chronic kidney disease  stage III. Follow-up with diuresis.   DISCHARGE CONDITIONS:   Fair  CONSULTS OBTAINED:  Treatment Team:  Lamar Blinks, MD  DRUG ALLERGIES:   Allergies  Allergen Reactions  . Niaspan [Niacin Er] Other (See Comments)    Reaction:  Hot flashes     DISCHARGE MEDICATIONS:   Discharge Medication List as of 02/05/2016 11:49 AM    CONTINUE these medications which have CHANGED   Details  amiodarone (PACERONE) 200 MG tablet Take 1 tablet (200 mg total) by mouth 2 (two) times daily., Starting Mon 02/05/2016, Print    furosemide (LASIX) 40 MG tablet Take 1 tablet (40 mg total) by mouth 2 (two) times daily. Take 2 tabs in the morning, Starting Mon 02/05/2016, No Print    metolazone (ZAROXOLYN) 2.5 MG tablet Take 1 tablet (2.5 mg total) by mouth every Monday, Wednesday, and Friday., Starting Mon 02/05/2016, No Print    potassium chloride SA (K-DUR,KLOR-CON) 20 MEQ tablet Take 1 tablet (20 mEq total) by mouth 3 (three) times daily., Starting Mon 02/05/2016, No Print      CONTINUE these medications which have NOT CHANGED   Details  allopurinol (ZYLOPRIM) 300 MG tablet Take 1 tablet (300 mg total) by mouth 2 (two) times daily., Starting Thu 01/04/2016, Normal    aspirin 81 MG EC tablet Take 81 mg by mouth daily., Historical Med    carvedilol (COREG) 12.5 MG tablet Take 1 tablet (12.5 mg total) by mouth 2 (two) times daily with a meal., Starting Thu 01/04/2016, Normal    hydrALAZINE (APRESOLINE) 25 MG tablet Take 1 tablet (25  mg total) by mouth 3 (three) times daily., Starting Thu 01/04/2016, Normal    isosorbide mononitrate (IMDUR) 30 MG 24 hr tablet Take 30 mg by mouth daily., Until Discontinued, Historical Med    losartan (COZAAR) 50 MG tablet Take 1 tablet (50 mg total) by mouth 2 (two) times daily., Starting Thu 01/04/2016, Normal    timolol (TIMOPTIC) 0.5 % ophthalmic solution Place 1 drop into both eyes 2 (two) times daily. , Until Discontinued, Historical Med    traZODone (DESYREL) 50 MG  tablet Take 1 tablet (50 mg total) by mouth at bedtime., Starting Thu 01/04/2016, Normal    loratadine (CLARITIN) 10 MG tablet Take 1 tablet (10 mg total) by mouth daily., Starting Thu 01/04/2016, Normal      STOP taking these medications     acetaminophen (TYLENOL) 500 MG tablet          DISCHARGE INSTRUCTIONS:   Follow-up PMD one week Follow up cardiology 1-2 weeks.  If you experience worsening of your admission symptoms, develop shortness of breath, life threatening emergency, suicidal or homicidal thoughts you must seek medical attention immediately by calling 911 or calling your MD immediately  if symptoms less severe.  You Must read complete instructions/literature along with all the possible adverse reactions/side effects for all the Medicines you take and that have been prescribed to you. Take any new Medicines after you have completely understood and accept all the possible adverse reactions/side effects.   Please note  You were cared for by a hospitalist during your hospital stay. If you have any questions about your discharge medications or the care you received while you were in the hospital after you are discharged, you can call the unit and asked to speak with the hospitalist on call if the hospitalist that took care of you is not available. Once you are discharged, your primary care physician will handle any further medical issues. Please note that NO REFILLS for any discharge medications will be authorized once you are discharged, as it is imperative that you return to your primary care physician (or establish a relationship with a primary care physician if you do not have one) for your aftercare needs so that they can reassess your need for medications and monitor your lab values.    Today   CHIEF COMPLAINT:   Chief Complaint  Patient presents with  . Dizziness    HISTORY OF PRESENT ILLNESS:  Larry Lawrence  is a 75 y.o. male presented with near syncope and found to  have non-sustained ventricular tachycardia.   VITAL SIGNS:  Blood pressure (!) 157/75, pulse 61, temperature 97.9 F (36.6 C), temperature source Oral, resp. rate 18, height 5\' 11"  (1.803 m), weight 104.1 kg (229 lb 6.4 oz), SpO2 96 %.    PHYSICAL EXAMINATION:  GENERAL:  75 y.o.-year-old patient lying in the bed with no acute distress.  EYES: Pupils equal, round, reactive to light and accommodation. No scleral icterus. Extraocular muscles intact.  HEENT: Head atraumatic, normocephalic. Oropharynx and nasopharynx clear.  NECK:  Supple, no jugular venous distention. No thyroid enlargement, no tenderness.  LUNGS: Normal breath sounds bilaterally, no wheezing, rales,rhonchi or crepitation. No use of accessory muscles of respiration.  CARDIOVASCULAR: S1, S2 normal. 26 systolic murmurs, no rubs, or gallops.  ABDOMEN: Soft, non-tender, non-distended. Bowel sounds present. No organomegaly or mass.  EXTREMITIES: 2+ pedal edema, no cyanosis, or clubbing.  NEUROLOGIC: Cranial nerves II through XII are intact. Muscle strength 5/5 in all extremities. Sensation intact. Gait not checked.  PSYCHIATRIC: The patient is alert and oriented x 3.  SKIN: No obvious rash, lesion, or ulcer.   DATA REVIEW:   CBC  Recent Labs Lab 02/02/16 0339  WBC 6.6  HGB 12.6*  HCT 37.3*  PLT 159    Chemistries   Recent Labs Lab 02/05/16 0621  NA 140  K 3.1*  CL 108  CO2 26  GLUCOSE 111*  BUN 19  CREATININE 1.44*  CALCIUM 8.4*  MG 1.9    Cardiac Enzymes  Recent Labs Lab 02/02/16 1305  TROPONINI <0.03          Management plans discussed with the patient, family and they are in agreement.  CODE STATUS:     Code Status Orders        Start     Ordered   02/01/16 2150  Do not attempt resuscitation (DNR)  Continuous    Question Answer Comment  In the event of cardiac or respiratory ARREST Do not call a "code blue"   In the event of cardiac or respiratory ARREST Do not perform  Intubation, CPR, defibrillation or ACLS   In the event of cardiac or respiratory ARREST Use medication by any route, position, wound care, and other measures to relive pain and suffering. May use oxygen, suction and manual treatment of airway obstruction as needed for comfort.      02/01/16 2149    Code Status History    Date Active Date Inactive Code Status Order ID Comments User Context   08/09/2015  4:44 PM 08/11/2015  4:54 PM DNR [id number]  Wyatt Haste, MD ED   08/09/2015  4:43 PM 08/09/2015  4:44 PM Full Code [id number]  Wyatt Haste, MD ED   02/20/2015  9:37 AM 02/21/2015  7:16 PM DNR [id number]  Crissie Figures, MD Inpatient    Advance Directive Documentation   Flowsheet Row Most Recent Value  Type of Advance Directive  Out of facility DNR (pink MOST or yellow form)  Pre-existing out of facility DNR order (yellow form or pink MOST form)  Yellow form placed in chart (order not valid for inpatient use)  "MOST" Form in Place?  No data      TOTAL TIME TAKING CARE OF THIS PATIENT: 35 minutes.    Alford Highland M.D on 02/05/2016 at 3:46 PM  Between 7am to 6pm - Pager - [phone]  After 6pm go to [email or web address] - password [email or web address]  [facility] Office  [phone]  CC: Primary care physician; Elizabeth Sauer, MD

## 2016-02-05 NOTE — Progress Notes (Signed)
Pt to be discharged to home today. Rolling walker provided , and home health arranged by care management. Iv's  and tele removed. disch instructions and prescrip  Given to pt and wife to their understanding. disch via w.c. Accompanied by family

## 2016-02-05 NOTE — Care Management (Signed)
Per attending documentation from 8/6, patient will benefit from home health follow up.  Presented list of home health agencies- [facility].  Has had service with this agency.  [facility] just closed patient in June of this year.  Requires Rollator.  Order present and called to [facility].  Wife to transport home

## 2016-02-05 NOTE — Discharge Instructions (Signed)
Near-Syncope Near-syncope (commonly known as near fainting) is sudden weakness, dizziness, or feeling like you might pass out. This can happen when getting up or while standing for a long time. It is caused by a sudden decrease in blood flow to the brain, which can occur for various reasons. Most of the reasons are not serious.  HOME CARE Watch your condition for any changes.  Have someone stay with you until you feel stable.  If you feel like you are going to pass out:  Lie down right away.  Prop your feet up if you can.  Breathe deeply and steadily.  Move only when the feeling has gone away. Most of the time, this feeling lasts only a few minutes. You may feel tired for several hours.  Drink enough fluids to keep your pee (urine) clear or pale yellow.  If you are taking blood pressure or heart medicine, stand up slowly.  Follow up with your doctor as told. GET HELP RIGHT AWAY IF:   You have a severe headache.  You have unusual pain in the chest, belly (abdomen), or back.  You have bleeding from the mouth or butt (rectum), or you have black or tarry poop (stool).  You feel your heart beat differently than normal, or you have a very fast pulse.  You pass out, or you twitch and shake when you pass out.  You pass out when sitting or lying down.  You feel confused.  You have trouble walking.  You are weak.  You have vision problems. MAKE SURE YOU:   Understand these instructions.  Will watch your condition.  Will get help right away if you are not doing well or get worse.   This information is not intended to replace advice given to you by your health care provider. Make sure you discuss any questions you have with your health care provider.   Document Released: 12/04/2007 Document Revised: 07/08/2014 Document Reviewed: 11/20/2012 Elsevier Interactive Patient Education 2016 [employer]  

## 2016-02-07 DIAGNOSIS — I130 Hypertensive heart and chronic kidney disease with heart failure and stage 1 through stage 4 chronic kidney disease, or unspecified chronic kidney disease: Secondary | ICD-10-CM | POA: Diagnosis not present

## 2016-02-07 DIAGNOSIS — I5022 Chronic systolic (congestive) heart failure: Secondary | ICD-10-CM | POA: Diagnosis not present

## 2016-02-07 DIAGNOSIS — E785 Hyperlipidemia, unspecified: Secondary | ICD-10-CM | POA: Diagnosis not present

## 2016-02-07 DIAGNOSIS — Z8673 Personal history of transient ischemic attack (TIA), and cerebral infarction without residual deficits: Secondary | ICD-10-CM | POA: Diagnosis not present

## 2016-02-07 DIAGNOSIS — G4733 Obstructive sleep apnea (adult) (pediatric): Secondary | ICD-10-CM | POA: Diagnosis not present

## 2016-02-07 DIAGNOSIS — I429 Cardiomyopathy, unspecified: Secondary | ICD-10-CM | POA: Diagnosis not present

## 2016-02-07 DIAGNOSIS — I509 Heart failure, unspecified: Secondary | ICD-10-CM | POA: Diagnosis not present

## 2016-02-07 DIAGNOSIS — Z87891 Personal history of nicotine dependence: Secondary | ICD-10-CM | POA: Diagnosis not present

## 2016-02-07 DIAGNOSIS — I2510 Atherosclerotic heart disease of native coronary artery without angina pectoris: Secondary | ICD-10-CM | POA: Diagnosis not present

## 2016-02-07 DIAGNOSIS — Z9981 Dependence on supplemental oxygen: Secondary | ICD-10-CM | POA: Diagnosis not present

## 2016-02-07 DIAGNOSIS — I080 Rheumatic disorders of both mitral and aortic valves: Secondary | ICD-10-CM | POA: Diagnosis not present

## 2016-02-07 DIAGNOSIS — N183 Chronic kidney disease, stage 3 (moderate): Secondary | ICD-10-CM | POA: Diagnosis not present

## 2016-02-13 DIAGNOSIS — I130 Hypertensive heart and chronic kidney disease with heart failure and stage 1 through stage 4 chronic kidney disease, or unspecified chronic kidney disease: Secondary | ICD-10-CM | POA: Diagnosis not present

## 2016-02-13 DIAGNOSIS — G4733 Obstructive sleep apnea (adult) (pediatric): Secondary | ICD-10-CM | POA: Diagnosis not present

## 2016-02-13 DIAGNOSIS — E785 Hyperlipidemia, unspecified: Secondary | ICD-10-CM | POA: Diagnosis not present

## 2016-02-13 DIAGNOSIS — Z9981 Dependence on supplemental oxygen: Secondary | ICD-10-CM | POA: Diagnosis not present

## 2016-02-13 DIAGNOSIS — I5022 Chronic systolic (congestive) heart failure: Secondary | ICD-10-CM | POA: Diagnosis not present

## 2016-02-13 DIAGNOSIS — N183 Chronic kidney disease, stage 3 (moderate): Secondary | ICD-10-CM | POA: Diagnosis not present

## 2016-02-13 DIAGNOSIS — I2510 Atherosclerotic heart disease of native coronary artery without angina pectoris: Secondary | ICD-10-CM | POA: Diagnosis not present

## 2016-02-13 DIAGNOSIS — I429 Cardiomyopathy, unspecified: Secondary | ICD-10-CM | POA: Diagnosis not present

## 2016-02-13 DIAGNOSIS — Z8673 Personal history of transient ischemic attack (TIA), and cerebral infarction without residual deficits: Secondary | ICD-10-CM | POA: Diagnosis not present

## 2016-02-13 DIAGNOSIS — Z87891 Personal history of nicotine dependence: Secondary | ICD-10-CM | POA: Diagnosis not present

## 2016-02-15 DIAGNOSIS — Z87891 Personal history of nicotine dependence: Secondary | ICD-10-CM | POA: Diagnosis not present

## 2016-02-15 DIAGNOSIS — I2510 Atherosclerotic heart disease of native coronary artery without angina pectoris: Secondary | ICD-10-CM | POA: Diagnosis not present

## 2016-02-15 DIAGNOSIS — Z8673 Personal history of transient ischemic attack (TIA), and cerebral infarction without residual deficits: Secondary | ICD-10-CM | POA: Diagnosis not present

## 2016-02-15 DIAGNOSIS — N183 Chronic kidney disease, stage 3 (moderate): Secondary | ICD-10-CM | POA: Diagnosis not present

## 2016-02-15 DIAGNOSIS — I130 Hypertensive heart and chronic kidney disease with heart failure and stage 1 through stage 4 chronic kidney disease, or unspecified chronic kidney disease: Secondary | ICD-10-CM | POA: Diagnosis not present

## 2016-02-15 DIAGNOSIS — E785 Hyperlipidemia, unspecified: Secondary | ICD-10-CM | POA: Diagnosis not present

## 2016-02-15 DIAGNOSIS — Z9981 Dependence on supplemental oxygen: Secondary | ICD-10-CM | POA: Diagnosis not present

## 2016-02-15 DIAGNOSIS — G4733 Obstructive sleep apnea (adult) (pediatric): Secondary | ICD-10-CM | POA: Diagnosis not present

## 2016-02-15 DIAGNOSIS — I429 Cardiomyopathy, unspecified: Secondary | ICD-10-CM | POA: Diagnosis not present

## 2016-02-15 DIAGNOSIS — I5022 Chronic systolic (congestive) heart failure: Secondary | ICD-10-CM | POA: Diagnosis not present

## 2016-02-16 DIAGNOSIS — R0602 Shortness of breath: Secondary | ICD-10-CM | POA: Diagnosis not present

## 2016-02-16 DIAGNOSIS — I5022 Chronic systolic (congestive) heart failure: Secondary | ICD-10-CM | POA: Diagnosis not present

## 2016-02-16 DIAGNOSIS — I472 Ventricular tachycardia: Secondary | ICD-10-CM | POA: Diagnosis not present

## 2016-02-16 DIAGNOSIS — I2510 Atherosclerotic heart disease of native coronary artery without angina pectoris: Secondary | ICD-10-CM | POA: Diagnosis not present

## 2016-02-16 DIAGNOSIS — I10 Essential (primary) hypertension: Secondary | ICD-10-CM | POA: Diagnosis not present

## 2016-02-20 DIAGNOSIS — E785 Hyperlipidemia, unspecified: Secondary | ICD-10-CM | POA: Diagnosis not present

## 2016-02-20 DIAGNOSIS — Z9981 Dependence on supplemental oxygen: Secondary | ICD-10-CM | POA: Diagnosis not present

## 2016-02-20 DIAGNOSIS — Z8673 Personal history of transient ischemic attack (TIA), and cerebral infarction without residual deficits: Secondary | ICD-10-CM | POA: Diagnosis not present

## 2016-02-20 DIAGNOSIS — I5022 Chronic systolic (congestive) heart failure: Secondary | ICD-10-CM | POA: Diagnosis not present

## 2016-02-20 DIAGNOSIS — I429 Cardiomyopathy, unspecified: Secondary | ICD-10-CM | POA: Diagnosis not present

## 2016-02-20 DIAGNOSIS — I130 Hypertensive heart and chronic kidney disease with heart failure and stage 1 through stage 4 chronic kidney disease, or unspecified chronic kidney disease: Secondary | ICD-10-CM | POA: Diagnosis not present

## 2016-02-20 DIAGNOSIS — N183 Chronic kidney disease, stage 3 (moderate): Secondary | ICD-10-CM | POA: Diagnosis not present

## 2016-02-20 DIAGNOSIS — I2510 Atherosclerotic heart disease of native coronary artery without angina pectoris: Secondary | ICD-10-CM | POA: Diagnosis not present

## 2016-02-20 DIAGNOSIS — Z87891 Personal history of nicotine dependence: Secondary | ICD-10-CM | POA: Diagnosis not present

## 2016-02-20 DIAGNOSIS — G4733 Obstructive sleep apnea (adult) (pediatric): Secondary | ICD-10-CM | POA: Diagnosis not present

## 2016-02-23 DIAGNOSIS — I2510 Atherosclerotic heart disease of native coronary artery without angina pectoris: Secondary | ICD-10-CM | POA: Diagnosis not present

## 2016-02-23 DIAGNOSIS — I5022 Chronic systolic (congestive) heart failure: Secondary | ICD-10-CM | POA: Diagnosis not present

## 2016-02-23 DIAGNOSIS — Z9981 Dependence on supplemental oxygen: Secondary | ICD-10-CM | POA: Diagnosis not present

## 2016-02-23 DIAGNOSIS — Z87891 Personal history of nicotine dependence: Secondary | ICD-10-CM | POA: Diagnosis not present

## 2016-02-23 DIAGNOSIS — G4733 Obstructive sleep apnea (adult) (pediatric): Secondary | ICD-10-CM | POA: Diagnosis not present

## 2016-02-23 DIAGNOSIS — Z8673 Personal history of transient ischemic attack (TIA), and cerebral infarction without residual deficits: Secondary | ICD-10-CM | POA: Diagnosis not present

## 2016-02-23 DIAGNOSIS — E785 Hyperlipidemia, unspecified: Secondary | ICD-10-CM | POA: Diagnosis not present

## 2016-02-23 DIAGNOSIS — N183 Chronic kidney disease, stage 3 (moderate): Secondary | ICD-10-CM | POA: Diagnosis not present

## 2016-02-23 DIAGNOSIS — I130 Hypertensive heart and chronic kidney disease with heart failure and stage 1 through stage 4 chronic kidney disease, or unspecified chronic kidney disease: Secondary | ICD-10-CM | POA: Diagnosis not present

## 2016-02-23 DIAGNOSIS — I429 Cardiomyopathy, unspecified: Secondary | ICD-10-CM | POA: Diagnosis not present

## 2016-02-28 DIAGNOSIS — Z8673 Personal history of transient ischemic attack (TIA), and cerebral infarction without residual deficits: Secondary | ICD-10-CM | POA: Diagnosis not present

## 2016-02-28 DIAGNOSIS — N183 Chronic kidney disease, stage 3 (moderate): Secondary | ICD-10-CM | POA: Diagnosis not present

## 2016-02-28 DIAGNOSIS — I130 Hypertensive heart and chronic kidney disease with heart failure and stage 1 through stage 4 chronic kidney disease, or unspecified chronic kidney disease: Secondary | ICD-10-CM | POA: Diagnosis not present

## 2016-02-28 DIAGNOSIS — E785 Hyperlipidemia, unspecified: Secondary | ICD-10-CM | POA: Diagnosis not present

## 2016-02-28 DIAGNOSIS — I2510 Atherosclerotic heart disease of native coronary artery without angina pectoris: Secondary | ICD-10-CM | POA: Diagnosis not present

## 2016-02-28 DIAGNOSIS — Z9981 Dependence on supplemental oxygen: Secondary | ICD-10-CM | POA: Diagnosis not present

## 2016-02-28 DIAGNOSIS — I5022 Chronic systolic (congestive) heart failure: Secondary | ICD-10-CM | POA: Diagnosis not present

## 2016-02-28 DIAGNOSIS — I429 Cardiomyopathy, unspecified: Secondary | ICD-10-CM | POA: Diagnosis not present

## 2016-02-28 DIAGNOSIS — G4733 Obstructive sleep apnea (adult) (pediatric): Secondary | ICD-10-CM | POA: Diagnosis not present

## 2016-02-28 DIAGNOSIS — Z87891 Personal history of nicotine dependence: Secondary | ICD-10-CM | POA: Diagnosis not present

## 2016-02-29 DIAGNOSIS — I509 Heart failure, unspecified: Secondary | ICD-10-CM | POA: Diagnosis not present

## 2016-02-29 DIAGNOSIS — G4734 Idiopathic sleep related nonobstructive alveolar hypoventilation: Secondary | ICD-10-CM | POA: Diagnosis not present

## 2016-02-29 DIAGNOSIS — R29818 Other symptoms and signs involving the nervous system: Secondary | ICD-10-CM | POA: Diagnosis not present

## 2016-03-07 DIAGNOSIS — Z87891 Personal history of nicotine dependence: Secondary | ICD-10-CM | POA: Diagnosis not present

## 2016-03-07 DIAGNOSIS — I5022 Chronic systolic (congestive) heart failure: Secondary | ICD-10-CM | POA: Diagnosis not present

## 2016-03-07 DIAGNOSIS — I2510 Atherosclerotic heart disease of native coronary artery without angina pectoris: Secondary | ICD-10-CM | POA: Diagnosis not present

## 2016-03-07 DIAGNOSIS — N183 Chronic kidney disease, stage 3 (moderate): Secondary | ICD-10-CM | POA: Diagnosis not present

## 2016-03-07 DIAGNOSIS — Z8673 Personal history of transient ischemic attack (TIA), and cerebral infarction without residual deficits: Secondary | ICD-10-CM | POA: Diagnosis not present

## 2016-03-07 DIAGNOSIS — Z9981 Dependence on supplemental oxygen: Secondary | ICD-10-CM | POA: Diagnosis not present

## 2016-03-07 DIAGNOSIS — I429 Cardiomyopathy, unspecified: Secondary | ICD-10-CM | POA: Diagnosis not present

## 2016-03-07 DIAGNOSIS — I130 Hypertensive heart and chronic kidney disease with heart failure and stage 1 through stage 4 chronic kidney disease, or unspecified chronic kidney disease: Secondary | ICD-10-CM | POA: Diagnosis not present

## 2016-03-07 DIAGNOSIS — G4733 Obstructive sleep apnea (adult) (pediatric): Secondary | ICD-10-CM | POA: Diagnosis not present

## 2016-03-07 DIAGNOSIS — E785 Hyperlipidemia, unspecified: Secondary | ICD-10-CM | POA: Diagnosis not present

## 2016-03-19 DIAGNOSIS — I5022 Chronic systolic (congestive) heart failure: Secondary | ICD-10-CM | POA: Diagnosis not present

## 2016-03-19 DIAGNOSIS — R600 Localized edema: Secondary | ICD-10-CM | POA: Diagnosis not present

## 2016-03-19 DIAGNOSIS — G4733 Obstructive sleep apnea (adult) (pediatric): Secondary | ICD-10-CM | POA: Diagnosis not present

## 2016-03-19 DIAGNOSIS — I10 Essential (primary) hypertension: Secondary | ICD-10-CM | POA: Diagnosis not present

## 2016-03-21 ENCOUNTER — Other Ambulatory Visit: Payer: Self-pay

## 2016-03-26 DIAGNOSIS — Z9981 Dependence on supplemental oxygen: Secondary | ICD-10-CM | POA: Diagnosis not present

## 2016-03-26 DIAGNOSIS — N183 Chronic kidney disease, stage 3 (moderate): Secondary | ICD-10-CM | POA: Diagnosis not present

## 2016-03-26 DIAGNOSIS — I5022 Chronic systolic (congestive) heart failure: Secondary | ICD-10-CM | POA: Diagnosis not present

## 2016-03-26 DIAGNOSIS — I429 Cardiomyopathy, unspecified: Secondary | ICD-10-CM | POA: Diagnosis not present

## 2016-03-26 DIAGNOSIS — G4733 Obstructive sleep apnea (adult) (pediatric): Secondary | ICD-10-CM | POA: Diagnosis not present

## 2016-03-26 DIAGNOSIS — Z8673 Personal history of transient ischemic attack (TIA), and cerebral infarction without residual deficits: Secondary | ICD-10-CM | POA: Diagnosis not present

## 2016-03-26 DIAGNOSIS — I2510 Atherosclerotic heart disease of native coronary artery without angina pectoris: Secondary | ICD-10-CM | POA: Diagnosis not present

## 2016-03-26 DIAGNOSIS — E785 Hyperlipidemia, unspecified: Secondary | ICD-10-CM | POA: Diagnosis not present

## 2016-03-26 DIAGNOSIS — I130 Hypertensive heart and chronic kidney disease with heart failure and stage 1 through stage 4 chronic kidney disease, or unspecified chronic kidney disease: Secondary | ICD-10-CM | POA: Diagnosis not present

## 2016-03-26 DIAGNOSIS — Z87891 Personal history of nicotine dependence: Secondary | ICD-10-CM | POA: Diagnosis not present

## 2016-03-30 DIAGNOSIS — I509 Heart failure, unspecified: Secondary | ICD-10-CM | POA: Diagnosis not present

## 2016-03-30 DIAGNOSIS — R29818 Other symptoms and signs involving the nervous system: Secondary | ICD-10-CM | POA: Diagnosis not present

## 2016-03-30 DIAGNOSIS — G4734 Idiopathic sleep related nonobstructive alveolar hypoventilation: Secondary | ICD-10-CM | POA: Diagnosis not present

## 2016-04-30 DIAGNOSIS — G4734 Idiopathic sleep related nonobstructive alveolar hypoventilation: Secondary | ICD-10-CM | POA: Diagnosis not present

## 2016-04-30 DIAGNOSIS — I509 Heart failure, unspecified: Secondary | ICD-10-CM | POA: Diagnosis not present

## 2016-04-30 DIAGNOSIS — R29818 Other symptoms and signs involving the nervous system: Secondary | ICD-10-CM | POA: Diagnosis not present

## 2016-05-20 ENCOUNTER — Other Ambulatory Visit: Payer: Self-pay

## 2016-05-30 DIAGNOSIS — I509 Heart failure, unspecified: Secondary | ICD-10-CM | POA: Diagnosis not present

## 2016-05-30 DIAGNOSIS — R29818 Other symptoms and signs involving the nervous system: Secondary | ICD-10-CM | POA: Diagnosis not present

## 2016-05-30 DIAGNOSIS — G4734 Idiopathic sleep related nonobstructive alveolar hypoventilation: Secondary | ICD-10-CM | POA: Diagnosis not present

## 2016-06-03 DIAGNOSIS — I25118 Atherosclerotic heart disease of native coronary artery with other forms of angina pectoris: Secondary | ICD-10-CM | POA: Diagnosis not present

## 2016-06-03 DIAGNOSIS — I10 Essential (primary) hypertension: Secondary | ICD-10-CM | POA: Diagnosis not present

## 2016-06-03 DIAGNOSIS — E876 Hypokalemia: Secondary | ICD-10-CM | POA: Diagnosis not present

## 2016-06-03 DIAGNOSIS — R600 Localized edema: Secondary | ICD-10-CM | POA: Diagnosis not present

## 2016-06-14 ENCOUNTER — Other Ambulatory Visit: Payer: Self-pay

## 2016-06-17 ENCOUNTER — Other Ambulatory Visit: Payer: Self-pay

## 2016-06-17 DIAGNOSIS — I10 Essential (primary) hypertension: Secondary | ICD-10-CM

## 2016-06-17 MED ORDER — LOSARTAN POTASSIUM 50 MG PO TABS: 50.0000 mg | ORAL_TABLET | Freq: Two times a day (BID) | ORAL | 5 refills | Status: DC

## 2016-06-30 DIAGNOSIS — G4734 Idiopathic sleep related nonobstructive alveolar hypoventilation: Secondary | ICD-10-CM | POA: Diagnosis not present

## 2016-06-30 DIAGNOSIS — R29818 Other symptoms and signs involving the nervous system: Secondary | ICD-10-CM | POA: Diagnosis not present

## 2016-06-30 DIAGNOSIS — I509 Heart failure, unspecified: Secondary | ICD-10-CM | POA: Diagnosis not present

## 2016-07-11 ENCOUNTER — Other Ambulatory Visit: Payer: Self-pay

## 2016-07-15 ENCOUNTER — Other Ambulatory Visit: Payer: Self-pay

## 2016-07-30 ENCOUNTER — Ambulatory Visit: Payer: Medicare Other | Admitting: Family Medicine

## 2016-07-31 DIAGNOSIS — G4734 Idiopathic sleep related nonobstructive alveolar hypoventilation: Secondary | ICD-10-CM | POA: Diagnosis not present

## 2016-07-31 DIAGNOSIS — I509 Heart failure, unspecified: Secondary | ICD-10-CM | POA: Diagnosis not present

## 2016-07-31 DIAGNOSIS — R29818 Other symptoms and signs involving the nervous system: Secondary | ICD-10-CM | POA: Diagnosis not present

## 2016-08-06 ENCOUNTER — Encounter: Payer: Self-pay | Admitting: Family Medicine

## 2016-08-06 ENCOUNTER — Ambulatory Visit (INDEPENDENT_AMBULATORY_CARE_PROVIDER_SITE_OTHER): Payer: Medicare Other | Admitting: Family Medicine

## 2016-08-06 VITALS — BP 138/88 | HR 60 | Ht 71.0 in | Wt 221.0 lb

## 2016-08-06 DIAGNOSIS — E876 Hypokalemia: Secondary | ICD-10-CM | POA: Diagnosis not present

## 2016-08-06 DIAGNOSIS — E790 Hyperuricemia without signs of inflammatory arthritis and tophaceous disease: Secondary | ICD-10-CM

## 2016-08-06 DIAGNOSIS — L0291 Cutaneous abscess, unspecified: Secondary | ICD-10-CM | POA: Diagnosis not present

## 2016-08-06 DIAGNOSIS — Z8739 Personal history of other diseases of the musculoskeletal system and connective tissue: Secondary | ICD-10-CM | POA: Diagnosis not present

## 2016-08-06 DIAGNOSIS — G4700 Insomnia, unspecified: Secondary | ICD-10-CM | POA: Diagnosis not present

## 2016-08-06 DIAGNOSIS — M109 Gout, unspecified: Secondary | ICD-10-CM

## 2016-08-06 DIAGNOSIS — J302 Other seasonal allergic rhinitis: Secondary | ICD-10-CM

## 2016-08-06 MED ORDER — POTASSIUM CHLORIDE CRYS ER 20 MEQ PO TBCR: 20.0000 meq | EXTENDED_RELEASE_TABLET | Freq: Three times a day (TID) | ORAL | 5 refills | Status: DC

## 2016-08-06 MED ORDER — LORATADINE 10 MG PO TABS: 10.0000 mg | ORAL_TABLET | Freq: Every day | ORAL | 5 refills | Status: DC

## 2016-08-06 MED ORDER — TRAZODONE HCL 50 MG PO TABS: 50.0000 mg | ORAL_TABLET | Freq: Every day | ORAL | 5 refills | Status: DC

## 2016-08-06 MED ORDER — ALLOPURINOL 300 MG PO TABS: 300.0000 mg | ORAL_TABLET | Freq: Two times a day (BID) | ORAL | 5 refills | Status: DC

## 2016-08-06 MED ORDER — CEPHALEXIN 500 MG PO CAPS: 500.0000 mg | ORAL_CAPSULE | Freq: Four times a day (QID) | ORAL | 0 refills | Status: DC

## 2016-08-06 NOTE — Progress Notes (Signed)
Name: Larry Lawrence   MRN: [id number]    DOB: 1941-01-22   Date:08/06/2016       Progress Note  Subjective  Chief Complaint  Chief Complaint  Patient presents with  . Allergic Rhinitis   . Insomnia    trazodone  . Gout  . hypokalemia    followup for hypokalemia.   Insomnia  Primary symptoms: fragmented sleep, difficulty falling asleep, no malaise/fatigue.  The current episode started more than one year. The onset quality is sudden. The problem occurs nightly. The problem is unchanged. The treatment provided moderate relief. Typical bedtime:  10-11 P.M..  PMH includes: hypertension, no depression. Prior diagnostic workup includes:  No prior workup.  Sinus Problem  This is a chronic problem. The current episode started more than 1 year ago. The problem has been waxing and waning since onset. There has been no fever. The pain is moderate. Associated symptoms include congestion and sinus pressure. Pertinent negatives include no chills, coughing, diaphoresis, ear pain, headaches, hoarse voice, neck pain, shortness of breath, sneezing, sore throat or swollen glands. Past treatments include nothing. The treatment provided mild relief.  Other  This is a chronic (elevated uric acid) problem. The current episode started more than 1 year ago. The problem occurs intermittently. The problem has been unchanged. Associated symptoms include arthralgias, congestion and joint swelling. Pertinent negatives include no abdominal pain, anorexia, chest pain, chills, coughing, diaphoresis, fever, headaches, myalgias, nausea, neck pain, rash, sore throat or swollen glands. Nothing aggravates the symptoms. Treatments tried: allopurinol. The treatment provided significant relief.  Groin Pain  The patient's primary symptoms include scrotal swelling. The patient's pertinent negatives include no genital injury, genital itching, genital lesions, pelvic pain, penile discharge, penile pain, priapism or testicular pain. This  is a new problem. The problem has been gradually worsening. The pain is medium. Pertinent negatives include no abdominal pain, anorexia, chest pain, chills, constipation, coughing, diarrhea, dysuria, fever, flank pain, frequency, headaches, joint pain, nausea, rash, shortness of breath, sore throat or urgency. The symptoms are aggravated by activity. He has tried nothing for the symptoms. (Cad)    No problem-specific Assessment & Plan notes found for this encounter.   Past Medical History:  Diagnosis Date  . Allergy   . CHF (congestive heart failure) (HCC)   . Coronary artery disease   . Gout   . Insomnia   . Renal disorder   . Sleeps in sitting position due to orthopnea   . TIA (transient ischemic attack)     Past Surgical History:  Procedure Laterality Date  . CARDIAC CATHETERIZATION N/A 02/02/2016   Procedure: Left Heart Cath and Coronary Angiography;  Surgeon: Lamar Blinks, MD;  Location: [facility] INVASIVE CV LAB;  Service: Cardiovascular;  Laterality: N/A;  . CARDIAC SURGERY     3 stents in "main artery"  . JOINT REPLACEMENT      Family History  Problem Relation Age of Onset  . Hypertension Mother   . Pancreatic cancer Mother   . Hypertension Father   . Prostate cancer Father     Social History   Social History  . Marital status: Married    Spouse name: N/A  . Number of children: N/A  . Years of education: N/A   Occupational History  . Not on file.   Social History Main Topics  . Smoking status: Former Games developer  . Smokeless tobacco: Never Used  . Alcohol use No  . Drug use: No  . Sexual activity: Not Currently  Other Topics Concern  . Not on file   Social History Narrative  . No narrative on file    Allergies  Allergen Reactions  . Niaspan [Niacin Er] Other (See Comments)    Reaction:  Hot flashes      Review of Systems  Constitutional: Negative for chills, diaphoresis, fever, malaise/fatigue and weight loss.  HENT: Positive for congestion and  sinus pressure. Negative for ear discharge, ear pain, hoarse voice, sinus pain, sneezing, sore throat and tinnitus.   Eyes: Negative for blurred vision.  Respiratory: Negative for cough, sputum production, shortness of breath and wheezing.   Cardiovascular: Negative for chest pain, palpitations and leg swelling.  Gastrointestinal: Negative for abdominal pain, anorexia, blood in stool, constipation, diarrhea, heartburn, melena and nausea.  Genitourinary: Positive for scrotal swelling. Negative for discharge, dysuria, flank pain, frequency, hematuria, pelvic pain, penile pain, testicular pain and urgency.  Musculoskeletal: Positive for arthralgias and joint swelling. Negative for back pain, joint pain, myalgias and neck pain.       Controlled gout  Skin: Negative for rash.  Neurological: Negative for dizziness, tingling, sensory change, focal weakness and headaches.  Endo/Heme/Allergies: Negative for environmental allergies and polydipsia. Does not bruise/bleed easily.  Psychiatric/Behavioral: Negative for depression and suicidal ideas. The patient has insomnia. The patient is not nervous/anxious.      Objective  Vitals:   08/06/16 1044  BP: 138/88  Pulse: 60  Weight: 221 lb (100.2 kg)  Height: 5\' 11"  (1.803 m)    Physical Exam  Constitutional: He is oriented to person, place, and time and well-developed, well-nourished, and in no distress.  HENT:  Head: Normocephalic.  Right Ear: External ear normal.  Left Ear: External ear normal.  Nose: Nose normal.  Mouth/Throat: Oropharynx is clear and moist.  Eyes: Conjunctivae and EOM are normal. Pupils are equal, round, and reactive to light. Right eye exhibits no discharge. Left eye exhibits no discharge. No scleral icterus.  Neck: Normal range of motion. Neck supple. No JVD present. No tracheal deviation present. No thyromegaly present.  Cardiovascular: Normal rate, regular rhythm, normal heart sounds and intact distal pulses.  Exam reveals  no gallop and no friction rub.   No murmur heard. Pulmonary/Chest: Breath sounds normal. No respiratory distress. He has no wheezes. He has no rales.  Abdominal: Soft. Bowel sounds are normal. He exhibits no mass. There is no hepatosplenomegaly. There is no tenderness. There is no rebound, no guarding and no CVA tenderness.  Genitourinary: He exhibits scrotal tenderness.  Genitourinary Comments: Purulent drainage/ with tenderness/swelling base of scrotum  Musculoskeletal: Normal range of motion. He exhibits no edema or tenderness.  Lymphadenopathy:    He has no cervical adenopathy.  Neurological: He is alert and oriented to person, place, and time. He has normal sensation, normal strength, normal reflexes and intact cranial nerves. No cranial nerve deficit.  Skin: Skin is warm. No rash noted.  Psychiatric: Mood and affect normal.      Assessment & Plan  Problem List Items Addressed This Visit      Other   Hypokalemia   Relevant Medications   potassium chloride SA (K-DUR,KLOR-CON) 20 MEQ tablet   Gout   Relevant Medications   allopurinol (ZYLOPRIM) 300 MG tablet    Other Visit Diagnoses    Hx of gout    -  Primary   Asymptomatic hyperuricemia       Chronic seasonal allergic rhinitis, unspecified trigger       Relevant Medications   loratadine (CLARITIN) 10  MG tablet   Insomnia, unspecified type       Relevant Medications   traZODone (DESYREL) 50 MG tablet   Abscess       Relevant Medications   cephALEXin (KEFLEX) 500 MG capsule   Other Relevant Orders   Ambulatory referral to General Surgery     I spent 30 minutes with this patient, More than 50% of that time was spent in face to face education, counseling and care coordination.   Dr. Hayden [facility]  08/06/16

## 2016-08-16 ENCOUNTER — Ambulatory Visit (INDEPENDENT_AMBULATORY_CARE_PROVIDER_SITE_OTHER): Payer: Medicare Other | Admitting: Urology

## 2016-08-16 ENCOUNTER — Encounter: Payer: Self-pay | Admitting: Urology

## 2016-08-16 DIAGNOSIS — L02215 Cutaneous abscess of perineum: Secondary | ICD-10-CM

## 2016-08-16 MED ORDER — CEPHALEXIN 500 MG PO CAPS: 500.0000 mg | ORAL_CAPSULE | Freq: Four times a day (QID) | ORAL | 0 refills | Status: DC

## 2016-08-16 NOTE — Progress Notes (Signed)
08/16/2016 2:39 PM   Larry Lawrence 12/03/1940 [id number]  Referring provider: Duanne Limerickeanna C Jones, MD [address] [place], [place] [id number]  No chief complaint on file.   HPI: 76 year old male referred for further evaluation of scrotal/perineal abscess.  He was seen and evaluated by Dr. Yetta BarreJones on 08/06/2016 at [facility] for this and referred for further evaluation. He noted that he first started having tenderness at the base of his scrotum/perineum [place] 1 week for further evaluation.   He notes today that proximal and one week ago, the area was exquisitely tender and started draining earlier this week. The drainage is subsided.  He denies any fevers, chills, or urinary symptoms. He does have you the slight urinary frequency which he relates to his medication.  He denies a personal history of abscesses or scrotal issues.  He does have multiple medical comorbidities including history of CAD, CHF, amongst others.  PMH: Past Medical History:  Diagnosis Date  . Allergy   . CHF (congestive heart failure) (HCC)   . Coronary artery disease   . Gout   . Insomnia   . Renal disorder   . Sleeps in sitting position due to orthopnea   . TIA (transient ischemic attack)     Surgical History: Past Surgical History:  Procedure Laterality Date  . CARDIAC CATHETERIZATION N/A 02/02/2016   Procedure: Left Heart Cath and Coronary Angiography;  Surgeon: Lamar BlinksBruce J Kowalski, MD;  Location: [facility] INVASIVE CV LAB;  Service: Cardiovascular;  Laterality: N/A;  . CARDIAC SURGERY     3 stents in "main artery"  . JOINT REPLACEMENT      Home Medications:  Allergies as of 08/16/2016      Reactions   Niaspan [niacin Er] Other (See Comments)   Reaction:  Hot flashes       Medication List       Accurate as of 08/16/16  2:39 PM. Always use your most recent med list.          allopurinol 300 MG tablet Commonly known as:  ZYLOPRIM Take 1 tablet (300 mg total) by mouth 2 (two) times  daily.   amiodarone 200 MG tablet Commonly known as:  PACERONE Take 1 tablet (200 mg total) by mouth 2 (two) times daily.   aspirin 81 MG EC tablet Take 81 mg by mouth daily.   carvedilol 12.5 MG tablet Commonly known as:  COREG Take 1 tablet (12.5 mg total) by mouth 2 (two) times daily with a meal.   cephALEXin 500 MG capsule Commonly known as:  KEFLEX Take 1 capsule (500 mg total) by mouth 4 (four) times daily.   furosemide 40 MG tablet Commonly known as:  LASIX Take 1 tablet (40 mg total) by mouth 2 (two) times daily. Take 2 tabs in the morning   hydrALAZINE 25 MG tablet Commonly known as:  APRESOLINE Take 1 tablet (25 mg total) by mouth 3 (three) times daily.   isosorbide mononitrate 30 MG 24 hr tablet Commonly known as:  IMDUR Take 30 mg by mouth daily. Dr Gwen PoundsKowalski   loratadine 10 MG tablet Commonly known as:  CLARITIN Take 1 tablet (10 mg total) by mouth daily.   losartan 50 MG tablet Commonly known as:  COZAAR Take 1 tablet (50 mg total) by mouth 2 (two) times daily.   metolazone 2.5 MG tablet Commonly known as:  ZAROXOLYN Take 1 tablet (2.5 mg total) by mouth every Monday, Wednesday, and Friday.   potassium chloride SA  20 MEQ tablet Commonly known as:  K-DUR,KLOR-CON Take 1 tablet (20 mEq total) by mouth 3 (three) times daily.   timolol 0.5 % ophthalmic solution Commonly known as:  TIMOPTIC Place 1 drop into both eyes 2 (two) times daily. Dr Inez Pilgrim   traZODone 50 MG tablet Commonly known as:  DESYREL Take 1 tablet (50 mg total) by mouth at bedtime.       Allergies:  Allergies  Allergen Reactions  . Niaspan [Niacin Er] Other (See Comments)    Reaction:  Hot flashes     Family History: Family History  Problem Relation Age of Onset  . Hypertension Mother   . Pancreatic cancer Mother   . Hypertension Father   . Prostate cancer Father     Social History:  reports that he has quit smoking. He has never used smokeless tobacco. He reports  that he does not drink alcohol or use drugs.  ROS: UROLOGY Frequent Urination?: Yes Hard to postpone urination?: Yes Burning/pain with urination?: No Get up at night to urinate?: Yes Leakage of urine?: Yes Urine stream starts and stops?: No Trouble starting stream?: No Do you have to strain to urinate?: No Blood in urine?: No Urinary tract infection?: No Sexually transmitted disease?: No Injury to kidneys or bladder?: No Painful intercourse?: No Weak stream?: No Erection problems?: No Penile pain?: No  Gastrointestinal Nausea?: No Vomiting?: No Indigestion/heartburn?: No Diarrhea?: No Constipation?: No  Constitutional Fever: No Night sweats?: No Weight loss?: No Fatigue?: No  Skin Skin rash/lesions?: No Itching?: No  Eyes Blurred vision?: No Double vision?: No  Ears/Nose/Throat Sore throat?: No Sinus problems?: No  Hematologic/Lymphatic Swollen glands?: No Easy bruising?: No  Cardiovascular Leg swelling?: Yes Chest pain?: No  Respiratory Cough?: No Shortness of breath?: Yes  Endocrine Excessive thirst?: No  Musculoskeletal Back pain?: No Joint pain?: No  Neurological Headaches?: No Dizziness?: No  Psychologic Depression?: No Anxiety?: No  Physical Exam: BP (!) 172/89   Pulse 65   Ht 5\' 11"  (1.803 m)   Wt 221 lb (100.2 kg)   BMI 30.82 kg/m   Constitutional:  Alert and oriented, No acute distress.  Ambulating with cane. HEENT: [place] AT, moist mucus membranes.  Trachea midline, no masses. Cardiovascular: No clubbing, cyanosis, or edema. Respiratory: Normal respiratory effort, no increased work of breathing. GI: Abdomen is soft, nontender, nondistended, no abdominal masses GU: Circumcised phallus with orthotopic meatus. Bilateral descended testicles without any scrotal edema, tenderness, or skin changes. Within the perineum just adjacent to the anus there is a small area of induration out fluctuance. No expressible drainage  appreciated. Skin: No rashes, bruises or suspicious lesions. Lymph: No nguinal adenopathy. Neurologic: Grossly intact, no focal deficits, moving all 4 extremities. Psychiatric: Normal mood and affect.  Laboratory Data: Lab Results  Component Value Date   WBC 6.6 02/02/2016   HGB 12.6 (L) 02/02/2016   HCT 37.3 (L) 02/02/2016   MCV 86.2 02/02/2016   PLT 159 02/02/2016    Lab Results  Component Value Date   CREATININE 1.44 (H) 02/05/2016    Lab Results  Component Value Date   HGBA1C 6.3 (H) 02/20/2015    Urinalysis    Component Value Date/Time   COLORURINE YELLOW (A) 02/02/2016 0516   APPEARANCEUR CLEAR (A) 02/02/2016 0516   LABSPEC 1.016 02/02/2016 0516   PHURINE 5.0 02/02/2016 0516   GLUCOSEU NEGATIVE 02/02/2016 0516   HGBUR NEGATIVE 02/02/2016 0516   BILIRUBINUR NEGATIVE 02/02/2016 0516   KETONESUR NEGATIVE 02/02/2016 0516   PROTEINUR  100 (A) 02/02/2016 0516   NITRITE NEGATIVE 02/02/2016 0516   LEUKOCYTESUR NEGATIVE 02/02/2016 0516    Pertinent Imaging: n/a  Assessment & Plan:    1. Perineal abscess Perineal abscess spontaneously drained earlier this week, only residual slight induration No obvious drainable collection Recommend additional 1 week of antibiotics, return if she develops worsening pain, fevers, or any other warning symptoms Given the location of the lesion, she may be better served by general surgery as the location is more perineal/perirectal and scrotal in nature - cephALEXin (KEFLEX) 500 MG capsule; Take 1 capsule (500 mg total) by mouth 4 (four) times daily.  Dispense: 28 capsule; Refill: 0   Return if symptoms worsen or fail to improve.  Vanna Scotland, MD  [facility] [address] [place], [place] [address] [phone]

## 2016-08-22 IMAGING — CR DG CHEST 1V PORT
1 series · 1 of 1 positions shown · non-contrast
Comparison: None.

CLINICAL DATA: Shortness of breath.

EXAM:
PORTABLE CHEST - 1 VIEW

[ap]
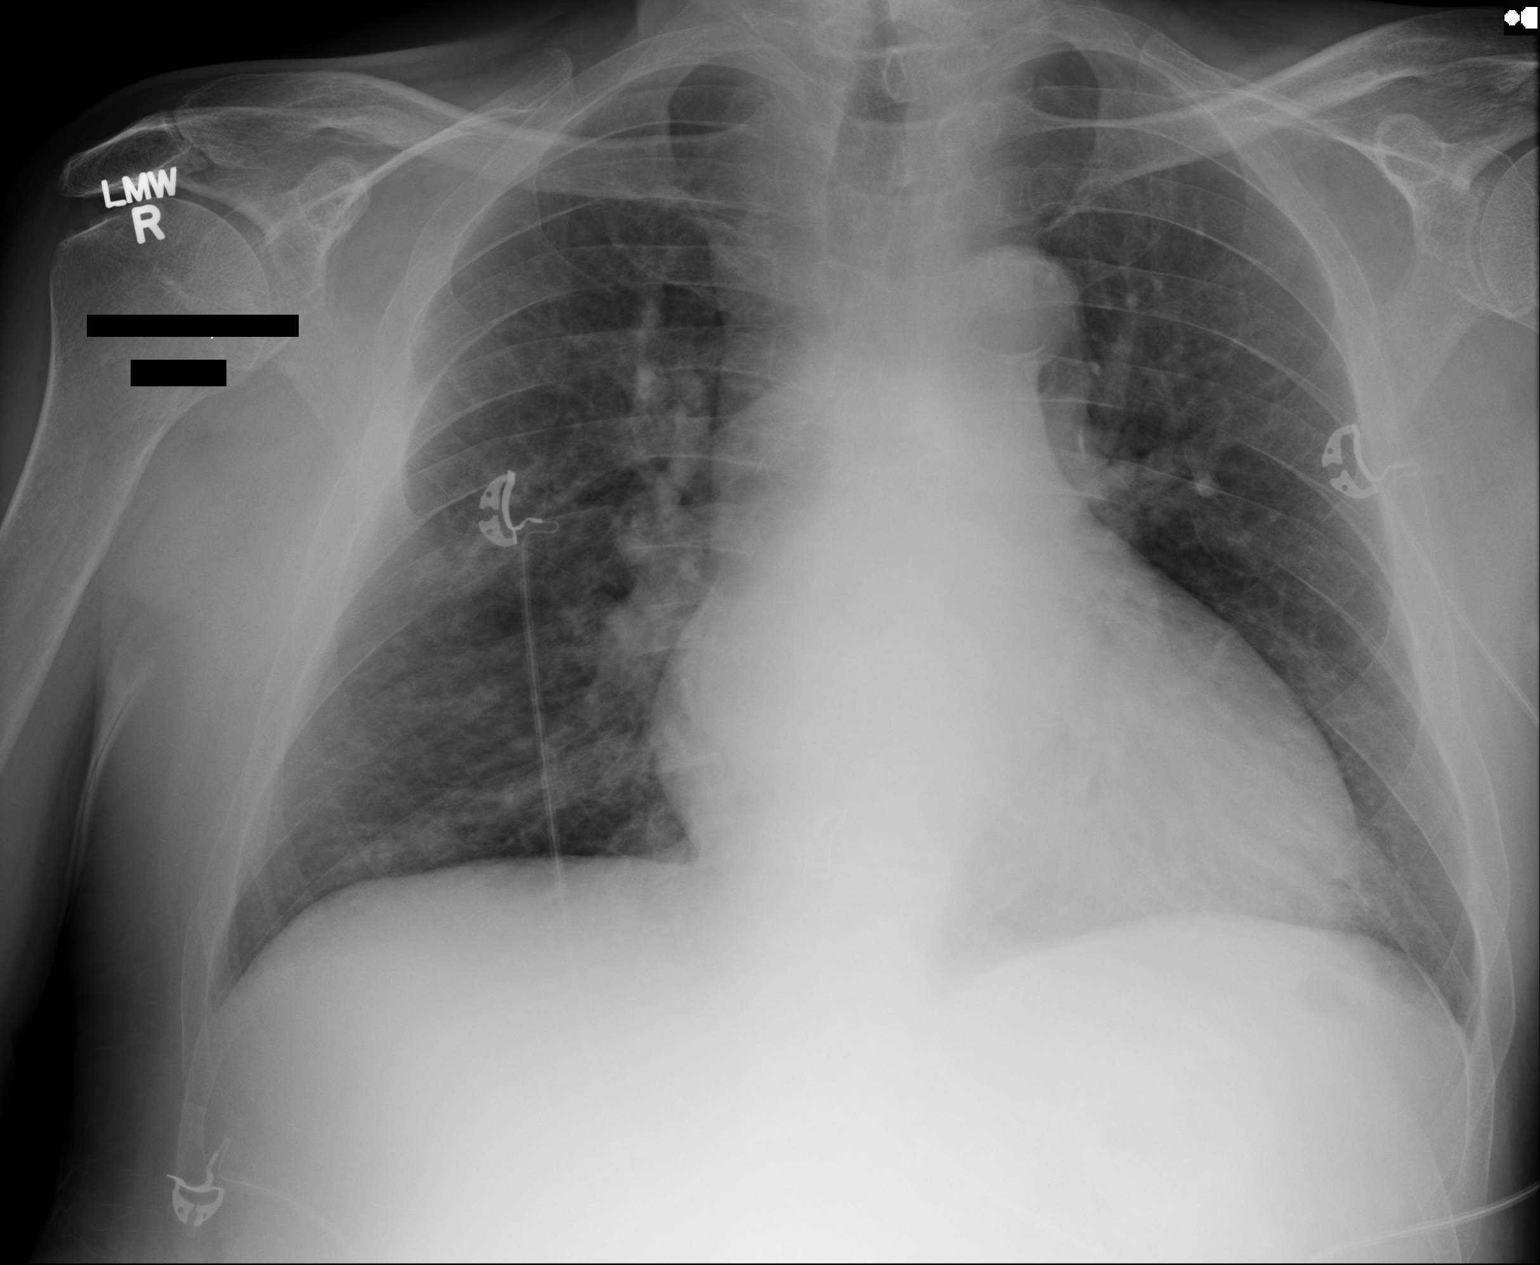

[1 of 1 positions shown; findings below may reference images not displayed]

FINDINGS: Stable cardiomediastinal silhouette. Left lung is clear. Small focal
ill-defined density is seen in right midlung which may represent
pneumonia or subsegmental atelectasis. No pneumothorax or pleural
effusion is noted. The visualized skeletal structures are
unremarkable.
IMPRESSION: Small ill-defined low density seen in right midlung which may
represent pneumonia or subsegmental atelectasis. Short-term follow
radiographs are recommended to ensure resolution and rule out the
possibility of underlying neoplasm.

## 2016-08-28 DIAGNOSIS — I509 Heart failure, unspecified: Secondary | ICD-10-CM | POA: Diagnosis not present

## 2016-08-28 DIAGNOSIS — R29818 Other symptoms and signs involving the nervous system: Secondary | ICD-10-CM | POA: Diagnosis not present

## 2016-08-28 DIAGNOSIS — G4734 Idiopathic sleep related nonobstructive alveolar hypoventilation: Secondary | ICD-10-CM | POA: Diagnosis not present

## 2016-09-28 DIAGNOSIS — G4734 Idiopathic sleep related nonobstructive alveolar hypoventilation: Secondary | ICD-10-CM | POA: Diagnosis not present

## 2016-09-28 DIAGNOSIS — R29818 Other symptoms and signs involving the nervous system: Secondary | ICD-10-CM | POA: Diagnosis not present

## 2016-09-28 DIAGNOSIS — I509 Heart failure, unspecified: Secondary | ICD-10-CM | POA: Diagnosis not present

## 2016-10-07 DIAGNOSIS — G4733 Obstructive sleep apnea (adult) (pediatric): Secondary | ICD-10-CM | POA: Diagnosis not present

## 2016-10-07 DIAGNOSIS — I5022 Chronic systolic (congestive) heart failure: Secondary | ICD-10-CM | POA: Diagnosis not present

## 2016-10-07 DIAGNOSIS — I10 Essential (primary) hypertension: Secondary | ICD-10-CM | POA: Diagnosis not present

## 2016-10-07 DIAGNOSIS — R600 Localized edema: Secondary | ICD-10-CM | POA: Diagnosis not present

## 2016-10-07 DIAGNOSIS — I25118 Atherosclerotic heart disease of native coronary artery with other forms of angina pectoris: Secondary | ICD-10-CM | POA: Diagnosis not present

## 2016-10-28 DIAGNOSIS — I509 Heart failure, unspecified: Secondary | ICD-10-CM | POA: Diagnosis not present

## 2016-10-28 DIAGNOSIS — G4734 Idiopathic sleep related nonobstructive alveolar hypoventilation: Secondary | ICD-10-CM | POA: Diagnosis not present

## 2016-10-28 DIAGNOSIS — R29818 Other symptoms and signs involving the nervous system: Secondary | ICD-10-CM | POA: Diagnosis not present

## 2016-11-28 DIAGNOSIS — I509 Heart failure, unspecified: Secondary | ICD-10-CM | POA: Diagnosis not present

## 2016-11-28 DIAGNOSIS — R29818 Other symptoms and signs involving the nervous system: Secondary | ICD-10-CM | POA: Diagnosis not present

## 2016-11-28 DIAGNOSIS — G4734 Idiopathic sleep related nonobstructive alveolar hypoventilation: Secondary | ICD-10-CM | POA: Diagnosis not present

## 2016-12-28 DIAGNOSIS — I509 Heart failure, unspecified: Secondary | ICD-10-CM | POA: Diagnosis not present

## 2016-12-28 DIAGNOSIS — R29818 Other symptoms and signs involving the nervous system: Secondary | ICD-10-CM | POA: Diagnosis not present

## 2016-12-28 DIAGNOSIS — G4734 Idiopathic sleep related nonobstructive alveolar hypoventilation: Secondary | ICD-10-CM | POA: Diagnosis not present

## 2017-02-03 ENCOUNTER — Emergency Department: Payer: Medicare Other

## 2017-02-03 ENCOUNTER — Emergency Department
Admission: EM | Admit: 2017-02-03 | Discharge: 2017-02-04 | Disposition: A | Discharge status: Home / Self Care | Payer: Medicare Other | Attending: Emergency Medicine | Admitting: Emergency Medicine

## 2017-02-03 DIAGNOSIS — Z79899 Other long term (current) drug therapy: Secondary | ICD-10-CM | POA: Diagnosis not present

## 2017-02-03 DIAGNOSIS — Z87891 Personal history of nicotine dependence: Secondary | ICD-10-CM | POA: Diagnosis not present

## 2017-02-03 DIAGNOSIS — M79671 Pain in right foot: Secondary | ICD-10-CM | POA: Diagnosis not present

## 2017-02-03 DIAGNOSIS — L03115 Cellulitis of right lower limb: Secondary | ICD-10-CM | POA: Diagnosis not present

## 2017-02-03 DIAGNOSIS — N183 Chronic kidney disease, stage 3 (moderate): Secondary | ICD-10-CM | POA: Diagnosis not present

## 2017-02-03 DIAGNOSIS — R509 Fever, unspecified: Secondary | ICD-10-CM | POA: Diagnosis not present

## 2017-02-03 DIAGNOSIS — Z7982 Long term (current) use of aspirin: Secondary | ICD-10-CM | POA: Insufficient documentation

## 2017-02-03 DIAGNOSIS — I5022 Chronic systolic (congestive) heart failure: Secondary | ICD-10-CM | POA: Diagnosis not present

## 2017-02-03 DIAGNOSIS — M25571 Pain in right ankle and joints of right foot: Secondary | ICD-10-CM | POA: Diagnosis not present

## 2017-02-03 DIAGNOSIS — I2510 Atherosclerotic heart disease of native coronary artery without angina pectoris: Secondary | ICD-10-CM | POA: Insufficient documentation

## 2017-02-03 LAB — CBC WITH DIFFERENTIAL/PLATELET
BASOS ABS: 0.1 10*3/uL (ref 0–0.1)
Basophils Relative: 0 %
Eosinophils Absolute: 0.1 10*3/uL (ref 0–0.7)
Eosinophils Relative: 0 %
HEMATOCRIT: 39.7 % — AB (ref 40.0–52.0)
Hemoglobin: 13.1 g/dL (ref 13.0–18.0)
LYMPHS ABS: 0.8 10*3/uL — AB (ref 1.0–3.6)
LYMPHS PCT: 4 %
MCH: 29.1 pg (ref 26.0–34.0)
MCHC: 33 g/dL (ref 32.0–36.0)
MCV: 88.3 fL (ref 80.0–100.0)
MONO ABS: 1.1 10*3/uL — AB (ref 0.2–1.0)
MONOS PCT: 6 %
NEUTROS ABS: 16.1 10*3/uL — AB (ref 1.4–6.5)
Neutrophils Relative %: 90 %
Platelets: 178 10*3/uL (ref 150–440)
RBC: 4.5 MIL/uL (ref 4.40–5.90)
RDW: 17.6 % — AB (ref 11.5–14.5)
WBC: 18.1 10*3/uL — ABNORMAL HIGH (ref 3.8–10.6)

## 2017-02-03 LAB — COMPREHENSIVE METABOLIC PANEL
ALT: 14 U/L — ABNORMAL LOW (ref 17–63)
ANION GAP: 7 (ref 5–15)
AST: 23 U/L (ref 15–41)
Albumin: 3.7 g/dL (ref 3.5–5.0)
Alkaline Phosphatase: 74 U/L (ref 38–126)
BILIRUBIN TOTAL: 2.1 mg/dL — AB (ref 0.3–1.2)
BUN: 17 mg/dL (ref 6–20)
CO2: 23 mmol/L (ref 22–32)
CREATININE: 1.67 mg/dL — AB (ref 0.61–1.24)
Calcium: 8.7 mg/dL — ABNORMAL LOW (ref 8.9–10.3)
Chloride: 110 mmol/L (ref 101–111)
GFR calc Af Amer: 45 mL/min — ABNORMAL LOW (ref >60)
GFR calc non Af Amer: 39 mL/min — ABNORMAL LOW (ref >60)
GLUCOSE: 127 mg/dL — AB (ref 65–99)
Potassium: 3.6 mmol/L (ref 3.5–5.1)
Sodium: 140 mmol/L (ref 135–145)
Total Protein: 7.1 g/dL (ref 6.5–8.1)

## 2017-02-03 MED ORDER — CEPHALEXIN 500 MG PO CAPS
500.0000 mg | ORAL_CAPSULE | Freq: Once | ORAL | Status: AC
  Administered 2017-02-03: 500 mg via ORAL
  Filled 2017-02-03: qty 1

## 2017-02-03 MED ORDER — COLCHICINE 0.6 MG PO TABS
1.2000 mg | ORAL_TABLET | Freq: Once | ORAL | Status: AC
  Administered 2017-02-03: 1.2 mg via ORAL
  Filled 2017-02-03: qty 2

## 2017-02-03 MED ORDER — SULFAMETHOXAZOLE-TRIMETHOPRIM 800-160 MG PO TABS
1.0000 | ORAL_TABLET | Freq: Once | ORAL | Status: AC
  Administered 2017-02-03: 1 via ORAL
  Filled 2017-02-03: qty 1

## 2017-02-03 NOTE — ED Triage Notes (Signed)
Pt brought in from home by [facility].  Pt was noted to feel hot to the touch earlier, but no temperature was taken at that time.  Pt was given 1g of tylenol 3 hours ago.  Pt A&Ox4 at this time.  Pt also c/o R ankle pain and states that he feels like his gout is returning.  No other complaints at this time.

## 2017-02-03 NOTE — ED Provider Notes (Signed)
[facility] Emergency Department Provider Note  ____________________________________________   First MD Initiated Contact with Patient 02/03/17 2308     (approximate)  I have reviewed the triage vital signs and the nursing notes.   HISTORY  Chief Complaint Fever    HPI Larry Lawrence is a 76 y.o. male who comes to the emergency department via EMS with roughly 6 hours of moderate severity aching discomfort in his right foot and fever. 3 hours ago he took 2 tablets of Tylenol for his fever which he did not measure. EMS noted a temperature of 99.9 en route.He's had no chest pain or shortness of breath. No headache. No numbness or weakness. No abdominal pain nausea or vomiting. He has not had a gouty flare since 2006 area daily in the past he said he took colchicine with good results. He has had cellulitis recently.   Past Medical History:  Diagnosis Date  . Allergy   . CHF (congestive heart failure) (HCC)   . Coronary artery disease   . Gout   . Insomnia   . Renal disorder   . Sleeps in sitting position due to orthopnea   . TIA (transient ischemic attack)     Patient Active Problem List   Diagnosis Date Noted  . Gout 02/01/2016  . Syncope 08/09/2015  . Hypokalemia 08/09/2015  . CKD (chronic kidney disease), stage III 02/20/2015  . Chronic systolic CHF (congestive heart failure) (HCC) 02/20/2015  . CKD (chronic kidney disease) 02/20/2015    Past Surgical History:  Procedure Laterality Date  . CARDIAC CATHETERIZATION N/A 02/02/2016   Procedure: Left Heart Cath and Coronary Angiography;  Surgeon: Lamar Blinks, MD;  Location: [facility] INVASIVE CV LAB;  Service: Cardiovascular;  Laterality: N/A;  . CARDIAC SURGERY     3 stents in "main artery"  . JOINT REPLACEMENT      Prior to Admission medications   Medication Sig Start Date End Date Taking? Authorizing Provider  allopurinol (ZYLOPRIM) 300 MG tablet Take 1 tablet (300 mg total) by mouth 2  (two) times daily. 08/06/16   Duanne Limerick, MD  amiodarone (PACERONE) 200 MG tablet Take 1 tablet (200 mg total) by mouth 2 (two) times daily. Patient taking differently: Take 200 mg by mouth daily. Dr Gwen Pounds 02/05/16   Alford Highland, MD  aspirin 81 MG EC tablet Take 81 mg by mouth daily.    [provider]  carvedilol (COREG) 12.5 MG tablet Take 1 tablet (12.5 mg total) by mouth 2 (two) times daily with a meal. Patient taking differently: Take 25 mg by mouth 2 (two) times daily with a meal. Dr Gwen Pounds 01/04/16   Duanne Limerick, MD  cephALEXin (KEFLEX) 500 MG capsule Take 1 capsule (500 mg total) by mouth 3 (three) times daily. 02/04/17 02/11/17  Merrily Brittle, MD  furosemide (LASIX) 40 MG tablet Take 1 tablet (40 mg total) by mouth 2 (two) times daily. Take 2 tabs in the morning Patient taking differently: Take 40 mg by mouth 2 (two) times daily. Take 2 tabs in the morning- Dr Gwen Pounds 02/05/16   Alford Highland, MD  hydrALAZINE (APRESOLINE) 25 MG tablet Take 1 tablet (25 mg total) by mouth 3 (three) times daily. Patient taking differently: Take 50 mg by mouth 3 (three) times daily. Dr Gwen Pounds 01/04/16   Duanne Limerick, MD  isosorbide mononitrate (IMDUR) 30 MG 24 hr tablet Take 30 mg by mouth daily. Dr Gwen Pounds    [provider]  loratadine (  CLARITIN) 10 MG tablet Take 1 tablet (10 mg total) by mouth daily. 08/06/16   Duanne LimerickJones, Deanna C, MD  losartan (COZAAR) 50 MG tablet Take 1 tablet (50 mg total) by mouth 2 (two) times daily. Patient taking differently: Take 50 mg by mouth daily. Dr Gwen PoundsKowalski 06/17/16   Duanne LimerickJones, Deanna C, MD  metolazone (ZAROXOLYN) 2.5 MG tablet Take 1 tablet (2.5 mg total) by mouth every Monday, Wednesday, and Friday. Patient taking differently: Take 2.5 mg by mouth every Monday, Wednesday, and Friday. Dr Micah NoelLance 02/05/16   Alford HighlandWieting, Richard, MD  potassium chloride SA (K-DUR,KLOR-CON) 20 MEQ tablet Take 1 tablet (20 mEq total) by mouth 3 (three) times daily. 08/06/16    Duanne LimerickJones, Deanna C, MD  sulfamethoxazole-trimethoprim (BACTRIM DS,SEPTRA DS) 800-160 MG tablet Take 1 tablet by mouth 2 (two) times daily. 02/04/17 02/11/17  Merrily Brittleifenbark, Brewer Hitchman, MD  timolol (TIMOPTIC) 0.5 % ophthalmic solution Place 1 drop into both eyes 2 (two) times daily. Dr Inez PilgrimBrasington    [provider]  traZODone (DESYREL) 50 MG tablet Take 1 tablet (50 mg total) by mouth at bedtime. 08/06/16   Duanne LimerickJones, Deanna C, MD    Allergies Niaspan [niacin er]  Family History  Problem Relation Age of Onset  . Hypertension Mother   . Pancreatic cancer Mother   . Hypertension Father   . Prostate cancer Father     Social History Social History  Substance Use Topics  . Smoking status: Former Games developermoker  . Smokeless tobacco: Never Used  . Alcohol use No    Review of Systems Constitutional:Positive fever Eyes: No visual changes. ENT: No sore throat. Cardiovascular: Denies chest pain. Respiratory: Denies shortness of breath. Gastrointestinal: No abdominal pain.  No nausea, no vomiting.  No diarrhea.  No constipation. Genitourinary: Negative for dysuria. Musculoskeletal: Negative for back pain. Skin: Positive for rash. Neurological: Negative for headaches, focal weakness or numbness.   ____________________________________________   PHYSICAL EXAM:  VITAL SIGNS: ED Triage Vitals  Enc Vitals Group     BP      Pulse      Resp      Temp      Temp src      SpO2      Weight      Height      Head Circumference      Peak Flow      Pain Score      Pain Loc      Pain Edu?      Excl. in GC?     Constitutional: Alert and oriented 4 very well-appearing nontoxic no diaphoresis speaks in full clear sentences Eyes: PERRL EOMI. Head: Atraumatic. Nose: No congestion/rhinnorhea. Mouth/Throat: No trismus Neck: No stridor.   Cardiovascular: Normal rate, regular rhythm. Grossly normal heart sounds.  Good peripheral circulation. Respiratory: Normal respiratory effort.  No retractions. Lungs  CTAB and moving good air Gastrointestinal: Soft nontender Musculoskeletal: 2+ pitting edema bilateral lower extremities legs are equal in size Neurologic:  Normal speech and language. No gross focal neurologic deficits are appreciated. Skin:  Faint erythema over the right great toe and distally along the foot. No bulla blisters sloughing or crepitus no signs of necrotizing soft tissue infection 2+ dorsalis pedis pulse Psychiatric: Mood and affect are normal. Speech and behavior are normal.    ____________________________________________   DIFFERENTIAL includes but not limited to  Gout, septic joint, direct, cellulitis, necrotizing soft tissue infection ____________________________________________   LABS (all labs ordered are listed, but only abnormal results are displayed)  Labs Reviewed  CBC WITH DIFFERENTIAL/PLATELET - Abnormal; Notable for the following:       Result Value   WBC 18.1 (*)    HCT 39.7 (*)    RDW 17.6 (*)    Neutro Abs 16.1 (*)    Lymphs Abs 0.8 (*)    Monocytes Absolute 1.1 (*)    All other components within normal limits  COMPREHENSIVE METABOLIC PANEL - Abnormal; Notable for the following:    Glucose, Bld 127 (*)    Creatinine, Ser 1.67 (*)    Calcium 8.7 (*)    ALT 14 (*)    Total Bilirubin 2.1 (*)    GFR calc non Af Amer 39 (*)    GFR calc Af Amer 45 (*)    All other components within normal limits    Elevated white count is nonspecific and could be secondary to pain versus infection. Chronic kidney disease with baseline __________________________________________  EKG  ED ECG REPORT I, Merrily Brittle, the attending physician, personally viewed and interpreted this ECG.  Date: 02/04/2017 Rate: 83 Rhythm: normal sinus rhythm QRS Axis: normal Intervals: normal ST/T Wave abnormalities: normal Narrative Interpretation: Ventricular bigeminy normal sinus rhythm unremarkable EKG  ____________________________________________  RADIOLOGY  X-ray of  the foot with no acute disease ____________________________________________   PROCEDURES  Procedure(s) performed: no  Procedures  Critical Care performed: no  Observation: no ____________________________________________   INITIAL IMPRESSION / ASSESSMENT AND PLAN / ED COURSE  Pertinent labs & imaging results that were available during my care of the patient were reviewed by me and considered in my medical decision making (see chart for details).  The patient arrives afebrile very well-appearing joking and laughing. He has strong distal pulses and mild erythema across the distal aspect of his right foot. She is able to fully range his ankle and toes and I do not have any suspicion for septic joint. His history and exam is not exactly classic for podagra but I will treat him with a single dose of colchicine and reevaluate.  The patient's pain is only minimally improved after colchicine which makes gout less likely and cellulitis more likely. Bactrim and Keflex now.     ----------------------------------------- 12:54 AM on 02/04/2017 -----------------------------------------  The patient is very well-appearing, is hemodynamically stable, with very minor cellulitis. I have ordered a given him a first dose of Bactrim and Keflex here tonight and I will prescribe him a 7 day course for outpatient. At this point there is no indication for inpatient admission and the patient is medically stable for outpatient management. The patient's daughter is concerned that the patient will be alone at home tonight so I offered to keep the patient here in the emergency department overnight, however they declined stating he would prefer to have him go home instead. ____________________________________________   FINAL CLINICAL IMPRESSION(S) / ED DIAGNOSES  Final diagnoses:  Cellulitis of right lower extremity      NEW MEDICATIONS STARTED DURING THIS VISIT:  Discharge Medication List as of  02/04/2017 12:53 AM    START taking these medications   Details  sulfamethoxazole-trimethoprim (BACTRIM DS,SEPTRA DS) 800-160 MG tablet Take 1 tablet by mouth 2 (two) times daily., Starting Tue 02/04/2017, Until Tue 02/11/2017, Print         Note:  This document was prepared using Dragon voice recognition software and may include unintentional dictation errors.     Merrily Brittle, MD 02/04/17 [id number]

## 2017-02-04 MED ORDER — SULFAMETHOXAZOLE-TRIMETHOPRIM 800-160 MG PO TABS: 1.0000 | ORAL_TABLET | Freq: Two times a day (BID) | ORAL | 0 refills | Status: AC

## 2017-02-04 MED ORDER — CEPHALEXIN 500 MG PO CAPS
500.0000 mg | ORAL_CAPSULE | Freq: Three times a day (TID) | ORAL | 0 refills | Status: AC
Start: 2017-02-04 — End: 2017-02-11

## 2017-02-04 NOTE — ED Notes (Signed)
Pt discharged to home.  Family member driving.  Discharge instructions reviewed.  Verbalized understanding.  No questions or concerns at this time.  Teach back verified.  Pt in NAD.  No items left in ED.   

## 2017-02-04 NOTE — Discharge Instructions (Signed)
Please take both of your antibiotics as prescribed and make an appointment to follow-up with your primary care physician in 2 days for a recheck. Return to the emergency department sooner for any new or worsening symptoms such as worsening pain, if you cannot walk, fevers, chills, or for any other concerns.  It was a pleasure to take care of you today, and thank you for coming to our emergency department.  If you have any questions or concerns before leaving please ask the nurse to grab me and I'm more than happy to go through your aftercare instructions again.  If you were prescribed any opioid pain medication today such as Norco, Vicodin, Percocet, morphine, hydrocodone, or oxycodone please make sure you do not drive when you are taking this medication as it can alter your ability to drive safely.  If you have any concerns once you are home that you are not improving or are in fact getting worse before you can make it to your follow-up appointment, please do not hesitate to call [phone] and come back for further evaluation.  Merrily Brittle, MD  Results for orders placed or performed during the hospital encounter of 02/03/17  CBC with Differential  Result Value Ref Range   WBC 18.1 (H) 3.8 - 10.6 K/uL   RBC 4.50 4.40 - 5.90 MIL/uL   Hemoglobin 13.1 13.0 - 18.0 g/dL   HCT 16.1 (L) 09.6 - 04.5 %   MCV 88.3 80.0 - 100.0 fL   MCH 29.1 26.0 - 34.0 pg   MCHC 33.0 32.0 - 36.0 g/dL   RDW 40.9 (H) 81.1 - 91.4 %   Platelets 178 150 - 440 K/uL   Neutrophils Relative % 90 %   Neutro Abs 16.1 (H) 1.4 - 6.5 K/uL   Lymphocytes Relative 4 %   Lymphs Abs 0.8 (L) 1.0 - 3.6 K/uL   Monocytes Relative 6 %   Monocytes Absolute 1.1 (H) 0.2 - 1.0 K/uL   Eosinophils Relative 0 %   Eosinophils Absolute 0.1 0 - 0.7 K/uL   Basophils Relative 0 %   Basophils Absolute 0.1 0 - 0.1 K/uL  Comprehensive metabolic panel  Result Value Ref Range   Sodium 140 135 - 145 mmol/L   Potassium 3.6 3.5 - 5.1 mmol/L   Chloride  110 101 - 111 mmol/L   CO2 23 22 - 32 mmol/L   Glucose, Bld 127 (H) 65 - 99 mg/dL   BUN 17 6 - 20 mg/dL   Creatinine, Ser 7.82 (H) 0.61 - 1.24 mg/dL   Calcium 8.7 (L) 8.9 - 10.3 mg/dL   Total Protein 7.1 6.5 - 8.1 g/dL   Albumin 3.7 3.5 - 5.0 g/dL   AST 23 15 - 41 U/L   ALT 14 (L) 17 - 63 U/L   Alkaline Phosphatase 74 38 - 126 U/L   Total Bilirubin 2.1 (H) 0.3 - 1.2 mg/dL   GFR calc non Af Amer 39 (L) >60 mL/min   GFR calc Af Amer 45 (L) >60 mL/min   Anion gap 7 5 - 15   Dg Foot Complete Right  Result Date: 02/03/2017 CLINICAL DATA:  Acute onset of right foot pain.  Initial encounter. EXAM: RIGHT FOOT COMPLETE - 3+ VIEW COMPARISON:  None. FINDINGS: There is no evidence of fracture or dislocation. The joint spaces are preserved. There is no evidence of talar subluxation; the subtalar joint is unremarkable in appearance. Soft tissue swelling is noted about the forefoot. IMPRESSION: No evidence of fracture or  dislocation. Electronically Signed   By: Roanna RaiderJeffery  Chang M.D.   On: 02/03/2017 23:36

## 2017-02-04 NOTE — ED Notes (Signed)
Family refused this RN to help patient to car.

## 2017-02-04 NOTE — ED Notes (Signed)
Signature pad not working at this time, patient and family acknowledged discharge instructions.

## 2017-02-04 NOTE — ED Notes (Signed)
Larry Lawrence was offered versus crutches, daughter refused.

## 2017-02-06 DIAGNOSIS — I5022 Chronic systolic (congestive) heart failure: Secondary | ICD-10-CM | POA: Diagnosis not present

## 2017-02-06 DIAGNOSIS — I10 Essential (primary) hypertension: Secondary | ICD-10-CM | POA: Diagnosis not present

## 2017-02-06 DIAGNOSIS — R600 Localized edema: Secondary | ICD-10-CM | POA: Diagnosis not present

## 2017-02-06 DIAGNOSIS — L03119 Cellulitis of unspecified part of limb: Secondary | ICD-10-CM | POA: Diagnosis not present

## 2017-02-06 DIAGNOSIS — I2510 Atherosclerotic heart disease of native coronary artery without angina pectoris: Secondary | ICD-10-CM | POA: Diagnosis not present

## 2017-02-07 DIAGNOSIS — M10071 Idiopathic gout, right ankle and foot: Secondary | ICD-10-CM | POA: Diagnosis not present

## 2017-02-07 DIAGNOSIS — M79675 Pain in left toe(s): Secondary | ICD-10-CM | POA: Diagnosis not present

## 2017-02-07 DIAGNOSIS — M79674 Pain in right toe(s): Secondary | ICD-10-CM | POA: Diagnosis not present

## 2017-02-07 DIAGNOSIS — B351 Tinea unguium: Secondary | ICD-10-CM | POA: Diagnosis not present

## 2017-02-14 ENCOUNTER — Other Ambulatory Visit: Payer: Self-pay | Admitting: Family Medicine

## 2017-02-14 DIAGNOSIS — M109 Gout, unspecified: Secondary | ICD-10-CM

## 2017-02-17 DIAGNOSIS — I5022 Chronic systolic (congestive) heart failure: Secondary | ICD-10-CM | POA: Diagnosis not present

## 2017-03-31 DIAGNOSIS — I5022 Chronic systolic (congestive) heart failure: Secondary | ICD-10-CM | POA: Diagnosis not present

## 2017-03-31 DIAGNOSIS — I10 Essential (primary) hypertension: Secondary | ICD-10-CM | POA: Diagnosis not present

## 2017-03-31 DIAGNOSIS — I2510 Atherosclerotic heart disease of native coronary artery without angina pectoris: Secondary | ICD-10-CM | POA: Diagnosis not present

## 2017-04-11 ENCOUNTER — Other Ambulatory Visit: Payer: Self-pay | Admitting: Family Medicine

## 2017-04-11 DIAGNOSIS — M109 Gout, unspecified: Secondary | ICD-10-CM

## 2017-04-15 ENCOUNTER — Ambulatory Visit (INDEPENDENT_AMBULATORY_CARE_PROVIDER_SITE_OTHER): Payer: Medicare Other | Admitting: Family Medicine

## 2017-04-15 ENCOUNTER — Encounter: Payer: Self-pay | Admitting: Family Medicine

## 2017-04-15 VITALS — BP 138/80 | HR 52 | Ht 71.0 in | Wt 207.0 lb

## 2017-04-15 DIAGNOSIS — M109 Gout, unspecified: Secondary | ICD-10-CM | POA: Diagnosis not present

## 2017-04-15 DIAGNOSIS — Z23 Encounter for immunization: Secondary | ICD-10-CM

## 2017-04-15 DIAGNOSIS — L02416 Cutaneous abscess of left lower limb: Secondary | ICD-10-CM | POA: Diagnosis not present

## 2017-04-15 DIAGNOSIS — L02211 Cutaneous abscess of abdominal wall: Secondary | ICD-10-CM

## 2017-04-15 DIAGNOSIS — E790 Hyperuricemia without signs of inflammatory arthritis and tophaceous disease: Secondary | ICD-10-CM | POA: Diagnosis not present

## 2017-04-15 MED ORDER — SULFAMETHOXAZOLE-TRIMETHOPRIM 800-160 MG PO TABS
1.0000 | ORAL_TABLET | Freq: Two times a day (BID) | ORAL | 0 refills | Status: DC
Start: 2017-04-15 — End: 2017-04-25

## 2017-04-15 MED ORDER — ALLOPURINOL 300 MG PO TABS: ORAL_TABLET | ORAL | 11 refills | Status: DC

## 2017-04-15 MED ORDER — CHLORHEXIDINE GLUCONATE 4 % EX LIQD: Freq: Every day | CUTANEOUS | 0 refills | Status: DC | PRN

## 2017-04-15 NOTE — Progress Notes (Signed)
Name: Larry Lawrence   MRN: [id number]    DOB: 09-Oct-1940   Date:04/15/2017       Progress Note  Subjective  Chief Complaint  Chief Complaint  Patient presents with  . Gout    refill allopurinol  . Recurrent Skin Infections    place on front below "belly button and in back on my butt"- some bloody discharge.    Patient presents for evaluation of multiple abscesses.  Patient needs refilss on gout medication   Rash  This is a new problem. The current episode started in the past 7 days. The problem has been gradually worsening since onset. The affected locations include the left upper leg and abdomen. The rash is characterized by pain, redness and draining. He was exposed to nothing. Pertinent negatives include no anorexia, congestion, cough, diarrhea, eye pain, facial edema, fatigue, fever, joint pain, nail changes, rhinorrhea, shortness of breath, sore throat or vomiting.    No problem-specific Assessment & Plan notes found for this encounter.   Past Medical History:  Diagnosis Date  . Allergy   . CHF (congestive heart failure) (HCC)   . Coronary artery disease   . Gout   . Insomnia   . Renal disorder   . Sleeps in sitting position due to orthopnea   . TIA (transient ischemic attack)     Past Surgical History:  Procedure Laterality Date  . CARDIAC CATHETERIZATION N/A 02/02/2016   Procedure: Left Heart Cath and Coronary Angiography;  Surgeon: Lamar Blinks, MD;  Location: [facility] INVASIVE CV LAB;  Service: Cardiovascular;  Laterality: N/A;  . CARDIAC SURGERY     3 stents in "main artery"  . JOINT REPLACEMENT      Family History  Problem Relation Age of Onset  . Hypertension Mother   . Pancreatic cancer Mother   . Hypertension Father   . Prostate cancer Father     Social History   Social History  . Marital status: Married    Spouse name: N/A  . Number of children: N/A  . Years of education: N/A   Occupational History  . Not on file.   Social History Main  Topics  . Smoking status: Former Games developer  . Smokeless tobacco: Never Used  . Alcohol use No  . Drug use: No  . Sexual activity: Not Currently   Other Topics Concern  . Not on file   Social History Narrative  . No narrative on file    Allergies  Allergen Reactions  . Niaspan [Niacin Er] Other (See Comments)    Reaction:  Hot flashes     Outpatient Medications Prior to Visit  Medication Sig Dispense Refill  . amiodarone (PACERONE) 200 MG tablet Take 1 tablet (200 mg total) by mouth 2 (two) times daily. (Patient taking differently: Take 200 mg by mouth daily. Dr Gwen Pounds) 60 tablet 0  . aspirin 81 MG EC tablet Take 81 mg by mouth daily.    . carvedilol (COREG) 12.5 MG tablet Take 1 tablet (12.5 mg total) by mouth 2 (two) times daily with a meal. (Patient taking differently: Take 25 mg by mouth 2 (two) times daily with a meal. Dr Gwen Pounds) 60 tablet 5  . furosemide (LASIX) 40 MG tablet Take 1 tablet (40 mg total) by mouth 2 (two) times daily. Take 2 tabs in the morning (Patient taking differently: Take 40 mg by mouth 2 (two) times daily. Take 2 tabs in the morning- Dr Gwen Pounds) 30 tablet   . hydrALAZINE (APRESOLINE) 25  MG tablet Take 1 tablet (25 mg total) by mouth 3 (three) times daily. (Patient taking differently: Take 50 mg by mouth 3 (three) times daily. Dr Gwen Pounds) 90 tablet 5  . isosorbide mononitrate (IMDUR) 30 MG 24 hr tablet Take 30 mg by mouth daily. Dr Gwen Pounds    . loratadine (CLARITIN) 10 MG tablet Take 1 tablet (10 mg total) by mouth daily. 30 tablet 5  . potassium chloride SA (K-DUR,KLOR-CON) 20 MEQ tablet Take 1 tablet (20 mEq total) by mouth 3 (three) times daily. (Patient taking differently: Take 20 mEq by mouth 3 (three) times daily. Dr Gwen Pounds) 90 tablet 5  . timolol (TIMOPTIC) 0.5 % ophthalmic solution Place 1 drop into both eyes 2 (two) times daily. Dr Inez Pilgrim    . allopurinol (ZYLOPRIM) 300 MG tablet TAKE (1) TABLET BY MOUTH TWICE DAILY 60 tablet 0  . losartan  (COZAAR) 50 MG tablet Take 1 tablet (50 mg total) by mouth 2 (two) times daily. (Patient taking differently: Take 50 mg by mouth daily. Dr Gwen Pounds) 60 tablet 5  . metolazone (ZAROXOLYN) 2.5 MG tablet Take 1 tablet (2.5 mg total) by mouth every Monday, Wednesday, and Friday. (Patient taking differently: Take 2.5 mg by mouth every Monday, Wednesday, and Friday. Dr Micah Noel)    . traZODone (DESYREL) 50 MG tablet Take 1 tablet (50 mg total) by mouth at bedtime. 30 tablet 5   No facility-administered medications prior to visit.     Review of Systems  Constitutional: Negative for chills, fatigue, fever, malaise/fatigue and weight loss.  HENT: Negative for congestion, ear discharge, ear pain, rhinorrhea and sore throat.   Eyes: Negative for blurred vision and pain.  Respiratory: Negative for cough, sputum production, shortness of breath and wheezing.   Cardiovascular: Negative for chest pain, palpitations and leg swelling.  Gastrointestinal: Negative for abdominal pain, anorexia, blood in stool, constipation, diarrhea, heartburn, melena, nausea and vomiting.  Genitourinary: Negative for dysuria, frequency, hematuria and urgency.  Musculoskeletal: Negative for back pain, joint pain, myalgias and neck pain.  Skin: Positive for rash. Negative for nail changes.  Neurological: Negative for dizziness, tingling, sensory change, focal weakness and headaches.  Endo/Heme/Allergies: Negative for environmental allergies and polydipsia. Does not bruise/bleed easily.  Psychiatric/Behavioral: Negative for depression and suicidal ideas. The patient is not nervous/anxious and does not have insomnia.      Objective  Vitals:   04/15/17 1016  BP: 138/80  Pulse: (!) 52  Weight: 207 lb (93.9 kg)  Height:  (1.803 m)    Physical Exam  Constitutional: He is oriented to person, place, and time and well-developed, well-nourished, and in no distress.  HENT:  Head: Normocephalic.  Right Ear: External ear  normal.  Left Ear: External ear normal.  Nose: Nose normal.  Mouth/Throat: Oropharynx is clear and moist.  Eyes: Pupils are equal, round, and reactive to light. Conjunctivae and EOM are normal. Right eye exhibits no discharge. Left eye exhibits no discharge. No scleral icterus.  Neck: Normal range of motion. Neck supple. No JVD present. No tracheal deviation present. No thyromegaly present.  Cardiovascular: Normal rate, regular rhythm, normal heart sounds and intact distal pulses.  Exam reveals no gallop and no friction rub.   No murmur heard. Pulmonary/Chest: Breath sounds normal. No respiratory distress. He has no wheezes. He has no rales.  Abdominal: Soft. Bowel sounds are normal. He exhibits no mass. There is no hepatosplenomegaly. There is no tenderness. There is no rebound, no guarding and no CVA tenderness.  Musculoskeletal: Normal  range of motion. He exhibits no edema or tenderness.  Lymphadenopathy:    He has no cervical adenopathy.  Neurological: He is alert and oriented to person, place, and time. He has normal sensation, normal strength, normal reflexes and intact cranial nerves. No cranial nerve deficit.  Skin: Skin is warm. No rash noted. There is erythema.  Left post/medial thigh/ periumbillicus  Psychiatric: Mood and affect normal.  Nursing note and vitals reviewed.     Assessment & Plan  Problem List Items Addressed This Visit      Other   Gout   Relevant Medications   allopurinol (ZYLOPRIM) 300 MG tablet    Other Visit Diagnoses    Abscess of skin of abdomen    -  Primary   Relevant Medications   sulfamethoxazole-trimethoprim (BACTRIM DS,SEPTRA DS) 800-160 MG tablet   chlorhexidine (HIBICLENS) 4 % external liquid   Other Relevant Orders   Ambulatory referral to General Surgery   Abscess of left thigh       Relevant Medications   sulfamethoxazole-trimethoprim (BACTRIM DS,SEPTRA DS) 800-160 MG tablet   chlorhexidine (HIBICLENS) 4 % external liquid   Other  Relevant Orders   Ambulatory referral to General Surgery   Hyperuricemia       Influenza vaccine needed       Relevant Orders   Flu vaccine HIGH DOSE PF (Completed)      Meds ordered this encounter  Medications  . sulfamethoxazole-trimethoprim (BACTRIM DS,SEPTRA DS) 800-160 MG tablet    Sig: Take 1 tablet by mouth 2 (two) times daily.    Dispense:  20 tablet    Refill:  0  . chlorhexidine (HIBICLENS) 4 % external liquid    Sig: Apply topically daily as needed.    Dispense:  120 mL    Refill:  0  . allopurinol (ZYLOPRIM) 300 MG tablet    Sig: TAKE (1) TABLET BY MOUTH TWICE DAILY    Dispense:  60 tablet    Refill:  11    sched appt for med refill      Dr. Hayden [facility]  04/15/17

## 2017-04-22 DIAGNOSIS — R001 Bradycardia, unspecified: Secondary | ICD-10-CM | POA: Diagnosis not present

## 2017-04-22 DIAGNOSIS — I2510 Atherosclerotic heart disease of native coronary artery without angina pectoris: Secondary | ICD-10-CM | POA: Diagnosis not present

## 2017-04-22 DIAGNOSIS — I10 Essential (primary) hypertension: Secondary | ICD-10-CM | POA: Diagnosis not present

## 2017-04-22 DIAGNOSIS — I5022 Chronic systolic (congestive) heart failure: Secondary | ICD-10-CM | POA: Diagnosis not present

## 2017-04-23 ENCOUNTER — Observation Stay
Admission: EM | Admit: 2017-04-23 | Discharge: 2017-04-25 | Disposition: A | Discharge status: Home / Self Care | Payer: Medicare Other | Attending: Internal Medicine | Admitting: Internal Medicine

## 2017-04-23 ENCOUNTER — Emergency Department: Payer: Medicare Other

## 2017-04-23 ENCOUNTER — Encounter: Payer: Self-pay | Admitting: Emergency Medicine

## 2017-04-23 DIAGNOSIS — Z955 Presence of coronary angioplasty implant and graft: Secondary | ICD-10-CM | POA: Insufficient documentation

## 2017-04-23 DIAGNOSIS — I2510 Atherosclerotic heart disease of native coronary artery without angina pectoris: Secondary | ICD-10-CM | POA: Diagnosis not present

## 2017-04-23 DIAGNOSIS — Z87891 Personal history of nicotine dependence: Secondary | ICD-10-CM | POA: Diagnosis not present

## 2017-04-23 DIAGNOSIS — Z66 Do not resuscitate: Secondary | ICD-10-CM | POA: Diagnosis not present

## 2017-04-23 DIAGNOSIS — K529 Noninfective gastroenteritis and colitis, unspecified: Secondary | ICD-10-CM | POA: Diagnosis not present

## 2017-04-23 DIAGNOSIS — I4891 Unspecified atrial fibrillation: Secondary | ICD-10-CM | POA: Insufficient documentation

## 2017-04-23 DIAGNOSIS — M109 Gout, unspecified: Secondary | ICD-10-CM | POA: Insufficient documentation

## 2017-04-23 DIAGNOSIS — Z951 Presence of aortocoronary bypass graft: Secondary | ICD-10-CM | POA: Diagnosis not present

## 2017-04-23 DIAGNOSIS — R42 Dizziness and giddiness: Secondary | ICD-10-CM | POA: Diagnosis not present

## 2017-04-23 DIAGNOSIS — R55 Syncope and collapse: Secondary | ICD-10-CM | POA: Diagnosis not present

## 2017-04-23 DIAGNOSIS — I110 Hypertensive heart disease with heart failure: Secondary | ICD-10-CM | POA: Insufficient documentation

## 2017-04-23 DIAGNOSIS — I495 Sick sinus syndrome: Secondary | ICD-10-CM | POA: Insufficient documentation

## 2017-04-23 DIAGNOSIS — R001 Bradycardia, unspecified: Secondary | ICD-10-CM | POA: Diagnosis present

## 2017-04-23 DIAGNOSIS — Z8673 Personal history of transient ischemic attack (TIA), and cerebral infarction without residual deficits: Secondary | ICD-10-CM | POA: Insufficient documentation

## 2017-04-23 DIAGNOSIS — Z7982 Long term (current) use of aspirin: Secondary | ICD-10-CM | POA: Diagnosis not present

## 2017-04-23 DIAGNOSIS — G4700 Insomnia, unspecified: Secondary | ICD-10-CM | POA: Diagnosis not present

## 2017-04-23 DIAGNOSIS — I5022 Chronic systolic (congestive) heart failure: Secondary | ICD-10-CM | POA: Insufficient documentation

## 2017-04-23 DIAGNOSIS — I447 Left bundle-branch block, unspecified: Secondary | ICD-10-CM | POA: Diagnosis not present

## 2017-04-23 DIAGNOSIS — R531 Weakness: Secondary | ICD-10-CM | POA: Insufficient documentation

## 2017-04-23 DIAGNOSIS — I429 Cardiomyopathy, unspecified: Secondary | ICD-10-CM | POA: Diagnosis not present

## 2017-04-23 DIAGNOSIS — I130 Hypertensive heart and chronic kidney disease with heart failure and stage 1 through stage 4 chronic kidney disease, or unspecified chronic kidney disease: Secondary | ICD-10-CM | POA: Insufficient documentation

## 2017-04-23 DIAGNOSIS — Z79899 Other long term (current) drug therapy: Secondary | ICD-10-CM | POA: Insufficient documentation

## 2017-04-23 DIAGNOSIS — E876 Hypokalemia: Secondary | ICD-10-CM | POA: Insufficient documentation

## 2017-04-23 DIAGNOSIS — N183 Chronic kidney disease, stage 3 (moderate): Secondary | ICD-10-CM | POA: Diagnosis not present

## 2017-04-23 LAB — BASIC METABOLIC PANEL
Anion gap: 8 (ref 5–15)
BUN: 16 mg/dL (ref 6–20)
CALCIUM: 8.4 mg/dL — AB (ref 8.9–10.3)
CO2: 22 mmol/L (ref 22–32)
Chloride: 107 mmol/L (ref 101–111)
Creatinine, Ser: 1.74 mg/dL — ABNORMAL HIGH (ref 0.61–1.24)
GFR, EST AFRICAN AMERICAN: 43 mL/min — AB (ref >60)
GFR, EST NON AFRICAN AMERICAN: 37 mL/min — AB (ref >60)
Glucose, Bld: 153 mg/dL — ABNORMAL HIGH (ref 65–99)
Potassium: 3.7 mmol/L (ref 3.5–5.1)
SODIUM: 137 mmol/L (ref 135–145)

## 2017-04-23 LAB — CBC
HCT: 42.1 % (ref 40.0–52.0)
HEMOGLOBIN: 13.6 g/dL (ref 13.0–18.0)
MCH: 29.5 pg (ref 26.0–34.0)
MCHC: 32.4 g/dL (ref 32.0–36.0)
MCV: 91.1 fL (ref 80.0–100.0)
Platelets: 212 10*3/uL (ref 150–440)
RBC: 4.62 MIL/uL (ref 4.40–5.90)
RDW: 17.2 % — ABNORMAL HIGH (ref 11.5–14.5)
WBC: 7.8 10*3/uL (ref 3.8–10.6)

## 2017-04-23 LAB — URINALYSIS, COMPLETE (UACMP) WITH MICROSCOPIC
BILIRUBIN URINE: NEGATIVE
Bacteria, UA: NONE SEEN
Glucose, UA: NEGATIVE mg/dL
KETONES UR: NEGATIVE mg/dL
LEUKOCYTES UA: NEGATIVE
NITRITE: NEGATIVE
PROTEIN: 100 mg/dL — AB
Specific Gravity, Urine: 1.006 (ref 1.005–1.030)
WBC UA: NONE SEEN WBC/hpf (ref 0–5)
pH: 7 (ref 5.0–8.0)

## 2017-04-23 LAB — TROPONIN I

## 2017-04-23 MED ORDER — HYDRALAZINE HCL 50 MG PO TABS
50.0000 mg | ORAL_TABLET | Freq: Once | ORAL | Status: AC
  Administered 2017-04-23: 50 mg via ORAL
  Filled 2017-04-23: qty 1

## 2017-04-23 MED ORDER — ONDANSETRON HCL 4 MG/2ML IJ SOLN
4.0000 mg | Freq: Once | INTRAMUSCULAR | Status: AC
  Administered 2017-04-23: 4 mg via INTRAVENOUS
  Filled 2017-04-23: qty 2

## 2017-04-23 NOTE — ED Provider Notes (Signed)
[facility] Emergency Department Provider Note  ____________________________________________  Time seen: Approximately 11:27 PM  I have reviewed the triage vital signs and the nursing notes.   HISTORY  Chief Complaint Dizziness    HPI Larry Lawrence is a 76 y.o. male comes to the ED complaining of lightheadedness today which is waxing and waning, constant. No aggravating or alleviating factors. Associated with vomiting. Denies trauma. Not positional or associated with head turning. No vision changes or peripheral weakness or paresthesias. Denies chest pain or shortness of breath.  Has a history of sick sinus syndrome and bradycardia. Yesterday he saw his cardiologist, his blood pressure was noted to be in the 90s systolic, so his hydralazine was decreased from 50 mg 3 times a day to 25 mg 3 times a day. However, the patient were to 25 g of breakfast today he skipped lunch and has not taken it since. His symptoms became much worse this afternoon.     Past Medical History:  Diagnosis Date  . Allergy   . CHF (congestive heart failure) (HCC)   . Coronary artery disease   . Gout   . Insomnia   . Renal disorder   . Sleeps in sitting position due to orthopnea   . TIA (transient ischemic attack)      Patient Active Problem List   Diagnosis Date Noted  . Gout 02/01/2016  . Syncope 08/09/2015  . Hypokalemia 08/09/2015  . CKD (chronic kidney disease), stage III (HCC) 02/20/2015  . Chronic systolic CHF (congestive heart failure) (HCC) 02/20/2015  . CKD (chronic kidney disease) 02/20/2015     Past Surgical History:  Procedure Laterality Date  . CARDIAC CATHETERIZATION N/A 02/02/2016   Procedure: Left Heart Cath and Coronary Angiography;  Surgeon: Lamar BlinksBruce J Kowalski, MD;  Location: [facility] INVASIVE CV LAB;  Service: Cardiovascular;  Laterality: N/A;  . CARDIAC SURGERY     3 stents in "main artery"  . JOINT REPLACEMENT       Prior to Admission medications    Medication Sig Start Date End Date Taking? Authorizing Provider  allopurinol (ZYLOPRIM) 300 MG tablet TAKE (1) TABLET BY MOUTH TWICE DAILY 04/15/17   Duanne LimerickJones, Deanna C, MD  amiodarone (PACERONE) 200 MG tablet Take 1 tablet (200 mg total) by mouth 2 (two) times daily. Patient taking differently: Take 200 mg by mouth daily. Dr Gwen PoundsKowalski 02/05/16   Alford HighlandWieting, Richard, MD  aspirin 81 MG EC tablet Take 81 mg by mouth daily.    [provider]  carvedilol (COREG) 12.5 MG tablet Take 1 tablet (12.5 mg total) by mouth 2 (two) times daily with a meal. Patient taking differently: Take 25 mg by mouth 2 (two) times daily with a meal. Dr Gwen PoundsKowalski 01/04/16   Duanne LimerickJones, Deanna C, MD  chlorhexidine (HIBICLENS) 4 % external liquid Apply topically daily as needed. 04/15/17   Duanne LimerickJones, Deanna C, MD  furosemide (LASIX) 40 MG tablet Take 1 tablet (40 mg total) by mouth 2 (two) times daily. Take 2 tabs in the morning Patient taking differently: Take 40 mg by mouth 2 (two) times daily. Take 2 tabs in the morning- Dr Gwen PoundsKowalski 02/05/16   Alford HighlandWieting, Richard, MD  hydrALAZINE (APRESOLINE) 25 MG tablet Take 1 tablet (25 mg total) by mouth 3 (three) times daily. Patient taking differently: Take 50 mg by mouth 3 (three) times daily. Dr Gwen PoundsKowalski 01/04/16   Duanne LimerickJones, Deanna C, MD  isosorbide mononitrate (IMDUR) 30 MG 24 hr tablet Take 30 mg by mouth daily. Dr Gwen PoundsKowalski  [provider]  loratadine (CLARITIN) 10 MG tablet Take 1 tablet (10 mg total) by mouth daily. 08/06/16   Duanne Limerick, MD  potassium chloride SA (K-DUR,KLOR-CON) 20 MEQ tablet Take 1 tablet (20 mEq total) by mouth 3 (three) times daily. Patient taking differently: Take 20 mEq by mouth 3 (three) times daily. Dr Gwen Pounds 08/06/16   Duanne Limerick, MD  sacubitril-valsartan (ENTRESTO) 24-26 MG Take 1 tablet by mouth 2 (two) times daily. Dr Gwen Pounds 03/31/17   [provider]  sulfamethoxazole-trimethoprim (BACTRIM DS,SEPTRA DS) 800-160 MG tablet Take 1 tablet by  mouth 2 (two) times daily. 04/15/17   Duanne Limerick, MD  timolol (TIMOPTIC) 0.5 % ophthalmic solution Place 1 drop into both eyes 2 (two) times daily. Dr Inez Pilgrim    [provider]     Allergies Niaspan [niacin er]   Family History  Problem Relation Age of Onset  . Hypertension Mother   . Pancreatic cancer Mother   . Hypertension Father   . Prostate cancer Father     Social History Social History  Substance Use Topics  . Smoking status: Former Games developer  . Smokeless tobacco: Never Used  . Alcohol use No    Review of Systems  Constitutional:   No fever or chills.  ENT:   No sore throat. No rhinorrhea. Cardiovascular:   No chest pain , positive near syncope. Respiratory:   No dyspnea or cough. Gastrointestinal:   Negative for abdominal pain, vomiting and diarrhea.  Musculoskeletal:   Negative for acute focal pain or swelling, positive chronic left leg swelling which is unchanged from baseline All other systems reviewed and are negative except as documented above in ROS and HPI.  ____________________________________________   PHYSICAL EXAM:  VITAL SIGNS: ED Triage Vitals  Enc Vitals Group     BP 04/23/17 2117 (!) 206/87     Pulse Rate 04/23/17 2117 (!) 55     Resp 04/23/17 2117 16     Temp 04/23/17 2117 97.7 F (36.5 C)     Temp Source 04/23/17 2117 Oral     SpO2 04/23/17 2117 99 %     Weight 04/23/17 2112 207 lb (93.9 kg)     Height --      Head Circumference --      Peak Flow --      Pain Score --      Pain Loc --      Pain Edu? --      Excl. in GC? --     Vital signs reviewed, nursing assessments reviewed.   Constitutional:   Alert and oriented. Not in distress Eyes:   No scleral icterus.  EOMI. No nystagmus. No conjunctival pallor. PERRL. ENT   Head:   Normocephalic and atraumatic.   Nose:   No congestion/rhinnorhea.    Mouth/Throat:   MMM, no pharyngeal erythema. No peritonsillar mass.    Neck:   No meningismus. Full  ROM. Hematological/Lymphatic/Immunilogical:   No cervical lymphadenopathy. Cardiovascular:   RRR. Symmetric bilateral radial and DP pulses.  No murmurs.  Respiratory:   Normal respiratory effort without tachypnea/retractions. Breath sounds are clear and equal bilaterally. No wheezes/rales/rhonchi. Gastrointestinal:   Soft and nontender. Non distended. There is no CVA tenderness.  No rebound, rigidity, or guarding. Genitourinary:   deferred Musculoskeletal:   Normal range of motion in all extremities. No joint effusions.  No lower extremity tenderness.  No edema. Neurologic:   Normal speech and language.  Motor grossly intact. No pronator drift  Normal finger to nose No gross focal neurologic deficits are appreciated.  Skin:    Skin is warm, dry and intact. No rash noted.  No petechiae, purpura, or bullae.  ____________________________________________    LABS (pertinent positives/negatives) (all labs ordered are listed, but only abnormal results are displayed) Labs Reviewed  BASIC METABOLIC PANEL - Abnormal; Notable for the following:       Result Value   Glucose, Bld 153 (*)    Creatinine, Ser 1.74 (*)    Calcium 8.4 (*)    GFR calc non Af Amer 37 (*)    GFR calc Af Amer 43 (*)    All other components within normal limits  CBC - Abnormal; Notable for the following:    RDW 17.2 (*)    All other components within normal limits  URINALYSIS, COMPLETE (UACMP) WITH MICROSCOPIC - Abnormal; Notable for the following:    Color, Urine STRAW (*)    APPearance CLEAR (*)    Hgb urine dipstick SMALL (*)    Protein, ur 100 (*)    Squamous Epithelial / LPF 0-5 (*)    All other components within normal limits  TROPONIN I  CBG MONITORING, ED   ____________________________________________   EKG  Interpreted by me Atrial fibrillation, rate of 54, normal axis. Poor R-wave progression in anterior leads, left bundle branch block. No acute ischemic  changes.  ____________________________________________    RADIOLOGY  Ct Head Wo Contrast  Result Date: 04/23/2017 CLINICAL DATA:  Dizziness and vomiting while ambulating to restroom. Feeling unwell today. Suspect intracranial hemorrhage. EXAM: CT HEAD WITHOUT CONTRAST TECHNIQUE: Contiguous axial images were obtained from the base of the skull through the vertex without intravenous contrast. COMPARISON:  CT HEAD March 13, 2011 FINDINGS: BRAIN: No intraparenchymal hemorrhage, mass effect nor midline shift. The ventricles and sulci are normal for age. Patchy to confluent supratentorial white matter hypodensities. 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 No acute large vascular territory infarcts. No abnormal extra-axial fluid collections. Basal cisterns are patent. VASCULAR: Moderate calcific atherosclerosis of the carotid siphons. SKULL: No skull fracture. No significant scalp soft tissue swelling. 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 Dehiscent RIGHT jugular bulb. The included ocular globes and orbital contents are non-suspicious. Status post bilateral ocular lens implants. OTHER: None. IMPRESSION: 1. No acute intracranial process. 2. Moderate to severe chronic small vessel ischemic disease. 3. 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 Electronically Signed   By: Awilda Metro M.D.   On: 04/23/2017 23:25    ____________________________________________   PROCEDURES Procedures  ____________________________________________   DIFFERENTIAL DIAGNOSIS  Symptomatic bradycardia, malignant hypertension, intracranial hemorrhage, non-STEMI  CLINICAL IMPRESSION / ASSESSMENT AND PLAN / ED COURSE  Pertinent labs & imaging results that were available during my care of the patient were reviewed by me and considered in my medical decision making (see chart for details).   Patient presents with near  syncope with severely uncontrolled hypertension today. Elevated blood pressure may be related to recent change in his hydralazine dosing and rebound. Concern for spontaneous intracranial hemorrhage as a complication of this with his vague headache symptom and vomiting and near syncope today. He does have a history of bradycardia and sick sinus syndrome, which raises concern that this is symptomatic bradycardia. We'll obtain troponin, CT head, plan to hospitalize overnight for telemetry monitoring and further cardiology evaluation.  Clinical Course as of Apr 24 2351  Wed Apr 23, 2017  2159 Baseline ckd Creatinine: (!) 1.74 [PS]  2334 NAD. No hemorrhage CT Head Wo Contrast [PS]    Clinical Course User Index [PS] Sharman Cheek, MD     ----------------------------------------- 11:51 PM on 04/23/2017 -----------------------------------------  Workup so far without acute findings. Case discussed with the hospitalist for further evaluation.  ____________________________________________   FINAL CLINICAL IMPRESSION(S) / ED DIAGNOSES    Final diagnoses:  Near syncope  Symptomatic bradycardia  Generalized weakness      New Prescriptions   No medications on file     Portions of this note were generated with dragon dictation software. Dictation errors may occur despite best attempts at proofreading.    Sharman Cheek, MD 04/23/17 2352

## 2017-04-23 NOTE — ED Triage Notes (Signed)
Pt arrived to ED via EMS from home where EMS reports pt developed dizziness and 1 episode of emesis while ambulating to restroom. Per pt, he has felt "Bad" all day and has been dizzy. Pts wife sts pt has had a decrease in oral intake in the last 2 weeks. Pt has HX of DM, Stage III kidney disease and has DNR. Pt is A&O x4 on arrival to ED.

## 2017-04-24 ENCOUNTER — Encounter: Payer: Self-pay | Admitting: Internal Medicine

## 2017-04-24 DIAGNOSIS — R197 Diarrhea, unspecified: Secondary | ICD-10-CM | POA: Diagnosis not present

## 2017-04-24 DIAGNOSIS — R42 Dizziness and giddiness: Secondary | ICD-10-CM | POA: Diagnosis not present

## 2017-04-24 DIAGNOSIS — R001 Bradycardia, unspecified: Secondary | ICD-10-CM | POA: Diagnosis present

## 2017-04-24 DIAGNOSIS — H8149 Vertigo of central origin, unspecified ear: Secondary | ICD-10-CM | POA: Diagnosis not present

## 2017-04-24 DIAGNOSIS — R55 Syncope and collapse: Secondary | ICD-10-CM | POA: Diagnosis present

## 2017-04-24 DIAGNOSIS — I509 Heart failure, unspecified: Secondary | ICD-10-CM | POA: Diagnosis not present

## 2017-04-24 DIAGNOSIS — I10 Essential (primary) hypertension: Secondary | ICD-10-CM | POA: Diagnosis not present

## 2017-04-24 LAB — HEMOGLOBIN A1C
Hgb A1c MFr Bld: 5.6 % (ref 4.8–5.6)
MEAN PLASMA GLUCOSE: 114.02 mg/dL

## 2017-04-24 LAB — MAGNESIUM: MAGNESIUM: 2.2 mg/dL (ref 1.7–2.4)

## 2017-04-24 LAB — BASIC METABOLIC PANEL
Anion gap: 8 (ref 5–15)
BUN: 17 mg/dL (ref 6–20)
CHLORIDE: 104 mmol/L (ref 101–111)
CO2: 24 mmol/L (ref 22–32)
CREATININE: 1.94 mg/dL — AB (ref 0.61–1.24)
Calcium: 8.8 mg/dL — ABNORMAL LOW (ref 8.9–10.3)
GFR calc non Af Amer: 32 mL/min — ABNORMAL LOW (ref >60)
GFR, EST AFRICAN AMERICAN: 37 mL/min — AB (ref >60)
Glucose, Bld: 122 mg/dL — ABNORMAL HIGH (ref 65–99)
POTASSIUM: 4.1 mmol/L (ref 3.5–5.1)
Sodium: 136 mmol/L (ref 135–145)

## 2017-04-24 LAB — TROPONIN I
Troponin I: <0.03 ng/mL (ref <0.03)
Troponin I: <0.03 ng/mL (ref <0.03)
Troponin I: <0.03 ng/mL (ref <0.03)

## 2017-04-24 LAB — GLUCOSE, CAPILLARY
GLUCOSE-CAPILLARY: 110 mg/dL — AB (ref 65–99)
Glucose-Capillary: 136 mg/dL — ABNORMAL HIGH (ref 65–99)
Glucose-Capillary: 129 mg/dL — ABNORMAL HIGH (ref 65–99)

## 2017-04-24 LAB — T4, FREE: FREE T4: 1.73 ng/dL — AB (ref 0.61–1.12)

## 2017-04-24 LAB — CBC
HEMATOCRIT: 41.2 % (ref 40.0–52.0)
HEMOGLOBIN: 13.4 g/dL (ref 13.0–18.0)
MCH: 29.6 pg (ref 26.0–34.0)
MCHC: 32.4 g/dL (ref 32.0–36.0)
MCV: 91.2 fL (ref 80.0–100.0)
Platelets: 222 10*3/uL (ref 150–440)
RBC: 4.52 MIL/uL (ref 4.40–5.90)
RDW: 17 % — ABNORMAL HIGH (ref 11.5–14.5)
WBC: 6.8 10*3/uL (ref 3.8–10.6)

## 2017-04-24 LAB — TSH: TSH: 0.036 u[IU]/mL — AB (ref 0.350–4.500)

## 2017-04-24 MED ORDER — POTASSIUM CHLORIDE CRYS ER 20 MEQ PO TBCR
20.0000 meq | EXTENDED_RELEASE_TABLET | Freq: Three times a day (TID) | ORAL | Status: DC
Start: 2017-04-24 — End: 2017-04-25
  Administered 2017-04-24 – 2017-04-25 (×4): 20 meq via ORAL
  Filled 2017-04-24 (×4): qty 1

## 2017-04-24 MED ORDER — FUROSEMIDE 40 MG PO TABS
40.0000 mg | ORAL_TABLET | Freq: Two times a day (BID) | ORAL | Status: DC
  Administered 2017-04-24 – 2017-04-25 (×3): 40 mg via ORAL
  Filled 2017-04-24 (×3): qty 1

## 2017-04-24 MED ORDER — TIMOLOL MALEATE 0.5 % OP SOLN
1.0000 [drp] | Freq: Two times a day (BID) | OPHTHALMIC | Status: DC
  Administered 2017-04-24 – 2017-04-25 (×3): 1 [drp] via OPHTHALMIC
  Filled 2017-04-24: qty 5

## 2017-04-24 MED ORDER — ENOXAPARIN SODIUM 40 MG/0.4ML ~~LOC~~ SOLN: 40.0000 mg | SUBCUTANEOUS | Status: DC

## 2017-04-24 MED ORDER — ONDANSETRON HCL 4 MG/2ML IJ SOLN: 4.0000 mg | Freq: Four times a day (QID) | INTRAMUSCULAR | Status: DC | PRN

## 2017-04-24 MED ORDER — HYDROCODONE-ACETAMINOPHEN 5-325 MG PO TABS: 1.0000 | ORAL_TABLET | ORAL | Status: DC | PRN

## 2017-04-24 MED ORDER — ALLOPURINOL 300 MG PO TABS
300.0000 mg | ORAL_TABLET | Freq: Two times a day (BID) | ORAL | Status: DC
  Administered 2017-04-24 – 2017-04-25 (×3): 300 mg via ORAL
  Filled 2017-04-24 (×5): qty 1

## 2017-04-24 MED ORDER — SODIUM CHLORIDE 0.9% FLUSH: 3.0000 mL | Freq: Two times a day (BID) | INTRAVENOUS | Status: DC

## 2017-04-24 MED ORDER — SODIUM CHLORIDE 0.9% FLUSH: 3.0000 mL | INTRAVENOUS | Status: DC | PRN

## 2017-04-24 MED ORDER — SODIUM CHLORIDE 0.9% FLUSH
3.0000 mL | Freq: Two times a day (BID) | INTRAVENOUS | Status: DC
  Administered 2017-04-24 – 2017-04-25 (×3): 3 mL via INTRAVENOUS

## 2017-04-24 MED ORDER — HYDRALAZINE HCL 50 MG PO TABS
25.0000 mg | ORAL_TABLET | Freq: Three times a day (TID) | ORAL | Status: DC
  Administered 2017-04-24 – 2017-04-25 (×4): 25 mg via ORAL
  Filled 2017-04-24 (×4): qty 1

## 2017-04-24 MED ORDER — SENNOSIDES-DOCUSATE SODIUM 8.6-50 MG PO TABS: 1.0000 | ORAL_TABLET | Freq: Every evening | ORAL | Status: DC | PRN

## 2017-04-24 MED ORDER — ANASTROZOLE 1 MG PO TABS
1.0000 mg | ORAL_TABLET | Freq: Every day | ORAL | Status: DC
  Administered 2017-04-24 – 2017-04-25 (×2): 1 mg via ORAL
  Filled 2017-04-24 (×2): qty 1

## 2017-04-24 MED ORDER — HYDRALAZINE HCL 20 MG/ML IJ SOLN: 10.0000 mg | Freq: Four times a day (QID) | INTRAMUSCULAR | Status: DC | PRN

## 2017-04-24 MED ORDER — CARVEDILOL 25 MG PO TABS: 25.0000 mg | ORAL_TABLET | Freq: Two times a day (BID) | ORAL | Status: DC

## 2017-04-24 MED ORDER — SACUBITRIL-VALSARTAN 24-26 MG PO TABS
1.0000 | ORAL_TABLET | Freq: Two times a day (BID) | ORAL | Status: DC
  Administered 2017-04-24 – 2017-04-25 (×4): 1 via ORAL
  Filled 2017-04-24 (×4): qty 1

## 2017-04-24 MED ORDER — ISOSORBIDE MONONITRATE ER 60 MG PO TB24
30.0000 mg | ORAL_TABLET | Freq: Every day | ORAL | Status: DC
  Administered 2017-04-24 – 2017-04-25 (×2): 30 mg via ORAL
  Filled 2017-04-24 (×2): qty 1

## 2017-04-24 MED ORDER — CARVEDILOL 25 MG PO TABS
25.0000 mg | ORAL_TABLET | Freq: Two times a day (BID) | ORAL | Status: DC
  Administered 2017-04-24 – 2017-04-25 (×3): 25 mg via ORAL
  Filled 2017-04-24 (×3): qty 1

## 2017-04-24 MED ORDER — ONDANSETRON HCL 4 MG PO TABS: 4.0000 mg | ORAL_TABLET | Freq: Four times a day (QID) | ORAL | Status: DC | PRN

## 2017-04-24 MED ORDER — SULFAMETHOXAZOLE-TRIMETHOPRIM 800-160 MG PO TABS
1.0000 | ORAL_TABLET | Freq: Two times a day (BID) | ORAL | Status: DC
  Administered 2017-04-24 – 2017-04-25 (×3): 1 via ORAL
  Filled 2017-04-24 (×4): qty 1

## 2017-04-24 MED ORDER — HEPARIN SODIUM (PORCINE) 5000 UNIT/ML IJ SOLN
5000.0000 [IU] | Freq: Three times a day (TID) | INTRAMUSCULAR | Status: DC
  Administered 2017-04-24 – 2017-04-25 (×4): 5000 [IU] via SUBCUTANEOUS
  Filled 2017-04-24 (×4): qty 1

## 2017-04-24 MED ORDER — SODIUM CHLORIDE 0.9 % IV SOLN
250.0000 mL | INTRAVENOUS | Status: DC | PRN
Start: 2017-04-24 — End: 2017-04-25

## 2017-04-24 MED ORDER — ACETAMINOPHEN 325 MG PO TABS: 650.0000 mg | ORAL_TABLET | Freq: Four times a day (QID) | ORAL | Status: DC | PRN

## 2017-04-24 MED ORDER — FUROSEMIDE 40 MG PO TABS: 40.0000 mg | ORAL_TABLET | Freq: Two times a day (BID) | ORAL | Status: DC

## 2017-04-24 MED ORDER — ACETAMINOPHEN 650 MG RE SUPP: 650.0000 mg | Freq: Four times a day (QID) | RECTAL | Status: DC | PRN

## 2017-04-24 MED ORDER — HYDRALAZINE HCL 50 MG PO TABS: 50.0000 mg | ORAL_TABLET | Freq: Three times a day (TID) | ORAL | Status: DC

## 2017-04-24 MED ORDER — AMIODARONE HCL 200 MG PO TABS
200.0000 mg | ORAL_TABLET | Freq: Every day | ORAL | Status: DC
  Administered 2017-04-24 – 2017-04-25 (×2): 200 mg via ORAL
  Filled 2017-04-24 (×2): qty 1

## 2017-04-24 MED ORDER — SODIUM CHLORIDE 0.9 % IV SOLN: 250.0000 mL | INTRAVENOUS | Status: DC | PRN

## 2017-04-24 NOTE — Progress Notes (Signed)
Pt states his head feels "funny" can not describe what "funny" feels like, will monitor.

## 2017-04-24 NOTE — Progress Notes (Signed)
[employer] [facility] [place] [facility]                                                                                                                                                                                  Patient Demographics   Larry Lawrence, is a 76 y.o. male, DOB - February 09, 1941, ZOX:[id number]  Admit date - 04/23/2017   Admitting Physician Bertrum Sol, MD  Outpatient Primary MD for the patient is Duanne Limerick, MD   LOS - 0  Subjective: Patient admitted with dizziness recently blood pressure was low and now elevated during hospitalization Feeling better   Review of Systems:   CONSTITUTIONAL: No documented fever. No fatigue, weakness. No weight gain, no weight loss.  EYES: No blurry or double vision.  ENT: No tinnitus. No postnasal drip. No redness of the oropharynx.  RESPIRATORY: No cough, no wheeze, no hemoptysis. No dyspnea.  CARDIOVASCULAR: No chest pain. No orthopnea. No palpitations. No syncope.  GASTROINTESTINAL: No nausea, no vomiting or diarrhea. No abdominal pain. No melena or hematochezia.  GENITOURINARY: No dysuria or hematuria.  ENDOCRINE: No polyuria or nocturia. No heat or cold intolerance.  HEMATOLOGY: No anemia. No bruising. No bleeding.  INTEGUMENTARY: No rashes. No lesions.  MUSCULOSKELETAL: No arthritis. No swelling. No gout.  NEUROLOGIC: No numbness, tingling, or ataxia. No seizure-type activity. Positive dizziness PSYCHIATRIC: No anxiety. No insomnia. No ADD.    Vitals:   Vitals:   04/24/17 0319 04/24/17 0619 04/24/17 0822 04/24/17 1436  BP: (!) 187/81 (!) 142/74 (!) 173/80 (!) 142/69  Pulse: (!) 57 (!) 53 (!) 57 (!) 52  Resp:   14   Temp: 97.7 F (36.5 C)  98.2 F (36.8 C)   TempSrc: Oral  Oral   SpO2: 99%  97%   Weight: 200 lb 3.2 oz (90.8 kg)     Height: 5\' 11"  (1.803 m)       Wt Readings from Last 3 Encounters:  04/24/17 200 lb 3.2 oz (90.8 kg)  04/15/17 207 lb (93.9 kg)  02/03/17 221 lb (100.2 kg)      Intake/Output Summary (Last 24 hours) at 04/24/17 1534 Last data filed at 04/24/17 1446  Gross per 24 hour  Intake              243 ml  Output              350 ml  Net             -107 ml    Physical Exam:   GENERAL: Pleasant-appearing in no apparent distress.  HEAD, EYES, EARS, NOSE AND THROAT: Atraumatic, normocephalic. Extraocular muscles are intact. Pupils equal and  reactive to light. Sclerae anicteric. No conjunctival injection. No oro-pharyngeal erythema.  NECK: Supple. There is no jugular venous distention. No bruits, no lymphadenopathy, no thyromegaly.  HEART: Regular rate and rhythm,. No murmurs, no rubs, no clicks.  LUNGS: Clear to auscultation bilaterally. No rales or rhonchi. No wheezes.  ABDOMEN: Soft, flat, nontender, nondistended. Has good bowel sounds. No hepatosplenomegaly appreciated.  EXTREMITIES: No evidence of any cyanosis, clubbing, or peripheral edema.  +2 pedal and radial pulses bilaterally.  NEUROLOGIC: The patient is alert, awake, and oriented x3 with no focal motor or sensory deficits appreciated bilaterally.  SKIN: Moist and warm with no rashes appreciated.  Psych: Not anxious, depressed LN: No inguinal LN enlargement    Antibiotics   Anti-infectives    Start     Dose/Rate Route Frequency Ordered Stop   04/24/17 1015  sulfamethoxazole-trimethoprim (BACTRIM DS,SEPTRA DS) 800-160 MG per tablet 1 tablet     1 tablet Oral 2 times daily 04/24/17 1010        Medications   Scheduled Meds: . allopurinol  300 mg Oral BID  . amiodarone  200 mg Oral Daily  . anastrozole  1 mg Oral Daily  . carvedilol  25 mg Oral BID WC  . furosemide  40 mg Oral BID  . heparin  5,000 Units Subcutaneous Q8H  . hydrALAZINE  25 mg Oral Q8H  . isosorbide mononitrate  30 mg Oral Daily  . potassium chloride SA  20 mEq Oral TID  . sacubitril-valsartan  1 tablet Oral BID  . sodium chloride flush  3 mL Intravenous Q12H  . sodium chloride flush  3 mL Intravenous Q12H  .  sodium chloride flush  3 mL Intravenous Q12H  . sulfamethoxazole-trimethoprim  1 tablet Oral BID  . timolol  1 drop Both Eyes BID   Continuous Infusions: . sodium chloride    . sodium chloride     PRN Meds:.sodium chloride, sodium chloride, acetaminophen **OR** acetaminophen, hydrALAZINE, HYDROcodone-acetaminophen, ondansetron **OR** ondansetron (ZOFRAN) IV, senna-docusate, sodium chloride flush, sodium chloride flush   Data Review:   Micro Results No results found for this or any previous visit (from the past 240 hour(s)).  Radiology Reports Ct Head Wo Contrast  Result Date: 04/23/2017 CLINICAL DATA:  Dizziness and vomiting while ambulating to restroom. Feeling unwell today. Suspect intracranial hemorrhage. EXAM: CT HEAD WITHOUT CONTRAST TECHNIQUE: Contiguous axial images were obtained from the base of the skull through the vertex without intravenous contrast. COMPARISON:  CT HEAD March 13, 2011 FINDINGS: BRAIN: No intraparenchymal hemorrhage, mass effect nor midline shift. The ventricles and sulci are normal for age. Patchy to confluent supratentorial white matter hypodensities. 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 No acute large vascular territory infarcts. No abnormal extra-axial fluid collections. Basal cisterns are patent. VASCULAR: Moderate calcific atherosclerosis of the carotid siphons. SKULL: No skull fracture. No significant scalp soft tissue swelling. 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 Dehiscent RIGHT jugular bulb. The included ocular globes and orbital contents are non-suspicious. Status post bilateral ocular lens implants. OTHER: None. IMPRESSION: 1. No acute intracranial process. 2. Moderate to severe chronic small vessel ischemic disease. 3. 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 Electronically Signed   By: Awilda Metroourtnay  Bloomer M.D.   On: 04/23/2017 23:25      CBC  Recent Labs Lab 04/23/17 2111 04/24/17 0320  WBC 7.8 6.8  HGB 13.6 13.4  HCT 42.1 41.2  PLT 212 222  MCV 91.1 91.2  MCH 29.5 29.6  MCHC 32.4 32.4  RDW 17.2* 17.0*    Chemistries   Recent Labs Lab 04/23/17 2111 04/24/17 0320 04/24/17 0349  NA 137 136  --   K 3.7 4.1  --   CL 107 104  --   CO2 22 24  --   GLUCOSE 153* 122*  --   BUN 16 17  --   CREATININE 1.74* 1.94*  --   CALCIUM 8.4* 8.8*  --   MG  --   --  2.2   ------------------------------------------------------------------------------------------------------------------ estimated creatinine clearance is 38.5 mL/min (A) (by C-G formula based on SCr of 1.94 mg/dL (H)). ------------------------------------------------------------------------------------------------------------------  Recent Labs  04/24/17 0349  HGBA1C 5.6   ------------------------------------------------------------------------------------------------------------------ No results for input(s): CHOL, HDL, LDLCALC, TRIG, CHOLHDL, LDLDIRECT in the last 72 hours. ------------------------------------------------------------------------------------------------------------------  Recent Labs  04/24/17 0349  TSH 0.036*   ------------------------------------------------------------------------------------------------------------------ No results for input(s): VITAMINB12, FOLATE, FERRITIN, TIBC, IRON, RETICCTPCT in the last 72 hours.  Coagulation profile No results for input(s): INR, PROTIME in the last 168 hours.  No results for input(s): DDIMER in the last 72 hours.  Cardiac Enzymes  Recent Labs Lab 04/24/17 0320 04/24/17 0905 04/24/17 1356  TROPONINI <0.03 <0.03 <0.03   ------------------------------------------------------------------------------------------------------------------ Invalid input(s): POCBNP    Assessment & Plan  Patient is a 76 year old presented with dizziness  1. 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 Exact etiology unknown Unlikely infectious due to duration needs outpatient GI follow-up  3 chronic benign essential hypertension Relatively stable Continue Entresto, resume his antihypertensive due to blood pressure medications being elevated  4 chronic systolic congestive heart failure without exacerbation, stage III Currently compensated will resume his home medications   DNR status per patient wishes Condition stable Prognosis fair DVT prophylaxis with heparin subcu Disposition to home in the morning pending cardiology evaluation        Code Status Orders        Start     Ordered   04/24/17 0338  Do not attempt resuscitation (DNR)  Continuous    Question Answer Comment  In the event of cardiac or respiratory ARREST Do not call a "code blue"   In the event of cardiac or respiratory ARREST Do not perform Intubation, CPR, defibrillation or ACLS   In the event of cardiac or respiratory ARREST Use medication by any route, position, wound care, and other measures to relive pain and suffering. May use oxygen, suction and manual treatment of airway obstruction as needed for comfort.      04/24/17 1610    Code Status History    Date Active Date Inactive Code Status Order ID Comments User Context   04/24/2017  2:49 AM 04/24/2017  3:37 AM DNR [id number]  Ihor Austin, MD ED   02/01/2016  9:49 PM 02/05/2016  4:46 PM DNR [id number]  Oralia Manis, MD ED   08/09/2015  4:44 PM 08/11/2015  4:54 PM DNR [id number]  Wyatt Haste, MD ED   08/09/2015  4:43 PM 08/09/2015  4:44 PM Full Code [id number]  Wyatt Haste, MD ED   02/20/2015  9:37 AM 02/21/2015  7:16 PM DNR [id number]  Crissie Figures, MD Inpatient    Advance Directive Documentation     Most Recent Value  Type of Advance Directive  Living will,  Healthcare Power of [place]  Pre-existing out of facility DNR order (yellow form or pink MOST form)  -  "MOST" Form in Place?  -           Consults cardiology   DVT Prophylaxis  Lovenox Lab Results  Component Value Date   PLT 222 04/24/2017     Time Spent in minutes    Greater than 50% of time spent in care coordination and counseling patient regarding the condition and plan of care.   Auburn Bilberry M.D on 04/24/2017 at 3:34 PM  Between 7am to 6pm - Pager - [phone]  After 6pm go to [email or web address] - password [email or web address] [facility]  [facility] [place] [facility]   Office  [phone]

## 2017-04-24 NOTE — H&P (Signed)
[facility] [place] [facility]   PATIENT NAME: Larry Lawrence    MR#:  [id number]  DATE OF BIRTH:  1941-04-08  DATE OF ADMISSION:  04/23/2017  PRIMARY CARE PHYSICIAN: Duanne Limerick, MD   REQUESTING/REFERRING PHYSICIAN:   CHIEF COMPLAINT:   Chief Complaint  Patient presents with  . Dizziness    HISTORY OF PRESENT ILLNESS: Larry Lawrence  is a 76 y.o. male with below past medical history which includes sick sinus syndrome, chronic bradycardia, coronary artery disease status post coronary artery stenting x3, seen by his cardiologist on yesterday-noted to have hypotension with systolic blood pressure in the 90s for which hydralazine was reduced from 50-25 mg 3 times daily, the patient presents to the emergency room on today with 2-week history of generalized weakness/fatigue, acute lightheadedness/dizziness that occurred on today, had near syncopal episode with urination at home, one episode of emesis, decreased p.o. intake for several weeks per wife, has been feeling ill, head feeling funny, complaints of chills, dyspnea on exertion, in the emergency room patient was found to be bradycardic with heart rate in the 50s, initial blood pressure was 206/87-on repeat blood pressure was in the 150s-140s range systolically, creatinine was 1.7 near baseline, CT head was unimpressive/no acute process, EKG with heart rate of 54/stable left bundle branch block, urinalysis unimpressive, CBC unimpressive, hospitalist service subsequent contacted for further evaluation/care.  Patient evaluated in the emergency room, wife at the bedside, patient in no apparent distress, per patient and the patient's wife patient has had chronic watery diarrhea for 2-years without known etiology, patient has refused colonoscopies in the past as well as now, patient is now being admitted for acute on chronic sinus bradycardia.  PAST MEDICAL HISTORY:   Past Medical History:  Diagnosis Date  . Allergy   . CHF  (congestive heart failure) (HCC)   . Coronary artery disease   . Gout   . Insomnia   . Renal disorder   . Sleeps in sitting position due to orthopnea   . TIA (transient ischemic attack)     PAST SURGICAL HISTORY: Past Surgical History:  Procedure Laterality Date  . CARDIAC CATHETERIZATION N/A 02/02/2016   Procedure: Left Heart Cath and Coronary Angiography;  Surgeon: Lamar Blinks, MD;  Location: [facility] INVASIVE CV LAB;  Service: Cardiovascular;  Laterality: N/A;  . CARDIAC SURGERY     3 stents in "main artery"  . JOINT REPLACEMENT      SOCIAL HISTORY:  Social History  Substance Use Topics  . Smoking status: Former Games developer  . Smokeless tobacco: Never Used  . Alcohol use No    FAMILY HISTORY:  Family History  Problem Relation Age of Onset  . Hypertension Mother   . Pancreatic cancer Mother   . Hypertension Father   . Prostate cancer Father     DRUG ALLERGIES:  Allergies  Allergen Reactions  . Niaspan [Niacin Er] Other (See Comments)    Reaction:  Hot flashes     REVIEW OF SYSTEMS:   CONSTITUTIONAL: Generalized weakness, fatigue, chronic chills  EYES: No blurred or double vision.  EARS, NOSE, AND THROAT: No tinnitus or ear pain.  RESPIRATORY: No cough, + shortness of breath, no wheezing or hemoptysis.  CARDIOVASCULAR: No chest pain, orthopnea, edema.  GASTROINTESTINAL: No nausea, vomiting, + chronic diarrhea for 2 years, no  abdominal pain.  GENITOURINARY: No dysuria, hematuria.  ENDOCRINE: No polyuria, nocturia,  HEMATOLOGY: No anemia, easy bruising or bleeding SKIN: No rash or lesion. MUSCULOSKELETAL: Generalized  weakness, fatigue no joint pain or arthritis.   NEUROLOGIC: No tingling, numbness, weakness.  PSYCHIATRY: No anxiety or depression.   MEDICATIONS AT HOME:  Prior to Admission medications   Medication Sig Start Date End Date Taking? Authorizing Provider  allopurinol (ZYLOPRIM) 300 MG tablet TAKE (1) TABLET BY MOUTH TWICE DAILY 04/15/17  Yes Duanne Limerick, MD  amiodarone (PACERONE) 200 MG tablet Take 200 mg by mouth daily.   Yes [provider]  anastrozole (ARIMIDEX) 1 MG tablet Take 1 mg by mouth daily.   Yes [provider]  carvedilol (COREG) 25 MG tablet Take 25 mg by mouth 2 (two) times daily with a meal.   Yes [provider]  chlorhexidine (HIBICLENS) 4 % external liquid Apply topically daily as needed. Patient taking differently: Apply 1 application topically daily as needed (wound care).  04/15/17  Yes Duanne Limerick, MD  furosemide (LASIX) 40 MG tablet Take 1 tablet (40 mg total) by mouth 2 (two) times daily. Take 2 tabs in the morning 02/05/16  Yes Wieting, Richard, MD  hydrALAZINE (APRESOLINE) 50 MG tablet Take 50 mg by mouth 3 (three) times daily. 04/11/17  Yes [provider]  isosorbide mononitrate (IMDUR) 30 MG 24 hr tablet Take 30 mg by mouth daily.    Yes [provider]  loratadine (CLARITIN) 10 MG tablet Take 1 tablet (10 mg total) by mouth daily. Patient taking differently: Take 10 mg by mouth daily as needed for allergies.  08/06/16  Yes Duanne Limerick, MD  potassium chloride SA (K-DUR,KLOR-CON) 20 MEQ tablet Take 1 tablet (20 mEq total) by mouth 3 (three) times daily. 08/06/16  Yes Duanne Limerick, MD  sacubitril-valsartan (ENTRESTO) 24-26 MG Take 1 tablet by mouth 2 (two) times daily.  03/31/17  Yes [provider]  sulfamethoxazole-trimethoprim (BACTRIM DS,SEPTRA DS) 800-160 MG tablet Take 1 tablet by mouth 2 (two) times daily. 04/15/17  Yes Duanne Limerick, MD  timolol (TIMOPTIC) 0.5 % ophthalmic solution Place 1 drop into both eyes 2 (two) times daily. Dr Inez Pilgrim   Yes [provider]      PHYSICAL EXAMINATION:   VITAL SIGNS: Blood pressure (!) 152/81, pulse (!) 53, temperature 97.7 F (36.5 C), temperature source Oral, resp. rate 14, weight 93.9 kg (207 lb), SpO2 97 %.  GENERAL:  76 y.o.-year-old patient lying in the bed with no acute distress.   Frail-appearing EYES: Pupils equal, round, reactive to light and accommodation. No scleral icterus. Extraocular muscles intact.  HEENT: Head atraumatic, normocephalic. Oropharynx and nasopharynx clear.  NECK:  Supple, no jugular venous distention. No thyroid enlargement, no tenderness.  LUNGS: Normal breath sounds bilaterally, no wheezing, rales,rhonchi or crepitation. No use of accessory muscles of respiration.  CARDIOVASCULAR: S1, S2 normal. No murmurs, rubs, or gallops.  ABDOMEN: Soft, nontender, nondistended. Bowel sounds present. No organomegaly or mass.  EXTREMITIES: No pedal edema, cyanosis, or clubbing.  NEUROLOGIC: Cranial nerves II through XII are intact. Muscle strength 5/5 in all extremities. Sensation intact. Gait not checked.  PSYCHIATRIC: The patient is alert and oriented x 3.  SKIN: No obvious rash, lesion, or ulcer.   LABORATORY PANEL:   CBC  Recent Labs Lab 04/23/17 2111  WBC 7.8  HGB 13.6  HCT 42.1  PLT 212  MCV 91.1  MCH 29.5  MCHC 32.4  RDW 17.2*   ------------------------------------------------------------------------------------------------------------------  Chemistries   Recent Labs Lab 04/23/17 2111  NA 137  K 3.7  CL 107  CO2 22  GLUCOSE  153*  BUN 16  CREATININE 1.74*  CALCIUM 8.4*   ------------------------------------------------------------------------------------------------------------------ estimated creatinine clearance is 43.6 mL/min (A) (by C-G formula based on SCr of 1.74 mg/dL (H)). ------------------------------------------------------------------------------------------------------------------ No results for input(s): TSH, T4TOTAL, T3FREE, THYROIDAB in the last 72 hours.  Invalid input(s): FREET3   Coagulation profile No results for input(s): INR, PROTIME in the last 168 hours. ------------------------------------------------------------------------------------------------------------------- No results for input(s):  DDIMER in the last 72 hours. -------------------------------------------------------------------------------------------------------------------  Cardiac Enzymes  Recent Labs Lab 04/23/17 2111  TROPONINI <0.03   ------------------------------------------------------------------------------------------------------------------ Invalid input(s): POCBNP  ---------------------------------------------------------------------------------------------------------------  Urinalysis    Component Value Date/Time   COLORURINE STRAW (A) 04/23/2017 2111   APPEARANCEUR CLEAR (A) 04/23/2017 2111   LABSPEC 1.006 04/23/2017 2111   PHURINE 7.0 04/23/2017 2111   GLUCOSEU NEGATIVE 04/23/2017 2111   HGBUR SMALL (A) 04/23/2017 2111   BILIRUBINUR NEGATIVE 04/23/2017 2111   KETONESUR NEGATIVE 04/23/2017 2111   PROTEINUR 100 (A) 04/23/2017 2111   NITRITE NEGATIVE 04/23/2017 2111   LEUKOCYTESUR NEGATIVE 04/23/2017 2111     RADIOLOGY: Ct Head Wo Contrast  Result Date: 04/23/2017 CLINICAL DATA:  Dizziness and vomiting while ambulating to restroom. Feeling unwell today. Suspect intracranial hemorrhage. EXAM: CT HEAD WITHOUT CONTRAST TECHNIQUE: Contiguous axial images were obtained from the base of the skull through the vertex without intravenous contrast. COMPARISON:  CT HEAD March 13, 2011 FINDINGS: BRAIN: No intraparenchymal hemorrhage, mass effect nor midline shift. The ventricles and sulci are normal for age. Patchy to confluent supratentorial white matter hypodensities. 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 No acute large vascular territory infarcts. No abnormal extra-axial fluid collections. Basal cisterns are patent. VASCULAR: Moderate calcific atherosclerosis of the carotid siphons. SKULL: No skull fracture. No significant scalp soft tissue swelling. 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 Dehiscent RIGHT jugular  bulb. The included ocular globes and orbital contents are non-suspicious. Status post bilateral ocular lens implants. OTHER: None. IMPRESSION: 1. No acute intracranial process. 2. Moderate to severe chronic small vessel ischemic disease. 3. 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 Electronically Signed   By: Awilda Metroourtnay  Bloomer M.D.   On: 04/23/2017 23:25    EKG: Orders placed or performed during the hospital encounter of 04/23/17  . ED EKG  . ED EKG  . EKG 12-Lead  . EKG 12-Lead    IMPRESSION AND PLAN: 1 acute on chronic bradycardia Noted history of sick sinus syndrome Referred to the observation unit, consult Dr. Smitty CordsBruce Kowalski/cardiology for expert opinion, echocardiogram, rule out acute coronary syndrome with cardiac enzymes x3 sets, avoid AV nodal blocking agents, hold beta-blocker therapy, check orthostatics, physical therapy to evaluate/treat, continue close medical monitoring  2 chronic diarrhea of 2 years duration Exact etiology unknown Check stool studies, C. difficile further evaluation  3 chronic benign essential hypertension Relatively stable Continue Entresto, hold Coreg/Lasix/hydralazine for now, vitals per routine, make changes as per necessary  4 chronic systolic congestive heart failure without exacerbation, stage III Hold beta-blocker therapy/Lasix as stated above, strict I&O monitoring, daily weights, continue Entresto, low sodium/cardiac diet  DNR status per patient wishes Condition stable Prognosis fair DVT prophylaxis with heparin subcu Disposition to home in the morning pending cardiology evaluation      All the records are reviewed and case discussed with ED provider. Management plans discussed with the patient, family and they are in agreement.  CODE STATUS:    Code Status Orders        Start     Ordered  04/24/17 0250  Do not attempt resuscitation (DNR)  Continuous    Question Answer Comment  In the event of cardiac or  respiratory ARREST Do not call a "code blue"   In the event of cardiac or respiratory ARREST Do not perform Intubation, CPR, defibrillation or ACLS   In the event of cardiac or respiratory ARREST Use medication by any route, position, wound care, and other measures to relive pain and suffering. May use oxygen, suction and manual treatment of airway obstruction as needed for comfort.      04/24/17 0249    Code Status History    Date Active Date Inactive Code Status Order ID Comments User Context   02/01/2016  9:49 PM 02/05/2016  4:46 PM DNR [id number]  Oralia Manis, MD ED   08/09/2015  4:44 PM 08/11/2015  4:54 PM DNR [id number]  Wyatt Haste, MD ED   08/09/2015  4:43 PM 08/09/2015  4:44 PM Full Code [id number]  Wyatt Haste, MD ED   02/20/2015  9:37 AM 02/21/2015  7:16 PM DNR [id number]  Crissie Figures, MD Inpatient       TOTAL TIME TAKING CARE OF THIS PATIENT: 45 minutes.    Evelena Asa Corban Kistler M.D on 04/24/2017   Between 7am to 6pm - Pager - [phone]  After 6pm go to [email or web address] - password [email or web address]  [facility]  Office  [phone]  CC: Primary care physician; Duanne Limerick, MD   Note: This dictation was prepared with Dragon dictation along with smaller phrase technology. Any transcriptional errors that result from this process are unintentional.

## 2017-04-24 NOTE — Evaluation (Signed)
Physical Therapy Evaluation Patient Details Name: Larry Lawrence MRN: [id number] DOB: [birth year] Today's Date: 04/24/2017   History of Present Illness  76 y.o. male with below past medical history which includes sick sinus syndrome, chronic bradycardia, coronary artery disease status post coronary artery stenting x3.  He has been having issues with lightheadness/syncope and general weakness for a few weeks now.    Clinical Impression  Pt is able to ambulate well with walker and was also able to walk ~25 ft with only using single hand rail w/o LOB or safety issue.  He had some fatigue with the effort but was safe and reports being near his baseline.  Of note: orthostatic BPs taken: supine 175/83, sitting 176/82, standing 170/85 all asymptomatic. Pt did well with all aspects of mobility and he and his wife feel safe about returning home w/o further PT follow up.  Will sign-off.    Follow Up Recommendations No PT follow up    Equipment Recommendations  None recommended by PT    Recommendations for Other Services       Precautions / Restrictions Precautions Precautions: Fall Restrictions Weight Bearing Restrictions: No      Mobility  Bed Mobility Overal bed mobility: Independent             General bed mobility comments: Pt able to get to EOB w/o assist   Transfers Overall transfer level: Independent Equipment used: Rolling walker (2 wheeled)             General transfer comment: Pt did well with getting to standing, no LOBs or issues  Ambulation/Gait Ambulation/Gait assistance: Min guard Ambulation Distance (Feet): 200 Feet Assistive device: Rolling walker (2 wheeled)       General Gait Details: Pt was able to maintain slow but consistent cadence with no LOBs and only minimal c/o fatigue.  Pt did not have any lightheadeness/dizziness with the effort and ultimately did well.   Stairs            Wheelchair Mobility    Modified Rankin (Stroke Patients  Only)       Balance Overall balance assessment: Modified Independent                                           Pertinent Vitals/Pain Pain Assessment: No/denies pain    Home Living Family/patient expects to be discharged to:: Private residence Living Arrangements: Spouse/significant other     Home Access: Level entry       Home Equipment: Walker - 2 wheels;Cane - single point      Prior Function Level of Independence: Independent with assistive device(s)         Comments: Pt mostly uses SPC, but occasionally uses RW     Hand Dominance        Extremity/Trunk Assessment   Upper Extremity Assessment Upper Extremity Assessment: Overall WFL for tasks assessed    Lower Extremity Assessment Lower Extremity Assessment: Overall WFL for tasks assessed       Communication   Communication: No difficulties  Cognition Arousal/Alertness: Awake/alert Behavior During Therapy: WFL for tasks assessed/performed Overall Cognitive Status: Within Functional Limits for tasks assessed                                        General  Comments      Exercises     Assessment/Plan    PT Assessment Patent does not need any further PT services  PT Problem List         PT Treatment Interventions      PT Goals (Current goals can be found in the Care Plan section)  Acute Rehab PT Goals Patient Stated Goal: go home PT Goal Formulation: All assessment and education complete, DC therapy    Frequency     Barriers to discharge        Co-evaluation               AM-PAC PT "6 Clicks" Daily Activity  Outcome Measure Difficulty turning over in bed (including adjusting bedclothes, sheets and blankets)?: None Difficulty moving from lying on back to sitting on the side of the bed? : None Difficulty sitting down on and standing up from a chair with arms (e.g., wheelchair, bedside commode, etc,.)?: None Help needed moving to and from a bed  to chair (including a wheelchair)?: None Help needed walking in hospital room?: None Help needed climbing 3-5 steps with a railing? : A Little 6 Click Score: 23    End of Session Equipment Utilized During Treatment: Gait belt Activity Tolerance: Patient tolerated treatment well Patient left: with chair alarm set;with call bell/phone within reach;with family/visitor present Nurse Communication: Mobility status PT Visit Diagnosis: Muscle weakness (generalized) (M62.81);Difficulty in walking, not elsewhere classified (R26.2)    Time: 5409-8119 PT Time Calculation (min) (ACUTE ONLY): 26 min   Charges:   PT Evaluation $PT Eval Low Complexity: 1 Low     PT G Codes:   PT G-Codes **NOT FOR INPATIENT CLASS** Functional Assessment Tool Used: AM-PAC 6 Clicks Basic Mobility Functional Limitation: Mobility: Walking and moving around Mobility: Walking and Moving Around Current Status ([id number]): At least 1 percent but less than 20 percent impaired, limited or restricted Mobility: Walking and Moving Around Goal Status [phone]): At least 1 percent but less than 20 percent impaired, limited or restricted Mobility: Walking and Moving Around Discharge Status [phone]): At least 1 percent but less than 20 percent impaired, limited or restricted    Malachi Pro, DPT 04/24/2017, 1:28 PM

## 2017-04-24 NOTE — Care Management Obs Status (Signed)
MEDICARE OBSERVATION STATUS NOTIFICATION   Patient Details  Name: Larry Lawrence MRN: [id number] Date of Birth: [birth year]   Medicare Observation Status Notification Given:  Yes Notice signed, one given to patient and the other to HIM for scanning   Eber HongGreene, Dannie Woolen R, RN 04/24/2017, 6:40 PM

## 2017-04-24 NOTE — ED Notes (Signed)
Helped patient to bathroom. 

## 2017-04-24 NOTE — Consult Note (Signed)
Reason for Consult: Bradycardia vertigo Referring Physician: Dr. Jerelyn Charles hospitalist primary physician Dr. Otilio Miu Cardiologist Dr. Su Hilt is an 76 y.o. male.  HPI: Patient's a 76 year old male with a history of sick sinus syndrome chronic bradycardia coronary disease history of PCI and stent hypertension generalized weakness occasional vertigo symptoms but presented with severe bradycardia with elevated blood pressures patient's current chronic diarrhea denies any chest pain has had chronic dyspnea on exertion. He denies any blackout spells or syncope. Is unclear to me the patient's had atrial fibrillation. Currently not on anticoagulation  Past Medical History:  Diagnosis Date  . Allergy   . CHF (congestive heart failure) ([place])   . Coronary artery disease   . Gout   . Insomnia   . Renal disorder   . Sleeps in sitting position due to orthopnea   . TIA (transient ischemic attack)     Past Surgical History:  Procedure Laterality Date  . CARDIAC CATHETERIZATION N/A 02/02/2016   Procedure: Left Heart Cath and Coronary Angiography;  Surgeon: Corey Skains, MD;  Location: [facility];  Service: Cardiovascular;  Laterality: N/A;  . CARDIAC SURGERY     3 stents in "main artery"  . JOINT REPLACEMENT      Family History  Problem Relation Age of Onset  . Hypertension Mother   . Pancreatic cancer Mother   . Hypertension Father   . Prostate cancer Father     Social History:  reports that he has quit smoking. He has never used smokeless tobacco. He reports that he does not drink alcohol or use drugs.  Allergies:  Allergies  Allergen Reactions  . Niaspan [Niacin Er] Other (See Comments)    Reaction:  Hot flashes     Medications: I have reviewed the patient's current medications.  Results for orders placed or performed during the hospital encounter of 04/23/17 (from the past 48 hour(s))  Basic metabolic panel     Status: Abnormal   Collection Time:  04/23/17  9:11 PM  Result Value Ref Range   Sodium 137 135 - 145 mmol/L   Potassium 3.7 3.5 - 5.1 mmol/L   Chloride 107 101 - 111 mmol/L   CO2 22 22 - 32 mmol/L   Glucose, Bld 153 (H) 65 - 99 mg/dL   BUN 16 6 - 20 mg/dL   Creatinine, Ser 1.74 (H) 0.61 - 1.24 mg/dL   Calcium 8.4 (L) 8.9 - 10.3 mg/dL   GFR calc non Af Amer 37 (L) >60 mL/min   GFR calc Af Amer 43 (L) >60 mL/min    Comment: (NOTE) The eGFR has been calculated using the CKD EPI equation. This calculation has not been validated in all clinical situations. eGFR's persistently <60 mL/min signify possible Chronic Kidney Disease.    Anion gap 8 5 - 15  CBC     Status: Abnormal   Collection Time: 04/23/17  9:11 PM  Result Value Ref Range   WBC 7.8 3.8 - 10.6 K/uL   RBC 4.62 4.40 - 5.90 MIL/uL   Hemoglobin 13.6 13.0 - 18.0 g/dL   HCT 42.1 40.0 - 52.0 %   MCV 91.1 80.0 - 100.0 fL   MCH 29.5 26.0 - 34.0 pg   MCHC 32.4 32.0 - 36.0 g/dL   RDW 17.2 (H) 11.5 - 14.5 %   Platelets 212 150 - 440 K/uL  Urinalysis, Complete w Microscopic     Status: Abnormal   Collection Time: 04/23/17  9:11 PM  Result Value Ref Range   Color, Urine STRAW (A) YELLOW   APPearance CLEAR (A) CLEAR   Specific Gravity, Urine 1.006 1.005 - 1.030   pH 7.0 5.0 - 8.0   Glucose, UA NEGATIVE NEGATIVE mg/dL   Hgb urine dipstick SMALL (A) NEGATIVE   Bilirubin Urine NEGATIVE NEGATIVE   Ketones, ur NEGATIVE NEGATIVE mg/dL   Protein, ur 100 (A) NEGATIVE mg/dL   Nitrite NEGATIVE NEGATIVE   Leukocytes, UA NEGATIVE NEGATIVE   RBC / HPF 0-5 0 - 5 RBC/hpf   WBC, UA NONE SEEN 0 - 5 WBC/hpf   Bacteria, UA NONE SEEN NONE SEEN   Squamous Epithelial / LPF 0-5 (A) NONE SEEN  Troponin I     Status: None   Collection Time: 04/23/17  9:11 PM  Result Value Ref Range   Troponin I <0.03 <0.03 ng/mL  Basic metabolic panel     Status: Abnormal   Collection Time: 04/24/17  3:20 AM  Result Value Ref Range   Sodium 136 135 - 145 mmol/L   Potassium 4.1 3.5 - 5.1 mmol/L    Chloride 104 101 - 111 mmol/L   CO2 24 22 - 32 mmol/L   Glucose, Bld 122 (H) 65 - 99 mg/dL   BUN 17 6 - 20 mg/dL   Creatinine, Ser 1.94 (H) 0.61 - 1.24 mg/dL   Calcium 8.8 (L) 8.9 - 10.3 mg/dL   GFR calc non Af Amer 32 (L) >60 mL/min   GFR calc Af Amer 37 (L) >60 mL/min    Comment: (NOTE) The eGFR has been calculated using the CKD EPI equation. This calculation has not been validated in all clinical situations. eGFR's persistently <60 mL/min signify possible Chronic Kidney Disease.    Anion gap 8 5 - 15  CBC     Status: Abnormal   Collection Time: 04/24/17  3:20 AM  Result Value Ref Range   WBC 6.8 3.8 - 10.6 K/uL   RBC 4.52 4.40 - 5.90 MIL/uL   Hemoglobin 13.4 13.0 - 18.0 g/dL   HCT 41.2 40.0 - 52.0 %   MCV 91.2 80.0 - 100.0 fL   MCH 29.6 26.0 - 34.0 pg   MCHC 32.4 32.0 - 36.0 g/dL   RDW 17.0 (H) 11.5 - 14.5 %   Platelets 222 150 - 440 K/uL  Troponin I     Status: None   Collection Time: 04/24/17  3:20 AM  Result Value Ref Range   Troponin I <0.03 <0.03 ng/mL  Magnesium     Status: None   Collection Time: 04/24/17  3:49 AM  Result Value Ref Range   Magnesium 2.2 1.7 - 2.4 mg/dL  TSH     Status: Abnormal   Collection Time: 04/24/17  3:49 AM  Result Value Ref Range   TSH 0.036 (L) 0.350 - 4.500 uIU/mL    Comment: Performed by a 3rd Generation assay with a functional sensitivity of <=0.01 uIU/mL.  Glucose, capillary     Status: Abnormal   Collection Time: 04/24/17  4:06 AM  Result Value Ref Range   Glucose-Capillary 136 (H) 65 - 99 mg/dL  Glucose, capillary     Status: Abnormal   Collection Time: 04/24/17  7:38 AM  Result Value Ref Range   Glucose-Capillary 110 (H) 65 - 99 mg/dL    Ct Head Wo Contrast  Result Date: 04/23/2017 CLINICAL DATA:  Dizziness and vomiting while ambulating to restroom. Feeling unwell today. Suspect intracranial hemorrhage. EXAM: CT HEAD WITHOUT CONTRAST TECHNIQUE: Contiguous axial  images were obtained from the base of the skull through  the vertex without intravenous contrast. COMPARISON:  CT HEAD March 13, 2011 FINDINGS: BRAIN: No intraparenchymal hemorrhage, mass effect nor midline shift. The ventricles and sulci are normal for age. Patchy to confluent supratentorial white matter hypodensities. 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 No acute large vascular territory infarcts. No abnormal extra-axial fluid collections. Basal cisterns are patent. VASCULAR: Moderate calcific atherosclerosis of the carotid siphons. SKULL: No skull fracture. No significant scalp soft tissue swelling. 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 Dehiscent RIGHT jugular bulb. The included ocular globes and orbital contents are non-suspicious. Status post bilateral ocular lens implants. OTHER: None. IMPRESSION: 1. No acute intracranial process. 2. Moderate to severe chronic small vessel ischemic disease. 3. 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 Electronically Signed   By: Elon Alas M.D.   On: 04/23/2017 23:25    Review of Systems  Constitutional: Positive for malaise/fatigue.  HENT: Positive for congestion.   Eyes: Negative.   Respiratory: Positive for shortness of breath.   Cardiovascular: Positive for palpitations, orthopnea, leg swelling and PND.  Gastrointestinal: Negative.   Genitourinary: Negative.   Musculoskeletal: Positive for myalgias.  Skin: Negative.   Neurological: Positive for dizziness, tingling and weakness.  Endo/Heme/Allergies: Negative.   Psychiatric/Behavioral: Negative.    Blood pressure (!) 173/80, pulse (!) 57, temperature 98.2 F (36.8 C), temperature source Oral, resp. rate 14, height 5' 11"  (1.803 m), weight 200 lb 3.2 oz (90.8 kg), SpO2 97 %. Physical Exam  Nursing note and vitals reviewed. Constitutional: He is oriented to person, place, and time. He appears well-developed and well-nourished.  HENT:  Head:  Normocephalic and atraumatic.  Eyes: Pupils are equal, round, and reactive to light. Conjunctivae and EOM are normal.  Neck: Normal range of motion. Neck supple.  Cardiovascular: Regular rhythm, S1 normal and normal pulses.  Bradycardia present.   Murmur heard.  Systolic murmur is present with a grade of 2/6  Respiratory: Effort normal and breath sounds normal.  GI: Soft. Bowel sounds are normal.  Musculoskeletal: Normal range of motion. He exhibits edema.  Neurological: He is alert and oriented to person, place, and time. He has normal reflexes.  Skin: Skin is warm and dry.  Psychiatric: He has a normal mood and affect.    Assessment/Plan: Bradycardia Vertigo Lightheaded Congestive heart failure Cardiomyopathy Hypertension Coronary artery disease Chronic renal insufficiency TIA Chronic diarrhea . Plan Agree with reducing beta blockers Consider reducing amiodarone Consider long-term anticoagulation Unclear about atrial fibrillation Consider long-term anticoagulation  Refer the patient to nephrology for renal insufficiency Consider echocardiogram for reassessment of heart failure symptoms    Jalynn Waddell D Ellory Khurana 04/24/2017, 9:13 AM

## 2017-04-24 NOTE — Plan of Care (Signed)
Problem: Safety: Goal: Ability to remain free from injury will improve Outcome: Progressing Fall precautions in place, non skid socks when oob  Problem: Tissue Perfusion: Goal: Risk factors for ineffective tissue perfusion will decrease Outcome: Progressing SQ Heparin

## 2017-04-25 ENCOUNTER — Observation Stay
Admit: 2017-04-25 | Discharge: 2017-04-25 | Disposition: A | Discharge status: Home / Self Care | Payer: Medicare Other | Attending: Family Medicine | Admitting: Family Medicine

## 2017-04-25 DIAGNOSIS — I10 Essential (primary) hypertension: Secondary | ICD-10-CM | POA: Diagnosis not present

## 2017-04-25 DIAGNOSIS — R9431 Abnormal electrocardiogram [ECG] [EKG]: Secondary | ICD-10-CM | POA: Diagnosis not present

## 2017-04-25 DIAGNOSIS — R42 Dizziness and giddiness: Secondary | ICD-10-CM | POA: Diagnosis not present

## 2017-04-25 DIAGNOSIS — R197 Diarrhea, unspecified: Secondary | ICD-10-CM | POA: Diagnosis not present

## 2017-04-25 DIAGNOSIS — I509 Heart failure, unspecified: Secondary | ICD-10-CM | POA: Diagnosis not present

## 2017-04-25 LAB — GLUCOSE, CAPILLARY: Glucose-Capillary: 130 mg/dL — ABNORMAL HIGH (ref 65–99)

## 2017-04-25 MED ORDER — HYDRALAZINE HCL 25 MG PO TABS: 25.0000 mg | ORAL_TABLET | Freq: Three times a day (TID) | ORAL | Status: DC

## 2017-04-25 NOTE — Discharge Instructions (Addendum)
[facility] - [place] at [facility]  DIET:  Cardiac diet  DISCHARGE CONDITION:  Stable  ACTIVITY:  Activity as tolerated  OXYGEN:  Home Oxygen: No.   Oxygen Delivery: room air  DISCHARGE LOCATION:  home   ADDITIONAL DISCHARGE INSTRUCTION:keep log of bp to take to primary md at at visit   If you experience worsening of your admission symptoms, develop shortness of breath, life threatening emergency, suicidal or homicidal thoughts you must seek medical attention immediately by calling 911 or calling your MD immediately  if symptoms less severe.  You Must read complete instructions/literature along with all the possible adverse reactions/side effects for all the Medicines you take and that have been prescribed to you. Take any new Medicines after you have completely understood and accpet all the possible adverse reactions/side effects.   Please note  You were cared for by a hospitalist during your hospital stay. If you have any questions about your discharge medications or the care you received while you were in the hospital after you are discharged, you can call the unit and asked to speak with the hospitalist on call if the hospitalist that took care of you is not available. Once you are discharged, your primary care physician will handle any further medical issues. Please note that NO REFILLS for any discharge medications will be authorized once you are discharged, as it is imperative that you return to your primary care physician (or establish a relationship with a primary care physician if you do not have one) for your aftercare needs so that they can reassess your need for medications and monitor your lab values.

## 2017-04-25 NOTE — Progress Notes (Signed)
*  PRELIMINARY RESULTS* Echocardiogram 2D Echocardiogram has been performed.  Cristela BlueHege, Garreth Burnsworth 04/25/2017, 9:43 AM

## 2017-04-25 NOTE — Discharge Summary (Signed)
[facility] - [place] at [facility]  Larry Lawrence, 76 y.o., DOB [birth year], MRN [id number]. Admission date: 04/23/2017 Discharge Date 04/25/2017 Primary MD Duanne Limerick, MD Admitting Physician Bertrum Sol, MD  Admission Diagnosis  Symptomatic bradycardia [R00.1] Generalized weakness [R53.1] Near syncope [R55] Bradycardia [R00.1]  Discharge Diagnosis   Active Problems: Dizziness and near syncope Bradycardia Chronic diarrhea Chronic benign essential hypertension  Chronic systolic CHF      Hospital Course Action 76 year old with multiple medical problems including sick sinus syndrome, chronic bradycardia, coronary artery disease status post CABG and stent who presented with dizziness and near syncope. Patient day before was noted to have low blood pressure and his cardiologist of this. He was admitted and his antihypertensives were held but then his blood pressure started going up. Patient also had bradycardia but he is on Coreg due to chronic systolic CHF his Coreg was continued. Heart rate is staying above 50 is not dizzy anymore blood pressures better after resumption of his home medications. I have recommended patient keep a log of his blood pressure.            Consults  cardiology  Significant Tests:  See full reports for all details     Ct Head Wo Contrast  Result Date: 04/23/2017 CLINICAL DATA:  Dizziness and vomiting while ambulating to restroom. Feeling unwell today. Suspect intracranial hemorrhage. EXAM: CT HEAD WITHOUT CONTRAST TECHNIQUE: Contiguous axial images were obtained from the base of the skull through the vertex without intravenous contrast. COMPARISON:  CT HEAD March 13, 2011 FINDINGS: BRAIN: No intraparenchymal hemorrhage, mass effect nor midline shift. The ventricles and sulci are normal for age. Patchy to confluent supratentorial white matter hypodensities. Old bilateral basal ganglia lacunar infarcts. Old small bilateral  cerebellar infarcts. No acute large vascular territory infarcts. No abnormal extra-axial fluid collections. Basal cisterns are patent. VASCULAR: Moderate calcific atherosclerosis of the carotid siphons. SKULL: No skull fracture. No significant scalp soft tissue swelling. 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 Dehiscent RIGHT jugular bulb. The included ocular globes and orbital contents are non-suspicious. Status post bilateral ocular lens implants. OTHER: None. IMPRESSION: 1. No acute intracranial process. 2. Moderate to severe chronic small vessel ischemic disease. 3. 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 Electronically Signed   By: Awilda Metro M.D.   On: 04/23/2017 23:25       Today   Subjective:   Larry Lawrence  Feeling well denies any complaints  Objective:   Blood pressure 127/75, pulse (!) 58, temperature 97.6 F (36.4 C), temperature source Oral, resp. rate 18, height 5\' 11"  (1.803 m), weight 199 lb 11.2 oz (90.6 kg), SpO2 96 %.  .  Intake/Output Summary (Last 24 hours) at 04/25/17 1542 Last data filed at 04/25/17 1027  Gross per 24 hour  Intake              480 ml  Output             2250 ml  Net            -1770 ml    Exam VITAL SIGNS: Blood pressure 127/75, pulse (!) 58, temperature 97.6 F (36.4 C), temperature source Oral, resp. rate 18, height 5\' 11"  (1.803 m), weight 199 lb 11.2 oz (90.6 kg), SpO2 96 %.  GENERAL:  76 y.o.-year-old patient lying in the bed with no acute distress.  EYES: Pupils equal, round, reactive to light and accommodation. No scleral icterus. Extraocular muscles intact.  HEENT:  Head atraumatic, normocephalic. Oropharynx and nasopharynx clear.  NECK:  Supple, no jugular venous distention. No thyroid enlargement, no tenderness.  LUNGS: Normal breath sounds bilaterally, no wheezing, rales,rhonchi or crepitation. No use of accessory muscles of respiration.  CARDIOVASCULAR: S1, S2 normal. No  murmurs, rubs, or gallops.  ABDOMEN: Soft, nontender, nondistended. Bowel sounds present. No organomegaly or mass.  EXTREMITIES: No pedal edema, cyanosis, or clubbing.  NEUROLOGIC: Cranial nerves II through XII are intact. Muscle strength 5/5 in all extremities. Sensation intact. Gait not checked.  PSYCHIATRIC: The patient is alert and oriented x 3.  SKIN: No obvious rash, lesion, or ulcer.   Data Review     CBC w Diff: Lab Results  Component Value Date   WBC 6.8 04/24/2017   HGB 13.4 04/24/2017   HGB 13.1 12/29/2015   HCT 41.2 04/24/2017   HCT 40.2 12/29/2015   PLT 222 04/24/2017   PLT 198 12/29/2015   LYMPHOPCT 4 02/03/2017   LYMPHOPCT 11.9 07/18/2014   MONOPCT 6 02/03/2017   MONOPCT 6.0 07/18/2014   EOSPCT 0 02/03/2017   EOSPCT 1.1 07/18/2014   BASOPCT 0 02/03/2017   BASOPCT 0.4 07/18/2014   CMP: Lab Results  Component Value Date   NA 136 04/24/2017   NA 142 12/29/2015   NA 138 07/18/2014   K 4.1 04/24/2017   K 3.9 07/18/2014   CL 104 04/24/2017   CL 107 07/18/2014   CO2 24 04/24/2017   CO2 24 07/18/2014   BUN 17 04/24/2017   BUN 16 12/29/2015   BUN 20 (H) 07/18/2014   CREATININE 1.94 (H) 04/24/2017   CREATININE 1.74 (H) 07/18/2014   PROT 7.1 02/03/2017   PROT 6.7 07/18/2014   ALBUMIN 3.7 02/03/2017   ALBUMIN 2.7 (L) 07/18/2014   BILITOT 2.1 (H) 02/03/2017   BILITOT 1.0 07/18/2014   ALKPHOS 74 02/03/2017   ALKPHOS 95 07/18/2014   AST 23 02/03/2017   AST 14 (L) 07/18/2014   ALT 14 (L) 02/03/2017   ALT 21 07/18/2014  .  Micro Results No results found for this or any previous visit (from the past 240 hour(s)).      Code Status Orders        Start     Ordered   04/24/17 0338  Do not attempt resuscitation (DNR)  Continuous    Question Answer Comment  In the event of cardiac or respiratory ARREST Do not call a "code blue"   In the event of cardiac or respiratory ARREST Do not perform Intubation, CPR, defibrillation or ACLS   In the event of  cardiac or respiratory ARREST Use medication by any route, position, wound care, and other measures to relive pain and suffering. May use oxygen, suction and manual treatment of airway obstruction as needed for comfort.      04/24/17 1610    Code Status History    Date Active Date Inactive Code Status Order ID Comments User Context   04/24/2017  2:49 AM 04/24/2017  3:37 AM DNR [id number]  Ihor Austin, MD ED   02/01/2016  9:49 PM 02/05/2016  4:46 PM DNR [id number]  Oralia Manis, MD ED   08/09/2015  4:44 PM 08/11/2015  4:54 PM DNR [id number]  Wyatt Haste, MD ED   08/09/2015  4:43 PM 08/09/2015  4:44 PM Full Code [id number]  Wyatt Haste, MD ED   02/20/2015  9:37 AM 02/21/2015  7:16 PM DNR [id number]  Crissie Figures, MD Inpatient    Advance Directive Documentation  Most Recent Value  Type of Advance Directive  Living will, Healthcare Power of Attorney  Pre-existing out of facility DNR order (yellow form or pink MOST form)  -  "MOST" Form in Place?  -          Follow-up Information    Duanne LimerickJones, Deanna C, MD On 05/05/2017.   Specialty:  Family Medicine Why:  Appointment Time: 9:30am Contact information: [address] [place] [address] [phone]        [name], [name], MD. Go on 05/05/2017.   Specialty:  Cardiology Why:  Appointment Time: at 11:15am.  Contact information: [address] [facility] [place] [address] [phone]           Discharge Medications   Allergies as of 04/25/2017      Reactions   Niaspan [niacin Er] Other (See Comments)   Reaction:  Hot flashes       Medication List    STOP taking these medications   anastrozole 1 MG tablet Commonly known as:  ARIMIDEX   sulfamethoxazole-trimethoprim 800-160 MG tablet Commonly known as:  BACTRIM DS,SEPTRA DS     TAKE these medications   allopurinol 300 MG tablet Commonly known as:  ZYLOPRIM TAKE (1) TABLET BY MOUTH TWICE DAILY   amiodarone 200 MG  tablet Commonly known as:  PACERONE Take 200 mg by mouth daily.   carvedilol 25 MG tablet Commonly known as:  COREG Take 25 mg by mouth 2 (two) times daily with a meal.   chlorhexidine 4 % external liquid Commonly known as:  HIBICLENS Apply topically daily as needed. What changed:  how much to take  reasons to take this   ENTRESTO 24-26 MG Generic drug:  sacubitril-valsartan Take 1 tablet by mouth 2 (two) times daily.   furosemide 40 MG tablet Commonly known as:  LASIX Take 1 tablet (40 mg total) by mouth 2 (two) times daily. Take 2 tabs in the morning   hydrALAZINE 25 MG tablet Commonly known as:  APRESOLINE Take 1 tablet (25 mg total) by mouth every 8 (eight) hours. What changed:  medication strength  how much to take  when to take this   isosorbide mononitrate 30 MG 24 hr tablet Commonly known as:  IMDUR Take 30 mg by mouth daily.   loratadine 10 MG tablet Commonly known as:  CLARITIN Take 1 tablet (10 mg total) by mouth daily. What changed:  when to take this  reasons to take this   potassium chloride SA 20 MEQ tablet Commonly known as:  K-DUR,KLOR-CON Take 1 tablet (20 mEq total) by mouth 3 (three) times daily.   timolol 0.5 % ophthalmic solution Commonly known as:  TIMOPTIC Place 1 drop into both eyes 2 (two) times daily. Dr Inez PilgrimBrasington          Total Time in preparing paper work, data evaluation and todays exam - 35 minutes  Auburn BilberryPATEL, Chananya Canizalez M.D on 04/25/2017 at 3:42 PM  [facility]   Office  [phone]

## 2017-04-28 ENCOUNTER — Ambulatory Visit: Payer: Medicare Other | Admitting: Surgery

## 2017-04-29 DIAGNOSIS — N189 Chronic kidney disease, unspecified: Secondary | ICD-10-CM | POA: Diagnosis not present

## 2017-04-30 ENCOUNTER — Telehealth: Payer: Self-pay | Admitting: Family Medicine

## 2017-04-30 NOTE — Telephone Encounter (Signed)
Called pt to sched for AWV with Nurse Health Advisor.  C/b #  [phone]  Kathryn Brown  

## 2017-05-05 ENCOUNTER — Inpatient Hospital Stay: Payer: Medicare Other | Admitting: Family Medicine

## 2017-05-05 DIAGNOSIS — I10 Essential (primary) hypertension: Secondary | ICD-10-CM | POA: Diagnosis not present

## 2017-05-05 DIAGNOSIS — I2510 Atherosclerotic heart disease of native coronary artery without angina pectoris: Secondary | ICD-10-CM | POA: Diagnosis not present

## 2017-05-05 DIAGNOSIS — I5022 Chronic systolic (congestive) heart failure: Secondary | ICD-10-CM | POA: Diagnosis not present

## 2017-05-05 DIAGNOSIS — R42 Dizziness and giddiness: Secondary | ICD-10-CM | POA: Diagnosis not present

## 2017-05-05 LAB — ECHOCARDIOGRAM COMPLETE
HEIGHTINCHES: 71 in
WEIGHTICAEL: 3195.2 [oz_av]

## 2017-05-28 DIAGNOSIS — H40003 Preglaucoma, unspecified, bilateral: Secondary | ICD-10-CM | POA: Diagnosis not present

## 2017-05-28 LAB — HM DIABETES EYE EXAM

## 2017-06-17 ENCOUNTER — Ambulatory Visit (INDEPENDENT_AMBULATORY_CARE_PROVIDER_SITE_OTHER): Payer: Medicare Other | Admitting: Family Medicine

## 2017-06-17 ENCOUNTER — Encounter: Payer: Self-pay | Admitting: Family Medicine

## 2017-06-17 VITALS — BP 134/70 | HR 80 | Ht 71.0 in | Wt 216.0 lb

## 2017-06-17 DIAGNOSIS — R609 Edema, unspecified: Secondary | ICD-10-CM

## 2017-06-17 DIAGNOSIS — J309 Allergic rhinitis, unspecified: Secondary | ICD-10-CM | POA: Diagnosis not present

## 2017-06-17 DIAGNOSIS — I5022 Chronic systolic (congestive) heart failure: Secondary | ICD-10-CM | POA: Diagnosis not present

## 2017-06-17 DIAGNOSIS — R635 Abnormal weight gain: Secondary | ICD-10-CM | POA: Diagnosis not present

## 2017-06-17 MED ORDER — MONTELUKAST SODIUM 10 MG PO TABS: 10.0000 mg | ORAL_TABLET | Freq: Every day | ORAL | 3 refills | Status: DC

## 2017-06-17 NOTE — Progress Notes (Signed)
Name: Larry Lawrence   MRN: [id number]    DOB: 02-Aug-1940   Date:06/17/2017       Progress Note  Subjective  Chief Complaint  Chief Complaint  Patient presents with  . Cough    x 2 weeks- clear production, gets worse at night    Cough  This is a new problem. The current episode started 1 to 4 weeks ago (2 weeks). The problem has been unchanged. The problem occurs hourly. The cough is non-productive. Associated symptoms include ear pain, postnasal drip and rhinorrhea. Pertinent negatives include no chest pain, chills, ear congestion, fever, headaches, heartburn, hemoptysis, myalgias, nasal congestion, rash, sore throat, shortness of breath, sweats, weight loss or wheezing. The symptoms are aggravated by lying down. He has tried OTC cough suppressant ("little bit") for the symptoms. The treatment provided mild relief. There is no history of asthma, bronchiectasis, bronchitis, COPD, emphysema, environmental allergies or pneumonia.    No problem-specific Assessment & Plan notes found for this encounter.   Past Medical History:  Diagnosis Date  . Allergy   . CHF (congestive heart failure) (HCC)   . Coronary artery disease   . Gout   . Insomnia   . Renal disorder   . Sleeps in sitting position due to orthopnea   . TIA (transient ischemic attack)     Past Surgical History:  Procedure Laterality Date  . CARDIAC CATHETERIZATION N/A 02/02/2016   Procedure: Left Heart Cath and Coronary Angiography;  Surgeon: Lamar Blinks, MD;  Location: [facility] INVASIVE CV LAB;  Service: Cardiovascular;  Laterality: N/A;  . CARDIAC SURGERY     3 stents in "main artery"  . JOINT REPLACEMENT      Family History  Problem Relation Age of Onset  . Hypertension Mother   . Pancreatic cancer Mother   . Hypertension Father   . Prostate cancer Father     Social History   Socioeconomic History  . Marital status: Married    Spouse name: Not on file  . Number of children: Not on file  . Years of  education: Not on file  . Highest education level: Not on file  Social Needs  . Financial resource strain: Not on file  . Food insecurity - worry: Not on file  . Food insecurity - inability: Not on file  . Transportation needs - medical: Not on file  . Transportation needs - non-medical: Not on file  Occupational History  . Occupation: retired  Tobacco Use  . Smoking status: Former Games developer  . Smokeless tobacco: Never Used  Substance and Sexual Activity  . Alcohol use: No  . Drug use: No  . Sexual activity: Not Currently  Other Topics Concern  . Not on file  Social History Narrative  . Not on file    Allergies  Allergen Reactions  . Niaspan [Niacin Er] Other (See Comments)    Reaction:  Hot flashes     Outpatient Medications Prior to Visit  Medication Sig Dispense Refill  . allopurinol (ZYLOPRIM) 300 MG tablet TAKE (1) TABLET BY MOUTH TWICE DAILY 60 tablet 11  . amiodarone (PACERONE) 200 MG tablet Take 200 mg by mouth daily. Dr Gwen Pounds    . carvedilol (COREG) 25 MG tablet Take 25 mg by mouth 2 (two) times daily with a meal. Dr Gwen Pounds    . furosemide (LASIX) 40 MG tablet Take 1 tablet (40 mg total) by mouth 2 (two) times daily. Take 2 tabs in the morning (Patient taking differently: Take 40  mg by mouth 2 (two) times daily. Take 2 tabs in the morning/ Dr Gwen PoundsKowalski) 30 tablet   . hydrALAZINE (APRESOLINE) 25 MG tablet Take 1 tablet (25 mg total) by mouth every 8 (eight) hours. (Patient taking differently: Take 25 mg by mouth every 8 (eight) hours. Dr Gwen PoundsKowalski)    . isosorbide mononitrate (IMDUR) 30 MG 24 hr tablet Take 30 mg by mouth daily. Dr Gwen PoundsKowalski    . loratadine (CLARITIN) 10 MG tablet Take 1 tablet (10 mg total) by mouth daily. (Patient taking differently: Take 10 mg by mouth daily as needed for allergies. ) 30 tablet 5  . potassium chloride SA (K-DUR,KLOR-CON) 20 MEQ tablet Take 1 tablet (20 mEq total) by mouth 3 (three) times daily. (Patient taking differently: Take 20 mEq  by mouth 3 (three) times daily. Dr Gwen PoundsKowalski) 90 tablet 5  . sacubitril-valsartan (ENTRESTO) 24-26 MG Take 1 tablet by mouth 2 (two) times daily.     . timolol (TIMOPTIC) 0.5 % ophthalmic solution Place 1 drop into both eyes 2 (two) times daily. Dr Inez PilgrimBrasington    . chlorhexidine (HIBICLENS) 4 % external liquid Apply topically daily as needed. (Patient taking differently: Apply 1 application topically daily as needed (wound care). ) 120 mL 0   No facility-administered medications prior to visit.     Review of Systems  Constitutional: Negative for chills, fever, malaise/fatigue and weight loss.  HENT: Positive for ear pain, postnasal drip and rhinorrhea. Negative for ear discharge and sore throat.   Eyes: Negative for blurred vision.  Respiratory: Positive for cough. Negative for hemoptysis, sputum production, shortness of breath and wheezing.   Cardiovascular: Negative for chest pain, palpitations, orthopnea, claudication, leg swelling and PND.  Gastrointestinal: Negative for abdominal pain, blood in stool, constipation, diarrhea, heartburn, melena and nausea.  Genitourinary: Negative for dysuria, frequency, hematuria and urgency.  Musculoskeletal: Negative for back pain, joint pain, myalgias and neck pain.  Skin: Negative for rash.  Neurological: Negative for dizziness, tingling, sensory change, focal weakness and headaches.  Endo/Heme/Allergies: Negative for environmental allergies and polydipsia. Does not bruise/bleed easily.  Psychiatric/Behavioral: Negative for depression and suicidal ideas. The patient is not nervous/anxious and does not have insomnia.      Objective  Vitals:   06/17/17 1506  BP: 134/70  Pulse: 80  Weight: 216 lb (98 kg)  Height: 5\' 11"  (1.803 m)    Physical Exam  Constitutional: He is oriented to person, place, and time and well-developed, well-nourished, and in no distress.  HENT:  Head: Normocephalic.  Right Ear: External ear normal.  Left Ear: External  ear normal.  Nose: Nose normal.  Mouth/Throat: Oropharynx is clear and moist.  Eyes: Conjunctivae and EOM are normal. Pupils are equal, round, and reactive to light. Right eye exhibits no discharge. Left eye exhibits no discharge. No scleral icterus.  Neck: Normal range of motion. Neck supple. No JVD present. No tracheal deviation present. No thyromegaly present.  Cardiovascular: Normal rate, regular rhythm, S1 normal, S2 normal, normal heart sounds and intact distal pulses. PMI is not displaced. Exam reveals no gallop, no S3, no S4 and no friction rub.  No murmur heard. No JVD  Pulmonary/Chest: Breath sounds normal. No respiratory distress. He has no wheezes. He has no rales.  Abdominal: Soft. Bowel sounds are normal. He exhibits no distension and no mass. There is no hepatosplenomegaly. There is no tenderness. There is no rebound, no guarding and no CVA tenderness.  Musculoskeletal: Normal range of motion. He exhibits no edema or  tenderness.  Lymphadenopathy:    He has no cervical adenopathy.  Neurological: He is alert and oriented to person, place, and time. He has normal sensation, normal strength, normal reflexes and intact cranial nerves. No cranial nerve deficit.  Skin: Skin is warm. No rash noted.  Psychiatric: Mood and affect normal.  Nursing note and vitals reviewed.     Assessment & Plan  Problem List Items Addressed This Visit      Cardiovascular and Mediastinum   Chronic systolic CHF (congestive heart failure) (HCC)    Other Visit Diagnoses    Allergic rhinitis, unspecified seasonality, unspecified trigger    -  Primary   Relevant Medications   montelukast (SINGULAIR) 10 MG tablet   Weight gain with edema       resume furosemide bid      Meds ordered this encounter  Medications  . montelukast (SINGULAIR) 10 MG tablet    Sig: Take 1 tablet (10 mg total) by mouth at bedtime.    Dispense:  30 tablet    Refill:  3      Dr. Hayden Rasmusseneanna Zackeriah [facility]  06/17/17

## 2017-06-25 ENCOUNTER — Ambulatory Visit (INDEPENDENT_AMBULATORY_CARE_PROVIDER_SITE_OTHER): Payer: Medicare Other

## 2017-06-25 VITALS — BP 120/70 | HR 60 | Temp 97.9°F | Resp 12 | Ht 71.0 in | Wt 213.6 lb

## 2017-06-25 DIAGNOSIS — Z0000 Encounter for general adult medical examination without abnormal findings: Secondary | ICD-10-CM

## 2017-06-25 DIAGNOSIS — Z23 Encounter for immunization: Secondary | ICD-10-CM

## 2017-06-25 NOTE — Progress Notes (Signed)
Subjective:   Larry Lawrence is a 76 y.o. male who presents for an Initial Medicare Annual Wellness Visit.  Review of Systems  N/A Cardiac Risk Factors include: advanced age (>6155men, 3>65 women);sedentary lifestyle;male gender;family history of premature cardiovascular disease;hypertension    Objective:    Today's Vitals   06/25/17 1123  BP: 120/70  Pulse: 60  Resp: 12  Temp: 97.9 F (36.6 C)  TempSrc: Oral  Weight: 213 lb 9.6 oz (96.9 kg)  Height: 5\' 11"  (1.803 m)   Body mass index is 29.79 kg/m.  Advanced Directives 06/25/2017 04/24/2017 02/03/2017 02/01/2016 08/09/2015 08/09/2015 02/20/2015  Does Patient Have a Medical Advance Directive? Yes Yes Yes Yes No No Yes  Type of Estate agentAdvance Directive Healthcare Power of [place];Living will Living will;Healthcare Power of State Street Corporationttorney Healthcare Power of [place];Living will Out of facility DNR (pink MOST or yellow form) - - Out of facility DNR (pink MOST or yellow form)  Does patient want to make changes to medical advance directive? - No - Patient declined - No - Patient declined - - -  Copy of Healthcare Power of Attorney in Chart? No - copy requested No - copy requested No - copy requested No - copy requested - - -  Would patient like information on creating a medical advance directive? - - - - No - patient declined information No - patient declined information -  Pre-existing out of facility DNR order (yellow form or pink MOST form) - - - Yellow form placed in chart (order not valid for inpatient use) - - Yellow form placed in chart (order not valid for inpatient use)    Current Medications (verified) Outpatient Encounter Medications as of 06/25/2017  Medication Sig  . allopurinol (ZYLOPRIM) 300 MG tablet TAKE (1) TABLET BY MOUTH TWICE DAILY  . amiodarone (PACERONE) 200 MG tablet Take 200 mg by mouth daily. Dr Gwen PoundsKowalski  . carvedilol (COREG) 25 MG tablet Take 25 mg by mouth 2 (two) times daily with a meal. Dr Gwen PoundsKowalski  . furosemide (LASIX)  40 MG tablet Take 1 tablet (40 mg total) by mouth 2 (two) times daily. Take 2 tabs in the morning (Patient taking differently: Take 40 mg by mouth 2 (two) times daily. Take 2 tabs in the morning/ Dr Gwen PoundsKowalski)  . hydrALAZINE (APRESOLINE) 25 MG tablet Take 1 tablet (25 mg total) by mouth every 8 (eight) hours. (Patient taking differently: Take 25 mg by mouth every 8 (eight) hours. Dr Gwen PoundsKowalski)  . isosorbide mononitrate (IMDUR) 30 MG 24 hr tablet Take 30 mg by mouth daily. Dr Gwen PoundsKowalski  . loratadine (CLARITIN) 10 MG tablet Take 1 tablet (10 mg total) by mouth daily. (Patient taking differently: Take 10 mg by mouth daily as needed for allergies. )  . montelukast (SINGULAIR) 10 MG tablet Take 1 tablet (10 mg total) by mouth at bedtime.  . potassium chloride SA (K-DUR,KLOR-CON) 20 MEQ tablet Take 1 tablet (20 mEq total) by mouth 3 (three) times daily. (Patient taking differently: Take 20 mEq by mouth 3 (three) times daily. Dr Gwen PoundsKowalski)  . sacubitril-valsartan (ENTRESTO) 24-26 MG Take 1 tablet by mouth 2 (two) times daily.   . timolol (TIMOPTIC) 0.5 % ophthalmic solution Place 1 drop into both eyes 2 (two) times daily. Dr Inez PilgrimBrasington   No facility-administered encounter medications on file as of 06/25/2017.     Allergies (verified) Niaspan [niacin er]   History: Past Medical History:  Diagnosis Date  . Allergy   . CHF (congestive heart failure) (HCC)   .  Coronary artery disease   . Gout   . Insomnia   . Renal disorder   . Sleeps in sitting position due to orthopnea   . TIA (transient ischemic attack)    Past Surgical History:  Procedure Laterality Date  . CARDIAC CATHETERIZATION N/A 02/02/2016   Procedure: Left Heart Cath and Coronary Angiography;  Surgeon: Lamar Blinks, MD;  Location: [facility] INVASIVE CV LAB;  Service: Cardiovascular;  Laterality: N/A;  . CARDIAC SURGERY     3 stents in "main artery"  . JOINT REPLACEMENT     Family History  Problem Relation Age of Onset  . Hypertension  Mother   . Pancreatic cancer Mother   . Hypertension Father   . Prostate cancer Father    Social History   Socioeconomic History  . Marital status: Married    Spouse name: None  . Number of children: 2  . Years of education: None  . Highest education level: 12th grade  Social Needs  . Financial resource strain: Not hard at all  . Food insecurity - worry: Never true  . Food insecurity - inability: Never true  . Transportation needs - medical: No  . Transportation needs - non-medical: No  Occupational History  . Occupation: retired  Tobacco Use  . Smoking status: Former Smoker    Packs/day: 2.00    Years: 3.00    Pack years: 6.00    Types: Cigarettes    Last attempt to quit: 1960    Years since quitting: 59.0  . Smokeless tobacco: Never Used  . Tobacco comment: Smoking cessation materials not required  Substance and Sexual Activity  . Alcohol use: No  . Drug use: No  . Sexual activity: Not Currently  Other Topics Concern  . None  Social History Narrative  . None   Tobacco Counseling Counseling given: No Comment: Smoking cessation materials not required   Clinical Intake:  Pre-visit preparation completed: Yes  Pain : No/denies pain  BMI - recorded: 29.79 Nutritional Status: BMI 25 -29 Overweight Nutritional Risks: None Diabetes: No  How often do you need to have someone help you when you read instructions, pamphlets, or other written materials from your doctor or pharmacy?: 1 - Never  Interpreter Needed?: No  Information entered by :: AEVersole, LPN  Activities of Daily Living In your present state of health, do you have any difficulty performing the following activities: 06/25/2017 04/24/2017  Hearing? N N  Comment denies use of hearing aids -  Vision? N N  Comment reading glasses -  Difficulty concentrating or making decisions? N N  Walking or climbing stairs? Y Y  Comment shortness of breath, exhaustion -  Dressing or bathing? N N  Doing  errands, shopping? N Y  Quarry manager and eating ? N -  Comment denies use of dentures -  Using the Toilet? N -  In the past six months, have you accidently leaked urine? N -  Do you have problems with loss of bowel control? N -  Managing your Medications? N -  Managing your Finances? N -  Housekeeping or managing your Housekeeping? N -  Some recent data might be hidden     Immunizations and Health Maintenance Immunization History  Administered Date(s) Administered  . Influenza, High Dose Seasonal PF 04/15/2017  . Pneumococcal Conjugate-13 06/25/2017   There are no preventive care reminders to display for this patient.  Patient Care Team: Duanne Limerick, MD as PCP - General (Family Medicine) Lamar Blinks,  MD as Consulting Physician (Cardiology)  Indicate any recent Medical Services you may have received from other than [facility] providers in the past year (date may be approximate).    Assessment:   This is a routine wellness examination for Larry Lawrence.  Hearing/Vision screen Vision Screening Comments: Sees [facility] for annual eye exams  Dietary issues and exercise activities discussed: Current Exercise Habits: The patient does not participate in regular exercise at present, Exercise limited by: cardiac condition(s)  Goals    . DIET - INCREASE WATER INTAKE     Recommend to drink at least 6-8 8 oz glasses of water per day      Depression Screen PHQ 2/9 Scores 06/25/2017 04/15/2017 12/29/2015  PHQ - 2 Score 0 0 0  PHQ- 9 Score - 0 -    Fall Risk Fall Risk  06/25/2017 04/15/2017 12/29/2015  Falls in the past year? No No No    Is the patient's home free of loose throw rugs in walkways, pet beds, electrical cords, etc?   yes      Grab bars in the bathroom? yes. Uses shower chair      Handrails on the stairs?   No. Denies having stairs in or around home.      Adequate lighting?   yes Ambulates with cane and/or quad walker. Denies use of elevated toilet  seat.  Cognitive Function:     6CIT Screen 06/25/2017  What Year? 0 points  What month? 0 points  What time? 0 points  Count back from 20 0 points  Months in reverse 0 points  Repeat phrase 2 points  Total Score 2    Screening Tests Health Maintenance  Topic Date Due  . COLONOSCOPY  06/25/2018 (Originally 10/18/1992)  . TETANUS/TDAP  06/25/2018 (Originally 10/18/1961)  . PNA vac Low Risk Adult (2 of 2 - PPSV23) 06/25/2018  . INFLUENZA VACCINE  Completed    Qualifies for Shingles Vaccine? Yes. Education has been provided regarding the importance of this vaccine. Pt has been advised to call his insurance company to determine his out of pocket expense. Advised he may also receive this vaccine at his/her local pharmacy or Health Dept. Verbalized acceptance and understanding.  Cancer Screenings: Colorectal: Due for colonoscopy. Has not had one in the past. Declined my offer to refer to GI for colon cancer screening. Also declined my offer to order Cologuard or FOBT. Education has been provided regarding the importance of this exam but still declined. Verbalized acceptance and understanding of education provided today.     Plan:   I have personally reviewed and addressed the Medicare Annual Wellness questionnaire and have noted the following in the patient's chart:  A. Medical and social history B. Use of alcohol, tobacco or illicit drugs  C. Current medications and supplements D. Functional ability and status E.  Nutritional status F.  Physical activity G. Advance directives H. List of other physicians I.  Hospitalizations, surgeries, and ER visits in previous 12 months J.  Vitals K. Screenings such as hearing and vision if needed, cognitive and depression L. Referrals and appointments - none  In addition, I have reviewed and discussed with patient certain preventive protocols, quality metrics, and best practice recommendations. A written personalized care plan for preventive  services as well as general preventive health recommendations were provided to patient.  Signed,  Deon Pilling, LPN Nurse Health Advisor  MD Recommendations: Due for colon cancer screening. Declined my offer to refer to GI for colonoscopy. Also declined  my offer to order Cologuard or FOBT. Education has been provided regarding the importance of this exam but still declined. Verbalized acceptance and understanding of education provided today.  Due for Tdap. Declined my offer to administer today. Education has been provided regarding the importance of this vaccine but still declined. Pt has been advised to call his insurance company to determine his out of pocket expense. Advised he may also receive this vaccine at his local pharmacy or Health Dept. Verbalized acceptance and understanding.  Due for Shingrix or Zostavax. Education has been provided regarding the importance of this vaccine. Pt has been advised to call his insurance company to determine his out of pocket expense. Advised he may also receive this vaccine at his local pharmacy or Health Dept. Verbalized acceptance and understanding.

## 2017-06-25 NOTE — Patient Instructions (Signed)
[name] , Thank you for taking time to come for your Medicare Wellness Visit. I appreciate your ongoing commitment to your health goals. Please review the following plan we discussed and let me know if I can assist you in the future.   Screening recommendations/referrals: Colonoscopy: Declined Recommended yearly ophthalmology/optometry visit for glaucoma screening and checkup Recommended yearly dental visit for hygiene and checkup  Vaccinations: Influenza vaccine: Up to date Pneumococcal vaccine: PCV13 given today. Due for PPSV23 in 2019. Tdap vaccine: Declined. Please call your insurance company to determine your out of pocket expense. You may also receive this vaccine at your local pharmacy or Health Dept. Shingles vaccine: Declined. Please call your insurance company to determine your out of pocket expense. You may also receive this vaccine at your local pharmacy or Health Dept.  Advanced directives: Please bring a copy of your POA (Power of Attorney) and/or Living Will to your next appointment.  Conditions/risks identified: Recommend to drink at least 6-8 8 oz glasses of water per day  Next appointment: You are scheduled to see Dr. Yetta Lawrence on 06/26/17 @ 1:45pm.   Please schedule your Annual Wellness Visit with your Nurse Health Advisor in one year.  Preventive Care 7865 Years and Older, Male Preventive care refers to lifestyle choices and visits with your health care provider that can promote health and wellness. What does preventive care include?  A yearly physical exam. This is also called an annual well check.  Dental exams once or twice a year.  Routine eye exams. Ask your health care provider how often you should have your eyes checked.  Personal lifestyle choices, including:  Daily care of your teeth and gums.  Regular physical activity.  Eating a healthy diet.  Avoiding tobacco and drug use.  Limiting alcohol use.  Practicing safe sex.  Taking low doses of  aspirin every day.  Taking vitamin and mineral supplements as recommended by your health care provider. What happens during an annual well check? The services and screenings done by your health care provider during your annual well check will depend on your age, overall health, lifestyle risk factors, and family history of disease. Counseling  Your health care provider may ask you questions about your:  Alcohol use.  Tobacco use.  Drug use.  Emotional well-being.  Home and relationship well-being.  Sexual activity.  Eating habits.  History of falls.  Memory and ability to understand (cognition).  Work and work Astronomerenvironment. Screening  You may have the following tests or measurements:  Height, weight, and BMI.  Blood pressure.  Lipid and cholesterol levels. These may be checked every 5 years, or more frequently if you are over 74 years old.  Skin check.  Lung cancer screening. You may have this screening every year starting at age 74 if you have a 30-pack-year history of smoking and currently smoke or have quit within the past 15 years.  Fecal occult blood test (FOBT) of the stool. You may have this test every year starting at age 74.  Flexible sigmoidoscopy or colonoscopy. You may have a sigmoidoscopy every 5 years or a colonoscopy every 10 years starting at age 74.  Prostate cancer screening. Recommendations will vary depending on your family history and other risks.  Hepatitis C blood test.  Hepatitis B blood test.  Sexually transmitted disease (STD) testing.  Diabetes screening. This is done by checking your blood sugar (glucose) after you have not eaten for a while (fasting). You may have this done every 1-3  years.  Abdominal aortic aneurysm (AAA) screening. You may need this if you are a current or former smoker.  Osteoporosis. You may be screened starting at age 74 if you are at high risk. Talk with your health care provider about your test results,  treatment options, and if necessary, the need for more tests. Vaccines  Your health care provider may recommend certain vaccines, such as:  Influenza vaccine. This is recommended every year.  Tetanus, diphtheria, and acellular pertussis (Tdap, Td) vaccine. You may need a Td booster every 10 years.  Zoster vaccine. You may need this after age 74.  Pneumococcal 13-valent conjugate (PCV13) vaccine. One dose is recommended after age 74.  Pneumococcal polysaccharide (PPSV23) vaccine. One dose is recommended after age 74. Talk to your health care provider about which screenings and vaccines you need and how often you need them. This information is not intended to replace advice given to you by your health care provider. Make sure you discuss any questions you have with your health care provider. Document Released: 07/14/2015 Document Revised: 03/06/2016 Document Reviewed: 04/18/2015 Elsevier Interactive Patient Education  2017 [employer]  Fall Prevention in the Home Falls can cause injuries. They can happen to people of all ages. There are many things you can do to make your home safe and to help prevent falls. What can I do on the outside of my home?  Regularly fix the edges of walkways and driveways and fix any cracks.  Remove anything that might make you trip as you walk through a door, such as a raised step or threshold.  Trim any bushes or trees on the path to your home.  Use bright outdoor lighting.  Clear any walking paths of anything that might make someone trip, such as rocks or tools.  Regularly check to see if handrails are loose or broken. Make sure that both sides of any steps have handrails.  Any raised decks and porches should have guardrails on the edges.  Have any leaves, snow, or ice cleared regularly.  Use sand or salt on walking paths during winter.  Clean up any spills in your garage right away. This includes oil or grease spills. What can I do in the  bathroom?  Use night lights.  Install grab bars by the toilet and in the tub and shower. Do not use towel bars as grab bars.  Use non-skid mats or decals in the tub or shower.  If you need to sit down in the shower, use a plastic, non-slip stool.  Keep the floor dry. Clean up any water that spills on the floor as soon as it happens.  Remove soap buildup in the tub or shower regularly.  Attach bath mats securely with double-sided non-slip rug tape.  Do not have throw rugs and other things on the floor that can make you trip. What can I do in the bedroom?  Use night lights.  Make sure that you have a light by your bed that is easy to reach.  Do not use any sheets or blankets that are too big for your bed. They should not hang down onto the floor.  Have a firm chair that has side arms. You can use this for support while you get dressed.  Do not have throw rugs and other things on the floor that can make you trip. What can I do in the kitchen?  Clean up any spills right away.  Avoid walking on wet floors.  Keep items that  you use a lot in easy-to-reach places.  If you need to reach something above you, use a strong step stool that has a grab bar.  Keep electrical cords out of the way.  Do not use floor polish or wax that makes floors slippery. If you must use wax, use non-skid floor wax.  Do not have throw rugs and other things on the floor that can make you trip. What can I do with my stairs?  Do not leave any items on the stairs.  Make sure that there are handrails on both sides of the stairs and use them. Fix handrails that are broken or loose. Make sure that handrails are as long as the stairways.  Check any carpeting to make sure that it is firmly attached to the stairs. Fix any carpet that is loose or worn.  Avoid having throw rugs at the top or bottom of the stairs. If you do have throw rugs, attach them to the floor with carpet tape.  Make sure that you have a  light switch at the top of the stairs and the bottom of the stairs. If you do not have them, ask someone to add them for you. What else can I do to help prevent falls?  Wear shoes that:  Do not have high heels.  Have rubber bottoms.  Are comfortable and fit you well.  Are closed at the toe. Do not wear sandals.  If you use a stepladder:  Make sure that it is fully opened. Do not climb a closed stepladder.  Make sure that both sides of the stepladder are locked into place.  Ask someone to hold it for you, if possible.  Clearly mark and make sure that you can see:  Any grab bars or handrails.  First and last steps.  Where the edge of each step is.  Use tools that help you move around (mobility aids) if they are needed. These include:  Canes.  Walkers.  Scooters.  Crutches.  Turn on the lights when you go into a dark area. Replace any light bulbs as soon as they burn out.  Set up your furniture so you have a clear path. Avoid moving your furniture around.  If any of your floors are uneven, fix them.  If there are any pets around you, be aware of where they are.  Review your medicines with your doctor. Some medicines can make you feel dizzy. This can increase your chance of falling. Ask your doctor what other things that you can do to help prevent falls. This information is not intended to replace advice given to you by your health care provider. Make sure you discuss any questions you have with your health care provider. Document Released: 04/13/2009 Document Revised: 11/23/2015 Document Reviewed: 07/22/2014 Elsevier Interactive Patient Education  2017 [employer].

## 2017-06-26 ENCOUNTER — Ambulatory Visit (INDEPENDENT_AMBULATORY_CARE_PROVIDER_SITE_OTHER): Payer: Medicare Other | Admitting: Family Medicine

## 2017-06-26 ENCOUNTER — Encounter: Payer: Self-pay | Admitting: Family Medicine

## 2017-06-26 VITALS — BP 120/70 | HR 64 | Ht 71.0 in | Wt 215.0 lb

## 2017-06-26 DIAGNOSIS — I5022 Chronic systolic (congestive) heart failure: Secondary | ICD-10-CM | POA: Diagnosis not present

## 2017-06-26 DIAGNOSIS — Z1211 Encounter for screening for malignant neoplasm of colon: Secondary | ICD-10-CM

## 2017-06-26 DIAGNOSIS — R0601 Orthopnea: Secondary | ICD-10-CM | POA: Diagnosis not present

## 2017-06-26 NOTE — Progress Notes (Signed)
Name: Larry Lawrence   MRN: [id number]    DOB: [birth year]   Date:06/26/2017       Progress Note  Subjective  Chief Complaint  Chief Complaint  Patient presents with  . Follow-up    cough- got better    Congestive Heart Failure  Presents for follow-up visit. Associated symptoms include orthopnea. Pertinent negatives include no abdominal pain, chest pain, chest pressure, claudication, edema, fatigue, muscle weakness, near-syncope, nocturia, palpitations, paroxysmal nocturnal dyspnea, shortness of breath or unexpected weight change. The symptoms have been resolved. Side effects of treatment include headaches.    No problem-specific Assessment & Plan notes found for this encounter.   Past Medical History:  Diagnosis Date  . Allergy   . CHF (congestive heart failure) (HCC)   . Coronary artery disease   . Gout   . Insomnia   . Renal disorder   . Sleeps in sitting position due to orthopnea   . TIA (transient ischemic attack)     Past Surgical History:  Procedure Laterality Date  . CARDIAC CATHETERIZATION N/A 02/02/2016   Procedure: Left Heart Cath and Coronary Angiography;  Surgeon: Lamar Blinks, MD;  Location: [facility] INVASIVE CV [facility];  Service: Cardiovascular;  Laterality: N/A;  . CARDIAC SURGERY     3 stents in "main artery"  . JOINT REPLACEMENT      Family History  Problem Relation Age of Onset  . Hypertension Mother   . Pancreatic cancer Mother   . Hypertension Father   . Prostate cancer Father     Social History   Socioeconomic History  . Marital status: Married    Spouse name: Not on file  . Number of children: 2  . Years of education: Not on file  . Highest education level: 12th grade  Social Needs  . Financial resource strain: Not hard at all  . Food insecurity - worry: Never true  . Food insecurity - inability: Never true  . Transportation needs - medical: No  . Transportation needs - non-medical: No  Occupational History  . Occupation: retired   Tobacco Use  . Smoking status: Former Smoker    Packs/day: 2.00    Years: 3.00    Pack years: 6.00    Types: Cigarettes    Last attempt to quit: 1960    Years since quitting: 59.0  . Smokeless tobacco: Never Used  . Tobacco comment: Smoking cessation materials not required  Substance and Sexual Activity  . Alcohol use: No  . Drug use: No  . Sexual activity: Not Currently  Other Topics Concern  . Not on file  Social History Narrative  . Not on file    Allergies  Allergen Reactions  . Niaspan [Niacin Er] Other (See Comments)    Reaction:  Hot flashes     Outpatient Medications Prior to Visit  Medication Sig Dispense Refill  . allopurinol (ZYLOPRIM) 300 MG tablet TAKE (1) TABLET BY MOUTH TWICE DAILY 60 tablet 11  . amiodarone (PACERONE) 200 MG tablet Take 200 mg by mouth daily. Dr Gwen Pounds    . carvedilol (COREG) 25 MG tablet Take 25 mg by mouth 2 (two) times daily with a meal. Dr Gwen Pounds    . furosemide (LASIX) 40 MG tablet Take 1 tablet (40 mg total) by mouth 2 (two) times daily. Take 2 tabs in the morning (Patient taking differently: Take 40 mg by mouth 2 (two) times daily. Take 2 tabs in the morning/ Dr Gwen Pounds) 30 tablet   . hydrALAZINE (APRESOLINE)  25 MG tablet Take 1 tablet (25 mg total) by mouth every 8 (eight) hours. (Patient taking differently: Take 25 mg by mouth every 8 (eight) hours. Dr Gwen PoundsKowalski)    . isosorbide mononitrate (IMDUR) 30 MG 24 hr tablet Take 30 mg by mouth daily. Dr Gwen PoundsKowalski    . loratadine (CLARITIN) 10 MG tablet Take 1 tablet (10 mg total) by mouth daily. (Patient taking differently: Take 10 mg by mouth daily as needed for allergies. ) 30 tablet 5  . montelukast (SINGULAIR) 10 MG tablet Take 1 tablet (10 mg total) by mouth at bedtime. 30 tablet 3  . potassium chloride SA (K-DUR,KLOR-CON) 20 MEQ tablet Take 1 tablet (20 mEq total) by mouth 3 (three) times daily. (Patient taking differently: Take 20 mEq by mouth 3 (three) times daily. Dr Gwen PoundsKowalski) 90  tablet 5  . sacubitril-valsartan (ENTRESTO) 24-26 MG Take 1 tablet by mouth 2 (two) times daily.     . timolol (TIMOPTIC) 0.5 % ophthalmic solution Place 1 drop into both eyes 2 (two) times daily. Dr Inez PilgrimBrasington     No facility-administered medications prior to visit.     Review of Systems  Constitutional: Negative for chills, fatigue, fever, malaise/fatigue, unexpected weight change and weight loss.  HENT: Negative for ear discharge, ear pain and sore throat.   Eyes: Negative for blurred vision.  Respiratory: Negative for cough, sputum production, shortness of breath and wheezing.   Cardiovascular: Negative for chest pain, palpitations, claudication, leg swelling and near-syncope.  Gastrointestinal: Negative for abdominal pain, blood in stool, constipation, diarrhea, heartburn, melena and nausea.  Genitourinary: Negative for dysuria, frequency, hematuria, nocturia and urgency.  Musculoskeletal: Negative for back pain, joint pain, myalgias, muscle weakness and neck pain.  Skin: Negative for rash.  Neurological: Negative for dizziness, tingling, sensory change, focal weakness and headaches.  Endo/Heme/Allergies: Negative for environmental allergies and polydipsia. Does not bruise/bleed easily.  Psychiatric/Behavioral: Negative for depression and suicidal ideas. The patient is not nervous/anxious and does not have insomnia.      Objective  Vitals:   06/26/17 1352  BP: 120/70  Pulse: 64  Weight: 215 lb (97.5 kg)  Height: 5\' 11"  (1.803 m)    Physical Exam  Constitutional: He is oriented to person, place, and time and well-developed, well-nourished, and in no distress.  HENT:  Head: Normocephalic.  Right Ear: External ear normal.  Left Ear: External ear normal.  Nose: Nose normal.  Mouth/Throat: Oropharynx is clear and moist.  Eyes: Conjunctivae and EOM are normal. Pupils are equal, round, and reactive to light. Right eye exhibits no discharge. Left eye exhibits no discharge. No  scleral icterus.  Neck: Normal range of motion. Neck supple. Normal carotid pulses, no hepatojugular reflux and no JVD present. Carotid bruit is not present. No tracheal deviation present. No thyromegaly present.  Cardiovascular: Normal rate, regular rhythm, normal heart sounds and intact distal pulses. Exam reveals no gallop and no friction rub.  No murmur heard. Pulmonary/Chest: Breath sounds normal. No respiratory distress. He has no wheezes. He has no rales.  Abdominal: Soft. Bowel sounds are normal. He exhibits no mass. There is no hepatosplenomegaly. There is no tenderness. There is no rebound, no guarding and no CVA tenderness.  Musculoskeletal: Normal range of motion. He exhibits no edema or tenderness.  Lymphadenopathy:    He has no cervical adenopathy.  Neurological: He is alert and oriented to person, place, and time. He has normal sensation, normal strength, normal reflexes and intact cranial nerves. No cranial nerve deficit.  Skin: Skin is warm. No rash noted.  Psychiatric: Mood and affect normal.  Nursing note and vitals reviewed.     Assessment & Plan  Problem List Items Addressed This Visit      Cardiovascular and Mediastinum   Chronic systolic CHF (congestive heart failure) (HCC) - Primary    Other Visit Diagnoses    Sleeps in sitting position due to orthopnea       suggest use hosp bed   Colon cancer screening       Relevant Orders   Ambulatory referral to Gastroenterology      No orders of the defined types were placed in this encounter.     Dr. Hayden Rasmusseneanna Shamika Pedregon [facility]  06/26/17

## 2017-08-12 ENCOUNTER — Other Ambulatory Visit: Payer: Self-pay

## 2017-09-08 DIAGNOSIS — I5022 Chronic systolic (congestive) heart failure: Secondary | ICD-10-CM | POA: Diagnosis not present

## 2017-09-08 DIAGNOSIS — I10 Essential (primary) hypertension: Secondary | ICD-10-CM | POA: Diagnosis not present

## 2017-09-08 DIAGNOSIS — R001 Bradycardia, unspecified: Secondary | ICD-10-CM | POA: Diagnosis not present

## 2017-09-08 DIAGNOSIS — I2510 Atherosclerotic heart disease of native coronary artery without angina pectoris: Secondary | ICD-10-CM | POA: Diagnosis not present

## 2017-09-08 DIAGNOSIS — N289 Disorder of kidney and ureter, unspecified: Secondary | ICD-10-CM | POA: Diagnosis not present

## 2017-09-29 ENCOUNTER — Encounter: Payer: Self-pay | Admitting: Family Medicine

## 2017-09-29 ENCOUNTER — Ambulatory Visit (INDEPENDENT_AMBULATORY_CARE_PROVIDER_SITE_OTHER): Payer: Medicare Other | Admitting: Family Medicine

## 2017-09-29 VITALS — BP 120/70 | HR 64 | Temp 97.9°F | Ht 71.0 in | Wt 214.0 lb

## 2017-09-29 DIAGNOSIS — J302 Other seasonal allergic rhinitis: Secondary | ICD-10-CM

## 2017-09-29 DIAGNOSIS — J309 Allergic rhinitis, unspecified: Secondary | ICD-10-CM

## 2017-09-29 MED ORDER — MONTELUKAST SODIUM 10 MG PO TABS: 10.0000 mg | ORAL_TABLET | Freq: Every day | ORAL | 6 refills | Status: DC

## 2017-09-29 MED ORDER — MOMETASONE FUROATE 50 MCG/ACT NA SUSP: 2.0000 | Freq: Every day | NASAL | 12 refills | Status: DC

## 2017-09-29 NOTE — Progress Notes (Signed)
Name: Larry Lawrence   MRN: [id number]    DOB: [birth year]   Date:09/29/2017       Progress Note  Subjective  Chief Complaint  Chief Complaint  Patient presents with  . Cough    non productive cough, cong, sneezing    Cough  This is a new problem. The current episode started in the past 7 days (Saturday). The problem has been waxing and waning (cough). The problem occurs every few minutes. The cough is non-productive. Associated symptoms include rhinorrhea. Pertinent negatives include no chest pain, chills, ear congestion, ear pain, fever, headaches, heartburn, hemoptysis, myalgias, nasal congestion, postnasal drip, rash, sore throat, shortness of breath, sweats, weight loss or wheezing. The symptoms are aggravated by pollens. He has tried nothing for the symptoms. The treatment provided moderate relief. There is no history of environmental allergies.    No problem-specific Assessment & Plan notes found for this encounter.   Past Medical History:  Diagnosis Date  . Allergy   . CHF (congestive heart failure) (HCC)   . Coronary artery disease   . Gout   . Insomnia   . Renal disorder   . Sleeps in sitting position due to orthopnea   . TIA (transient ischemic attack)     Past Surgical History:  Procedure Laterality Date  . CARDIAC CATHETERIZATION N/A 02/02/2016   Procedure: Left Heart Cath and Coronary Angiography;  Surgeon: Lamar Blinks, MD;  Location: [facility] INVASIVE CV LAB;  Service: Cardiovascular;  Laterality: N/A;  . CARDIAC SURGERY     3 stents in "main artery"  . JOINT REPLACEMENT      Family History  Problem Relation Age of Onset  . Hypertension Mother   . Pancreatic cancer Mother   . Hypertension Father   . Prostate cancer Father     Social History   Socioeconomic History  . Marital status: Married    Spouse name: Not on file  . Number of children: 2  . Years of education: Not on file  . Highest education level: 12th grade  Occupational History  .  Occupation: retired  Engineer, production  . Financial resource strain: Not hard at all  . Food insecurity:    Worry: Never true    Inability: Never true  . Transportation needs:    Medical: No    Non-medical: No  Tobacco Use  . Smoking status: Former Smoker    Packs/day: 2.00    Years: 3.00    Pack years: 6.00    Types: Cigarettes    Last attempt to quit: 1960    Years since quitting: 59.2  . Smokeless tobacco: Never Used  . Tobacco comment: Smoking cessation materials not required  Substance and Sexual Activity  . Alcohol use: No  . Drug use: No  . Sexual activity: Not Currently  Lifestyle  . Physical activity:    Days per week: 0 days    Minutes per session: 0 min  . Stress: Not at all  Relationships  . Social connections:    Talks on phone: Twice a week    Gets together: Once a week    Attends religious service: More than 4 times per year    Active member of club or organization: No    Attends meetings of clubs or organizations: Never    Relationship status: Married  . Intimate partner violence:    Fear of current or ex partner: No    Emotionally abused: No    Physically abused: No  Forced sexual activity: No  Other Topics Concern  . Not on file  Social History Narrative  . Not on file    Allergies  Allergen Reactions  . Niaspan [Niacin Er] Other (See Comments)    Reaction:  Hot flashes     Outpatient Medications Prior to Visit  Medication Sig Dispense Refill  . allopurinol (ZYLOPRIM) 300 MG tablet TAKE (1) TABLET BY MOUTH TWICE DAILY 60 tablet 11  . amiodarone (PACERONE) 200 MG tablet Take 200 mg by mouth daily. Dr Gwen Pounds    . carvedilol (COREG) 25 MG tablet Take 25 mg by mouth 2 (two) times daily with a meal. Dr Gwen Pounds    . furosemide (LASIX) 40 MG tablet Take 1 tablet (40 mg total) by mouth 2 (two) times daily. Take 2 tabs in the morning (Patient taking differently: Take 40 mg by mouth 2 (two) times daily. Take 2 tabs in the morning/ Dr Gwen Pounds) 30  tablet   . hydrALAZINE (APRESOLINE) 25 MG tablet Take 1 tablet (25 mg total) by mouth every 8 (eight) hours. (Patient taking differently: Take 50 mg by mouth every 8 (eight) hours. )    . isosorbide mononitrate (IMDUR) 30 MG 24 hr tablet Take 30 mg by mouth daily. Dr Gwen Pounds    . loratadine (CLARITIN) 10 MG tablet Take 1 tablet (10 mg total) by mouth daily. (Patient taking differently: Take 10 mg by mouth daily as needed for allergies. ) 30 tablet 5  . potassium chloride SA (K-DUR,KLOR-CON) 20 MEQ tablet Take 1 tablet (20 mEq total) by mouth 3 (three) times daily. (Patient taking differently: Take 20 mEq by mouth 3 (three) times daily. Dr Gwen Pounds) 90 tablet 5  . sacubitril-valsartan (ENTRESTO) 24-26 MG Take 1 tablet by mouth 2 (two) times daily.     . timolol (TIMOPTIC) 0.5 % ophthalmic solution Place 1 drop into both eyes 2 (two) times daily. Dr Inez Pilgrim    . montelukast (SINGULAIR) 10 MG tablet Take 1 tablet (10 mg total) by mouth at bedtime. 30 tablet 3   No facility-administered medications prior to visit.     Review of Systems  Constitutional: Negative for chills, fever, malaise/fatigue and weight loss.  HENT: Positive for congestion and rhinorrhea. Negative for ear discharge, ear pain, hearing loss, nosebleeds, postnasal drip, sinus pain, sore throat and tinnitus.   Eyes: Negative for blurred vision.  Respiratory: Positive for cough. Negative for hemoptysis, sputum production, shortness of breath, wheezing and stridor.   Cardiovascular: Negative for chest pain, palpitations and leg swelling.  Gastrointestinal: Negative for abdominal pain, blood in stool, constipation, diarrhea, heartburn, melena and nausea.  Genitourinary: Negative for dysuria, frequency, hematuria and urgency.  Musculoskeletal: Negative for back pain, joint pain, myalgias and neck pain.  Skin: Negative for itching and rash.  Neurological: Negative for dizziness, tingling, sensory change, focal weakness and headaches.   Endo/Heme/Allergies: Negative for environmental allergies and polydipsia. Does not bruise/bleed easily.  Psychiatric/Behavioral: Negative for depression and suicidal ideas. The patient is not nervous/anxious and does not have insomnia.      Objective  Vitals:   09/29/17 1104  BP: 120/70  Pulse: 64  Temp: 97.9 F (36.6 C)  TempSrc: Oral  SpO2: 99%  Weight: 214 lb (97.1 kg)  Height: 5\' 11"  (1.803 m)    Physical Exam  Constitutional: He is oriented to person, place, and time and well-developed, well-nourished, and in no distress.  HENT:  Head: Normocephalic.  Right Ear: External ear normal.  Left Ear: External ear normal.  Nose: Nose normal.  Mouth/Throat: Oropharynx is clear and moist.  Eyes: Pupils are equal, round, and reactive to light. Conjunctivae and EOM are normal. Right eye exhibits no discharge. Left eye exhibits no discharge. No scleral icterus.  Neck: Normal range of motion. Neck supple. No JVD present. No tracheal deviation present. No thyromegaly present.  Cardiovascular: Normal rate, regular rhythm, S1 normal, S2 normal, normal heart sounds, intact distal pulses and normal pulses. PMI is not displaced. Exam reveals no gallop, no S3, no S4, no friction rub and no decreased pulses.  No murmur heard. Pulmonary/Chest: Breath sounds normal. No respiratory distress. He has no wheezes. He has no rales.  Abdominal: Soft. Bowel sounds are normal. He exhibits no mass. There is no hepatosplenomegaly. There is no tenderness. There is no rebound, no guarding and no CVA tenderness.  Musculoskeletal: Normal range of motion. He exhibits no edema or tenderness.  Lymphadenopathy:    He has no cervical adenopathy.  Neurological: He is alert and oriented to person, place, and time. He has normal sensation, normal strength, normal reflexes and intact cranial nerves. No cranial nerve deficit.  Skin: Skin is warm. No rash noted.  Psychiatric: Mood and affect normal.  Nursing note and  vitals reviewed.     Assessment & Plan  Problem List Items Addressed This Visit    None    Visit Diagnoses    Chronic seasonal allergic rhinitis    -  Primary   Relevant Medications   mometasone (NASONEX) 50 MCG/ACT nasal spray   montelukast (SINGULAIR) 10 MG tablet   Allergic rhinitis, unspecified seasonality, unspecified trigger       Relevant Medications   montelukast (SINGULAIR) 10 MG tablet      Meds ordered this encounter  Medications  . mometasone (NASONEX) 50 MCG/ACT nasal spray    Sig: Place 2 sprays into the nose daily.    Dispense:  17 g    Refill:  12  . montelukast (SINGULAIR) 10 MG tablet    Sig: Take 1 tablet (10 mg total) by mouth at bedtime.    Dispense:  30 tablet    Refill:  6      Dr. Elizabeth Sauereanna [facility]el [facility]  09/29/17

## 2017-11-12 DIAGNOSIS — L03113 Cellulitis of right upper limb: Secondary | ICD-10-CM | POA: Diagnosis not present

## 2017-11-12 DIAGNOSIS — S51801A Unspecified open wound of right forearm, initial encounter: Secondary | ICD-10-CM | POA: Diagnosis not present

## 2017-12-19 ENCOUNTER — Emergency Department: Payer: Medicare Other

## 2017-12-19 ENCOUNTER — Emergency Department
Admission: EM | Admit: 2017-12-19 | Discharge: 2017-12-19 | Disposition: A | Discharge status: Home / Self Care | Payer: Medicare Other | Attending: Emergency Medicine | Admitting: Emergency Medicine

## 2017-12-19 DIAGNOSIS — Z79899 Other long term (current) drug therapy: Secondary | ICD-10-CM | POA: Diagnosis not present

## 2017-12-19 DIAGNOSIS — Z8673 Personal history of transient ischemic attack (TIA), and cerebral infarction without residual deficits: Secondary | ICD-10-CM | POA: Insufficient documentation

## 2017-12-19 DIAGNOSIS — Z87891 Personal history of nicotine dependence: Secondary | ICD-10-CM | POA: Insufficient documentation

## 2017-12-19 DIAGNOSIS — I509 Heart failure, unspecified: Secondary | ICD-10-CM | POA: Insufficient documentation

## 2017-12-19 DIAGNOSIS — R55 Syncope and collapse: Secondary | ICD-10-CM | POA: Insufficient documentation

## 2017-12-19 DIAGNOSIS — N189 Chronic kidney disease, unspecified: Secondary | ICD-10-CM | POA: Diagnosis not present

## 2017-12-19 DIAGNOSIS — J984 Other disorders of lung: Secondary | ICD-10-CM | POA: Diagnosis not present

## 2017-12-19 DIAGNOSIS — I2510 Atherosclerotic heart disease of native coronary artery without angina pectoris: Secondary | ICD-10-CM | POA: Diagnosis not present

## 2017-12-19 LAB — CBC WITH DIFFERENTIAL/PLATELET
BAND NEUTROPHILS: 0 %
BASOS PCT: 1 %
Basophils Absolute: 0.1 10*3/uL (ref 0–0.1)
Blasts: 0 %
EOS ABS: 1.1 10*3/uL — AB (ref 0–0.7)
EOS PCT: 16 %
HCT: 41.5 % (ref 40.0–52.0)
Hemoglobin: 14.4 g/dL (ref 13.0–18.0)
LYMPHS ABS: 1.8 10*3/uL (ref 1.0–3.6)
LYMPHS PCT: 27 %
MCH: 31.8 pg (ref 26.0–34.0)
MCHC: 34.8 g/dL (ref 32.0–36.0)
MCV: 91.5 fL (ref 80.0–100.0)
MONO ABS: 0.2 10*3/uL (ref 0.2–1.0)
MYELOCYTES: 0 %
Metamyelocytes Relative: 0 %
Monocytes Relative: 3 %
NEUTROS PCT: 53 %
NRBC: 0 /100{WBCs}
Neutro Abs: 3.5 10*3/uL (ref 1.4–6.5)
OTHER: 0 %
PLATELETS: 270 10*3/uL (ref 150–440)
Promyelocytes Relative: 0 %
RBC: 4.54 MIL/uL (ref 4.40–5.90)
RDW: 18.4 % — ABNORMAL HIGH (ref 11.5–14.5)
WBC: 6.7 10*3/uL (ref 3.8–10.6)

## 2017-12-19 LAB — BASIC METABOLIC PANEL
Anion gap: 7 (ref 5–15)
BUN: 13 mg/dL (ref 6–20)
CALCIUM: 8.8 mg/dL — AB (ref 8.9–10.3)
CO2: 23 mmol/L (ref 22–32)
CREATININE: 1.54 mg/dL — AB (ref 0.61–1.24)
Chloride: 108 mmol/L (ref 101–111)
GFR calc Af Amer: 49 mL/min — ABNORMAL LOW (ref >60)
GFR, EST NON AFRICAN AMERICAN: 42 mL/min — AB (ref >60)
Glucose, Bld: 147 mg/dL — ABNORMAL HIGH (ref 65–99)
Potassium: 4.2 mmol/L (ref 3.5–5.1)
SODIUM: 138 mmol/L (ref 135–145)

## 2017-12-19 LAB — GLUCOSE, CAPILLARY: Glucose-Capillary: 130 mg/dL — ABNORMAL HIGH (ref 65–99)

## 2017-12-19 LAB — TROPONIN I: Troponin I: <0.03 ng/mL (ref <0.03)

## 2017-12-19 MED ORDER — SODIUM CHLORIDE 0.9 % IV BOLUS: 1000.0000 mL | Freq: Once | INTRAVENOUS | Status: DC

## 2017-12-19 NOTE — ED Triage Notes (Addendum)
Pt was a medical emergency room 114. Pt states he got dizzy and a black tunnel came over him. Pt states he doesn't remember much at all. Pt is Alert pt daughter is at bedside. PT states L leg is always swollen more than right. PT has hx of CHF. Pt had a mini stroke in 1999.

## 2017-12-19 NOTE — ED Notes (Signed)
2nd Trop sent at this time -

## 2017-12-19 NOTE — Progress Notes (Addendum)
Was notified by [facility] RN on 1C patient was unresponsive to verbal stimuli, had HR in 30s. On arrival to room patient was in wheelchair, verbally responsive. Aldona Lento SWOT RN present, said patient had become unresponsive for a brief period. Dr. Corky Downs responded to Emergency Alert. Was transporting to ED, was met by Romie Minus ED RN, patient transferred to stretcher and taken to ED.

## 2017-12-19 NOTE — ED Provider Notes (Signed)
[facility] Emergency Department Provider Note  ____________________________________________  Time seen: Approximately 2:02 PM  I have reviewed the triage vital signs and the nursing notes.   HISTORY  Chief Complaint Loss of Consciousness   HPI Larry Lawrence is a 77 y.o. male with a history of CHF with a EF of 50 to 55%, CAD, TIA, CKD who presents for evaluation of syncopal episode.  Patient was in the hospital visiting his wife who is admitted for complications associated with breast cancer.  He was sitting in a chair in her room when he started feeling dizzy and started having tingling and numbness of his hands and had a syncopal event.  Patient did not fall from the chair.  He was unresponsive for a few seconds.  An emergency code was called in the hospital and patient was brought to the emergency room for evaluation.  Upon regaining consciousness patient was back to his baseline, no seizure-like activity, no postictal phase, no urinary or bowel incontinence.  Patient reports that he has not had anything to eat today which is atypical for him.  He has taken his medications.  He denies having headache, chest pain, shortness of breath, abdominal pain, back pain, palpitations preceding the syncopal event or now.  No fever, no nausea, no vomiting, no dysuria.  No unilateral weakness or numbness, slurred speech, facial droop.  Past Medical History:  Diagnosis Date  . Allergy   . CHF (congestive heart failure) (HCC)   . Coronary artery disease   . Gout   . Insomnia   . Renal disorder   . Sleeps in sitting position due to orthopnea   . TIA (transient ischemic attack)     Patient Active Problem List   Diagnosis Date Noted  . Near syncope 04/24/2017  . Bradycardia 04/24/2017  . Gout 02/01/2016  . Syncope 08/09/2015  . Hypokalemia 08/09/2015  . CKD (chronic kidney disease), stage III (HCC) 02/20/2015  . Chronic systolic CHF (congestive heart failure) (HCC)  02/20/2015  . CKD (chronic kidney disease) 02/20/2015    Past Surgical History:  Procedure Laterality Date  . CARDIAC CATHETERIZATION N/A 02/02/2016   Procedure: Left Heart Cath and Coronary Angiography;  Surgeon: Lamar Blinks, MD;  Location: [facility] INVASIVE CV LAB;  Service: Cardiovascular;  Laterality: N/A;  . CARDIAC SURGERY     3 stents in "main artery"  . JOINT REPLACEMENT      Prior to Admission medications   Medication Sig Start Date End Date Taking? Authorizing Provider  allopurinol (ZYLOPRIM) 300 MG tablet TAKE (1) TABLET BY MOUTH TWICE DAILY 04/15/17   Duanne Limerick, MD  amiodarone (PACERONE) 200 MG tablet Take 200 mg by mouth daily. Dr Gwen Pounds    [provider]  carvedilol (COREG) 25 MG tablet Take 25 mg by mouth 2 (two) times daily with a meal. Dr Gwen Pounds    [provider]  furosemide (LASIX) 40 MG tablet Take 1 tablet (40 mg total) by mouth 2 (two) times daily. Take 2 tabs in the morning Patient taking differently: Take 40 mg by mouth 2 (two) times daily. Take 2 tabs in the morning/ Dr Gwen Pounds 02/05/16   Alford Highland, MD  hydrALAZINE (APRESOLINE) 25 MG tablet Take 1 tablet (25 mg total) by mouth every 8 (eight) hours. Patient taking differently: Take 50 mg by mouth every 8 (eight) hours.  04/25/17   Auburn Bilberry, MD  isosorbide mononitrate (IMDUR) 30 MG 24 hr tablet Take 30 mg by mouth  daily. Dr Gwen PoundsKowalski    [provider]  loratadine (CLARITIN) 10 MG tablet Take 1 tablet (10 mg total) by mouth daily. Patient taking differently: Take 10 mg by mouth daily as needed for allergies.  08/06/16   Duanne LimerickJones, Deanna C, MD  mometasone (NASONEX) 50 MCG/ACT nasal spray Place 2 sprays into the nose daily. 09/29/17   Duanne LimerickJones, Deanna C, MD  montelukast (SINGULAIR) 10 MG tablet Take 1 tablet (10 mg total) by mouth at bedtime. 09/29/17   Duanne LimerickJones, Deanna C, MD  potassium chloride SA (K-DUR,KLOR-CON) 20 MEQ tablet Take 1 tablet (20 mEq total) by mouth 3 (three) times  daily. Patient taking differently: Take 20 mEq by mouth 3 (three) times daily. Dr Gwen PoundsKowalski 08/06/16   Duanne LimerickJones, Deanna C, MD  sacubitril-valsartan (ENTRESTO) 24-26 MG Take 1 tablet by mouth 2 (two) times daily.  03/31/17   [provider]  timolol (TIMOPTIC) 0.5 % ophthalmic solution Place 1 drop into both eyes 2 (two) times daily. Dr Inez PilgrimBrasington    [provider]    Allergies Niaspan [niacin er]  Family History  Problem Relation Age of Onset  . Hypertension Mother   . Pancreatic cancer Mother   . Hypertension Father   . Prostate cancer Father     Social History Social History   Tobacco Use  . Smoking status: Former Smoker    Packs/day: 2.00    Years: 3.00    Pack years: 6.00    Types: Cigarettes    Last attempt to quit: 1960    Years since quitting: 59.5  . Smokeless tobacco: Never Used  . Tobacco comment: Smoking cessation materials not required  Substance Use Topics  . Alcohol use: No  . Drug use: No    Review of Systems  Constitutional: Negative for fever. + syncope Eyes: Negative for visual changes. ENT: Negative for sore throat. Neck: No neck pain  Cardiovascular: Negative for chest pain. Respiratory: Negative for shortness of breath. Gastrointestinal: Negative for abdominal pain, vomiting or diarrhea. Genitourinary: Negative for dysuria. Musculoskeletal: Negative for back pain. Skin: Negative for rash. Neurological: Negative for headaches, weakness or numbness. Psych: No SI or HI  ____________________________________________   PHYSICAL EXAM:  VITAL SIGNS: ED Triage Vitals  Enc Vitals Group     BP 12/19/17 1303 (!) 152/67     Pulse Rate 12/19/17 1303 (!) 52     Resp 12/19/17 1303 19     Temp 12/19/17 1303 98.7 F (37.1 C)     Temp Source 12/19/17 1303 Oral     SpO2 12/19/17 1303 98 %     Weight 12/19/17 1304 195 lb (88.5 kg)     Height 12/19/17 1304 6\' 2"  (1.88 m)     Head Circumference --      Peak Flow --      Pain Score  12/19/17 1304 0     Pain Loc --      Pain Edu? --      Excl. in GC? --     Constitutional: Alert and oriented. Well appearing and in no apparent distress. HEENT:      Head: Normocephalic and atraumatic.         Eyes: Conjunctivae are normal. Sclera is non-icteric.       Mouth/Throat: Mucous membranes are moist.       Neck: Supple with no signs of meningismus. Cardiovascular: Regular rate and rhythm. No murmurs, gallops, or rubs. 2+ symmetrical distal pulses are present in all extremities. No JVD. Respiratory: Normal  respiratory effort. Lungs are clear to auscultation bilaterally. No wheezes, crackles, or rhonchi.  Gastrointestinal: Soft, non tender, and non distended with positive bowel sounds. No rebound or guarding. Musculoskeletal: Nontender with normal range of motion in all extremities. No edema, cyanosis, or erythema of extremities. Neurologic: Normal speech and language. A & O x3, PERRL, EOMI, no nystagmus, CN II-XII intact, motor testing reveals good tone and bulk throughout. There is no evidence of pronator drift or dysmetria. Muscle strength is 5/5 throughout. Deep tendon reflexes are 2+ throughout with downgoing toes. Sensory examination is intact. Gait is normal. Skin: Skin is warm, dry and intact. No rash noted. Psychiatric: Mood and affect are normal. Speech and behavior are normal.  ____________________________________________   LABS (all labs ordered are listed, but only abnormal results are displayed)  Labs Reviewed  GLUCOSE, CAPILLARY - Abnormal; Notable for the following components:      Result Value   Glucose-Capillary 130 (*)    All other components within normal limits  CBC WITH DIFFERENTIAL/PLATELET  BASIC METABOLIC PANEL  TROPONIN I  URINALYSIS, COMPLETE (UACMP) WITH MICROSCOPIC   ____________________________________________  EKG  ED ECG REPORT I, Nita Sickle, the attending physician, personally viewed and interpreted this ECG.  Sinus  bradycardia with 1st degree AV block, LBBB, borderline QTc prolongation, normal axis, Q waves in anterior leads, no STE or depressions, no evidence of HOCM, AV block, delta wave, ARVD, prolonged QTc, WPW, or Brugada.  EKG is unchanged from prior from 2018.   ____________________________________________  RADIOLOGY  I have personally reviewed the images performed during this visit and I agree with the Radiologist's read.   Interpretation by Radiologist:  Dg Chest 2 View  Result Date: 12/19/2017 CLINICAL DATA:  Syncope EXAM: CHEST - 2 VIEW COMPARISON:  February 01, 2016 FINDINGS: There is interstitial thickening, likely due to chronic bronchitis. There is mild scarring in the left lower lobe. There is no edema or consolidation. Heart is upper normal in size with pulmonary vascularity normal. There is aortic atherosclerosis. There is coronary artery calcification in a portion of the left anterior descending coronary artery. No adenopathy. No bone lesions. IMPRESSION: Suspect chronic bronchitis. Mild scarring left lower lung zone. No edema or consolidation. Stable cardiac silhouette. There is coronary artery calcification as well as aortic atherosclerosis. Aortic Atherosclerosis (ICD10-I70.0). Electronically Signed   By: Bretta Bang III M.D.   On: 12/19/2017 14:28      ____________________________________________   PROCEDURES  Procedure(s) performed: None Procedures Critical Care performed:  None ____________________________________________   INITIAL IMPRESSION / ASSESSMENT AND PLAN / ED COURSE   77 y.o. male with a history of CHF with a EF of 50 to 55%, CAD, TIA, CKD who presents for evaluation of syncopal episode while sitting in his wife's hospital room. No trauma.  Patient reports feeling dizzy prior to the syncopal event but no other complaints.  He is completely neurologically intact.  He has no chest pain and his EKG is unchanged from baseline.  He does not look volume overloaded on  exam.  His vitals are within normal limits.  Patient reports not eating anything this morning which could have contributed to his syncopal event.  Blood glucose was normal.  Will check labs to rule out dehydration, electrolyte abnormalities, or acute kidney injury.  Will monitor on telemetry for any evidence of cardiac arrhythmias.  Will avoid fluids since patient has a history of CHF.  Will provide patient with a meal.  Clinical Course as of Dec 19 1540  Fri Dec 19, 2017  1537 Patient remains well appearing. No arrhythmias on telemetry. Labs pending. Given snack in the ED. Plan to f/u labs and reassess. Care transferred to Dr. Lenard Lance   [CV]    Clinical Course User Index [CV] Don Perking [place], MD     As part of my medical decision making, I reviewed the following data within the electronic MEDICAL RECORD NUMBER History obtained from family, Nursing notes reviewed and incorporated, EKG interpreted , Old chart reviewed, Radiograph reviewed , Notes from prior ED visits and [place] Controlled Substance Database    Pertinent labs & imaging results that were available during my care of the patient were reviewed by me and considered in my medical decision making (see chart for details).    ____________________________________________   FINAL CLINICAL IMPRESSION(S) / ED DIAGNOSES  Final diagnoses:  Syncope, unspecified syncope type      NEW MEDICATIONS STARTED DURING THIS VISIT:  ED Discharge Orders    None       Note:  This document was prepared using Dragon voice recognition software and may include unintentional dictation errors.    Don Perking, [place], MD 12/19/17 [phone]

## 2017-12-19 NOTE — ED Provider Notes (Signed)
-----------------------------------------   6:44 PM on 12/19/2017 -----------------------------------------  Patient care assumed from Dr. Michell HeinrichVernice.  Patient's repeat troponin is negative.  Patient continues to appear well with no complaints in the emergency department.  He is ready to be discharged home.  I believe it is safe for the patient to be discharged home with follow-up with his doctor.  Patient has experience syncopal episodes in the past.  Discussed with the patient drink plenty fluids obtain plenty of rest and follow-up with his doctor.  I also discussed return precautions for any further incidents.   Minna AntisPaduchowski, Bethann Qualley, MD 12/19/17 [phone]

## 2017-12-19 NOTE — ED Notes (Signed)
Daughter brought patient hamburger from cafeteria. sb noted at 54/ skin warm and dry. Pt states he feels a lot better now

## 2018-01-05 DIAGNOSIS — I5022 Chronic systolic (congestive) heart failure: Secondary | ICD-10-CM | POA: Diagnosis not present

## 2018-01-05 DIAGNOSIS — I10 Essential (primary) hypertension: Secondary | ICD-10-CM | POA: Diagnosis not present

## 2018-01-05 DIAGNOSIS — R55 Syncope and collapse: Secondary | ICD-10-CM | POA: Diagnosis not present

## 2018-01-05 DIAGNOSIS — I472 Ventricular tachycardia: Secondary | ICD-10-CM | POA: Diagnosis not present

## 2018-01-05 DIAGNOSIS — I2510 Atherosclerotic heart disease of native coronary artery without angina pectoris: Secondary | ICD-10-CM | POA: Diagnosis not present

## 2018-01-12 DIAGNOSIS — R55 Syncope and collapse: Secondary | ICD-10-CM | POA: Diagnosis not present

## 2018-02-17 ENCOUNTER — Ambulatory Visit (INDEPENDENT_AMBULATORY_CARE_PROVIDER_SITE_OTHER): Payer: Medicare Other | Admitting: Family Medicine

## 2018-02-17 ENCOUNTER — Encounter: Payer: Self-pay | Admitting: Family Medicine

## 2018-02-17 VITALS — BP 120/64 | HR 56 | Ht 74.0 in | Wt 215.0 lb

## 2018-02-17 DIAGNOSIS — L03012 Cellulitis of left finger: Secondary | ICD-10-CM

## 2018-02-17 DIAGNOSIS — S6010XA Contusion of unspecified finger with damage to nail, initial encounter: Secondary | ICD-10-CM | POA: Diagnosis not present

## 2018-02-17 DIAGNOSIS — R233 Spontaneous ecchymoses: Secondary | ICD-10-CM

## 2018-02-17 MED ORDER — MUPIROCIN 2 % EX OINT: 1.0000 "application " | TOPICAL_OINTMENT | Freq: Two times a day (BID) | CUTANEOUS | 0 refills | Status: DC

## 2018-02-17 MED ORDER — CEPHALEXIN 500 MG PO CAPS: 500.0000 mg | ORAL_CAPSULE | Freq: Two times a day (BID) | ORAL | 0 refills | Status: DC

## 2018-02-17 NOTE — Progress Notes (Signed)
Name: Larry Lawrence   MRN: [id number]    DOB: 1940-08-13   Date:02/17/2018       Progress Note  Subjective  Chief Complaint  Chief Complaint  Patient presents with  . Hand Pain    middle finger on l) hand - nail is red and throbbing. doesn't recall doing anything to cause it    Hand Pain   The incident occurred more than 1 week ago. There was no injury mechanism. The quality of the pain is described as aching ("sore"). The pain is at a severity of 0/10. The pain has been fluctuating since the incident. Pertinent negatives include no chest pain or tingling. He has tried nothing for the symptoms. The treatment provided mild relief.    No problem-specific Assessment & Plan notes found for this encounter.   Past Medical History:  Diagnosis Date  . Allergy   . CHF (congestive heart failure) (HCC)   . Coronary artery disease   . Gout   . Insomnia   . Renal disorder   . Sleeps in sitting position due to orthopnea   . TIA (transient ischemic attack)     Past Surgical History:  Procedure Laterality Date  . CARDIAC CATHETERIZATION N/A 02/02/2016   Procedure: Left Heart Cath and Coronary Angiography;  Surgeon: Lamar Blinks, MD;  Location: [facility] INVASIVE CV LAB;  Service: Cardiovascular;  Laterality: N/A;  . CARDIAC SURGERY     3 stents in "main artery"  . JOINT REPLACEMENT      Family History  Problem Relation Age of Onset  . Hypertension Mother   . Pancreatic cancer Mother   . Hypertension Father   . Prostate cancer Father     Social History   Socioeconomic History  . Marital status: Married    Spouse name: Not on file  . Number of children: 2  . Years of education: Not on file  . Highest education level: 12th grade  Occupational History  . Occupation: retired  Engineer, production  . Financial resource strain: Not hard at all  . Food insecurity:    Worry: Never true    Inability: Never true  . Transportation needs:    Medical: No    Non-medical: No  Tobacco Use  .  Smoking status: Former Smoker    Packs/day: 2.00    Years: 3.00    Pack years: 6.00    Types: Cigarettes    Last attempt to quit: 1960    Years since quitting: 59.6  . Smokeless tobacco: Never Used  . Tobacco comment: Smoking cessation materials not required  Substance and Sexual Activity  . Alcohol use: No  . Drug use: No  . Sexual activity: Not Currently  Lifestyle  . Physical activity:    Days per week: 0 days    Minutes per session: 0 min  . Stress: Not at all  Relationships  . Social connections:    Talks on phone: Twice a week    Gets together: Once a week    Attends religious service: More than 4 times per year    Active member of club or organization: No    Attends meetings of clubs or organizations: Never    Relationship status: Married  . Intimate partner violence:    Fear of current or ex partner: No    Emotionally abused: No    Physically abused: No    Forced sexual activity: No  Other Topics Concern  . Not on file  Social History  Narrative  . Not on file    Allergies  Allergen Reactions  . Niaspan [Niacin Er] Other (See Comments)    Reaction:  Hot flashes     Outpatient Medications Prior to Visit  Medication Sig Dispense Refill  . allopurinol (ZYLOPRIM) 300 MG tablet TAKE (1) TABLET BY MOUTH TWICE DAILY 60 tablet 11  . amiodarone (PACERONE) 200 MG tablet Take 200 mg by mouth daily. Dr Gwen PoundsKowalski    . atorvastatin (LIPITOR) 20 MG tablet Take 1 tablet by mouth daily. [place]    . carvedilol (COREG) 25 MG tablet Take 25 mg by mouth 2 (two) times daily with a meal. Dr Gwen PoundsKowalski    . furosemide (LASIX) 40 MG tablet Take 1 tablet (40 mg total) by mouth 2 (two) times daily. Take 2 tabs in the morning (Patient taking differently: Take 40 mg by mouth 2 (two) times daily. Take 2 tabs in the morning/ Dr Gwen PoundsKowalski) 30 tablet   . hydrALAZINE (APRESOLINE) 25 MG tablet Take 1 tablet (25 mg total) by mouth every 8 (eight) hours. (Patient taking differently: Take 50 mg by  mouth every 8 (eight) hours. )    . isosorbide mononitrate (IMDUR) 30 MG 24 hr tablet Take 30 mg by mouth daily. Dr Gwen PoundsKowalski    . loratadine (CLARITIN) 10 MG tablet Take 1 tablet (10 mg total) by mouth daily. (Patient taking differently: Take 10 mg by mouth daily as needed for allergies. ) 30 tablet 5  . mometasone (NASONEX) 50 MCG/ACT nasal spray Place 2 sprays into the nose daily. 17 g 12  . montelukast (SINGULAIR) 10 MG tablet Take 1 tablet (10 mg total) by mouth at bedtime. 30 tablet 6  . potassium chloride SA (K-DUR,KLOR-CON) 20 MEQ tablet Take 1 tablet (20 mEq total) by mouth 3 (three) times daily. (Patient taking differently: Take 20 mEq by mouth 3 (three) times daily. Dr Gwen PoundsKowalski) 90 tablet 5  . sacubitril-valsartan (ENTRESTO) 24-26 MG Take 1 tablet by mouth 2 (two) times daily.     . timolol (TIMOPTIC) 0.5 % ophthalmic solution Place 1 drop into both eyes 2 (two) times daily. Dr Inez PilgrimBrasington     No facility-administered medications prior to visit.     Review of Systems  Constitutional: Negative for chills, fever, malaise/fatigue and weight loss.  HENT: Negative for ear discharge, ear pain and sore throat.   Eyes: Negative for blurred vision.  Respiratory: Negative for cough, sputum production, shortness of breath and wheezing.   Cardiovascular: Negative for chest pain, palpitations and leg swelling.  Gastrointestinal: Negative for abdominal pain, blood in stool, constipation, diarrhea, heartburn, melena and nausea.  Genitourinary: Negative for dysuria, frequency, hematuria and urgency.  Musculoskeletal: Negative for back pain, joint pain, myalgias and neck pain.  Skin: Negative for rash.  Neurological: Negative for dizziness, tingling, sensory change, focal weakness and headaches.  Endo/Heme/Allergies: Negative for environmental allergies and polydipsia. Does not bruise/bleed easily.  Psychiatric/Behavioral: Negative for depression and suicidal ideas. The patient is not nervous/anxious  and does not have insomnia.      Objective  Vitals:   02/17/18 0920  BP: 120/64  Pulse: (!) 56  Weight: 215 lb (97.5 kg)  Height: 6\' 2"  (1.88 m)    Physical Exam  Constitutional: He is oriented to person, place, and time.  HENT:  Head: Normocephalic.  Right Ear: External ear normal.  Left Ear: External ear normal.  Nose: Nose normal.  Mouth/Throat: Oropharynx is clear and moist.  Eyes: Pupils are equal, round, and reactive to  light. Conjunctivae and EOM are normal. Right eye exhibits no discharge. Left eye exhibits no discharge. No scleral icterus.  Neck: Normal range of motion. Neck supple. No JVD present. No tracheal deviation present. No thyromegaly present.  Cardiovascular: Normal rate, regular rhythm, normal heart sounds and intact distal pulses. Exam reveals no gallop and no friction rub.  No murmur heard. Pulmonary/Chest: Breath sounds normal. No respiratory distress. He has no wheezes. He has no rales.  Abdominal: Soft. Bowel sounds are normal. He exhibits no mass. There is no hepatosplenomegaly. There is no tenderness. There is no rebound, no guarding and no CVA tenderness.  Musculoskeletal: Normal range of motion. He exhibits no edema or tenderness.  Lymphadenopathy:    He has no cervical adenopathy.  Neurological: He is alert and oriented to person, place, and time. He has normal strength and normal reflexes. No cranial nerve deficit.  Skin: Skin is warm. Ecchymosis noted. No rash noted.  Left middle/fingernail/ sign of previous subungal hematoma/blood released spontaneously proximal . Mild tenderness cuticle  Nursing note and vitals reviewed.     Assessment & Plan  Problem List Items Addressed This Visit    None    Visit Diagnoses    Subungual hematoma of finger of left hand, initial encounter    -  Primary   Resolved. No resididual bleed or fluid/sterile puncture no blood/sreous fluid nor pus.   Paronychia of left middle finger       Superficial/ treat  with bactroban and cephalexin   Relevant Medications   mupirocin ointment (BACTROBAN) 2 %   cephALEXin (KEFLEX) 500 MG capsule   Ecchymoses, spontaneous       Multiple ecchymoses on forearms. Check PT/PTT and CBC(platelets).   Relevant Orders   PT and PTT   CBC with Differential/Platelet      Meds ordered this encounter  Medications  . mupirocin ointment (BACTROBAN) 2 %    Sig: Apply 1 application topically 2 (two) times daily.    Dispense:  22 g    Refill:  0  . cephALEXin (KEFLEX) 500 MG capsule    Sig: Take 1 capsule (500 mg total) by mouth 2 (two) times daily.    Dispense:  17 capsule    Refill:  0      Dr. Elizabeth Sauereanna Jones [facility]  02/17/18

## 2018-02-18 LAB — CBC WITH DIFFERENTIAL/PLATELET
BASOS: 0 %
Basophils Absolute: 0 10*3/uL (ref 0.0–0.2)
EOS (ABSOLUTE): 1.3 10*3/uL — ABNORMAL HIGH (ref 0.0–0.4)
Eos: 19 %
HEMATOCRIT: 36.9 % — AB (ref 37.5–51.0)
Hemoglobin: 11.5 g/dL — ABNORMAL LOW (ref 13.0–17.7)
Immature Grans (Abs): 0 10*3/uL (ref 0.0–0.1)
Immature Granulocytes: 0 %
LYMPHS ABS: 0.9 10*3/uL (ref 0.7–3.1)
Lymphs: 13 %
MCH: 30.1 pg (ref 26.6–33.0)
MCHC: 31.2 g/dL — AB (ref 31.5–35.7)
MCV: 97 fL (ref 79–97)
MONOS ABS: 0.3 10*3/uL (ref 0.1–0.9)
Monocytes: 4 %
NEUTROS ABS: 4.3 10*3/uL (ref 1.4–7.0)
NEUTROS PCT: 64 %
PLATELETS: 188 10*3/uL (ref 150–450)
RBC: 3.82 x10E6/uL — ABNORMAL LOW (ref 4.14–5.80)
RDW: 17 % — AB (ref 12.3–15.4)
WBC: 6.8 10*3/uL (ref 3.4–10.8)

## 2018-02-18 LAB — PT AND PTT
INR: 1.1 (ref 0.8–1.2)
PROTHROMBIN TIME: 11.8 s (ref 9.1–12.0)
aPTT: 39 s — ABNORMAL HIGH (ref 24–33)

## 2018-04-03 ENCOUNTER — Other Ambulatory Visit: Payer: Self-pay | Admitting: Family Medicine

## 2018-04-03 DIAGNOSIS — J309 Allergic rhinitis, unspecified: Secondary | ICD-10-CM

## 2018-04-03 DIAGNOSIS — J302 Other seasonal allergic rhinitis: Secondary | ICD-10-CM

## 2018-04-14 DIAGNOSIS — I2510 Atherosclerotic heart disease of native coronary artery without angina pectoris: Secondary | ICD-10-CM | POA: Diagnosis not present

## 2018-04-14 DIAGNOSIS — I6523 Occlusion and stenosis of bilateral carotid arteries: Secondary | ICD-10-CM | POA: Diagnosis not present

## 2018-04-14 DIAGNOSIS — I10 Essential (primary) hypertension: Secondary | ICD-10-CM | POA: Diagnosis not present

## 2018-04-14 DIAGNOSIS — I5022 Chronic systolic (congestive) heart failure: Secondary | ICD-10-CM | POA: Diagnosis not present

## 2018-04-14 DIAGNOSIS — R55 Syncope and collapse: Secondary | ICD-10-CM | POA: Diagnosis not present

## 2018-05-08 ENCOUNTER — Other Ambulatory Visit: Payer: Self-pay | Admitting: Family Medicine

## 2018-05-08 DIAGNOSIS — M109 Gout, unspecified: Secondary | ICD-10-CM

## 2018-05-28 ENCOUNTER — Other Ambulatory Visit: Payer: Self-pay

## 2018-05-28 ENCOUNTER — Emergency Department: Payer: Medicare Other

## 2018-05-28 ENCOUNTER — Emergency Department
Admission: EM | Admit: 2018-05-28 | Discharge: 2018-05-28 | Disposition: A | Discharge status: Home / Self Care | Payer: Medicare Other | Attending: Emergency Medicine | Admitting: Emergency Medicine

## 2018-05-28 DIAGNOSIS — I509 Heart failure, unspecified: Secondary | ICD-10-CM

## 2018-05-28 DIAGNOSIS — N183 Chronic kidney disease, stage 3 (moderate): Secondary | ICD-10-CM | POA: Diagnosis not present

## 2018-05-28 DIAGNOSIS — Z79899 Other long term (current) drug therapy: Secondary | ICD-10-CM | POA: Insufficient documentation

## 2018-05-28 DIAGNOSIS — I5022 Chronic systolic (congestive) heart failure: Secondary | ICD-10-CM | POA: Diagnosis not present

## 2018-05-28 DIAGNOSIS — I2510 Atherosclerotic heart disease of native coronary artery without angina pectoris: Secondary | ICD-10-CM | POA: Diagnosis not present

## 2018-05-28 DIAGNOSIS — Z955 Presence of coronary angioplasty implant and graft: Secondary | ICD-10-CM | POA: Insufficient documentation

## 2018-05-28 DIAGNOSIS — R0602 Shortness of breath: Secondary | ICD-10-CM | POA: Insufficient documentation

## 2018-05-28 DIAGNOSIS — R609 Edema, unspecified: Secondary | ICD-10-CM

## 2018-05-28 DIAGNOSIS — J90 Pleural effusion, not elsewhere classified: Secondary | ICD-10-CM | POA: Diagnosis not present

## 2018-05-28 DIAGNOSIS — R2243 Localized swelling, mass and lump, lower limb, bilateral: Secondary | ICD-10-CM | POA: Diagnosis present

## 2018-05-28 DIAGNOSIS — Z87891 Personal history of nicotine dependence: Secondary | ICD-10-CM | POA: Insufficient documentation

## 2018-05-28 DIAGNOSIS — R0609 Other forms of dyspnea: Secondary | ICD-10-CM | POA: Diagnosis not present

## 2018-05-28 DIAGNOSIS — R600 Localized edema: Secondary | ICD-10-CM | POA: Diagnosis not present

## 2018-05-28 DIAGNOSIS — Z8673 Personal history of transient ischemic attack (TIA), and cerebral infarction without residual deficits: Secondary | ICD-10-CM | POA: Insufficient documentation

## 2018-05-28 LAB — BASIC METABOLIC PANEL
Anion gap: 7 (ref 5–15)
BUN: 22 mg/dL (ref 8–23)
CO2: 24 mmol/L (ref 22–32)
Calcium: 8.3 mg/dL — ABNORMAL LOW (ref 8.9–10.3)
Chloride: 110 mmol/L (ref 98–111)
Creatinine, Ser: 2.11 mg/dL — ABNORMAL HIGH (ref 0.61–1.24)
GFR, EST AFRICAN AMERICAN: 34 mL/min — AB (ref >60)
GFR, EST NON AFRICAN AMERICAN: 30 mL/min — AB (ref >60)
Glucose, Bld: 113 mg/dL — ABNORMAL HIGH (ref 70–99)
Potassium: 3.6 mmol/L (ref 3.5–5.1)
Sodium: 141 mmol/L (ref 135–145)

## 2018-05-28 LAB — CBC
HCT: 39.1 % (ref 39.0–52.0)
Hemoglobin: 12.4 g/dL — ABNORMAL LOW (ref 13.0–17.0)
MCH: 29.8 pg (ref 26.0–34.0)
MCHC: 31.7 g/dL (ref 30.0–36.0)
MCV: 94 fL (ref 80.0–100.0)
PLATELETS: 181 10*3/uL (ref 150–400)
RBC: 4.16 MIL/uL — ABNORMAL LOW (ref 4.22–5.81)
RDW: 15.7 % — ABNORMAL HIGH (ref 11.5–15.5)
WBC: 6.2 10*3/uL (ref 4.0–10.5)
nRBC: 0 % (ref 0.0–0.2)

## 2018-05-28 LAB — BRAIN NATRIURETIC PEPTIDE: B NATRIURETIC PEPTIDE 5: 2337 pg/mL — AB (ref 0.0–100.0)

## 2018-05-28 MED ORDER — POTASSIUM CHLORIDE CRYS ER 20 MEQ PO TBCR
20.0000 meq | EXTENDED_RELEASE_TABLET | Freq: Once | ORAL | Status: AC
  Administered 2018-05-28: 20 meq via ORAL
  Filled 2018-05-28: qty 1

## 2018-05-28 MED ORDER — TORSEMIDE 20 MG PO TABS: 40.0000 mg | ORAL_TABLET | Freq: Every day | ORAL | 0 refills | Status: DC

## 2018-05-28 MED ORDER — FUROSEMIDE 10 MG/ML IJ SOLN
80.0000 mg | Freq: Once | INTRAMUSCULAR | Status: AC
  Administered 2018-05-28: 80 mg via INTRAVENOUS
  Filled 2018-05-28: qty 8

## 2018-05-28 NOTE — ED Notes (Signed)
To Ultrasound

## 2018-05-28 NOTE — ED Triage Notes (Signed)
Pt arrives to ED via POV from home with c/o bilateral leg swelling x1 week. Pt reports x2 days of SHOB, pt reports h/x of CHF and takes Lasix. Pt's bilateral lower extremities are extremely swollen, weeping, and skin is tight and shiny. Pt is A&O, in NAD; RR even, regular, and unlabored. Dr Fanny BienQuale at bedside upon pt's being taken to ED Rm 8.

## 2018-05-28 NOTE — ED Triage Notes (Signed)
First RN Note: Pt presents with family member, states BLE edema x several days. Pt's family member reports on 1 leg has red wounds that are oozing and on the other has green spots to lower extremity.

## 2018-05-28 NOTE — ED Provider Notes (Signed)
[facility] Emergency Department Provider Note   ____________________________________________   First MD Initiated Contact with Patient 05/28/18 1905     (approximate)  I have reviewed the triage vital signs and the nursing notes.   HISTORY  Chief Complaint Leg Swelling    HPI Larry Lawrence is a 77 y.o. male history of congestive heart failure and coronary artery disease   Patient reports for a few days now has had increased swelling in both legs.  He is noticed that the swelling to the point that they are weeping fluid at times.  He also has a red square on his left lower leg, but reports that the redness showed up in the shape of the Band-Aid that he had placed over it was weeping  But no fevers or chills.  No cough.  A little shortness of breath with walking for about 2 days.  No chest pain.  Takes Lasix and doubled his own home dose of Lasix the last 2 days straight, but reports he continues to feel like he is very swollen.  No shortness of breath with laying down.  Does elevate his legs in the evening.  Similar symptoms due to congestive heart failure.  Sees Dr. Gwen Pounds and has an appointment with his primary doctor on Monday   Past Medical History:  Diagnosis Date  . Allergy   . CHF (congestive heart failure) (HCC)   . Coronary artery disease   . Gout   . Insomnia   . Renal disorder   . Sleeps in sitting position due to orthopnea   . TIA (transient ischemic attack)     Patient Active Problem List   Diagnosis Date Noted  . Near syncope 04/24/2017  . Bradycardia 04/24/2017  . Gout 02/01/2016  . Syncope 08/09/2015  . Hypokalemia 08/09/2015  . CKD (chronic kidney disease), stage III (HCC) 02/20/2015  . Chronic systolic CHF (congestive heart failure) (HCC) 02/20/2015  . CKD (chronic kidney disease) 02/20/2015    Past Surgical History:  Procedure Laterality Date  . CARDIAC CATHETERIZATION N/A 02/02/2016   Procedure: Left Heart Cath and  Coronary Angiography;  Surgeon: Lamar Blinks, MD;  Location: [facility] INVASIVE CV LAB;  Service: Cardiovascular;  Laterality: N/A;  . CARDIAC SURGERY     3 stents in "main artery"  . JOINT REPLACEMENT      Prior to Admission medications   Medication Sig Start Date End Date Taking? Authorizing Provider  allopurinol (ZYLOPRIM) 300 MG tablet TAKE (1) TABLET BY MOUTH TWICE DAILY 05/08/18  Yes Duanne Limerick, MD  amiodarone (PACERONE) 200 MG tablet Take 200 mg by mouth daily. Dr Gwen Pounds   Yes [provider]  atorvastatin (LIPITOR) 20 MG tablet Take 1 tablet by mouth daily. [place] 01/05/18 01/05/19 Yes [provider]  carvedilol (COREG) 25 MG tablet Take 25 mg by mouth 2 (two) times daily with a meal. Dr Gwen Pounds   Yes [provider]  furosemide (LASIX) 40 MG tablet Take 1 tablet (40 mg total) by mouth 2 (two) times daily. Take 2 tabs in the morning Patient taking differently: Take 40 mg by mouth 2 (two) times daily. Take 2 tabs in the morning/ Dr Gwen Pounds 02/05/16  Yes Renae Gloss, Richard, MD  hydrALAZINE (APRESOLINE) 25 MG tablet Take 1 tablet (25 mg total) by mouth every 8 (eight) hours. Patient taking differently: Take 50 mg by mouth every 8 (eight) hours.  04/25/17  Yes Auburn Bilberry, MD  isosorbide mononitrate (IMDUR) 30 MG 24  hr tablet Take 30 mg by mouth daily. Dr Gwen PoundsKowalski   Yes [provider]  montelukast (SINGULAIR) 10 MG tablet TAKE ONE TABLET AT BEDTIME. 04/03/18  Yes Duanne LimerickJones, Deanna C, MD  potassium chloride SA (K-DUR,KLOR-CON) 20 MEQ tablet Take 1 tablet (20 mEq total) by mouth 3 (three) times daily. Patient taking differently: Take 20 mEq by mouth 3 (three) times daily. Dr Gwen PoundsKowalski 08/06/16  Yes Duanne LimerickJones, Deanna C, MD  sacubitril-valsartan (ENTRESTO) 24-26 MG Take 1 tablet by mouth 2 (two) times daily.  03/31/17  Yes [provider]  timolol (TIMOPTIC) 0.5 % ophthalmic solution Place 1 drop into both eyes 2 (two) times daily. Dr Inez PilgrimBrasington   Yes  [provider]  cephALEXin (KEFLEX) 500 MG capsule Take 1 capsule (500 mg total) by mouth 2 (two) times daily. Patient not taking: Reported on 05/28/2018 02/17/18   Duanne LimerickJones, Deanna C, MD  loratadine (CLARITIN) 10 MG tablet Take 1 tablet (10 mg total) by mouth daily. Patient taking differently: Take 10 mg by mouth daily as needed for allergies.  08/06/16   Duanne LimerickJones, Deanna C, MD  mometasone (NASONEX) 50 MCG/ACT nasal spray Place 2 sprays into the nose daily. Patient not taking: Reported on 05/28/2018 09/29/17   Duanne LimerickJones, Deanna C, MD  mupirocin ointment (BACTROBAN) 2 % Apply 1 application topically 2 (two) times daily. Patient not taking: Reported on 05/28/2018 02/17/18   Duanne LimerickJones, Deanna C, MD  torsemide (DEMADEX) 20 MG tablet Take 2 tablets (40 mg total) by mouth daily. 05/28/18   Sharyn CreamerQuale, Argie Applegate, MD    Allergies Niaspan [niacin er]  Family History  Problem Relation Age of Onset  . Hypertension Mother   . Pancreatic cancer Mother   . Hypertension Father   . Prostate cancer Father     Social History Social History   Tobacco Use  . Smoking status: Former Smoker    Packs/day: 2.00    Years: 3.00    Pack years: 6.00    Types: Cigarettes    Last attempt to quit: 1960    Years since quitting: 59.9  . Smokeless tobacco: Never Used  . Tobacco comment: Smoking cessation materials not required  Substance Use Topics  . Alcohol use: No  . Drug use: No    Review of Systems Constitutional: No fever/chills Eyes: No visual changes. ENT: No sore throat. Cardiovascular: Denies chest pain. Respiratory: See HPI  gastrointestinal: No abdominal pain.   Genitourinary: Negative for dysuria. Musculoskeletal: Negative for back pain. Skin: Negative for rash. Neurological: Negative for headaches, areas of focal weakness or numbness.    ____________________________________________   PHYSICAL EXAM:  VITAL SIGNS: ED Triage Vitals  Enc Vitals Group     BP 05/28/18 1911 (!) 168/97     Pulse Rate  05/28/18 1911 63     Resp 05/28/18 1911 17     Temp 05/28/18 1911 97.9 F (36.6 C)     Temp Source 05/28/18 1911 Oral     SpO2 --      Weight 05/28/18 1914 215 lb (97.5 kg)     Height 05/28/18 1914 5\' 11"  (1.803 m)     Head Circumference --      Peak Flow --      Pain Score 05/28/18 1913 0     Pain Loc --      Pain Edu? --      Excl. in GC? --     Constitutional: Alert and oriented. Well appearing and in no acute distress.  Patient and his  family at the bedside all very pleasant. Eyes: Conjunctivae are normal. Head: Atraumatic. Nose: No congestion/rhinnorhea. Mouth/Throat: Mucous membranes are moist. Neck: No stridor.  There is mild JVD. Cardiovascular: Normal rate, regular rhythm. Grossly normal heart sounds.  Good peripheral circulation. Respiratory: Normal respiratory effort.  No retractions. Lungs CTAB.  Clear and normal respirations.  Gastrointestinal: Soft and nontender. No distention. Musculoskeletal: No lower extremity tenderness but demonstrates notable bilateral 3+ lower extremity pitting edema.  He does have a few areas of weeping of clear fluid from the lower extremities.  There is a square, red area overlying the inner right lower leg consistent with contact irritation from in the shape of a Band-Aid.  No purulent discharge, no surrounding erythema or warmth.  No signs of infection. Neurologic:  Normal speech and language. No gross focal neurologic deficits are appreciated.  Skin:  Skin is warm, dry. Psychiatric: Mood and affect are normal. Speech and behavior are normal.  ____________________________________________   LABS (all labs ordered are listed, but only abnormal results are displayed)  Labs Reviewed  CBC - Abnormal; Notable for the following components:      Result Value   RBC 4.16 (*)    Hemoglobin 12.4 (*)    RDW 15.7 (*)    All other components within normal limits  BASIC METABOLIC PANEL - Abnormal; Notable for the following components:   Glucose,  Bld 113 (*)    Creatinine, Ser 2.11 (*)    Calcium 8.3 (*)    GFR calc non Af Amer 30 (*)    GFR calc Af Amer 34 (*)    All other components within normal limits  BRAIN NATRIURETIC PEPTIDE - Abnormal; Notable for the following components:   B Natriuretic Peptide 2,337.0 (*)    All other components within normal limits   ____________________________________________  EKG  Reviewed and interpreted by me at 1920 Heart rate 69 QRS 130 QTc 470 Normal sinus rhythm, occasional PVC.  Mild nonspecific T wave abnormalities.  No evidence of acute ischemia ____________________________________________  RADIOLOGY  Dg Chest 2 View  Result Date: 05/28/2018 CLINICAL DATA:  BILATERAL leg swelling for 1 week, exertional dyspnea for 2 days, anasarca, shortness of breath, CHF, on Lasix EXAM: CHEST - 2 VIEW COMPARISON:  12/19/2017 FINDINGS: Enlargement of cardiac silhouette. Atherosclerotic calcification aorta. Mediastinal contours and pulmonary vascularity normal. Bibasilar atelectasis and small pleural effusions. No acute infiltrate or pneumothorax. Bones demineralized. IMPRESSION: Enlargement of cardiac silhouette. Bibasilar pleural effusions and atelectasis. Electronically Signed   By: Ulyses Southward M.D.   On: 05/28/2018 19:53   US Venous Img Lower Bilateral  Result Date: 05/28/2018 CLINICAL DATA:  Exertional dyspnea EXAM: BILATERAL LOWER EXTREMITY VENOUS DOPPLER ULTRASOUND TECHNIQUE: Gray-scale sonography with graded compression, as well as color Doppler and duplex ultrasound were performed to evaluate the lower extremity deep venous systems from the level of the common femoral vein and including the common femoral, femoral, profunda femoral, popliteal and calf veins including the posterior tibial, peroneal and gastrocnemius veins when visible. The superficial great saphenous vein was also interrogated. Spectral Doppler was utilized to evaluate flow at rest and with distal augmentation maneuvers in the  common femoral, femoral and popliteal veins. COMPARISON:  03/13/2011 bilateral lower extremity venous Doppler scan FINDINGS: RIGHT LOWER EXTREMITY Common Femoral Vein: No evidence of thrombus. Normal compressibility, respiratory phasicity and response to augmentation. Saphenofemoral Junction: No evidence of thrombus. Normal compressibility and flow on color Doppler imaging. Profunda Femoral Vein: No evidence of thrombus. Normal compressibility and flow on  color Doppler imaging. Femoral Vein: No evidence of thrombus. Normal compressibility, respiratory phasicity and response to augmentation. Popliteal Vein: No evidence of thrombus. Normal compressibility, respiratory phasicity and response to augmentation. Calf Veins: No evidence of thrombus. Normal compressibility and flow on color Doppler imaging. Superficial Great Saphenous Vein: No evidence of thrombus. Normal compressibility. Venous Reflux:  None. Other Findings:  None. LEFT LOWER EXTREMITY Common Femoral Vein: No evidence of thrombus. Normal compressibility, respiratory phasicity and response to augmentation. Saphenofemoral Junction: No evidence of thrombus. Normal compressibility and flow on color Doppler imaging. Profunda Femoral Vein: No evidence of thrombus. Normal compressibility and flow on color Doppler imaging. Femoral Vein: No evidence of thrombus. Normal compressibility, respiratory phasicity and response to augmentation. Popliteal Vein: No evidence of thrombus. Normal compressibility, respiratory phasicity and response to augmentation. Calf Veins: No evidence of thrombus. Normal compressibility and flow on color Doppler imaging. Superficial Great Saphenous Vein: No evidence of thrombus. Normal compressibility. Venous Reflux:  None. Other Findings:  None. IMPRESSION: No evidence of deep venous thrombosis in either lower extremity. Electronically Signed   By: Delbert Phenix M.D.   On: 05/28/2018 20:36      ____________________________________________   PROCEDURES  Procedure(s) performed: None  Procedures  Critical Care performed: No  ____________________________________________   INITIAL IMPRESSION / ASSESSMENT AND PLAN / ED COURSE  Pertinent labs & imaging results that were available during my care of the patient were reviewed by me and considered in my medical decision making (see chart for details).   Patient presents for evaluation of severe lower extremity edema.  Does have notable bilateral lower extremity edema, history of congestive heart failure.  Does not appear to have any acute pulmonary involvement other than some symptoms with walking over the last 2 days.  No chest pain.  Does not appear to be acute coronary syndrome.  Suspect this is volume overload, CHF, without evidence of superinfection.    ----------------------------------------- 8:15 PM on 05/28/2018 -----------------------------------------  Case discussed with Dr. Juliann Pares, reviewed his labs, x-ray, presentation and cardiology advises giving 80 mg IV Lasix once now.  Observe for improvement and diuresis in the ER, if diuresing well after a couple of hours would consider discharge but if not consider admission for further observation.  Patient diuresed well, approximately 1 L after IV Lasix and feels improved.  Resting comfortably with normal vital signs except for some slight hypertension.  Discussed with patient, will be following up Monday with his primary care doctor and have spoke with Dr. Juliann Pares and notified his primary care doctor Dr. Gwen Pounds as well for follow-up this week in the clinic.  Return precautions and treatment recommendations and follow-up discussed with the patient who is agreeable with the plan.    ____________________________________________   FINAL CLINICAL IMPRESSION(S) / ED DIAGNOSES  Final diagnoses:  Peripheral edema  Acute on chronic congestive heart failure,  unspecified heart failure type [facility])        Note:  This document was prepared using Dragon voice recognition software and may include unintentional dictation errors       Sharyn Creamer, MD 05/29/18 [phone]

## 2018-05-28 NOTE — ED Notes (Signed)
Pt assisted to the use of a urinal. Pt was able to stand with a steady gait. Pt urinated 100 cc

## 2018-06-01 ENCOUNTER — Ambulatory Visit: Payer: Medicare Other | Admitting: Family Medicine

## 2018-06-05 DIAGNOSIS — I10 Essential (primary) hypertension: Secondary | ICD-10-CM | POA: Diagnosis not present

## 2018-06-05 DIAGNOSIS — N183 Chronic kidney disease, stage 3 (moderate): Secondary | ICD-10-CM | POA: Diagnosis not present

## 2018-06-05 DIAGNOSIS — I5023 Acute on chronic systolic (congestive) heart failure: Secondary | ICD-10-CM | POA: Diagnosis not present

## 2018-06-05 DIAGNOSIS — I2510 Atherosclerotic heart disease of native coronary artery without angina pectoris: Secondary | ICD-10-CM | POA: Diagnosis not present

## 2018-06-12 ENCOUNTER — Encounter: Payer: Self-pay | Admitting: Family Medicine

## 2018-06-12 ENCOUNTER — Ambulatory Visit (INDEPENDENT_AMBULATORY_CARE_PROVIDER_SITE_OTHER): Payer: Medicare Other | Admitting: Family Medicine

## 2018-06-12 VITALS — BP 132/78 | HR 64 | Ht 71.0 in | Wt 230.0 lb

## 2018-06-12 DIAGNOSIS — I5022 Chronic systolic (congestive) heart failure: Secondary | ICD-10-CM

## 2018-06-12 DIAGNOSIS — N183 Chronic kidney disease, stage 3 unspecified: Secondary | ICD-10-CM

## 2018-06-12 DIAGNOSIS — E876 Hypokalemia: Secondary | ICD-10-CM | POA: Diagnosis not present

## 2018-06-12 DIAGNOSIS — I872 Venous insufficiency (chronic) (peripheral): Secondary | ICD-10-CM | POA: Diagnosis not present

## 2018-06-12 DIAGNOSIS — J069 Acute upper respiratory infection, unspecified: Secondary | ICD-10-CM | POA: Diagnosis not present

## 2018-06-12 DIAGNOSIS — L97801 Non-pressure chronic ulcer of other part of unspecified lower leg limited to breakdown of skin: Secondary | ICD-10-CM

## 2018-06-12 NOTE — Progress Notes (Signed)
Date:  06/12/2018   Name:  Larry Lawrence   DOB:  04-28-1941   MRN:  [id number]   Chief Complaint: Follow-up (ER follow up- gave IV lasix and sent home. Has some spots on R) leg, but have nearly cleared up. No oozing anymore.)  Congestive Heart Failure  Presents for follow-up visit. Associated symptoms include edema and nocturia. Pertinent negatives include no abdominal pain, chest pain, chest pressure, claudication, fatigue, muscle weakness, near-syncope, orthopnea, palpitations, paroxysmal nocturnal dyspnea, shortness of breath or unexpected weight change. The symptoms have been improving. Compliance with total regimen is 76-100%.    Review of Systems  Constitutional: Negative for activity change, appetite change, chills, diaphoresis, fatigue, fever and unexpected weight change.  Respiratory: Negative for shortness of breath.   Cardiovascular: Negative for chest pain, palpitations, claudication and near-syncope.  Gastrointestinal: Negative for abdominal pain.  Genitourinary: Positive for nocturia.  Musculoskeletal: Negative for muscle weakness.    Patient Active Problem List   Diagnosis Date Noted  . Near syncope 04/24/2017  . Bradycardia 04/24/2017  . Gout 02/01/2016  . Syncope 08/09/2015  . Hypokalemia 08/09/2015  . CKD (chronic kidney disease), stage III (HCC) 02/20/2015  . Chronic systolic CHF (congestive heart failure) (HCC) 02/20/2015  . CKD (chronic kidney disease) 02/20/2015    Allergies  Allergen Reactions  . Niaspan [Niacin Er] Other (See Comments)    Reaction:  Hot flashes     Past Surgical History:  Procedure Laterality Date  . CARDIAC CATHETERIZATION N/A 02/02/2016   Procedure: Left Heart Cath and Coronary Angiography;  Surgeon: Lamar Blinks, MD;  Location: [facility] INVASIVE CV LAB;  Service: Cardiovascular;  Laterality: N/A;  . CARDIAC SURGERY     3 stents in "main artery"  . JOINT REPLACEMENT      Social History   Tobacco Use  . Smoking status:  Former Smoker    Packs/day: 2.00    Years: 3.00    Pack years: 6.00    Types: Cigarettes    Last attempt to quit: 1960    Years since quitting: 59.9  . Smokeless tobacco: Never Used  . Tobacco comment: Smoking cessation materials not required  Substance Use Topics  . Alcohol use: No  . Drug use: No     Medication list has been reviewed and updated.  Current Meds  Medication Sig  . allopurinol (ZYLOPRIM) 300 MG tablet TAKE (1) TABLET BY MOUTH TWICE DAILY  . amiodarone (PACERONE) 200 MG tablet Take 200 mg by mouth daily. Dr Gwen Pounds  . atorvastatin (LIPITOR) 20 MG tablet Take 1 tablet by mouth daily. [place]  . carvedilol (COREG) 25 MG tablet Take 25 mg by mouth 2 (two) times daily with a meal. Dr Gwen Pounds  . furosemide (LASIX) 40 MG tablet Take 1 tablet (40 mg total) by mouth 2 (two) times daily. Take 2 tabs in the morning (Patient taking differently: Take 40 mg by mouth 2 (two) times daily. Take 2 tabs in the morning/ Dr Gwen Pounds)  . hydrALAZINE (APRESOLINE) 25 MG tablet Take 1 tablet (25 mg total) by mouth every 8 (eight) hours. (Patient taking differently: Take 50 mg by mouth every 8 (eight) hours. )  . isosorbide mononitrate (IMDUR) 30 MG 24 hr tablet Take 30 mg by mouth daily. Dr Gwen Pounds  . loratadine (CLARITIN) 10 MG tablet Take 1 tablet (10 mg total) by mouth daily. (Patient taking differently: Take 10 mg by mouth daily as needed for allergies. )  . mometasone (NASONEX) 50 MCG/ACT nasal spray  Place 2 sprays into the nose daily.  . montelukast (SINGULAIR) 10 MG tablet TAKE ONE TABLET AT BEDTIME.  Marland Kitchen potassium chloride SA (K-DUR,KLOR-CON) 20 MEQ tablet Take 1 tablet (20 mEq total) by mouth 3 (three) times daily. (Patient taking differently: Take 20 mEq by mouth 3 (three) times daily. Dr Gwen Pounds)  . sacubitril-valsartan (ENTRESTO) 24-26 MG Take 1 tablet by mouth 2 (two) times daily.   . timolol (TIMOPTIC) 0.5 % ophthalmic solution Place 1 drop into both eyes 2 (two) times daily.  Dr Inez Pilgrim  . torsemide (DEMADEX) 20 MG tablet Take 2 tablets (40 mg total) by mouth daily.  . [DISCONTINUED] mupirocin ointment (BACTROBAN) 2 % Apply 1 application topically 2 (two) times daily.    PHQ 2/9 Scores 06/25/2017 04/15/2017 12/29/2015  PHQ - 2 Score 0 0 0  PHQ- 9 Score - 0 -    Physical Exam Vitals signs and nursing note reviewed.  HENT:     Head: Normocephalic.     Right Ear: External ear normal.     Left Ear: External ear normal.     Nose: Nose normal.  Eyes:     General: No scleral icterus.       Right eye: No discharge.        Left eye: No discharge.     Conjunctiva/sclera: Conjunctivae normal.     Pupils: Pupils are equal, round, and reactive to light.  Neck:     Musculoskeletal: Normal range of motion and neck supple.     Thyroid: No thyromegaly.     Vascular: No JVD.     Trachea: No tracheal deviation.  Cardiovascular:     Rate and Rhythm: Normal rate and regular rhythm.     Chest Wall: PMI is not displaced.     Heart sounds: Normal heart sounds, S1 normal and S2 normal. No murmur. No systolic murmur. No diastolic murmur. No friction rub. No gallop. No S3 or S4 sounds.   Pulmonary:     Effort: No respiratory distress.     Breath sounds: Normal breath sounds. No wheezing or rales.  Abdominal:     General: Bowel sounds are normal.     Palpations: Abdomen is soft. There is no mass.     Tenderness: There is no abdominal tenderness. There is no guarding or rebound.  Musculoskeletal: Normal range of motion.        General: No tenderness.     Right lower leg: 1+ Pitting Edema present.     Left lower leg: 1+ Pitting Edema present.  Lymphadenopathy:     Cervical: No cervical adenopathy.  Skin:    General: Skin is warm.     Findings: No rash.  Neurological:     Mental Status: He is alert and oriented to person, place, and time.     Cranial Nerves: No cranial nerve deficit.     Deep Tendon Reflexes: Reflexes are normal and symmetric.     BP 132/78    Pulse 64   Ht 5\' 11"  (1.803 m)   Wt 230 lb (104.3 kg)   BMI 32.08 kg/m   Assessment and Plan:  1. Chronic systolic CHF (congestive heart failure) (HCC) Acute on chronic congestive heart failure follow-up from the emergency room.  Was put on Lasix has had a gradual diuresis with a decrease in peripheral edema.  Recheck renal function panel to evaluate calcium status. - Renal Function Panel  2. CKD (chronic kidney disease), stage III (HCC) Currently stable.  Chronic.  Check  renal panel for evaluation of this.  3. Hypokalemia Patient has history of hypokalemia was recently diuresed with furosemide check repeat renal function panel on status. - Renal Function Panel  4. Venous stasis ulcer of other part of lower leg limited to breakdown of skin without varicose veins, unspecified laterality (HCC) Previous noted small stasis ulcerations on lower leg due to edema these are gradually healing decreased image.  5. Viral upper respiratory tract infection Patient has upper respiratory congestion with out productive cough or nasal congestion likely viral in nature will continue to watch call if there is any change in condition or fever.

## 2018-06-13 LAB — RENAL FUNCTION PANEL
ALBUMIN: 3.5 g/dL (ref 3.5–4.8)
BUN/Creatinine Ratio: 13 (ref 10–24)
BUN: 22 mg/dL (ref 8–27)
CO2: 23 mmol/L (ref 20–29)
Calcium: 8.6 mg/dL (ref 8.6–10.2)
Chloride: 112 mmol/L — ABNORMAL HIGH (ref 96–106)
Creatinine, Ser: 1.76 mg/dL — ABNORMAL HIGH (ref 0.76–1.27)
GFR calc Af Amer: 43 mL/min/{1.73_m2} — ABNORMAL LOW (ref >59)
GFR, EST NON AFRICAN AMERICAN: 37 mL/min/{1.73_m2} — AB (ref >59)
Glucose: 115 mg/dL — ABNORMAL HIGH (ref 65–99)
Phosphorus: 3.5 mg/dL (ref 2.5–4.5)
Potassium: 4.4 mmol/L (ref 3.5–5.2)
Sodium: 147 mmol/L — ABNORMAL HIGH (ref 134–144)

## 2018-06-19 ENCOUNTER — Telehealth: Payer: Self-pay | Admitting: Family Medicine

## 2018-06-19 NOTE — Telephone Encounter (Signed)
Tried to call patient to set up AWV-S.  Last AWV 06/25/17  No answer.  Voice mailbox not set up yet. lec

## 2018-06-21 ENCOUNTER — Encounter: Payer: Self-pay | Admitting: Emergency Medicine

## 2018-06-21 ENCOUNTER — Emergency Department: Payer: Medicare Other

## 2018-06-21 ENCOUNTER — Other Ambulatory Visit: Payer: Self-pay

## 2018-06-21 ENCOUNTER — Emergency Department
Admission: EM | Admit: 2018-06-21 | Discharge: 2018-06-21 | Disposition: A | Discharge status: Home / Self Care | Payer: Medicare Other | Attending: Student in an Organized Health Care Education/Training Program | Admitting: Student in an Organized Health Care Education/Training Program

## 2018-06-21 DIAGNOSIS — R2233 Localized swelling, mass and lump, upper limb, bilateral: Secondary | ICD-10-CM | POA: Insufficient documentation

## 2018-06-21 DIAGNOSIS — M25562 Pain in left knee: Secondary | ICD-10-CM | POA: Diagnosis not present

## 2018-06-21 DIAGNOSIS — Z79899 Other long term (current) drug therapy: Secondary | ICD-10-CM | POA: Insufficient documentation

## 2018-06-21 DIAGNOSIS — G8929 Other chronic pain: Secondary | ICD-10-CM | POA: Diagnosis not present

## 2018-06-21 DIAGNOSIS — Z87891 Personal history of nicotine dependence: Secondary | ICD-10-CM | POA: Insufficient documentation

## 2018-06-21 DIAGNOSIS — M6281 Muscle weakness (generalized): Secondary | ICD-10-CM | POA: Diagnosis not present

## 2018-06-21 DIAGNOSIS — N183 Chronic kidney disease, stage 3 (moderate): Secondary | ICD-10-CM | POA: Diagnosis not present

## 2018-06-21 DIAGNOSIS — S299XXA Unspecified injury of thorax, initial encounter: Secondary | ICD-10-CM | POA: Diagnosis not present

## 2018-06-21 DIAGNOSIS — Z8673 Personal history of transient ischemic attack (TIA), and cerebral infarction without residual deficits: Secondary | ICD-10-CM | POA: Diagnosis not present

## 2018-06-21 DIAGNOSIS — I10 Essential (primary) hypertension: Secondary | ICD-10-CM | POA: Diagnosis not present

## 2018-06-21 DIAGNOSIS — S8992XA Unspecified injury of left lower leg, initial encounter: Secondary | ICD-10-CM | POA: Diagnosis not present

## 2018-06-21 DIAGNOSIS — R4182 Altered mental status, unspecified: Secondary | ICD-10-CM | POA: Diagnosis not present

## 2018-06-21 DIAGNOSIS — R609 Edema, unspecified: Secondary | ICD-10-CM | POA: Diagnosis not present

## 2018-06-21 DIAGNOSIS — I2510 Atherosclerotic heart disease of native coronary artery without angina pectoris: Secondary | ICD-10-CM | POA: Diagnosis not present

## 2018-06-21 DIAGNOSIS — R079 Chest pain, unspecified: Secondary | ICD-10-CM | POA: Diagnosis not present

## 2018-06-21 DIAGNOSIS — R531 Weakness: Secondary | ICD-10-CM | POA: Diagnosis not present

## 2018-06-21 DIAGNOSIS — I5022 Chronic systolic (congestive) heart failure: Secondary | ICD-10-CM | POA: Insufficient documentation

## 2018-06-21 LAB — CBC WITH DIFFERENTIAL/PLATELET
Abs Immature Granulocytes: 0.06 10*3/uL (ref 0.00–0.07)
BASOS ABS: 0 10*3/uL (ref 0.0–0.1)
Basophils Relative: 0 %
Eosinophils Absolute: 0.2 10*3/uL (ref 0.0–0.5)
Eosinophils Relative: 2 %
HCT: 39.8 % (ref 39.0–52.0)
Hemoglobin: 12.4 g/dL — ABNORMAL LOW (ref 13.0–17.0)
Immature Granulocytes: 1 %
Lymphocytes Relative: 10 %
Lymphs Abs: 0.9 10*3/uL (ref 0.7–4.0)
MCH: 29.4 pg (ref 26.0–34.0)
MCHC: 31.2 g/dL (ref 30.0–36.0)
MCV: 94.3 fL (ref 80.0–100.0)
Monocytes Absolute: 0.6 10*3/uL (ref 0.1–1.0)
Monocytes Relative: 7 %
Neutro Abs: 6.9 10*3/uL (ref 1.7–7.7)
Neutrophils Relative %: 80 %
PLATELETS: 198 10*3/uL (ref 150–400)
RBC: 4.22 MIL/uL (ref 4.22–5.81)
RDW: 16.2 % — ABNORMAL HIGH (ref 11.5–15.5)
WBC: 8.7 10*3/uL (ref 4.0–10.5)
nRBC: 0.5 % — ABNORMAL HIGH (ref 0.0–0.2)

## 2018-06-21 LAB — URINALYSIS, COMPLETE (UACMP) WITH MICROSCOPIC
Bacteria, UA: NONE SEEN
Bilirubin Urine: NEGATIVE
Glucose, UA: NEGATIVE mg/dL
Hgb urine dipstick: NEGATIVE
Ketones, ur: NEGATIVE mg/dL
LEUKOCYTES UA: NEGATIVE
Nitrite: NEGATIVE
PROTEIN: 100 mg/dL — AB
Specific Gravity, Urine: 1.02 (ref 1.005–1.030)
pH: 5 (ref 5.0–8.0)

## 2018-06-21 LAB — COMPREHENSIVE METABOLIC PANEL
ALT: 22 U/L (ref 0–44)
AST: 32 U/L (ref 15–41)
Albumin: 3.5 g/dL (ref 3.5–5.0)
Alkaline Phosphatase: 160 U/L — ABNORMAL HIGH (ref 38–126)
Anion gap: 9 (ref 5–15)
BUN: 27 mg/dL — ABNORMAL HIGH (ref 8–23)
CHLORIDE: 108 mmol/L (ref 98–111)
CO2: 22 mmol/L (ref 22–32)
Calcium: 8.5 mg/dL — ABNORMAL LOW (ref 8.9–10.3)
Creatinine, Ser: 1.86 mg/dL — ABNORMAL HIGH (ref 0.61–1.24)
GFR calc Af Amer: 40 mL/min — ABNORMAL LOW (ref >60)
GFR calc non Af Amer: 35 mL/min — ABNORMAL LOW (ref >60)
Glucose, Bld: 124 mg/dL — ABNORMAL HIGH (ref 70–99)
Potassium: 4 mmol/L (ref 3.5–5.1)
Sodium: 139 mmol/L (ref 135–145)
Total Bilirubin: 1.4 mg/dL — ABNORMAL HIGH (ref 0.3–1.2)
Total Protein: 6.7 g/dL (ref 6.5–8.1)

## 2018-06-21 MED ORDER — ASPIRIN 81 MG PO CHEW
324.0000 mg | CHEWABLE_TABLET | Freq: Once | ORAL | Status: AC
  Administered 2018-06-21: 324 mg via ORAL
  Filled 2018-06-21: qty 4

## 2018-06-21 MED ORDER — LORAZEPAM 0.5 MG PO TABS
0.5000 mg | ORAL_TABLET | Freq: Once | ORAL | Status: AC
  Administered 2018-06-21: 0.5 mg via ORAL
  Filled 2018-06-21: qty 1

## 2018-06-21 NOTE — ED Notes (Signed)
Patient transported to MRI by this Clinical research associatewriter via Doctor, general practicestretcher

## 2018-06-21 NOTE — ED Triage Notes (Signed)
EMS pt to rm 26 from home with report of left leg giving out while going up the steps at home. Did not fall. Was in chair on EMS arrival. Able to stand with assistance of EMS to get to stretcher. Pt has hx of bilateral lower extremity swelling.

## 2018-06-21 NOTE — ED Notes (Signed)
Pt returned to treatment room ED 26

## 2018-06-21 NOTE — ED Provider Notes (Signed)
[facility] Emergency Department Provider Note    First MD Initiated Contact with Patient 06/21/18 [phone]     (approximate)  I have reviewed the triage vital signs and the nursing notes.   HISTORY  Chief Complaint Extremity Weakness    HPI Larry Lawrence is a 77 y.o. male extensive past medical history as described above presents the ER for left leg giving out while he was walking upstairs today.  Did not fall as he was stopped by his grandson.  Denies any headache.  No slurred speech.  Has had symptoms happened similarly in the past.  Was recently admitted to the hospital for significant leg swelling.  Feels as swelling in his legs was actually improved as compared to previous but is now having worsening swelling in upper extremity.  Does feel the left leg is more weak than the right currently.  Denies any numbness or tingling.  Denies any chest pain or shortness of breath.    Past Medical History:  Diagnosis Date  . Allergy   . CHF (congestive heart failure) (HCC)   . Coronary artery disease   . Gout   . Insomnia   . Renal disorder   . Sleeps in sitting position due to orthopnea   . TIA (transient ischemic attack)    Family History  Problem Relation Age of Onset  . Hypertension Mother   . Pancreatic cancer Mother   . Hypertension Father   . Prostate cancer Father    Past Surgical History:  Procedure Laterality Date  . CARDIAC CATHETERIZATION N/A 02/02/2016   Procedure: Left Heart Cath and Coronary Angiography;  Surgeon: Lamar Blinks, MD;  Location: [facility] INVASIVE CV LAB;  Service: Cardiovascular;  Laterality: N/A;  . CARDIAC SURGERY     3 stents in "main artery"  . JOINT REPLACEMENT     Patient Active Problem List   Diagnosis Date Noted  . Near syncope 04/24/2017  . Bradycardia 04/24/2017  . Gout 02/01/2016  . Syncope 08/09/2015  . Hypokalemia 08/09/2015  . CKD (chronic kidney disease), stage III (HCC) 02/20/2015  . Chronic systolic CHF  (congestive heart failure) (HCC) 02/20/2015  . CKD (chronic kidney disease) 02/20/2015      Prior to Admission medications   Medication Sig Start Date End Date Taking? Authorizing Provider  allopurinol (ZYLOPRIM) 300 MG tablet TAKE (1) TABLET BY MOUTH TWICE DAILY 05/08/18  Yes Duanne Limerick, MD  amiodarone (PACERONE) 200 MG tablet Take 200 mg by mouth daily. Dr Gwen Pounds   Yes [provider]  atorvastatin (LIPITOR) 20 MG tablet Take 1 tablet by mouth daily. [place] 01/05/18 01/05/19 Yes [provider]  carvedilol (COREG) 25 MG tablet Take 25 mg by mouth 2 (two) times daily with a meal. Dr Gwen Pounds   Yes [provider]  furosemide (LASIX) 40 MG tablet Take 1 tablet (40 mg total) by mouth 2 (two) times daily. Take 2 tabs in the morning Patient taking differently: Take 40 mg by mouth 2 (two) times daily. Take 2 tabs in the morning/ Dr Gwen Pounds 02/05/16  Yes Renae Gloss, Richard, MD  hydrALAZINE (APRESOLINE) 25 MG tablet Take 1 tablet (25 mg total) by mouth every 8 (eight) hours. Patient taking differently: Take 50 mg by mouth every 8 (eight) hours.  04/25/17  Yes Auburn Bilberry, MD  isosorbide mononitrate (IMDUR) 30 MG 24 hr tablet Take 30 mg by mouth daily. Dr Gwen Pounds   Yes [provider]  mometasone (NASONEX) 50 MCG/ACT nasal spray  Place 2 sprays into the nose daily. 09/29/17  Yes Duanne LimerickJones, Deanna C, MD  montelukast (SINGULAIR) 10 MG tablet TAKE ONE TABLET AT BEDTIME. 04/03/18  Yes Duanne LimerickJones, Deanna C, MD  potassium chloride SA (K-DUR,KLOR-CON) 20 MEQ tablet Take 1 tablet (20 mEq total) by mouth 3 (three) times daily. Patient taking differently: Take 20 mEq by mouth 3 (three) times daily. Dr Gwen PoundsKowalski 08/06/16  Yes Duanne LimerickJones, Deanna C, MD  sacubitril-valsartan (ENTRESTO) 24-26 MG Take 1 tablet by mouth 2 (two) times daily.  03/31/17  Yes [provider]  timolol (TIMOPTIC) 0.5 % ophthalmic solution Place 1 drop into both eyes 2 (two) times daily. Dr Inez PilgrimBrasington   Yes  [provider]  torsemide (DEMADEX) 20 MG tablet Take 2 tablets (40 mg total) by mouth daily. 05/28/18  Yes Sharyn CreamerQuale, Mark, MD  loratadine (CLARITIN) 10 MG tablet Take 1 tablet (10 mg total) by mouth daily. Patient taking differently: Take 10 mg by mouth daily as needed for allergies.  08/06/16   Duanne LimerickJones, Deanna C, MD    Allergies Niaspan [niacin er]    Social History Social History   Tobacco Use  . Smoking status: Former Smoker    Packs/day: 2.00    Years: 3.00    Pack years: 6.00    Types: Cigarettes    Last attempt to quit: 1960    Years since quitting: 60.0  . Smokeless tobacco: Never Used  . Tobacco comment: Smoking cessation materials not required  Substance Use Topics  . Alcohol use: No  . Drug use: No    Review of Systems Patient denies headaches, rhinorrhea, blurry vision, numbness, shortness of breath, chest pain, edema, cough, abdominal pain, nausea, vomiting, diarrhea, dysuria, fevers, rashes or hallucinations unless otherwise stated above in HPI. ____________________________________________   PHYSICAL EXAM:  VITAL SIGNS: Vitals:   06/21/18 0300 06/21/18 0330  BP: (!) 170/88 (!) 170/91  Pulse: (!) 55 (!) 52  Resp: 17 17  Temp:    SpO2: 99% 100%    Constitutional: Alert and oriented.  Eyes: Conjunctivae are normal.  Head: Atraumatic. Nose: No congestion/rhinnorhea. Mouth/Throat: Mucous membranes are moist.   Neck: No stridor. Painless ROM.  Cardiovascular: Normal rate, regular rhythm. Grossly normal heart sounds.  Good peripheral circulation. Respiratory: Normal respiratory effort.  No retractions. Lungs CTAB. Gastrointestinal: Soft and nontender. No distention. No abdominal bruits. No CVA tenderness. Genitourinary:  Musculoskeletal: No lower extremity tenderness  Or effusion but there is ble edema.  2+ dp and pt pulses.  Pain reproduced with range of left knee Neurologic:  Normal speech and language. No gross focal neurologic deficits are  appreciated. No facial droop Skin:  Skin is warm, dry and intact. No rash noted. Psychiatric: Mood and affect are normal. Speech and behavior are normal.  ____________________________________________   LABS (all labs ordered are listed, but only abnormal results are displayed)  Results for orders placed or performed during the hospital encounter of 06/21/18 (from the past 24 hour(s))  CBC with Differential/Platelet     Status: Abnormal   Collection Time: 06/21/18  1:13 AM  Result Value Ref Range   WBC 8.7 4.0 - 10.5 K/uL   RBC 4.22 4.22 - 5.81 MIL/uL   Hemoglobin 12.4 (L) 13.0 - 17.0 g/dL   HCT 16.139.8 09.639.0 - 04.552.0 %   MCV 94.3 80.0 - 100.0 fL   MCH 29.4 26.0 - 34.0 pg   MCHC 31.2 30.0 - 36.0 g/dL   RDW 40.916.2 (H) 81.111.5 - 91.415.5 %   Platelets  198 150 - 400 K/uL   nRBC 0.5 (H) 0.0 - 0.2 %   Neutrophils Relative % 80 %   Neutro Abs 6.9 1.7 - 7.7 K/uL   Lymphocytes Relative 10 %   Lymphs Abs 0.9 0.7 - 4.0 K/uL   Monocytes Relative 7 %   Monocytes Absolute 0.6 0.1 - 1.0 K/uL   Eosinophils Relative 2 %   Eosinophils Absolute 0.2 0.0 - 0.5 K/uL   Basophils Relative 0 %   Basophils Absolute 0.0 0.0 - 0.1 K/uL   Immature Granulocytes 1 %   Abs Immature Granulocytes 0.06 0.00 - 0.07 K/uL  Comprehensive metabolic panel     Status: Abnormal   Collection Time: 06/21/18  1:13 AM  Result Value Ref Range   Sodium 139 135 - 145 mmol/L   Potassium 4.0 3.5 - 5.1 mmol/L   Chloride 108 98 - 111 mmol/L   CO2 22 22 - 32 mmol/L   Glucose, Bld 124 (H) 70 - 99 mg/dL   BUN 27 (H) 8 - 23 mg/dL   Creatinine, Ser 1.61 (H) 0.61 - 1.24 mg/dL   Calcium 8.5 (L) 8.9 - 10.3 mg/dL   Total Protein 6.7 6.5 - 8.1 g/dL   Albumin 3.5 3.5 - 5.0 g/dL   AST 32 15 - 41 U/L   ALT 22 0 - 44 U/L   Alkaline Phosphatase 160 (H) 38 - 126 U/L   Total Bilirubin 1.4 (H) 0.3 - 1.2 mg/dL   GFR calc non Af Amer 35 (L) >60 mL/min   GFR calc Af Amer 40 (L) >60 mL/min   Anion gap 9 5 - 15  Urinalysis, Complete w Microscopic      Status: Abnormal   Collection Time: 06/21/18  2:11 AM  Result Value Ref Range   Color, Urine AMBER (A) YELLOW   APPearance CLEAR (A) CLEAR   Specific Gravity, Urine 1.020 1.005 - 1.030   pH 5.0 5.0 - 8.0   Glucose, UA NEGATIVE NEGATIVE mg/dL   Hgb urine dipstick NEGATIVE NEGATIVE   Bilirubin Urine NEGATIVE NEGATIVE   Ketones, ur NEGATIVE NEGATIVE mg/dL   Protein, ur 096 (A) NEGATIVE mg/dL   Nitrite NEGATIVE NEGATIVE   Leukocytes, UA NEGATIVE NEGATIVE   RBC / HPF 6-10 0 - 5 RBC/hpf   WBC, UA 0-5 0 - 5 WBC/hpf   Bacteria, UA NONE SEEN NONE SEEN   Squamous Epithelial / LPF 0-5 0 - 5   Mucus PRESENT    ____________________________________________  EKG My review and personal interpretation at Time: 0:49   Indication: weakness  Rate: 60  Rhythm: sinus with 1st degree block Axis: normal Other: nonspecific st abn, no stemi ____________________________________________  RADIOLOGY  I personally reviewed all radiographic images ordered to evaluate for the above acute complaints and reviewed radiology reports and findings.  These findings were personally discussed with the patient.  Please see medical record for radiology report.  ____________________________________________   PROCEDURES  Procedure(s) performed:  Procedures    Critical Care performed: no ____________________________________________   INITIAL IMPRESSION / ASSESSMENT AND PLAN / ED COURSE  Pertinent labs & imaging results that were available during my care of the patient were reviewed by me and considered in my medical decision making (see chart for details).   DDX: tia, abn electrolytes, anemia, cva, arthritis, edema, anasarca  Larry Lawrence is a 77 y.o. who presents to the ED with symptoms as described above.  Symptoms onset several days ago.  Outside of the window for code  stroke and patient without any focal deficits at this time.  Possible TIA but patient does have significant risk factors for CVA.  The  patient will be placed on continuous pulse oximetry and telemetry for monitoring.  Laboratory evaluation will be sent to evaluate for the above complaints.     Clinical Course as of Jun 21 530  Sun Jun 21, 2018  0254 Further discussion with the patient and family has been complaining of left knee pain.  Very well may be worsening arthritis but given his risk factors will order MRI to evaluate for stroke.  Family and patient state they prefer to be managed as an outpatient.  Informed the plan for MRI if that is negative they will be appropriate for discharge home.   [PR]  0519 MRI without evidence of acute event.  After discussion with family and patient he would prefer to go home.  Very well is having worsening arthritis which I will give him follow-up with orthopedics.  Discussed signs and symptoms for which he should return to the ER.  Will be given referral to neurology as well.   [PR]    Clinical Course User Index [PR] Willy Eddyobinson, Kasumi Ditullio, MD     As part of my medical decision making, I reviewed the following data within the electronic MEDICAL RECORD NUMBER Nursing notes reviewed and incorporated, Labs reviewed, notes from prior ED visits and [place] Controlled Substance Database   ____________________________________________   FINAL CLINICAL IMPRESSION(S) / ED DIAGNOSES  Final diagnoses:  Chronic pain of left knee  Weakness      NEW MEDICATIONS STARTED DURING THIS VISIT:  New Prescriptions   No medications on file     Note:  This document was prepared using Dragon voice recognition software and may include unintentional dictation errors.    Willy Eddyobinson, Avionna Bower, MD 06/21/18 [phone]

## 2018-06-21 NOTE — ED Notes (Signed)
Patient transported to MRI 

## 2018-06-26 NOTE — Telephone Encounter (Signed)
Tried to call patient to schedule AWV-S. No answer.  Voicemail box not set up yet.  Last AWV 06/25/17. lec

## 2018-06-30 ENCOUNTER — Emergency Department: Payer: Medicare Other

## 2018-06-30 ENCOUNTER — Encounter: Payer: Self-pay | Admitting: Emergency Medicine

## 2018-06-30 ENCOUNTER — Other Ambulatory Visit: Payer: Self-pay

## 2018-06-30 ENCOUNTER — Ambulatory Visit: Payer: Medicare Other | Admitting: Family Medicine

## 2018-06-30 ENCOUNTER — Inpatient Hospital Stay
Admission: EM | Admit: 2018-06-30 | Discharge: 2018-07-03 | DRG: 291 | Disposition: A | Discharge status: Home / Self Care | Payer: Medicare Other | Attending: Internal Medicine | Admitting: Internal Medicine

## 2018-06-30 DIAGNOSIS — I429 Cardiomyopathy, unspecified: Secondary | ICD-10-CM | POA: Diagnosis not present

## 2018-06-30 DIAGNOSIS — M109 Gout, unspecified: Secondary | ICD-10-CM | POA: Diagnosis not present

## 2018-06-30 DIAGNOSIS — E876 Hypokalemia: Secondary | ICD-10-CM | POA: Diagnosis not present

## 2018-06-30 DIAGNOSIS — R001 Bradycardia, unspecified: Secondary | ICD-10-CM | POA: Diagnosis not present

## 2018-06-30 DIAGNOSIS — Z955 Presence of coronary angioplasty implant and graft: Secondary | ICD-10-CM

## 2018-06-30 DIAGNOSIS — G4700 Insomnia, unspecified: Secondary | ICD-10-CM | POA: Diagnosis present

## 2018-06-30 DIAGNOSIS — Z8249 Family history of ischemic heart disease and other diseases of the circulatory system: Secondary | ICD-10-CM | POA: Diagnosis not present

## 2018-06-30 DIAGNOSIS — Z800 Family history of malignant neoplasm of digestive organs: Secondary | ICD-10-CM | POA: Diagnosis not present

## 2018-06-30 DIAGNOSIS — Z8673 Personal history of transient ischemic attack (TIA), and cerebral infarction without residual deficits: Secondary | ICD-10-CM

## 2018-06-30 DIAGNOSIS — N189 Chronic kidney disease, unspecified: Secondary | ICD-10-CM | POA: Diagnosis not present

## 2018-06-30 DIAGNOSIS — Z79899 Other long term (current) drug therapy: Secondary | ICD-10-CM

## 2018-06-30 DIAGNOSIS — Z66 Do not resuscitate: Secondary | ICD-10-CM | POA: Diagnosis not present

## 2018-06-30 DIAGNOSIS — Z8042 Family history of malignant neoplasm of prostate: Secondary | ICD-10-CM

## 2018-06-30 DIAGNOSIS — Z888 Allergy status to other drugs, medicaments and biological substances status: Secondary | ICD-10-CM | POA: Diagnosis not present

## 2018-06-30 DIAGNOSIS — E785 Hyperlipidemia, unspecified: Secondary | ICD-10-CM | POA: Diagnosis not present

## 2018-06-30 DIAGNOSIS — R778 Other specified abnormalities of plasma proteins: Secondary | ICD-10-CM

## 2018-06-30 DIAGNOSIS — I130 Hypertensive heart and chronic kidney disease with heart failure and stage 1 through stage 4 chronic kidney disease, or unspecified chronic kidney disease: Secondary | ICD-10-CM | POA: Diagnosis not present

## 2018-06-30 DIAGNOSIS — R7989 Other specified abnormal findings of blood chemistry: Secondary | ICD-10-CM | POA: Diagnosis not present

## 2018-06-30 DIAGNOSIS — I2510 Atherosclerotic heart disease of native coronary artery without angina pectoris: Secondary | ICD-10-CM | POA: Diagnosis present

## 2018-06-30 DIAGNOSIS — I440 Atrioventricular block, first degree: Secondary | ICD-10-CM | POA: Diagnosis not present

## 2018-06-30 DIAGNOSIS — I509 Heart failure, unspecified: Secondary | ICD-10-CM

## 2018-06-30 DIAGNOSIS — I10 Essential (primary) hypertension: Secondary | ICD-10-CM | POA: Diagnosis not present

## 2018-06-30 DIAGNOSIS — N183 Chronic kidney disease, stage 3 (moderate): Secondary | ICD-10-CM | POA: Diagnosis not present

## 2018-06-30 DIAGNOSIS — Z87891 Personal history of nicotine dependence: Secondary | ICD-10-CM | POA: Diagnosis not present

## 2018-06-30 DIAGNOSIS — R9431 Abnormal electrocardiogram [ECG] [EKG]: Secondary | ICD-10-CM | POA: Diagnosis not present

## 2018-06-30 DIAGNOSIS — I5023 Acute on chronic systolic (congestive) heart failure: Secondary | ICD-10-CM | POA: Diagnosis not present

## 2018-06-30 DIAGNOSIS — I252 Old myocardial infarction: Secondary | ICD-10-CM | POA: Diagnosis not present

## 2018-06-30 DIAGNOSIS — R531 Weakness: Secondary | ICD-10-CM | POA: Diagnosis not present

## 2018-06-30 LAB — CBC WITH DIFFERENTIAL/PLATELET
Abs Immature Granulocytes: 0.02 10*3/uL (ref 0.00–0.07)
Basophils Absolute: 0 10*3/uL (ref 0.0–0.1)
Basophils Relative: 0 %
EOS PCT: 5 %
Eosinophils Absolute: 0.2 10*3/uL (ref 0.0–0.5)
HCT: 40.3 % (ref 39.0–52.0)
Hemoglobin: 12.6 g/dL — ABNORMAL LOW (ref 13.0–17.0)
Immature Granulocytes: 0 %
Lymphocytes Relative: 16 %
Lymphs Abs: 0.8 10*3/uL (ref 0.7–4.0)
MCH: 29.4 pg (ref 26.0–34.0)
MCHC: 31.3 g/dL (ref 30.0–36.0)
MCV: 94.2 fL (ref 80.0–100.0)
MONOS PCT: 8 %
Monocytes Absolute: 0.4 10*3/uL (ref 0.1–1.0)
Neutro Abs: 3.6 10*3/uL (ref 1.7–7.7)
Neutrophils Relative %: 71 %
Platelets: 188 10*3/uL (ref 150–400)
RBC: 4.28 MIL/uL (ref 4.22–5.81)
RDW: 16.2 % — ABNORMAL HIGH (ref 11.5–15.5)
WBC: 5 10*3/uL (ref 4.0–10.5)
nRBC: 0 % (ref 0.0–0.2)

## 2018-06-30 LAB — URINALYSIS, COMPLETE (UACMP) WITH MICROSCOPIC
Bacteria, UA: NONE SEEN
Bilirubin Urine: NEGATIVE
Glucose, UA: NEGATIVE mg/dL
Ketones, ur: NEGATIVE mg/dL
Leukocytes, UA: NEGATIVE
Nitrite: NEGATIVE
Protein, ur: 30 mg/dL — AB
Specific Gravity, Urine: 1.014 (ref 1.005–1.030)
Squamous Epithelial / HPF: NONE SEEN (ref 0–5)
pH: 6 (ref 5.0–8.0)

## 2018-06-30 LAB — COMPREHENSIVE METABOLIC PANEL
ALT: 20 U/L (ref 0–44)
AST: 24 U/L (ref 15–41)
Albumin: 3.3 g/dL — ABNORMAL LOW (ref 3.5–5.0)
Alkaline Phosphatase: 143 U/L — ABNORMAL HIGH (ref 38–126)
Anion gap: 8 (ref 5–15)
BILIRUBIN TOTAL: 1.4 mg/dL — AB (ref 0.3–1.2)
BUN: 23 mg/dL (ref 8–23)
CO2: 24 mmol/L (ref 22–32)
Calcium: 8.7 mg/dL — ABNORMAL LOW (ref 8.9–10.3)
Chloride: 111 mmol/L (ref 98–111)
Creatinine, Ser: 1.7 mg/dL — ABNORMAL HIGH (ref 0.61–1.24)
GFR calc Af Amer: 45 mL/min — ABNORMAL LOW (ref >60)
GFR calc non Af Amer: 39 mL/min — ABNORMAL LOW (ref >60)
Glucose, Bld: 120 mg/dL — ABNORMAL HIGH (ref 70–99)
Potassium: 3.7 mmol/L (ref 3.5–5.1)
Sodium: 143 mmol/L (ref 135–145)
Total Protein: 6.5 g/dL (ref 6.5–8.1)

## 2018-06-30 LAB — TROPONIN I

## 2018-06-30 LAB — BRAIN NATRIURETIC PEPTIDE: B Natriuretic Peptide: 2374 pg/mL — ABNORMAL HIGH (ref 0.0–100.0)

## 2018-06-30 MED ORDER — ENOXAPARIN SODIUM 40 MG/0.4ML ~~LOC~~ SOLN
40.0000 mg | SUBCUTANEOUS | Status: DC
  Administered 2018-06-30 – 2018-07-02 (×3): 40 mg via SUBCUTANEOUS
  Filled 2018-06-30 (×4): qty 0.4

## 2018-06-30 MED ORDER — ONDANSETRON HCL 4 MG PO TABS: 4.0000 mg | ORAL_TABLET | Freq: Four times a day (QID) | ORAL | Status: DC | PRN

## 2018-06-30 MED ORDER — ACETAMINOPHEN 325 MG PO TABS: 650.0000 mg | ORAL_TABLET | Freq: Four times a day (QID) | ORAL | Status: DC | PRN

## 2018-06-30 MED ORDER — SODIUM CHLORIDE 0.9% FLUSH
3.0000 mL | Freq: Two times a day (BID) | INTRAVENOUS | Status: DC
  Administered 2018-06-30: 3 mL via INTRAVENOUS

## 2018-06-30 MED ORDER — ONDANSETRON HCL 4 MG/2ML IJ SOLN: 4.0000 mg | Freq: Four times a day (QID) | INTRAMUSCULAR | Status: DC | PRN

## 2018-06-30 MED ORDER — NITROGLYCERIN 2 % TD OINT
1.0000 [in_us] | TOPICAL_OINTMENT | Freq: Once | TRANSDERMAL | Status: AC
  Administered 2018-06-30: 1 [in_us] via TOPICAL
  Filled 2018-06-30: qty 1

## 2018-06-30 MED ORDER — ACETAMINOPHEN 650 MG RE SUPP: 650.0000 mg | Freq: Four times a day (QID) | RECTAL | Status: DC | PRN

## 2018-06-30 MED ORDER — FUROSEMIDE 10 MG/ML IJ SOLN
40.0000 mg | Freq: Three times a day (TID) | INTRAMUSCULAR | Status: DC
  Administered 2018-06-30 – 2018-07-03 (×8): 40 mg via INTRAVENOUS
  Filled 2018-06-30 (×8): qty 4

## 2018-06-30 MED ORDER — SODIUM CHLORIDE 0.9% FLUSH: 3.0000 mL | INTRAVENOUS | Status: DC | PRN

## 2018-06-30 MED ORDER — FUROSEMIDE 10 MG/ML IJ SOLN
80.0000 mg | Freq: Once | INTRAMUSCULAR | Status: AC
  Administered 2018-06-30: 80 mg via INTRAVENOUS
  Filled 2018-06-30: qty 8

## 2018-06-30 MED ORDER — SODIUM CHLORIDE 0.9 % IV SOLN: 250.0000 mL | INTRAVENOUS | Status: DC | PRN

## 2018-06-30 NOTE — ED Notes (Signed)
 urine output ast this time, pt had also soiled brief

## 2018-06-30 NOTE — ED Notes (Signed)
waiting on IV team consult to obtain blood and iv access.

## 2018-06-30 NOTE — ED Notes (Signed)
First Nurse Note: Patient to Rm 14 via WC with Tommy Tech.  Rosey Batheresa RN aware of room placement and IV Team consult.

## 2018-06-30 NOTE — ED Provider Notes (Addendum)
[facility] Emergency Department Provider Note   ____________________________________________   First MD Initiated Contact with Patient 06/30/18 1107     (approximate)  I have reviewed the triage vital signs and the nursing notes.   HISTORY  Chief Complaint Leg Swelling   HPI Larry Lawrence is a 77 y.o. male who complains of increasing swelling in his legs and shortness of breath with walking.  He says he cannot walk more than about 10 or 15 feet without getting short of breath.  He does have a history of congestive heart failure he takes Lasix every day.  His legs are now swelling and using his primary care doc sent him here for evaluation and to draw the fluid off   Past Medical History:  Diagnosis Date  . Allergy   . CHF (congestive heart failure) (HCC)   . Coronary artery disease   . Gout   . Insomnia   . Renal disorder   . Sleeps in sitting position due to orthopnea   . TIA (transient ischemic attack)     Patient Active Problem List   Diagnosis Date Noted  . Near syncope 04/24/2017  . Bradycardia 04/24/2017  . Gout 02/01/2016  . Syncope 08/09/2015  . Hypokalemia 08/09/2015  . CKD (chronic kidney disease), stage III (HCC) 02/20/2015  . Chronic systolic CHF (congestive heart failure) (HCC) 02/20/2015  . CKD (chronic kidney disease) 02/20/2015    Past Surgical History:  Procedure Laterality Date  . CARDIAC CATHETERIZATION N/A 02/02/2016   Procedure: Left Heart Cath and Coronary Angiography;  Surgeon: Lamar Blinks, MD;  Location: [facility] INVASIVE CV LAB;  Service: Cardiovascular;  Laterality: N/A;  . CARDIAC SURGERY     3 stents in "main artery"  . JOINT REPLACEMENT      Prior to Admission medications   Medication Sig Start Date End Date Taking? Authorizing Provider  allopurinol (ZYLOPRIM) 300 MG tablet TAKE (1) TABLET BY MOUTH TWICE DAILY 05/08/18   Duanne Limerick, MD  amiodarone (PACERONE) 200 MG tablet Take 200 mg by mouth daily.  Dr Gwen Pounds    [provider]  atorvastatin (LIPITOR) 20 MG tablet Take 1 tablet by mouth daily. [place] 01/05/18 01/05/19  [provider]  carvedilol (COREG) 25 MG tablet Take 25 mg by mouth 2 (two) times daily with a meal. Dr Gwen Pounds    [provider]  furosemide (LASIX) 40 MG tablet Take 1 tablet (40 mg total) by mouth 2 (two) times daily. Take 2 tabs in the morning Patient taking differently: Take 40 mg by mouth 2 (two) times daily. Take 2 tabs in the morning/ Dr Gwen Pounds 02/05/16   Alford Highland, MD  hydrALAZINE (APRESOLINE) 25 MG tablet Take 1 tablet (25 mg total) by mouth every 8 (eight) hours. Patient taking differently: Take 50 mg by mouth every 8 (eight) hours.  04/25/17   Auburn Bilberry, MD  isosorbide mononitrate (IMDUR) 30 MG 24 hr tablet Take 30 mg by mouth daily. Dr Gwen Pounds    [provider]  loratadine (CLARITIN) 10 MG tablet Take 1 tablet (10 mg total) by mouth daily. Patient taking differently: Take 10 mg by mouth daily as needed for allergies.  08/06/16   Duanne Limerick, MD  mometasone (NASONEX) 50 MCG/ACT nasal spray Place 2 sprays into the nose daily. 09/29/17   Duanne Limerick, MD  montelukast (SINGULAIR) 10 MG tablet TAKE ONE TABLET AT BEDTIME. 04/03/18   Duanne Limerick, MD  potassium chloride SA (K-DUR,KLOR-CON)  20 MEQ tablet Take 1 tablet (20 mEq total) by mouth 3 (three) times daily. Patient taking differently: Take 20 mEq by mouth 3 (three) times daily. Dr Gwen PoundsKowalski 08/06/16   Duanne LimerickJones, Deanna C, MD  sacubitril-valsartan (ENTRESTO) 24-26 MG Take 1 tablet by mouth 2 (two) times daily.  03/31/17   [provider]  timolol (TIMOPTIC) 0.5 % ophthalmic solution Place 1 drop into both eyes 2 (two) times daily. Dr Inez PilgrimBrasington    [provider]  torsemide (DEMADEX) 20 MG tablet Take 2 tablets (40 mg total) by mouth daily. 05/28/18   Sharyn CreamerQuale, Mark, MD    Allergies Niaspan [niacin er]  Family History  Problem Relation Age of Onset   . Hypertension Mother   . Pancreatic cancer Mother   . Hypertension Father   . Prostate cancer Father     Social History Social History   Tobacco Use  . Smoking status: Former Smoker    Packs/day: 2.00    Years: 3.00    Pack years: 6.00    Types: Cigarettes    Last attempt to quit: 1960    Years since quitting: 60.0  . Smokeless tobacco: Never Used  . Tobacco comment: Smoking cessation materials not required  Substance Use Topics  . Alcohol use: No  . Drug use: No    Review of Systems  Constitutional: No fever/chills Eyes: No visual changes. ENT: No sore throat. Cardiovascular: Denies chest pain. Respiratory:  shortness of breath. Gastrointestinal: No abdominal pain.  No nausea, no vomiting.  No diarrhea.  No constipation. Genitourinary: Negative for dysuria. Musculoskeletal: Negative for back pain. Skin: Negative for rash. Neurological: Negative for headaches, focal weakness ____________________________________________   PHYSICAL EXAM:  VITAL SIGNS: ED Triage Vitals  Enc Vitals Group     BP 06/30/18 1000 (!) 146/69     Pulse Rate 06/30/18 1000 (!) 58     Resp 06/30/18 1000 14     Temp 06/30/18 1000 98 F (36.7 C)     Temp Source 06/30/18 1000 Oral     SpO2 06/30/18 1000 96 %     Weight 06/30/18 1001 238 lb 1.6 oz (108 kg)     Height 06/30/18 1001 5\' 11"  (1.803 m)     Head Circumference --      Peak Flow --      Pain Score 06/30/18 1001 0     Pain Loc --      Pain Edu? --      Excl. in GC? --     Constitutional: Alert and oriented. Well appearing and in no acute distress. Eyes: Conjunctivae are normal.  Head: Atraumatic. Nose: No congestion/rhinnorhea. Mouth/Throat: Mucous membranes are moist.  Oropharynx non-erythematous. Neck: No stridor.   Cardiovascular: Normal rate, regular rhythm. Grossly normal heart sounds.  Good peripheral circulation. Respiratory: Normal respiratory effort.  No retractions. Lungs CTAB. Gastrointestinal: Soft and  nontender. No distention. No abdominal bruits. No CVA tenderness. Musculoskeletal: No lower extremity tenderness 2+ edema.   Neurologic:  Normal speech and language. No gross focal neurologic deficits are appreciated.  Skin:  Skin is warm, dry and intact. No rash noted. Psychiatric: Mood and affect are normal. Speech and behavior are normal.  ____________________________________________   LABS (all labs ordered are listed, but only abnormal results are displayed)  Labs Reviewed  COMPREHENSIVE METABOLIC PANEL - Abnormal; Notable for the following components:      Result Value   Glucose, Bld 120 (*)    Creatinine, Ser 1.70 (*)  Calcium 8.7 (*)    Albumin 3.3 (*)    Alkaline Phosphatase 143 (*)    Total Bilirubin 1.4 (*)    GFR calc non Af Amer 39 (*)    GFR calc Af Amer 45 (*)    All other components within normal limits  BRAIN NATRIURETIC PEPTIDE - Abnormal; Notable for the following components:   B Natriuretic Peptide 2,374.0 (*)    All other components within normal limits  CBC WITH DIFFERENTIAL/PLATELET - Abnormal; Notable for the following components:   Hemoglobin 12.6 (*)    RDW 16.2 (*)    All other components within normal limits  URINALYSIS, COMPLETE (UACMP) WITH MICROSCOPIC - Abnormal; Notable for the following components:   Color, Urine YELLOW (*)    APPearance CLEAR (*)    Hgb urine dipstick SMALL (*)    Protein, ur 30 (*)    All other components within normal limits  TROPONIN I   ____________________________________________  EKG  EKG read interpreted by me shows sinus bradycardia rate of 59 normal axis very low amplitude in the limb leads and many of the chest leads as well first-degree AV block ____________________________________________  RADIOLOGY  ED MD interpretation: Chest x-ray read by radiology reviewed by me shows CHF  Official radiology report(s): Dg Chest 2 View  Result Date: 06/30/2018 CLINICAL DATA:  Bilateral leg swelling. Worsening  shortness of breath. EXAM: CHEST - 2 VIEW COMPARISON:  06/21/2018. FINDINGS: Cardiomegaly with bilateral from interstitial prominence and bilateral pleural effusions. Findings consistent CHF. Low lung volumes with basilar atelectasis. IMPRESSION: Congestive heart failure bilateral pulmonary interstitial edema and bilateral pleural effusions. Low lung volumes with bibasilar atelectasis. Electronically Signed   By: Maisie Fushomas  Register   On: 06/30/2018 10:25    ____________________________________________   PROCEDURES  Procedure(s) performed:   Procedures  Critical Care performed:   ____________________________________________   INITIAL IMPRESSION / ASSESSMENT AND PLAN / ED COURSE  After multiple tries were able to get the blood work done an IV in place gave the patient some Lasix.  Patient has a chest x-ray consistent with CHF as well as his edema which is oozing and of course his BNP which is over 2000.  He has been on Lasix but it is not working.  Additionally he has some hypertension and bradycardia.  We will give him some Nitropaste on top of his Lasix and then work on getting him in the hospital.  ----------------------------------------- 3:05 PM on 06/30/2018 -----------------------------------------  Patient's mental status continues to wax and wane just a few minutes ago she was completely unresponsive now she is moving around shaking her head nodding her head in response to questions.  We will get another blood gas to see what her pH is doing check her fingerstick and give her some more Lasix.  Her most form says to do CPR but not to intubate her.  So we will not intubate her at this point.  Hospitalist is already seen the patient but has not made to get a hold of family.       ____________________________________________   FINAL CLINICAL IMPRESSION(S) / ED DIAGNOSES  Final diagnoses:  Acute on chronic congestive heart failure, unspecified heart failure type (HCC)  Chronic  renal failure, unspecified CKD stage  Bradycardia  Elevated troponin     ED Discharge Orders    None       Note:  This document was prepared using Dragon voice recognition software and may include unintentional dictation errors.    Dorothea GlassmanMalinda,   F, MD 06/30/18 1452    Arnaldo Natal, MD 06/30/18 [phone]

## 2018-06-30 NOTE — ED Triage Notes (Signed)
Patient was sent by PCP for swelling in legs. Weeping noted in right leg. Takes lasix at home but has not had relief. Patient also complaining of worsening shortness of breath.

## 2018-06-30 NOTE — ED Notes (Signed)
Iv team at bedside at this time 

## 2018-06-30 NOTE — Consult Note (Signed)
[facility] Cardiology Consultation Note  Patient ID: Larry Lawrence, MRN: [id number], DOB/AGE: [birth year] 77 y.o. Admit date: 06/30/2018   Date of Consult: 06/30/2018 Primary Physician: Duanne Limerick, MD Primary Cardiologist: Gwen Pounds  Chief Complaint:  Chief Complaint  Patient presents with  . Leg Swelling   Reason for Consult: Heart failure  HPI: 77 y.o. male with known chronic systolic dysfunction congestive heart failure with ejection fraction of 25% of moderate valvular heart disease coronary artery disease chronic kidney disease stage III who has had a previous myocardial infarction.  The patient has had appropriate medication management for his cardiomyopathy and heart failure although does continue to have waxing and waning of significant heart failure issues.  Is not clear whether his dietary indiscretion is the primary cause or glomerular filtration rate lowering.  He did arrive with the emergency room showing an EKG of sinus bradycardia with first-degree AV block and septal myocardial infarction.  Troponin level was normal and BNP was 2374.  The patient has received intravenous Lasix with some improvements of symptoms but severe lower extremity edema which is chronic in nature and some upper extremity edema as well.  Currently he is hemodynamically stable without any chest pain  Past Medical History:  Diagnosis Date  . Allergy   . CHF (congestive heart failure) (HCC)   . Coronary artery disease   . Gout   . Insomnia   . Renal disorder   . Sleeps in sitting position due to orthopnea   . TIA (transient ischemic attack)       Surgical History:  Past Surgical History:  Procedure Laterality Date  . CARDIAC CATHETERIZATION N/A 02/02/2016   Procedure: Left Heart Cath and Coronary Angiography;  Surgeon: Lamar Blinks, MD;  Location: [facility] INVASIVE CV LAB;  Service: Cardiovascular;  Laterality: N/A;  . CARDIAC SURGERY     3 stents in "main artery"  . JOINT REPLACEMENT        Home Meds: Prior to Admission medications   Medication Sig Start Date End Date Taking? Authorizing Provider  allopurinol (ZYLOPRIM) 300 MG tablet TAKE (1) TABLET BY MOUTH TWICE DAILY Patient taking differently: Take 300 mg by mouth 2 (two) times daily.  05/08/18  Yes Duanne Limerick, MD  amiodarone (PACERONE) 200 MG tablet Take 200 mg by mouth daily.    Yes Lamar Blinks, MD  atorvastatin (LIPITOR) 20 MG tablet Take 20 mg by mouth daily.  01/05/18 01/05/19 Yes [provider]  carvedilol (COREG) 25 MG tablet Take 25 mg by mouth 2 (two) times daily with a meal.    Yes [provider]  furosemide (LASIX) 40 MG tablet Take 1 tablet (40 mg total) by mouth 2 (two) times daily. Take 2 tabs in the morning Patient taking differently: Take 80 mg by mouth daily.  02/05/16  Yes Wieting, Richard, MD  hydrALAZINE (APRESOLINE) 25 MG tablet Take 1 tablet (25 mg total) by mouth every 8 (eight) hours. Patient taking differently: Take 50 mg by mouth every 8 (eight) hours.  04/25/17  Yes Auburn Bilberry, MD  isosorbide mononitrate (IMDUR) 30 MG 24 hr tablet Take 30 mg by mouth daily.    Yes [provider]  loratadine (CLARITIN) 10 MG tablet Take 1 tablet (10 mg total) by mouth daily. Patient taking differently: Take 10 mg by mouth daily as needed for allergies.  08/06/16  Yes Duanne Limerick, MD  mometasone (NASONEX) 50 MCG/ACT nasal spray Place 2 sprays into the nose daily. 09/29/17  Yes Duanne Limerick, MD  montelukast (SINGULAIR) 10 MG tablet TAKE ONE TABLET AT BEDTIME. Patient taking differently: Take 10 mg by mouth at bedtime.  04/03/18  Yes Duanne Limerick, MD  potassium chloride SA (K-DUR,KLOR-CON) 20 MEQ tablet Take 1 tablet (20 mEq total) by mouth 3 (three) times daily. 08/06/16  Yes Duanne Limerick, MD  sacubitril-valsartan (ENTRESTO) 24-26 MG Take 1 tablet by mouth 2 (two) times daily.  03/31/17  Yes [provider]  timolol (TIMOPTIC) 0.5 % ophthalmic solution Place 1  drop into both eyes 2 (two) times daily. Dr Inez Pilgrim   Yes [provider]  torsemide (DEMADEX) 20 MG tablet Take 2 tablets (40 mg total) by mouth daily. 05/28/18   Sharyn Creamer, MD    Inpatient Medications:  . furosemide  40 mg Intravenous Q8H     Allergies:  Allergies  Allergen Reactions  . Niaspan [Niacin Er] Other (See Comments)    Reaction:  Hot flashes     Social History   Socioeconomic History  . Marital status: Married    Spouse name: Not on file  . Number of children: 2  . Years of education: Not on file  . Highest education level: 12th grade  Occupational History  . Occupation: retired  Engineer, production  . Financial resource strain: Not hard at all  . Food insecurity:    Worry: Never true    Inability: Never true  . Transportation needs:    Medical: No    Non-medical: No  Tobacco Use  . Smoking status: Former Smoker    Packs/day: 2.00    Years: 3.00    Pack years: 6.00    Types: Cigarettes    Last attempt to quit: 1960    Years since quitting: 60.0  . Smokeless tobacco: Never Used  . Tobacco comment: Smoking cessation materials not required  Substance and Sexual Activity  . Alcohol use: No  . Drug use: No  . Sexual activity: Not Currently  Lifestyle  . Physical activity:    Days per week: 0 days    Minutes per session: 0 min  . Stress: Not at all  Relationships  . Social connections:    Talks on phone: Twice a week    Gets together: Once a week    Attends religious service: More than 4 times per year    Active member of club or organization: No    Attends meetings of clubs or organizations: Never    Relationship status: Married  . Intimate partner violence:    Fear of current or ex partner: No    Emotionally abused: No    Physically abused: No    Forced sexual activity: No  Other Topics Concern  . Not on file  Social History Narrative  . Not on file     Family History  Problem Relation Age of Onset  . Hypertension Mother   .  Pancreatic cancer Mother   . Hypertension Father   . Prostate cancer Father      Review of Systems Positive for shortness of breath weight gain edema Negative for: General:  chills, fever, night sweats positive for weight changes.  Cardiovascular: PND orthopnea syncope dizziness  Dermatological skin lesions rashes Respiratory: Cough congestion Urologic: Frequent urination urination at night and hematuria Abdominal: negative for nausea, vomiting, diarrhea, bright red blood per rectum, melena, or hematemesis Neurologic: negative for visual changes, and/or hearing changes  All other systems reviewed and are otherwise negative except as noted  above.  Labs: Recent Labs    06/30/18 1345  TROPONINI <0.03   Lab Results  Component Value Date   WBC 5.0 06/30/2018   HGB 12.6 (L) 06/30/2018   HCT 40.3 06/30/2018   MCV 94.2 06/30/2018   PLT 188 06/30/2018    Recent Labs  Lab 06/30/18 1345  NA 143  K 3.7  CL 111  CO2 24  BUN 23  CREATININE 1.70*  CALCIUM 8.7*  PROT 6.5  BILITOT 1.4*  ALKPHOS 143*  ALT 20  AST 24  GLUCOSE 120*   No results found for: CHOL, HDL, LDLCALC, TRIG No results found for: DDIMER  Radiology/Studies:  Dg Chest 2 View  Result Date: 06/30/2018 CLINICAL DATA:  Bilateral leg swelling. Worsening shortness of breath. EXAM: CHEST - 2 VIEW COMPARISON:  06/21/2018. FINDINGS: Cardiomegaly with bilateral from interstitial prominence and bilateral pleural effusions. Findings consistent CHF. Low lung volumes with basilar atelectasis. IMPRESSION: Congestive heart failure bilateral pulmonary interstitial edema and bilateral pleural effusions. Low lung volumes with bibasilar atelectasis. Electronically Signed   By: Maisie Fushomas  Register   On: 06/30/2018 10:25   Ct Head Wo Contrast  Result Date: 06/21/2018 CLINICAL DATA:  Altered mental status, fall tonight. EXAM: CT HEAD WITHOUT CONTRAST TECHNIQUE: Contiguous axial images were obtained from the base of the skull through  the vertex without intravenous contrast. COMPARISON:  CT HEAD April 23, 2017 FINDINGS: BRAIN: No intraparenchymal hemorrhage, mass effect nor midline shift. Moderate parenchymal brain volume loss, no hydrocephalus. Confluent supratentorial white matter hypodensities. 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 Beam hardening artifact through lower pons. No acute large vascular territory infarcts. No abnormal extra-axial fluid collections. Basal cisterns are patent. VASCULAR: Moderate calcific atherosclerosis of the carotid siphons. SKULL: No skull fracture. No significant scalp soft tissue swelling. SINUSES/ORBITS: Chronic bilateral mastoiditis relatively stable. Trace paranasal sinus mucosal thickening. Dehiscent RIGHT jugular bulb. The included ocular globes and orbital contents are non-suspicious. Status post bilateral ocular lens implants. OTHER: None. IMPRESSION: 1. No acute intracranial process. 2. 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 Electronically Signed   By: Awilda Metroourtnay  Bloomer M.D.   On: 06/21/2018 01:59   Mr Brain Wo Contrast  Result Date: 06/21/2018 CLINICAL DATA:  Altered mental status, fell tonight. EXAM: MRI HEAD WITHOUT CONTRAST TECHNIQUE: Multiplanar, multiecho pulse sequences of the brain and surrounding structures were obtained without intravenous contrast. COMPARISON:  CT HEAD June 21, 2018 FINDINGS: Mild motion degraded examination. INTRACRANIAL CONTENTS: No reduced diffusion to suggest acute ischemia. 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 Old bilateral basal ganglia infarcts. Prominent basal ganglia perivascular spaces associated with chronic small vessel ischemic changes. Confluent supratentorial white matter FLAIR T2 hyperintensities. Moderate parenchymal brain volume loss. No hydrocephalus. No suspicious parenchymal signal, masses, mass effect. No abnormal  extra-axial fluid collections. No extra-axial masses. VASCULAR: Normal major intracranial vascular flow voids present at skull base. SKULL AND UPPER CERVICAL SPINE: No abnormal sellar expansion. No suspicious calvarial bone marrow signal. Craniocervical junction maintained. SINUSES/ORBITS: Trace LEFT mastoid effusion. The included ocular globes and orbital contents are non-suspicious. Status post bilateral ocular lens implants. OTHER: None. IMPRESSION: 1. No acute intracranial process. 2. Moderate to severe chronic small vessel ischemic changes. 3. 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 Electronically Signed   By: Awilda Metroourtnay  Bloomer M.D.   On: 06/21/2018 05:16   Dg Chest Portable 1 View  Result Date: 06/21/2018 CLINICAL DATA:  Pain and swelling after fall. EXAM: PORTABLE  CHEST 1 VIEW COMPARISON:  Chest radiograph May 20, 2018 FINDINGS: Stable cardiomegaly. Pulmonary vascular congestion mild interstitial prominence with increased patchy bibasilar airspace opacities and small pleural effusions. Calcified aortic arch. No pneumothorax. Osteopenia. IMPRESSION: Stable cardiomegaly. Interstitial edema confluent in lung bases versus atelectasis. Small pleural effusions. Aortic Atherosclerosis (ICD10-I70.0). Electronically Signed   By: Awilda Metroourtnay  Bloomer M.D.   On: 06/21/2018 01:53   Dg Knee Complete 4 Views Left  Result Date: 06/21/2018 CLINICAL DATA:  Pain and swelling after fall, assess for fracture. EXAM: LEFT KNEE - COMPLETE 4+ VIEW COMPARISON:  None. FINDINGS: No acute fracture deformity or dislocation. Severe lateral, moderate patellofemoral medial compartment narrowing with periarticular sclerosis and marginal spurring. Osteopenia. No destructive bony lesions. Moderate vascular calcifications. IMPRESSION: 1. No acute fracture deformity or dislocation. 2. Tricompartmental osteoarthrosis, severe within lateral compartment. Electronically Signed   By: Awilda Metroourtnay  Bloomer M.D.   On: 06/21/2018  01:52    EKG: Sinus bradycardia first-degree AV block and septal myocardial infarction  Weights: Filed Weights   06/30/18 1001  Weight: 108 kg     Physical Exam: Blood pressure (!) 164/69, pulse (!) 56, temperature 98 F (36.7 C), temperature source Oral, resp. rate 17, height 5\' 11"  (1.803 m), weight 108 kg, SpO2 98 %. Body mass index is 33.21 kg/m. General: Well developed, well nourished, in no acute distress. Head eyes ears nose throat: Normocephalic, atraumatic, sclera non-icteric, no xanthomas, nares are without discharge. No apparent thyromegaly and/or mass  Lungs: Normal respiratory effort.  no wheezes, basilar rales, no rhonchi.  Heart: RRR with normal S1 S2. no murmur gallop, no rub, PMI is normal size and placement, carotid upstroke normal without bruit, jugular venous pressure is normal Abdomen: Soft, non-tender, non-distended with normoactive bowel sounds. No hepatomegaly. No rebound/guarding. No obvious abdominal masses. Abdominal aorta is normal size without bruit Extremities: 2+ edema. no cyanosis, no clubbing, n positive ulcers  Peripheral : 2+ bilateral upper extremity pulses, 2+ bilateral femoral pulses, 2+ bilateral dorsal pedal pulse Neuro: Alert and oriented. No facial asymmetry. No focal deficit. Moves all extremities spontaneously. Musculoskeletal: Normal muscle tone without kyphosis Psych:  Responds to questions appropriately with a normal affect.    Assessment: 77 year old male with acute on chronic systolic dysfunction congestive heart failure multifactorial in nature including dietary indiscretion chronic kidney disease and LV systolic dysfunction.  There is no current evidence of myocardial infarction  Plan: 1.  Continue intravenous Lasix for lower extremity edema pulmonary edema and acute on chronic systolic dysfunction heart failure 2.  Continue medication management for heart rate control and hypertension control watching for any significant  bradycardia 3.  No further cardiac diagnostics necessary due to known extent of congestive heart failure and ejection fraction with no current evidence of myocardial infarction 4.  Dietary consultation for further evaluation and treatment option for further risk reduction of readmissions  Signed, Lamar BlinksBruce J Loren Vicens M.D. [facility] Hospital[facility] 06/30/2018, 6:05 PM

## 2018-06-30 NOTE — H&P (Signed)
[employer] - [place] [facility]   PATIENT NAME: Larry Lawrence    MR#:  [id number]  DATE OF BIRTH:  1940/10/12  DATE OF ADMISSION:  06/30/2018  PRIMARY CARE PHYSICIAN: Duanne LimerickJones, Deanna C, MD   REQUESTING/REFERRING PHYSICIAN: Arnaldo NatalMalinda, Paul F, MD  CHIEF COMPLAINT:   Chief Complaint  Patient presents with  . Leg Swelling    HISTORY OF PRESENT ILLNESS: Larry Lawrence  is a 77 y.o. male with a known history of systolic CHF, coronary artery disease, gout, chronic kidney disease and TIA who is presenting with bilateral lower extremity swelling and shortness of breath.  Patient states that his swelling has gotten so worse that his legs are weeping.  He denies any chest pain or palpitations.  He sleeps propped up. PAST MEDICAL HISTORY:   Past Medical History:  Diagnosis Date  . Allergy   . CHF (congestive heart failure) (HCC)   . Coronary artery disease   . Gout   . Insomnia   . Renal disorder   . Sleeps in sitting position due to orthopnea   . TIA (transient ischemic attack)     PAST SURGICAL HISTORY:  Past Surgical History:  Procedure Laterality Date  . CARDIAC CATHETERIZATION N/A 02/02/2016   Procedure: Left Heart Cath and Coronary Angiography;  Surgeon: Lamar BlinksBruce J Kowalski, MD;  Location: [facility] INVASIVE CV LAB;  Service: Cardiovascular;  Laterality: N/A;  . CARDIAC SURGERY     3 stents in "main artery"  . JOINT REPLACEMENT      SOCIAL HISTORY:  Social History   Tobacco Use  . Smoking status: Former Smoker    Packs/day: 2.00    Years: 3.00    Pack years: 6.00    Types: Cigarettes    Last attempt to quit: 1960    Years since quitting: 60.0  . Smokeless tobacco: Never Used  . Tobacco comment: Smoking cessation materials not required  Substance Use Topics  . Alcohol use: No    FAMILY HISTORY:  Family History  Problem Relation Age of Onset  . Hypertension Mother   . Pancreatic cancer Mother   . Hypertension Father   . Prostate cancer Father     DRUG  ALLERGIES:  Allergies  Allergen Reactions  . Niaspan [Niacin Er] Other (See Comments)    Reaction:  Hot flashes     REVIEW OF SYSTEMS:   CONSTITUTIONAL: No fever, fatigue or weakness.  EYES: No blurred or double vision.  EARS, NOSE, AND THROAT: No tinnitus or ear pain.  RESPIRATORY: No cough, positive shortness of breath, no wheezing or hemoptysis.  CARDIOVASCULAR: No chest pain, orthopnea, edema.  GASTROINTESTINAL: No nausea, vomiting, diarrhea or abdominal pain.  GENITOURINARY: No dysuria, hematuria.  ENDOCRINE: No polyuria, nocturia,  HEMATOLOGY: No anemia, easy bruising or bleeding SKIN: No rash or lesion. MUSCULOSKELETAL: No joint pain or arthritis.   NEUROLOGIC: No tingling, numbness, weakness.  PSYCHIATRY: No anxiety or depression.   MEDICATIONS AT HOME:  Prior to Admission medications   Medication Sig Start Date End Date Taking? Authorizing Provider  allopurinol (ZYLOPRIM) 300 MG tablet TAKE (1) TABLET BY MOUTH TWICE DAILY 05/08/18   Duanne LimerickJones, Deanna C, MD  amiodarone (PACERONE) 200 MG tablet Take 200 mg by mouth daily. Dr Gwen PoundsKowalski    [provider]  atorvastatin (LIPITOR) 20 MG tablet Take 1 tablet by mouth daily. [place] 01/05/18 01/05/19  [provider]  carvedilol (COREG) 25 MG tablet Take 25 mg by mouth 2 (two) times daily with a meal. Dr  Gwen PoundsKowalski    [provider]  furosemide (LASIX) 40 MG tablet Take 1 tablet (40 mg total) by mouth 2 (two) times daily. Take 2 tabs in the morning Patient taking differently: Take 40 mg by mouth 2 (two) times daily. Take 2 tabs in the morning/ Dr Gwen PoundsKowalski 02/05/16   Alford HighlandWieting, Richard, MD  hydrALAZINE (APRESOLINE) 25 MG tablet Take 1 tablet (25 mg total) by mouth every 8 (eight) hours. Patient taking differently: Take 50 mg by mouth every 8 (eight) hours.  04/25/17   Auburn BilberryPatel, Juda Toepfer, MD  isosorbide mononitrate (IMDUR) 30 MG 24 hr tablet Take 30 mg by mouth daily. Dr Gwen PoundsKowalski    [provider]  loratadine  (CLARITIN) 10 MG tablet Take 1 tablet (10 mg total) by mouth daily. Patient taking differently: Take 10 mg by mouth daily as needed for allergies.  08/06/16   Duanne LimerickJones, Deanna C, MD  mometasone (NASONEX) 50 MCG/ACT nasal spray Place 2 sprays into the nose daily. 09/29/17   Duanne LimerickJones, Deanna C, MD  montelukast (SINGULAIR) 10 MG tablet TAKE ONE TABLET AT BEDTIME. 04/03/18   Duanne LimerickJones, Deanna C, MD  potassium chloride SA (K-DUR,KLOR-CON) 20 MEQ tablet Take 1 tablet (20 mEq total) by mouth 3 (three) times daily. Patient taking differently: Take 20 mEq by mouth 3 (three) times daily. Dr Gwen PoundsKowalski 08/06/16   Duanne LimerickJones, Deanna C, MD  sacubitril-valsartan (ENTRESTO) 24-26 MG Take 1 tablet by mouth 2 (two) times daily.  03/31/17   [provider]  timolol (TIMOPTIC) 0.5 % ophthalmic solution Place 1 drop into both eyes 2 (two) times daily. Dr Inez PilgrimBrasington    [provider]  torsemide (DEMADEX) 20 MG tablet Take 2 tablets (40 mg total) by mouth daily. 05/28/18   Sharyn CreamerQuale, Mark, MD      PHYSICAL EXAMINATION:   VITAL SIGNS: Blood pressure (!) 160/80, pulse (!) 49, temperature 98 F (36.7 C), temperature source Oral, resp. rate 17, height 5\' 11"  (1.803 m), weight 108 kg, SpO2 97 %.  GENERAL:  77 y.o.-year-old patient lying in the bed with no acute distress.  EYES: Pupils equal, round, reactive to light and accommodation. No scleral icterus. Extraocular muscles intact.  HEENT: Head atraumatic, normocephalic. Oropharynx and nasopharynx clear.  NECK:  Supple, no jugular venous distention. No thyroid enlargement, no tenderness.  LUNGS: Crackles at the bases without any accessory muscle usage CARDIOVASCULAR: S1, S2 normal. No murmurs, rubs, or gallops.  ABDOMEN: Soft, nontender, nondistended. Bowel sounds present. No organomegaly or mass.  EXTREMITIES: No pedal edema, cyanosis, or clubbing.  NEUROLOGIC: Cranial nerves II through XII are intact. Muscle strength 5/5 in all extremities. Sensation intact. Gait not checked.   PSYCHIATRIC: The patient is alert and oriented x 3.  SKIN: No obvious rash, lesion, or ulcer.   LABORATORY PANEL:   CBC Recent Labs  Lab 06/30/18 1345  WBC 5.0  HGB 12.6*  HCT 40.3  PLT 188  MCV 94.2  MCH 29.4  MCHC 31.3  RDW 16.2*  LYMPHSABS 0.8  MONOABS 0.4  EOSABS 0.2  BASOSABS 0.0   ------------------------------------------------------------------------------------------------------------------  Chemistries  Recent Labs  Lab 06/30/18 1345  NA 143  K 3.7  CL 111  CO2 24  GLUCOSE 120*  BUN 23  CREATININE 1.70*  CALCIUM 8.7*  AST 24  ALT 20  ALKPHOS 143*  BILITOT 1.4*   ------------------------------------------------------------------------------------------------------------------ estimated creatinine clearance is 46.9 mL/min (A) (by C-G formula based on SCr of 1.7 mg/dL (H)). ------------------------------------------------------------------------------------------------------------------ No results for input(s): TSH, T4TOTAL, T3FREE, THYROIDAB in the  last 72 hours.  Invalid input(s): FREET3   Coagulation profile No results for input(s): INR, PROTIME in the last 168 hours. ------------------------------------------------------------------------------------------------------------------- No results for input(s): DDIMER in the last 72 hours. -------------------------------------------------------------------------------------------------------------------  Cardiac Enzymes Recent Labs  Lab 06/30/18 1345  TROPONINI <0.03   ------------------------------------------------------------------------------------------------------------------ Invalid input(s): POCBNP  ---------------------------------------------------------------------------------------------------------------  Urinalysis    Component Value Date/Time   COLORURINE YELLOW (A) 06/30/2018 1316   APPEARANCEUR CLEAR (A) 06/30/2018 1316   LABSPEC 1.014 06/30/2018 1316   PHURINE 6.0  06/30/2018 1316   GLUCOSEU NEGATIVE 06/30/2018 1316   HGBUR SMALL (A) 06/30/2018 1316   BILIRUBINUR NEGATIVE 06/30/2018 1316   KETONESUR NEGATIVE 06/30/2018 1316   PROTEINUR 30 (A) 06/30/2018 1316   NITRITE NEGATIVE 06/30/2018 1316   LEUKOCYTESUR NEGATIVE 06/30/2018 1316     RADIOLOGY: Dg Chest 2 View  Result Date: 06/30/2018 CLINICAL DATA:  Bilateral leg swelling. Worsening shortness of breath. EXAM: CHEST - 2 VIEW COMPARISON:  06/21/2018. FINDINGS: Cardiomegaly with bilateral from interstitial prominence and bilateral pleural effusions. Findings consistent CHF. Low lung volumes with basilar atelectasis. IMPRESSION: Congestive heart failure bilateral pulmonary interstitial edema and bilateral pleural effusions. Low lung volumes with bibasilar atelectasis. Electronically Signed   By: Maisie Fus  Register   On: 06/30/2018 10:25    EKG: Orders placed or performed during the hospital encounter of 06/30/18  . ED EKG within 10 minutes  . ED EKG within 10 minutes    IMPRESSION AND PLAN: Patient is a 77 year old with history of chronic diastolic CHF, coronary artery disease, gout, history of TIA who is presenting with worsening shortness of breath and lower extremity swelling  1.  Acute on chronic systolic CHF we will treat with IV Lasix I will ask his primary cardiologist to see Patient will benefit from referral to outpatient congestive heart failure clinic on discharge for closer monitoring  2.  Coronary artery disease continue his home regimen including Coreg   3.  Hyperlipidemia continue Lipitor  4.  Hypertension resume his home regimen once medication reconciliation is done  5.  Miscellaneous Lovenox for DVT for prophylaxis     All the records are reviewed and case discussed with ED provider. Management plans discussed with the patient, family and they are in agreement.  CODE STATUS: Code Status History    Date Active Date Inactive Code Status Order ID Comments User Context    04/24/2017 0337 04/25/2017 1740 DNR [id number]  Bertrum Sol, MD Inpatient   04/24/2017 0249 04/24/2017 0337 DNR [id number]  Ihor Austin, MD ED   02/01/2016 2149 02/05/2016 1646 DNR [id number]  Oralia Manis, MD ED   08/09/2015 1644 08/11/2015 1654 DNR [id number]  Wyatt Haste, MD ED   08/09/2015 1643 08/09/2015 1644 Full Code [id number]  Clint Guy, Cletis Athens, MD ED   02/20/2015 [phone] 02/21/2015 1916 DNR [id number]  Crissie Figures, MD Inpatient    Questions for Most Recent Historical Code Status (Order [id number])    Question Answer Comment   In the event of cardiac or respiratory ARREST Do not call a "code blue"    In the event of cardiac or respiratory ARREST Do not perform Intubation, CPR, defibrillation or ACLS    In the event of cardiac or respiratory ARREST Use medication by any route, position, wound care, and other measures to relive pain and suffering. May use oxygen, suction and manual treatment of airway obstruction as needed for comfort.        TOTAL TIME TAKING CARE OF THIS PATIENT:19minutes.    Eber Ferrufino  Hajira Verhagen M.D on 06/30/2018 at 3:00 PM  Between 7am to 6pm - Pager - [phone]  After 6pm go to [email or web address] - password [email or web address]  [employer] Office  [phone]  CC: Primary care physician; Duanne Limerick, MD

## 2018-06-30 NOTE — Progress Notes (Signed)
Advanced care plan.  Purpose of the Encounter: CODE STATUS  Parties in Attendance:patient him self and son   Patient's Decision Capacity:intact  Subjective/Patient's story: Larry Lawrence  is a 77 y.o. male with a known history of systolic CHF, coronary artery disease, gout, chronic kidney disease and TIA who is presenting with bilateral lower extremity swelling and shortness of breath.    Objective/Medical story  I d/w patient regarding his desires for cardiac and pulmonary resuscitation.  Goals of care determination: Patient states that he would like to be DNR    CODE STATUS: DNR status confirmed   Time spent discussing advanced care planning: 16 minutes

## 2018-07-01 LAB — CBC
HCT: 35.6 % — ABNORMAL LOW (ref 39.0–52.0)
Hemoglobin: 11.2 g/dL — ABNORMAL LOW (ref 13.0–17.0)
MCH: 29.2 pg (ref 26.0–34.0)
MCHC: 31.5 g/dL (ref 30.0–36.0)
MCV: 92.7 fL (ref 80.0–100.0)
Platelets: 189 10*3/uL (ref 150–400)
RBC: 3.84 MIL/uL — ABNORMAL LOW (ref 4.22–5.81)
RDW: 16 % — ABNORMAL HIGH (ref 11.5–15.5)
WBC: 5.1 10*3/uL (ref 4.0–10.5)
nRBC: 0 % (ref 0.0–0.2)

## 2018-07-01 LAB — BASIC METABOLIC PANEL
Anion gap: 8 (ref 5–15)
BUN: 22 mg/dL (ref 8–23)
CO2: 24 mmol/L (ref 22–32)
Calcium: 8.2 mg/dL — ABNORMAL LOW (ref 8.9–10.3)
Chloride: 110 mmol/L (ref 98–111)
Creatinine, Ser: 1.73 mg/dL — ABNORMAL HIGH (ref 0.61–1.24)
GFR calc non Af Amer: 38 mL/min — ABNORMAL LOW (ref >60)
GFR, EST AFRICAN AMERICAN: 44 mL/min — AB (ref >60)
Glucose, Bld: 118 mg/dL — ABNORMAL HIGH (ref 70–99)
Potassium: 3.1 mmol/L — ABNORMAL LOW (ref 3.5–5.1)
Sodium: 142 mmol/L (ref 135–145)

## 2018-07-01 LAB — MAGNESIUM: Magnesium: 2 mg/dL (ref 1.7–2.4)

## 2018-07-01 MED ORDER — ISOSORBIDE MONONITRATE ER 30 MG PO TB24
30.0000 mg | ORAL_TABLET | Freq: Every day | ORAL | Status: DC
  Administered 2018-07-01 – 2018-07-03 (×3): 30 mg via ORAL
  Filled 2018-07-01 (×3): qty 1

## 2018-07-01 MED ORDER — TIMOLOL MALEATE 0.5 % OP SOLN
1.0000 [drp] | Freq: Two times a day (BID) | OPHTHALMIC | Status: DC
  Administered 2018-07-01 – 2018-07-03 (×5): 1 [drp] via OPHTHALMIC
  Filled 2018-07-01: qty 5

## 2018-07-01 MED ORDER — SODIUM CHLORIDE 0.9% FLUSH: 10.0000 mL | INTRAVENOUS | Status: DC | PRN

## 2018-07-01 MED ORDER — LORATADINE 10 MG PO TABS: 10.0000 mg | ORAL_TABLET | Freq: Every day | ORAL | Status: DC | PRN

## 2018-07-01 MED ORDER — HYDRALAZINE HCL 50 MG PO TABS
50.0000 mg | ORAL_TABLET | Freq: Three times a day (TID) | ORAL | Status: DC
  Administered 2018-07-01 – 2018-07-03 (×6): 50 mg via ORAL
  Filled 2018-07-01 (×6): qty 1

## 2018-07-01 MED ORDER — SENNA 8.6 MG PO TABS: 1.0000 | ORAL_TABLET | Freq: Every day | ORAL | Status: DC | PRN

## 2018-07-01 MED ORDER — SACUBITRIL-VALSARTAN 24-26 MG PO TABS
1.0000 | ORAL_TABLET | Freq: Two times a day (BID) | ORAL | Status: DC
  Administered 2018-07-01 – 2018-07-03 (×5): 1 via ORAL
  Filled 2018-07-01 (×5): qty 1

## 2018-07-01 MED ORDER — SODIUM CHLORIDE 0.9% FLUSH
10.0000 mL | Freq: Two times a day (BID) | INTRAVENOUS | Status: DC
  Administered 2018-07-01 – 2018-07-03 (×5): 10 mL via INTRAVENOUS

## 2018-07-01 MED ORDER — ATORVASTATIN CALCIUM 20 MG PO TABS
20.0000 mg | ORAL_TABLET | Freq: Every day | ORAL | Status: DC
  Administered 2018-07-01 – 2018-07-03 (×3): 20 mg via ORAL
  Filled 2018-07-01 (×3): qty 1

## 2018-07-01 MED ORDER — FLUTICASONE PROPIONATE 50 MCG/ACT NA SUSP
1.0000 | Freq: Every day | NASAL | Status: DC
  Administered 2018-07-03: 1 via NASAL
  Filled 2018-07-01: qty 16

## 2018-07-01 MED ORDER — ALLOPURINOL 300 MG PO TABS
300.0000 mg | ORAL_TABLET | Freq: Two times a day (BID) | ORAL | Status: DC
  Administered 2018-07-01 – 2018-07-03 (×5): 300 mg via ORAL
  Filled 2018-07-01 (×6): qty 1

## 2018-07-01 MED ORDER — POTASSIUM CHLORIDE CRYS ER 20 MEQ PO TBCR
20.0000 meq | EXTENDED_RELEASE_TABLET | Freq: Three times a day (TID) | ORAL | Status: DC
  Administered 2018-07-01 (×3): 20 meq via ORAL
  Filled 2018-07-01 (×3): qty 1

## 2018-07-01 MED ORDER — AMIODARONE HCL 200 MG PO TABS
200.0000 mg | ORAL_TABLET | Freq: Every day | ORAL | Status: DC
  Administered 2018-07-01 – 2018-07-03 (×3): 200 mg via ORAL
  Filled 2018-07-01 (×3): qty 1

## 2018-07-01 MED ORDER — CARVEDILOL 25 MG PO TABS
25.0000 mg | ORAL_TABLET | Freq: Two times a day (BID) | ORAL | Status: DC
  Administered 2018-07-02 – 2018-07-03 (×3): 25 mg via ORAL
  Filled 2018-07-01 (×3): qty 1

## 2018-07-01 MED ORDER — BISACODYL 5 MG PO TBEC: 5.0000 mg | DELAYED_RELEASE_TABLET | Freq: Every day | ORAL | Status: DC | PRN

## 2018-07-01 MED ORDER — MONTELUKAST SODIUM 10 MG PO TABS
10.0000 mg | ORAL_TABLET | Freq: Every day | ORAL | Status: DC
  Administered 2018-07-01 – 2018-07-02 (×2): 10 mg via ORAL
  Filled 2018-07-01 (×2): qty 1

## 2018-07-01 NOTE — Progress Notes (Signed)
Made dr. Imogene Burn aware patients heart rate is 53. Per md hold scheduled dose of Coreg 25mg  po.

## 2018-07-01 NOTE — Progress Notes (Signed)
[facility] Encounter Note  Patient: Larry Lawrence / Admit Date: 06/30/2018 / Date of Encounter: 07/01/2018, 8:16 AM   Subjective: Patient is feeling much better at this time with less shortness of breath and slight improvements of anemia.  After review it does appear the patient has been dietary compliant with a low-salt diet and continues to take his medications although may not have been taking appropriate medication management with a diuretic.  Previous history suggest that he was discharged on torsemide but was taking furosemide.  No evidence of myocardial infarction with normal troponin.  Telemetry continuing to show sinus bradycardia  Review of Systems: Positive for: Shortness of breath weakness and edema Negative for: Vision change, hearing change, syncope, dizziness, nausea, vomiting,diarrhea, bloody stool, stomach pain, cough, congestion, diaphoresis, urinary frequency, urinary pain,skin lesions, skin rashes Others previously listed  Objective: Telemetry: Sinus bradycardia Physical Exam: Blood pressure (!) 172/83, pulse (!) 56, temperature (!) 97.5 F (36.4 C), temperature source Oral, resp. rate 20, height 5\' 11"  (1.803 m), weight 105.1 kg, SpO2 99 %. Body mass index is 32.33 kg/m. General: Well developed, well nourished, in no acute distress. Head: Normocephalic, atraumatic, sclera non-icteric, no xanthomas, nares are without discharge. Neck: No apparent masses Lungs: Normal respirations with no wheezes, no rhonchi, no rales , basilar crackles   Heart: Regular rate and rhythm, normal S1 S2, no murmur, no rub, no gallop, PMI is normal size and placement, carotid upstroke normal without bruit, jugular venous pressure normal Abdomen: Soft, non-tender, non-distended with normoactive bowel sounds. No hepatosplenomegaly. Abdominal aorta is normal size without bruit Extremities: 2+ edema, no clubbing, no cyanosis, positive ulcers,  Peripheral: 2+ radial, 2+  femoral, 1 + dorsal pedal pulses Neuro: Alert and oriented. Moves all extremities spontaneously. Psych:  Responds to questions appropriately with a normal affect.   Intake/Output Summary (Last 24 hours) at 07/01/2018 0816 Last data filed at 07/01/2018 0500 Gross per 24 hour  Intake -  Output 2275 ml  Net -2275 ml    Inpatient Medications:  . enoxaparin (LOVENOX) injection  40 mg Subcutaneous Q24H  . furosemide  40 mg Intravenous Q8H  . sodium chloride flush  3 mL Intravenous Q12H   Infusions:  . sodium chloride      Labs: Recent Labs    06/30/18 1345 07/01/18 0323  NA 143 142  K 3.7 3.1*  CL 111 110  CO2 24 24  GLUCOSE 120* 118*  BUN 23 22  CREATININE 1.70* 1.73*  CALCIUM 8.7* 8.2*   Recent Labs    06/30/18 1345  AST 24  ALT 20  ALKPHOS 143*  BILITOT 1.4*  PROT 6.5  ALBUMIN 3.3*   Recent Labs    06/30/18 1345 07/01/18 0323  WBC 5.0 5.1  NEUTROABS 3.6  --   HGB 12.6* 11.2*  HCT 40.3 35.6*  MCV 94.2 92.7  PLT 188 189   Recent Labs    06/30/18 1345  TROPONINI <0.03   Invalid input(s): POCBNP No results for input(s): HGBA1C in the last 72 hours.   Weights: Filed Weights   06/30/18 1001 06/30/18 2230 07/01/18 0510  Weight: 108 kg 105.1 kg 105.1 kg     Radiology/Studies:  Dg Chest 2 View  Result Date: 06/30/2018 CLINICAL DATA:  Bilateral leg swelling. Worsening shortness of breath. EXAM: CHEST - 2 VIEW COMPARISON:  06/21/2018. FINDINGS: Cardiomegaly with bilateral from interstitial prominence and bilateral pleural effusions. Findings consistent CHF. Low lung volumes with basilar atelectasis. IMPRESSION: Congestive heart failure bilateral  pulmonary interstitial edema and bilateral pleural effusions. Low lung volumes with bibasilar atelectasis. Electronically Signed   By: Maisie Fus  Register   On: 06/30/2018 10:25   Ct Head Wo Contrast  Result Date: 06/21/2018 CLINICAL DATA:  Altered mental status, fall tonight. EXAM: CT HEAD WITHOUT CONTRAST  TECHNIQUE: Contiguous axial images were obtained from the base of the skull through the vertex without intravenous contrast. COMPARISON:  CT HEAD April 23, 2017 FINDINGS: BRAIN: No intraparenchymal hemorrhage, mass effect nor midline shift. Moderate parenchymal brain volume loss, no hydrocephalus. Confluent supratentorial white matter hypodensities. 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 Beam hardening artifact through lower pons. No acute large vascular territory infarcts. No abnormal extra-axial fluid collections. Basal cisterns are patent. VASCULAR: Moderate calcific atherosclerosis of the carotid siphons. SKULL: No skull fracture. No significant scalp soft tissue swelling. SINUSES/ORBITS: Chronic bilateral mastoiditis relatively stable. Trace paranasal sinus mucosal thickening. Dehiscent RIGHT jugular bulb. The included ocular globes and orbital contents are non-suspicious. Status post bilateral ocular lens implants. OTHER: None. IMPRESSION: 1. No acute intracranial process. 2. 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 Electronically Signed   By: Awilda Metro M.D.   On: 06/21/2018 01:59   Mr Brain Wo Contrast  Result Date: 06/21/2018 CLINICAL DATA:  Altered mental status, fell tonight. EXAM: MRI HEAD WITHOUT CONTRAST TECHNIQUE: Multiplanar, multiecho pulse sequences of the brain and surrounding structures were obtained without intravenous contrast. COMPARISON:  CT HEAD June 21, 2018 FINDINGS: Mild motion degraded examination. INTRACRANIAL CONTENTS: No reduced diffusion to suggest acute ischemia. 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 Old bilateral basal ganglia infarcts. Prominent basal ganglia perivascular spaces associated with chronic small vessel ischemic changes. Confluent supratentorial white matter FLAIR T2 hyperintensities. Moderate parenchymal brain volume loss. No  hydrocephalus. No suspicious parenchymal signal, masses, mass effect. No abnormal extra-axial fluid collections. No extra-axial masses. VASCULAR: Normal major intracranial vascular flow voids present at skull base. SKULL AND UPPER CERVICAL SPINE: No abnormal sellar expansion. No suspicious calvarial bone marrow signal. Craniocervical junction maintained. SINUSES/ORBITS: Trace LEFT mastoid effusion. The included ocular globes and orbital contents are non-suspicious. Status post bilateral ocular lens implants. OTHER: None. IMPRESSION: 1. No acute intracranial process. 2. Moderate to severe chronic small vessel ischemic changes. 3. 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 Electronically Signed   By: Awilda Metro M.D.   On: 06/21/2018 05:16   Dg Chest Portable 1 View  Result Date: 06/21/2018 CLINICAL DATA:  Pain and swelling after fall. EXAM: PORTABLE CHEST 1 VIEW COMPARISON:  Chest radiograph May 20, 2018 FINDINGS: Stable cardiomegaly. Pulmonary vascular congestion mild interstitial prominence with increased patchy bibasilar airspace opacities and small pleural effusions. Calcified aortic arch. No pneumothorax. Osteopenia. IMPRESSION: Stable cardiomegaly. Interstitial edema confluent in lung bases versus atelectasis. Small pleural effusions. Aortic Atherosclerosis (ICD10-I70.0). Electronically Signed   By: Awilda Metro M.D.   On: 06/21/2018 01:53   Dg Knee Complete 4 Views Left  Result Date: 06/21/2018 CLINICAL DATA:  Pain and swelling after fall, assess for fracture. EXAM: LEFT KNEE - COMPLETE 4+ VIEW COMPARISON:  None. FINDINGS: No acute fracture deformity or dislocation. Severe lateral, moderate patellofemoral medial compartment narrowing with periarticular sclerosis and marginal spurring. Osteopenia. No destructive bony lesions. Moderate vascular calcifications. IMPRESSION: 1. No acute fracture deformity or dislocation. 2. Tricompartmental osteoarthrosis, severe within lateral  compartment. Electronically Signed   By: Awilda Metro M.D.   On: 06/21/2018 01:52     Assessment and Recommendation  78 y.o. male with acute on chronic systolic dysfunction congestive heart failure with bradycardia pulmonary edema chronic kidney disease and no current evidence of myocardial infarction slightly improved from admission 1.  Continue intravenous furosemide for lower extremity edema pulmonary edema and acute on chronic systolic dysfunction heart failure 2.  When discharged patient will have change to torsemide for diuretic further adjustments to diuresis due to patient apparently being relatively compliant with low-salt diet 3.  Reinstatement of medication management for cardiomyopathy including Entresto and carvedilol as able 4.  No further cardiac diagnostics necessary at this time due to no evidence of myocardial infarction and continued non-ledge cardiogram showing severe LV systolic dysfunction with ejection fraction of 25% 5.  Further treatment options after above but okay for discharge home if above improves with further adjustments of medications as outpatient  Signed, Arnoldo HookerBruce Ahamed Hofland M.D. FACC

## 2018-07-01 NOTE — Progress Notes (Signed)
Made aware by tele monitor. Patient had 3 beat run vtach. Upon entering room patient was sitting up on the side of the bed. No distress no c/o pain. Will continue to monitor

## 2018-07-01 NOTE — Progress Notes (Signed)
Patient ambulated in hallway with CNA. No distress, no Sob, gait steady. Patient on RA during ambulation. Heart rate high 50's low 60's

## 2018-07-01 NOTE — Progress Notes (Signed)
[employer] - [place] at [facility]   PATIENT NAME: Larry Lawrence    MR#:  [id number]  DATE OF BIRTH:  1940/08/16  SUBJECTIVE:  CHIEF COMPLAINT:   Chief Complaint  Patient presents with  . Leg Swelling   The patient has better shortness of breath.  But still has swelling in both arms and legs. REVIEW OF SYSTEMS:  Review of Systems  Constitutional: Positive for malaise/fatigue. Negative for chills and fever.  HENT: Negative for sore throat.   Eyes: Negative for blurred vision and double vision.  Respiratory: Negative for cough, hemoptysis, shortness of breath, wheezing and stridor.   Cardiovascular: Positive for leg swelling. Negative for chest pain, palpitations and orthopnea.  Gastrointestinal: Negative for abdominal pain, blood in stool, diarrhea, melena, nausea and vomiting.  Genitourinary: Negative for dysuria, flank pain and hematuria.  Musculoskeletal: Negative for back pain and joint pain.  Skin: Negative for rash.  Neurological: Negative for dizziness, sensory change, focal weakness, seizures, loss of consciousness, weakness and headaches.  Endo/Heme/Allergies: Negative for polydipsia.  Psychiatric/Behavioral: Negative for depression. The patient is not nervous/anxious.     DRUG ALLERGIES:   Allergies  Allergen Reactions  . Niaspan [Niacin Er] Other (See Comments)    Reaction:  Hot flashes    VITALS:  Blood pressure (!) 161/89, pulse (!) 56, temperature (!) 97.5 F (36.4 C), temperature source Oral, resp. rate 20, height 5\' 11"  (1.803 m), weight 105.1 kg, SpO2 99 %. PHYSICAL EXAMINATION:  Physical Exam Constitutional:      General: He is not in acute distress.    Appearance: Normal appearance.  HENT:     Head: Normocephalic.     Mouth/Throat:     Mouth: Mucous membranes are moist.  Eyes:     General: No scleral icterus.    Conjunctiva/sclera: Conjunctivae normal.     Pupils: Pupils are equal, round, and reactive to light.  Neck:   Musculoskeletal: Normal range of motion and neck supple.     Vascular: No JVD.     Trachea: No tracheal deviation.  Cardiovascular:     Rate and Rhythm: Normal rate and regular rhythm.     Heart sounds: Normal heart sounds. No murmur. No gallop.   Pulmonary:     Effort: Pulmonary effort is normal. No respiratory distress.     Breath sounds: Normal breath sounds. No stridor. No wheezing, rhonchi or rales.  Chest:     Chest wall: No tenderness.  Abdominal:     General: Bowel sounds are normal. There is no distension.     Palpations: Abdomen is soft.     Tenderness: There is no abdominal tenderness. There is no rebound.  Musculoskeletal: Normal range of motion.        General: Swelling present. No tenderness.     Right lower leg: Edema present.     Left lower leg: Edema present.     Comments: Edema 2+ in both arms and legs.  Skin:    Findings: No erythema or rash.  Neurological:     General: No focal deficit present.     Mental Status: He is alert and oriented to person, place, and time.     Cranial Nerves: No cranial nerve deficit.  Psychiatric:        Mood and Affect: Mood normal.    LABORATORY PANEL:  Male CBC Recent Labs  Lab 07/01/18 0323  WBC 5.1  HGB 11.2*  HCT 35.6*  PLT 189   ------------------------------------------------------------------------------------------------------------------ Chemistries  Recent  Labs  Lab 06/30/18 1345 07/01/18 0323  NA 143 142  K 3.7 3.1*  CL 111 110  CO2 24 24  GLUCOSE 120* 118*  BUN 23 22  CREATININE 1.70* 1.73*  CALCIUM 8.7* 8.2*  MG  --  2.0  AST 24  --   ALT 20  --   ALKPHOS 143*  --   BILITOT 1.4*  --    RADIOLOGY:  No results found. ASSESSMENT AND PLAN:   Patient is a 78 year old with history of chronic diastolic CHF, coronary artery disease, gout, history of TIA who is presenting with worsening shortness of breath and lower extremity swelling  1.  Acute on chronic systolic CHF. Continue IV Lasix 40 mg  every 8 hours.  Monitor BMP, input and output. Dr. Gwen PoundsKowalski, When discharged patient will have change to torsemide for diuretic.  Continue Entresto and Coreg.  2.  Coronary artery disease continue his home regimen including Coreg  3.  Hyperlipidemia continue Lipitor  4.  Hypertension, resumed his home regimen.  CKD stage III.  Stable.  Hypokalemia.  Given potassium.  Magnesium is normal.  All the records are reviewed and case discussed with Care Management/Social Worker. Management plans discussed with the patient, his daughter and they are in agreement.  CODE STATUS: DNR  TOTAL TIME TAKING CARE OF THIS PATIENT: 35 minutes.   More than 50% of the time was spent in counseling/coordination of care: YES  POSSIBLE D/C IN 2 DAYS, DEPENDING ON CLINICAL CONDITION.   Shaune PollackQing Keylin Podolsky M.D on 07/01/2018 at 3:14 PM  Between 7am to 6pm - Pager - [phone]  After 6pm go to [email or web address] - Therapist, nutritionalpassword [email or web address]  [employer] [place] [facility]

## 2018-07-02 LAB — MAGNESIUM: MAGNESIUM: 2.1 mg/dL (ref 1.7–2.4)

## 2018-07-02 LAB — BASIC METABOLIC PANEL
Anion gap: 7 (ref 5–15)
BUN: 23 mg/dL (ref 8–23)
CO2: 24 mmol/L (ref 22–32)
Calcium: 8.2 mg/dL — ABNORMAL LOW (ref 8.9–10.3)
Chloride: 108 mmol/L (ref 98–111)
Creatinine, Ser: 1.52 mg/dL — ABNORMAL HIGH (ref 0.61–1.24)
GFR calc Af Amer: 51 mL/min — ABNORMAL LOW (ref >60)
GFR calc non Af Amer: 44 mL/min — ABNORMAL LOW (ref >60)
Glucose, Bld: 103 mg/dL — ABNORMAL HIGH (ref 70–99)
POTASSIUM: 2.9 mmol/L — AB (ref 3.5–5.1)
Sodium: 139 mmol/L (ref 135–145)

## 2018-07-02 MED ORDER — POTASSIUM CHLORIDE CRYS ER 20 MEQ PO TBCR
40.0000 meq | EXTENDED_RELEASE_TABLET | Freq: Three times a day (TID) | ORAL | Status: DC
  Administered 2018-07-02 – 2018-07-03 (×4): 40 meq via ORAL
  Filled 2018-07-02 (×4): qty 2

## 2018-07-02 NOTE — Progress Notes (Signed)
[facility] Cardiology [facility] Encounter Note  Patient: Larry Lawrence / Admit Date: 06/30/2018 / Date of Encounter: 07/02/2018, 8:50 AM   Subjective: Patient is feeling much better at this time with less shortness of breath and slight improvements of anemia.  After review it does appear the patient has been dietary compliant with a low-salt diet and continues to take his medications although may not have been taking appropriate medication management with a diuretic.  Previous history suggest that he was discharged on torsemide but was taking furosemide.  No evidence of myocardial infarction with normal troponin.  Telemetry continuing to show sinus bradycardia  Review of Systems: Positive for: Shortness of breath weakness and edema improved Negative for: Vision change, hearing change, syncope, dizziness, nausea, vomiting,diarrhea, bloody stool, stomach pain, cough, congestion, diaphoresis, urinary frequency, urinary pain,skin lesions, skin rashes Others previously listed  Objective: Telemetry: Sinus bradycardia Physical Exam: Blood pressure (!) 164/75, pulse 62, temperature 97.8 F (36.6 C), temperature source Oral, resp. rate 18, height 5\' 11"  (1.803 m), weight 103 kg, SpO2 99 %. Body mass index is 31.66 kg/m. General: Well developed, well nourished, in no acute distress. Head: Normocephalic, atraumatic, sclera non-icteric, no xanthomas, nares are without discharge. Neck: No apparent masses Lungs: Normal respirations with no wheezes, no rhonchi, no rales , basilar crackles   Heart: Regular rate and rhythm, normal S1 S2, no murmur, no rub, no gallop, PMI is normal size and placement, carotid upstroke normal without bruit, jugular venous pressure normal Abdomen: Soft, non-tender, non-distended with normoactive bowel sounds. No hepatosplenomegaly. Abdominal aorta is normal size without bruit Extremities: 2+ edema, no clubbing, no cyanosis, positive ulcers,  Peripheral: 2+ radial, 2+  femoral, 1 + dorsal pedal pulses Neuro: Alert and oriented. Moves all extremities spontaneously. Psych:  Responds to questions appropriately with a normal affect.   Intake/Output Summary (Last 24 hours) at 07/02/2018 0850 Last data filed at 07/02/2018 0647 Gross per 24 hour  Intake 480 ml  Output 3150 ml  Net -2670 ml    Inpatient Medications:  . allopurinol  300 mg Oral BID  . amiodarone  200 mg Oral Daily  . atorvastatin  20 mg Oral Daily  . carvedilol  25 mg Oral BID WC  . enoxaparin (LOVENOX) injection  40 mg Subcutaneous Q24H  . fluticasone  1 spray Each Nare Daily  . furosemide  40 mg Intravenous Q8H  . hydrALAZINE  50 mg Oral Q8H  . isosorbide mononitrate  30 mg Oral Daily  . montelukast  10 mg Oral QHS  . potassium chloride SA  40 mEq Oral TID  . sacubitril-valsartan  1 tablet Oral BID  . sodium chloride flush  10 mL Intravenous Q12H  . timolol  1 drop Both Eyes BID   Infusions:  . sodium chloride      Labs: Recent Labs    07/01/18 0323 07/02/18 0402  NA 142 139  K 3.1* 2.9*  CL 110 108  CO2 24 24  GLUCOSE 118* 103*  BUN 22 23  CREATININE 1.73* 1.52*  CALCIUM 8.2* 8.2*  MG 2.0 2.1   Recent Labs    06/30/18 1345  AST 24  ALT 20  ALKPHOS 143*  BILITOT 1.4*  PROT 6.5  ALBUMIN 3.3*   Recent Labs    06/30/18 1345 07/01/18 0323  WBC 5.0 5.1  NEUTROABS 3.6  --   HGB 12.6* 11.2*  HCT 40.3 35.6*  MCV 94.2 92.7  PLT 188 189   Recent Labs  06/30/18 1345  TROPONINI <0.03   Invalid input(s): POCBNP No results for input(s): HGBA1C in the last 72 hours.   Weights: Filed Weights   06/30/18 2230 07/01/18 0510 07/02/18 0408  Weight: 105.1 kg 105.1 kg 103 kg     Radiology/Studies:  Dg Chest 2 View  Result Date: 06/30/2018 CLINICAL DATA:  Bilateral leg swelling. Worsening shortness of breath. EXAM: CHEST - 2 VIEW COMPARISON:  06/21/2018. FINDINGS: Cardiomegaly with bilateral from interstitial prominence and bilateral pleural effusions.  Findings consistent CHF. Low lung volumes with basilar atelectasis. IMPRESSION: Congestive heart failure bilateral pulmonary interstitial edema and bilateral pleural effusions. Low lung volumes with bibasilar atelectasis. Electronically Signed   By: Maisie Fushomas  Register   On: 06/30/2018 10:25   Ct Head Wo Contrast  Result Date: 06/21/2018 CLINICAL DATA:  Altered mental status, fall tonight. EXAM: CT HEAD WITHOUT CONTRAST TECHNIQUE: Contiguous axial images were obtained from the base of the skull through the vertex without intravenous contrast. COMPARISON:  CT HEAD April 23, 2017 FINDINGS: BRAIN: No intraparenchymal hemorrhage, mass effect nor midline shift. Moderate parenchymal brain volume loss, no hydrocephalus. Confluent supratentorial white matter hypodensities. 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 Beam hardening artifact through lower pons. No acute large vascular territory infarcts. No abnormal extra-axial fluid collections. Basal cisterns are patent. VASCULAR: Moderate calcific atherosclerosis of the carotid siphons. SKULL: No skull fracture. No significant scalp soft tissue swelling. SINUSES/ORBITS: Chronic bilateral mastoiditis relatively stable. Trace paranasal sinus mucosal thickening. Dehiscent RIGHT jugular bulb. The included ocular globes and orbital contents are non-suspicious. Status post bilateral ocular lens implants. OTHER: None. IMPRESSION: 1. No acute intracranial process. 2. 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 Electronically Signed   By: Awilda Metroourtnay  Bloomer M.D.   On: 06/21/2018 01:59   Mr Brain Wo Contrast  Result Date: 06/21/2018 CLINICAL DATA:  Altered mental status, fell tonight. EXAM: MRI HEAD WITHOUT CONTRAST TECHNIQUE: Multiplanar, multiecho pulse sequences of the brain and surrounding structures were obtained without intravenous contrast. COMPARISON:  CT HEAD June 21, 2018 FINDINGS:  Mild motion degraded examination. INTRACRANIAL CONTENTS: No reduced diffusion to suggest acute ischemia. 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 Old bilateral basal ganglia infarcts. Prominent basal ganglia perivascular spaces associated with chronic small vessel ischemic changes. Confluent supratentorial white matter FLAIR T2 hyperintensities. Moderate parenchymal brain volume loss. No hydrocephalus. No suspicious parenchymal signal, masses, mass effect. No abnormal extra-axial fluid collections. No extra-axial masses. VASCULAR: Normal major intracranial vascular flow voids present at skull base. SKULL AND UPPER CERVICAL SPINE: No abnormal sellar expansion. No suspicious calvarial bone marrow signal. Craniocervical junction maintained. SINUSES/ORBITS: Trace LEFT mastoid effusion. The included ocular globes and orbital contents are non-suspicious. Status post bilateral ocular lens implants. OTHER: None. IMPRESSION: 1. No acute intracranial process. 2. Moderate to severe chronic small vessel ischemic changes. 3. 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 Electronically Signed   By: Awilda Metroourtnay  Bloomer M.D.   On: 06/21/2018 05:16   Dg Chest Portable 1 View  Result Date: 06/21/2018 CLINICAL DATA:  Pain and swelling after fall. EXAM: PORTABLE CHEST 1 VIEW COMPARISON:  Chest radiograph May 20, 2018 FINDINGS: Stable cardiomegaly. Pulmonary vascular congestion mild interstitial prominence with increased patchy bibasilar airspace opacities and small pleural effusions. Calcified aortic arch. No pneumothorax. Osteopenia. IMPRESSION: Stable cardiomegaly. Interstitial edema confluent in lung bases versus atelectasis. Small pleural effusions. Aortic Atherosclerosis (ICD10-I70.0). Electronically Signed   By: Awilda Metroourtnay  Bloomer M.D.   On: 06/21/2018 01:53  Dg Knee Complete 4 Views Left  Result Date: 06/21/2018 CLINICAL DATA:  Pain and swelling after fall, assess for  fracture. EXAM: LEFT KNEE - COMPLETE 4+ VIEW COMPARISON:  None. FINDINGS: No acute fracture deformity or dislocation. Severe lateral, moderate patellofemoral medial compartment narrowing with periarticular sclerosis and marginal spurring. Osteopenia. No destructive bony lesions. Moderate vascular calcifications. IMPRESSION: 1. No acute fracture deformity or dislocation. 2. Tricompartmental osteoarthrosis, severe within lateral compartment. Electronically Signed   By: Awilda Metro M.D.   On: 06/21/2018 01:52     Assessment and Recommendation  78 y.o. male with acute on chronic systolic dysfunction congestive heart failure with bradycardia pulmonary edema chronic kidney disease and no current evidence of myocardial infarction more improved from admission 1.  Change furosemide to torsemide for lower extremity edema pulmonary edema and acute on chronic systolic dysfunction heart failure 2.  When discharged patient will have change to torsemide for diuretic further adjustments to diuresis due to patient apparently being relatively compliant with low-salt diet as stated above 3.  Reinstatement of medication management for cardiomyopathy including Entresto and carvedilol as able watching closely for any further significant side effects 4.  No further cardiac diagnostics necessary at this time due to no evidence of myocardial infarction and continued non-ledge cardiogram showing severe LV systolic dysfunction with ejection fraction of 25% 5.  Further treatment options after above but okay for discharge home if above improves with further adjustments of medications as outpatient 6.  Call if further questions  Signed, Arnoldo Hooker M.D. FACC

## 2018-07-02 NOTE — Progress Notes (Signed)
[employer] - [place] at [facility]   PATIENT NAME: Larry Lawrence    MR#:  [id number]  DATE OF BIRTH:  December 04, 1940  SUBJECTIVE:  CHIEF COMPLAINT:   Chief Complaint  Patient presents with  . Leg Swelling   The patient has better shortness of breath, better swelling in both arms and legs. REVIEW OF SYSTEMS:  Review of Systems  Constitutional: Negative for chills, fever and malaise/fatigue.  HENT: Negative for sore throat.   Eyes: Negative for blurred vision and double vision.  Respiratory: Negative for cough, hemoptysis, shortness of breath, wheezing and stridor.   Cardiovascular: Positive for leg swelling. Negative for chest pain, palpitations and orthopnea.  Gastrointestinal: Negative for abdominal pain, blood in stool, diarrhea, melena, nausea and vomiting.  Genitourinary: Negative for dysuria, flank pain and hematuria.  Musculoskeletal: Negative for back pain and joint pain.  Skin: Negative for rash.  Neurological: Negative for dizziness, sensory change, focal weakness, seizures, loss of consciousness, weakness and headaches.  Endo/Heme/Allergies: Negative for polydipsia.  Psychiatric/Behavioral: Negative for depression. The patient is not nervous/anxious.     DRUG ALLERGIES:   Allergies  Allergen Reactions  . Niaspan [Niacin Er] Other (See Comments)    Reaction:  Hot flashes    VITALS:  Blood pressure (!) 142/79, pulse (!) 50, temperature 97.8 F (36.6 C), temperature source Oral, resp. rate 18, height 5\' 11"  (1.803 m), weight 103 kg, SpO2 99 %. PHYSICAL EXAMINATION:  Physical Exam Constitutional:      General: He is not in acute distress.    Appearance: Normal appearance.  HENT:     Head: Normocephalic.     Mouth/Throat:     Mouth: Mucous membranes are moist.  Eyes:     General: No scleral icterus.    Conjunctiva/sclera: Conjunctivae normal.     Pupils: Pupils are equal, round, and reactive to light.  Neck:     Musculoskeletal: Normal range  of motion and neck supple.     Vascular: No JVD.     Trachea: No tracheal deviation.  Cardiovascular:     Rate and Rhythm: Normal rate and regular rhythm.     Heart sounds: Normal heart sounds. No murmur. No gallop.   Pulmonary:     Effort: Pulmonary effort is normal. No respiratory distress.     Breath sounds: Normal breath sounds. No stridor. No wheezing, rhonchi or rales.  Chest:     Chest wall: No tenderness.  Abdominal:     General: Bowel sounds are normal. There is no distension.     Palpations: Abdomen is soft.     Tenderness: There is no abdominal tenderness. There is no rebound.  Musculoskeletal: Normal range of motion.        General: Swelling present. No tenderness.     Right lower leg: Edema present.     Left lower leg: Edema present.     Comments: Edema 2+ in both arms and legs.  Skin:    Findings: No erythema or rash.  Neurological:     General: No focal deficit present.     Mental Status: He is alert and oriented to person, place, and time.     Cranial Nerves: No cranial nerve deficit.  Psychiatric:        Mood and Affect: Mood normal.    LABORATORY PANEL:  Male CBC Recent Labs  Lab 07/01/18 0323  WBC 5.1  HGB 11.2*  HCT 35.6*  PLT 189   ------------------------------------------------------------------------------------------------------------------ Chemistries  Recent Labs  Lab  06/30/18 1345  07/02/18 0402  NA 143   < > 139  K 3.7   < > 2.9*  CL 111   < > 108  CO2 24   < > 24  GLUCOSE 120*   < > 103*  BUN 23   < > 23  CREATININE 1.70*   < > 1.52*  CALCIUM 8.7*   < > 8.2*  MG  --    < > 2.1  AST 24  --   --   ALT 20  --   --   ALKPHOS 143*  --   --   BILITOT 1.4*  --   --    < > = values in this interval not displayed.   RADIOLOGY:  No results found. ASSESSMENT AND PLAN:   Patient is a 78 year old with history of chronic diastolic CHF, coronary artery disease, gout, history of TIA who is presenting with worsening shortness of breath  and lower extremity swelling  1.  Acute on chronic systolic CHF. Continue IV Lasix 40 mg every 8 hours.  Monitor BMP, input and output. Dr. Gwen Pounds, When discharged patient will have change to torsemide for diuretic.  Continue Entresto and Coreg.  2.  Coronary artery disease continue his home regimen including Coreg  3.  Hyperlipidemia continue Lipitor  4.  Hypertension, resumed his home regimen.  CKD stage III.  Stable.  Hypokalemia.  K 2.9.  Given potassium.  Magnesium is normal.  All the records are reviewed and case discussed with Care Management/Social Worker. Management plans discussed with the patient, his daughter and they are in agreement.  CODE STATUS: DNR  TOTAL TIME TAKING CARE OF THIS PATIENT: 28 minutes.   More than 50% of the time was spent in counseling/coordination of care: YES  POSSIBLE D/C IN 1-2 DAYS, DEPENDING ON CLINICAL CONDITION.   Shaune Pollack M.D on 07/02/2018 at 4:26 PM  Between 7am to 6pm - Pager - [phone]  After 6pm go to [email or web address] - Therapist, nutritional Hospitalists

## 2018-07-03 LAB — BASIC METABOLIC PANEL
Anion gap: 7 (ref 5–15)
BUN: 21 mg/dL (ref 8–23)
CO2: 28 mmol/L (ref 22–32)
Calcium: 8.5 mg/dL — ABNORMAL LOW (ref 8.9–10.3)
Chloride: 106 mmol/L (ref 98–111)
Creatinine, Ser: 1.61 mg/dL — ABNORMAL HIGH (ref 0.61–1.24)
GFR calc Af Amer: 48 mL/min — ABNORMAL LOW (ref >60)
GFR calc non Af Amer: 41 mL/min — ABNORMAL LOW (ref >60)
Glucose, Bld: 99 mg/dL (ref 70–99)
Potassium: 3.4 mmol/L — ABNORMAL LOW (ref 3.5–5.1)
Sodium: 141 mmol/L (ref 135–145)

## 2018-07-03 LAB — MAGNESIUM: Magnesium: 2.1 mg/dL (ref 1.7–2.4)

## 2018-07-03 MED ORDER — TORSEMIDE 20 MG PO TABS
40.0000 mg | ORAL_TABLET | Freq: Every day | ORAL | Status: DC
  Administered 2018-07-03: 40 mg via ORAL
  Filled 2018-07-03: qty 2

## 2018-07-03 MED ORDER — TORSEMIDE 20 MG PO TABS: 40.0000 mg | ORAL_TABLET | Freq: Every day | ORAL | 1 refills | Status: DC

## 2018-07-03 NOTE — Progress Notes (Signed)
Encourage pt to watch Emi Video on Heart Failure however pt refused.

## 2018-07-03 NOTE — Discharge Instructions (Signed)
HHPT °

## 2018-07-03 NOTE — Care Management Note (Signed)
Case Management Note  Patient Details  Name: Larry Lawrence MRN: [id number] Date of Birth: [birth year]  Subjective/Objective:      Patient is from home with grandson.  Admitted with acute on chronic CHF.  He is current with his PCP.  Denies difficulties obtaining medications or with accessing medical care.  Has a scale at home but does not weigh. Larry Lawrence states he will be sure he weighs.  Heart failure booklet at bedside.   Has a heart failure clinic appointment.  Offered home health services but they have declined at this time.  Explained they can contact their PCP if they change their mind.  No further needs at this time.              Action/Plan:   Expected Discharge Date:  07/03/18               Expected Discharge Plan:  Home/Self Care  In-House Referral:     Discharge planning Services  CM Consult  Post Acute Care Choice:    Choice offered to:     DME Arranged:    DME Agency:     HH Arranged:  Patient Refused HH HH Agency:     Status of Service:  Completed, signed off  If discussed at [employer] of Stay Meetings, dates discussed:    Additional Comments:  Larry Kerns, RN 07/03/2018, 11:58 AM

## 2018-07-03 NOTE — Discharge Summary (Signed)
[facility] [employer] -  at [facility]   PATIENT NAME: Larry Lawrence    MR#:  [id number]  DATE OF BIRTH:  1941/05/08  DATE OF ADMISSION:  06/30/2018   ADMITTING PHYSICIAN: Auburn Bilberry, MD  DATE OF DISCHARGE: 07/03/2018  PRIMARY CARE PHYSICIAN: Duanne Limerick, MD   ADMISSION DIAGNOSIS:  Bradycardia [R00.1] Elevated troponin [R79.89] Chronic renal failure, unspecified CKD stage [N18.9] Acute on chronic congestive heart failure, unspecified heart failure type (HCC) [I50.9] DISCHARGE DIAGNOSIS:  Active Problems:   Acute CHF (congestive heart failure) (HCC)  SECONDARY DIAGNOSIS:   Past Medical History:  Diagnosis Date  . Allergy   . CHF (congestive heart failure) (HCC)   . Coronary artery disease   . Gout   . Insomnia   . Renal disorder   . Sleeps in sitting position due to orthopnea   . TIA (transient ischemic attack)    HOSPITAL COURSE:  Patient is a 78 year old with history of chronic diastolic CHF, coronary artery disease, gout, history of TIA who is presenting with worsening shortness of breath and lower extremity swelling  1.Acute on chronic systolic CHF. Continue IV Lasix 40 mg every 8 hours.  Monitor BMP, input and output. Dr. Gwen Pounds, When discharged patient will have change to torsemide for diuretic.  Continue Entresto and Coreg. Changed to torsemide 40 mg po daily.  2.Coronary artery disease continue his home regimen including Coreg  3.Hyperlipidemia continue Lipitor  4.Hypertension, resumed his home regimen.  CKD stage III.  Stable.  Hypokalemia.  improving, continue home potassium.  Magnesium is normal. DISCHARGE CONDITIONS:  Stable, discharge to home with home health and PT today. CONSULTS OBTAINED:  Treatment Team:  Lamar Blinks, MD DRUG ALLERGIES:   Allergies  Allergen Reactions  . Niaspan [Niacin Er] Other (See Comments)    Reaction:  Hot flashes    DISCHARGE MEDICATIONS:   Allergies as of 07/03/2018       Reactions   Niaspan [niacin Er] Other (See Comments)   Reaction:  Hot flashes       Medication List    STOP taking these medications   furosemide 40 MG tablet Commonly known as:  LASIX     TAKE these medications   allopurinol 300 MG tablet Commonly known as:  ZYLOPRIM TAKE (1) TABLET BY MOUTH TWICE DAILY What changed:  See the new instructions.   amiodarone 200 MG tablet Commonly known as:  PACERONE Take 200 mg by mouth daily.   atorvastatin 20 MG tablet Commonly known as:  LIPITOR Take 20 mg by mouth daily.   carvedilol 25 MG tablet Commonly known as:  COREG Take 25 mg by mouth 2 (two) times daily with a meal.   ENTRESTO 24-26 MG Generic drug:  sacubitril-valsartan Take 1 tablet by mouth 2 (two) times daily.   hydrALAZINE 25 MG tablet Commonly known as:  APRESOLINE Take 1 tablet (25 mg total) by mouth every 8 (eight) hours. What changed:  how much to take   isosorbide mononitrate 30 MG 24 hr tablet Commonly known as:  IMDUR Take 30 mg by mouth daily.   loratadine 10 MG tablet Commonly known as:  CLARITIN Take 1 tablet (10 mg total) by mouth daily. What changed:    when to take this  reasons to take this   mometasone 50 MCG/ACT nasal spray Commonly known as:  NASONEX Place 2 sprays into the nose daily.   montelukast 10 MG tablet Commonly known as:  SINGULAIR TAKE ONE TABLET AT BEDTIME.  potassium chloride SA 20 MEQ tablet Commonly known as:  K-DUR,KLOR-CON Take 1 tablet (20 mEq total) by mouth 3 (three) times daily.   timolol 0.5 % ophthalmic solution Commonly known as:  TIMOPTIC Place 1 drop into both eyes 2 (two) times daily. Dr Inez PilgrimBrasington   torsemide 20 MG tablet Commonly known as:  DEMADEX Take 2 tablets (40 mg total) by mouth daily.        DISCHARGE INSTRUCTIONS:  See AVS. If you experience worsening of your admission symptoms, develop shortness of breath, life threatening emergency, suicidal or homicidal thoughts you must seek  medical attention immediately by calling 911 or calling your MD immediately  if symptoms less severe.  You Must read complete instructions/literature along with all the possible adverse reactions/side effects for all the Medicines you take and that have been prescribed to you. Take any new Medicines after you have completely understood and accpet all the possible adverse reactions/side effects.   Please note  You were cared for by a hospitalist during your hospital stay. If you have any questions about your discharge medications or the care you received while you were in the hospital after you are discharged, you can call the unit and asked to speak with the hospitalist on call if the hospitalist that took care of you is not available. Once you are discharged, your primary care physician will handle any further medical issues. Please note that NO REFILLS for any discharge medications will be authorized once you are discharged, as it is imperative that you return to your primary care physician (or establish a relationship with a primary care physician if you do not have one) for your aftercare needs so that they can reassess your need for medications and monitor your lab values.    On the day of Discharge:  VITAL SIGNS:  Blood pressure (!) 143/65, pulse 66, temperature 98 F (36.7 C), temperature source Oral, resp. rate 18, height 5\' 11"  (1.803 m), weight 103 kg, SpO2 97 %. PHYSICAL EXAMINATION:  GENERAL:  78 y.o.-year-old patient lying in the bed with no acute distress.  EYES: Pupils equal, round, reactive to light and accommodation. No scleral icterus. Extraocular muscles intact.  HEENT: Head atraumatic, normocephalic. Oropharynx and nasopharynx clear.  NECK:  Supple, no jugular venous distention. No thyroid enlargement, no tenderness.  LUNGS: Normal breath sounds bilaterally, no wheezing, rales,rhonchi or crepitation. No use of accessory muscles of respiration.  CARDIOVASCULAR: S1, S2 normal.  No murmurs, rubs, or gallops.  ABDOMEN: Soft, non-tender, non-distended. Bowel sounds present. No organomegaly or mass.  EXTREMITIES: No cyanosis, or clubbing. Bilateral leg edema. NEUROLOGIC: Cranial nerves II through XII are intact. Muscle strength 4/5 in all extremities. Sensation intact. Gait not checked.  PSYCHIATRIC: The patient is alert and oriented x 3.  SKIN: No obvious rash, lesion, or ulcer.  DATA REVIEW:   CBC Recent Labs  Lab 07/01/18 0323  WBC 5.1  HGB 11.2*  HCT 35.6*  PLT 189    Chemistries  Recent Labs  Lab 06/30/18 1345  07/03/18 0848  NA 143   < > 141  K 3.7   < > 3.4*  CL 111   < > 106  CO2 24   < > 28  GLUCOSE 120*   < > 99  BUN 23   < > 21  CREATININE 1.70*   < > 1.61*  CALCIUM 8.7*   < > 8.5*  MG  --    < > 2.1  AST 24  --   --  ALT 20  --   --   ALKPHOS 143*  --   --   BILITOT 1.4*  --   --    < > = values in this interval not displayed.     Microbiology Results  No results found for this or any previous visit.  RADIOLOGY:  No results found.   Management plans discussed with the patient, his grandson and they are in agreement.  CODE STATUS: DNR   TOTAL TIME TAKING CARE OF THIS PATIENT: 32 minutes.    Shaune PollackQing Shariyah Eland M.D on 07/03/2018 at 12:00 PM  Between 7am to 6pm - Pager - [phone]  After 6pm go to [email or web address] - Social research officer, governmentpassword [email or web address] [facility]  [facility]  Office  [phone]  CC: Primary care physician; Duanne LimerickJones, Deanna C, MD   Note: This dictation was prepared with Dragon dictation along with smaller phrase technology. Any transcriptional errors that result from this process are unintentional.

## 2018-07-03 NOTE — Care Management Important Message (Signed)
Copy of signed Medicare IM left with patient in room. 

## 2018-07-03 NOTE — Progress Notes (Signed)
Larry Lawrence to be D/C'd Home per MD order.  Discussed prescriptions and follow up appointments with the patient. Prescriptions given to patient, medication list explained in detail. Pt verbalized understanding.Larry MaineGrandson will be transporting pt home.  Allergies as of 07/03/2018      Reactions   Niaspan [niacin Er] Other (See Comments)   Reaction:  Hot flashes       Medication List    STOP taking these medications   furosemide 40 MG tablet Commonly known as:  LASIX     TAKE these medications   allopurinol 300 MG tablet Commonly known as:  ZYLOPRIM TAKE (1) TABLET BY MOUTH TWICE DAILY What changed:  See the new instructions.   amiodarone 200 MG tablet Commonly known as:  PACERONE Take 200 mg by mouth daily.   atorvastatin 20 MG tablet Commonly known as:  LIPITOR Take 20 mg by mouth daily.   carvedilol 25 MG tablet Commonly known as:  COREG Take 25 mg by mouth 2 (two) times daily with a meal.   ENTRESTO 24-26 MG Generic drug:  sacubitril-valsartan Take 1 tablet by mouth 2 (two) times daily.   hydrALAZINE 25 MG tablet Commonly known as:  APRESOLINE Take 1 tablet (25 mg total) by mouth every 8 (eight) hours. What changed:  how much to take   isosorbide mononitrate 30 MG 24 hr tablet Commonly known as:  IMDUR Take 30 mg by mouth daily.   loratadine 10 MG tablet Commonly known as:  CLARITIN Take 1 tablet (10 mg total) by mouth daily. What changed:    when to take this  reasons to take this   mometasone 50 MCG/ACT nasal spray Commonly known as:  NASONEX Place 2 sprays into the nose daily.   montelukast 10 MG tablet Commonly known as:  SINGULAIR TAKE ONE TABLET AT BEDTIME.   potassium chloride SA 20 MEQ tablet Commonly known as:  K-DUR,KLOR-CON Take 1 tablet (20 mEq total) by mouth 3 (three) times daily.   timolol 0.5 % ophthalmic solution Commonly known as:  TIMOPTIC Place 1 drop into both eyes 2 (two) times daily. Dr Inez PilgrimBrasington   torsemide 20 MG  tablet Commonly known as:  DEMADEX Take 2 tablets (40 mg total) by mouth daily.       Vitals:   07/03/18 0807 07/03/18 1000  BP: (!) 143/65   Pulse: (!) 57 66  Resp: 18   Temp: 98 F (36.7 C)   SpO2: 97%     Tele box removed and returned. Skin clean, dry and intact without evidence of skin break down, no evidence of skin tears noted. IV catheter discontinued intact. Site without signs and symptoms of complications. Dressing and pressure applied. Pt denies pain at this time. No complaints noted.  An After Visit Summary was printed and given to the patient. Patient escorted via WC, and D/C home via private auto.  Larry NoelErica Y Oluwanifemi Petitti

## 2018-07-03 NOTE — Progress Notes (Signed)
Cardiovascular and Pulmonary Nurse Navigator Note:    78 year old with hx of chronic diastolic CHF, CAD, CKD, gout, and hx of TIA who presented to the ED with worsening SOB and lower extremity swelling.  Patient is for possible discharge today.    This RN rounded on patient.  Patient sitting up in the bed with HOB elevated.  Patient's lower extremities remain edematous.  Grandson at bedside and stated, "He always has swelling in his legs."  Patient lives with son and granddaughter-in-law.     Educational session with patient and grandson completed.  Provided patient with "Living Better with Heart Failure" packet. Briefly reviewed definition of heart failure and signs and symptoms of an exacerbation.?Explained to patient that HF is a chronic illness which requires self-assessment / self-management along with help from the cardiologist/PCP.?? *Reviewed importance of and reason behind checking weight daily in the AM, after using the bathroom, but before getting dressed. Patient has functioning scales at home, but has not been weighing himself.  ? *Reviewed with patient the following information: *Discussed when to call the Dr= weight gain of >2-3lb overnight of 5lb in a week,  *Discussed yellow zone= call MD: weight gain of >2-3lb overnight of 5lb in a week, increased swelling, increased SOB when lying down, chest discomfort, dizziness, increased fatigue *Red Zone= call 911: struggle to breath, fainting or near fainting, significant chest pain   *Diet - Reviewed low sodium diet-provided handout of recommended and not recommended foods.? ? *Discussed fluid intake with patient as well. Patient not currently on a fluid restriction, but advised no more than 64 ounces of fluid per day - all fluid.   ? *Instructed patient to take medications as prescribed for heart failure. Explained briefly why pt is on the medications (either make you feel better, live longer or keep you out of the hospital) and  discussed monitoring and side effects.  ? *Discussed exercise. Patient ambulates using a cane or walker at home.  HH PT and RN has been ordered for patient upon discharge.  RNCM to discuss with patient.   ? *Smoking Cessation- Patient is a former smoker.? ? *[facility] - Explained the purpose of the HF Clinic. ?Explained to patient the HF Clinic does not replace PCP nor Cardiologist, but is an additional resource to helping patient manage heart failure at home. Appointment in the [facility] on 07/21/2018 at 11:00 a.m.   ?? Again, the 5 Steps to Living Better with Heart Failure were reviewed with patient.  ? Patient thanked me for providing the above information. ? ? Army Melia, RN, BSN, [facility]? [facility]  [facility] Cardiac &?Pulmonary Rehab  Cardiovascular &?Pulmonary Nurse Navigator  Direct Line: [phone]  Department Phone #: [phone] Fax: [phone]? Email Address: [email or web address]

## 2018-07-06 ENCOUNTER — Other Ambulatory Visit: Payer: Self-pay | Admitting: *Deleted

## 2018-07-06 ENCOUNTER — Encounter: Payer: Self-pay | Admitting: *Deleted

## 2018-07-06 ENCOUNTER — Telehealth: Payer: Self-pay

## 2018-07-06 NOTE — Telephone Encounter (Signed)
Attempted to reach patient to complete TCM call prior to hospital follow up appt.   Unable to leave message due to voicemail not set up.

## 2018-07-06 NOTE — Patient Outreach (Signed)
[facility] Customer service manager [facility]) Care Management [facility] CM Telephone Outreach PCP office completes Transition of Care follow up post-hospital discharge- confirmed with office staff at 3:45 pm Post-hospital discharge day # 3 Unsuccessful outreach attempt # 1  07/06/2018  Larry Lawrence 1940/10/14 [id number]  Unsuccessful telephone outreach attempt to Larry Lawrence, 78 y/o male referred to [facility] Community CM by inpatient RN CM after recent hospitalization December 31- July 03, 2018 for acute on chronic CHF exacerbation.  Patient was discharged home to self-care after refusing home health services post-hospital discharge.  Patient has history including, but not limited to, CKD; CHF; and bradycardia.  Attempted call to patient x 2 this afternoon; upon initial attempt, call was answered by male person, but call was abruptly ended; appeared that person answering phone hung up; however, I was unsure if call might have been dropped, so I re-attempted call back immediately.  With second outreach attempt, received automated outgoing voice message stating that patient's voice mail box has not yet been set up.  Unable to leave patient HIPAA compliant message requesting call back.  Plan:  Will place [facility] Community CM unsuccessful patient outreach letter in mail requesting call back in writing  Will re-attempt [facility] Community CM telephone outreach later this week if I do not hear back from patient first.  Caryl Pina, RN, BSN, CCRN [employer] Coordinator [facility] Care Management  [phone]

## 2018-07-07 NOTE — Telephone Encounter (Signed)
Second attempt made to reach pt for TCM. Voicemail not set up on mobile phone and home number listed is to the office of his apartment complex.

## 2018-07-08 ENCOUNTER — Encounter: Payer: Self-pay | Admitting: *Deleted

## 2018-07-08 ENCOUNTER — Other Ambulatory Visit: Payer: Self-pay | Admitting: *Deleted

## 2018-07-08 NOTE — Patient Outreach (Signed)
[facility] Customer service manager [facility]) Care Management [facility] CM Telephone Outreach PCP office completes Transition of Care follow up post-hospital discharge Post-hospital discharge day # 5  07/08/2018  Larry Lawrence 1940/07/24 [id number]  Unsuccessful telephone outreach attempt to [name], 78 y/o male referred to [facility] Community CM by inpatient RN CM after recent hospitalization December 31- July 03, 2018 for acute on chronic CHF exacerbation.  Patient was discharged home to self-care after refusing home health services post-hospital discharge.  Patient has history including, but not limited to, CKD; CHF; and bradycardia.  Attempted call to patient again this afternoon; received automated outgoing voice message stating that patient's voice mail box has not yet been set up.  Unable to leave patient HIPAA compliant message requesting call back.  Plan:  Verified [facility] Community CM unsuccessful patient outreach letter placed in mail requesting call back in writing on January 6,2020  Will re-attempt [facility] Community CM telephone outreach within 4 business days if I do not hear back from patient first.  Caryl Pina, RN, BSN, CCRN [employer] Coordinator [facility] Care Management  [phone]

## 2018-07-09 ENCOUNTER — Inpatient Hospital Stay: Payer: Medicare Other | Admitting: Family Medicine

## 2018-07-14 ENCOUNTER — Other Ambulatory Visit: Payer: Self-pay | Admitting: *Deleted

## 2018-07-14 ENCOUNTER — Encounter: Payer: Self-pay | Admitting: *Deleted

## 2018-07-14 NOTE — Patient Outreach (Signed)
[facility] Customer service manager [facility]) Care Management [facility] CM Telephone Outreach PCP office completes Transition of Care outreach post-hospital discharge Post-hospital discharge day # 11 Unsuccessful outreach attempt # 3 without patient call back 07/14/2018  Larry Lawrence 06/13/1941 [id number]  Unsuccessful telephone outreach attempt to [name], 78 y/o male referred to [facility] Community CM by inpatient RN CM after recent hospitalization December 31- July 03, 2018 for acute on chronic CHF exacerbation. Patient was discharged home to self-care after refusing home health services post-hospital discharge. Patient has history including, but not limited to, CKD; CHF; and bradycardia.  Attempted call to patient again this afternoon; received automated outgoing voice message stating that "the wireless customer you have called is not available;" I was again unable to leave patient HIPAA compliant message requesting call back.  Plan:  Verified [facility] Community CM unsuccessful patient outreach letter placed in mail requesting call back in writing on January 6,2020  Will move to [facility] Community CM case closure if I do not hear back from patient by the end of this week  Caryl Pina, RN, BSN, [employer] Coordinator [facility] Care Management  [phone]

## 2018-07-17 ENCOUNTER — Other Ambulatory Visit: Payer: Self-pay | Admitting: *Deleted

## 2018-07-17 ENCOUNTER — Other Ambulatory Visit: Payer: Self-pay | Admitting: Family Medicine

## 2018-07-17 DIAGNOSIS — R600 Localized edema: Secondary | ICD-10-CM | POA: Diagnosis not present

## 2018-07-17 DIAGNOSIS — M109 Gout, unspecified: Secondary | ICD-10-CM

## 2018-07-17 DIAGNOSIS — I2510 Atherosclerotic heart disease of native coronary artery without angina pectoris: Secondary | ICD-10-CM | POA: Diagnosis not present

## 2018-07-17 DIAGNOSIS — N183 Chronic kidney disease, stage 3 (moderate): Secondary | ICD-10-CM | POA: Diagnosis not present

## 2018-07-17 DIAGNOSIS — R001 Bradycardia, unspecified: Secondary | ICD-10-CM | POA: Diagnosis not present

## 2018-07-17 DIAGNOSIS — I5023 Acute on chronic systolic (congestive) heart failure: Secondary | ICD-10-CM | POA: Diagnosis not present

## 2018-07-17 NOTE — Patient Outreach (Signed)
[facility] Customer service manager [facility] Care Management [facility] Community CM Telephone Outreach, Case Closure note PCP office completes Transition of Care outreach post-hospital discharge Post-hospital discharge day #14  07/17/2018  Larry Lawrence 06/02/1941 [id number]  [facility] Community CM Case Closure note re:  Larry Lawrence, 78 y/o male referred to [facility] CM by inpatient RN CM after recent hospitalization December 31- July 03, 2018 for acute on chronic CHF exacerbation. Patient was discharged home to self-care after refusing home health services post-hospital discharge. Patient has history including, but not limited to, CKD; CHF; and bradycardia.  Patient has thus far not returned previous calls placed to him, nor responded to written outreach request for return call after 10 business days of initial outreach attempt.  Plan:  [facility] Community CM unsuccessful patient outreach letter placedin mail requesting call back in writingon January 6,2020  Will close [facility] Community CM case and make patient inactive with [facility] Community CM, as he has not responded to multiple previous outreach attempts, and will make patient's PCP aware of same.  Caryl Pina, RN, BSN, [employer] [facility] Care Management  [phone]

## 2018-07-19 ENCOUNTER — Emergency Department: Payer: Medicare Other

## 2018-07-19 ENCOUNTER — Inpatient Hospital Stay
Admission: EM | Admit: 2018-07-19 | Discharge: 2018-07-22 | DRG: 291 | Disposition: A | Discharge status: Home / Self Care | Payer: Medicare Other | Attending: Internal Medicine | Admitting: Internal Medicine

## 2018-07-19 ENCOUNTER — Other Ambulatory Visit: Payer: Self-pay

## 2018-07-19 DIAGNOSIS — M109 Gout, unspecified: Secondary | ICD-10-CM

## 2018-07-19 DIAGNOSIS — Z8042 Family history of malignant neoplasm of prostate: Secondary | ICD-10-CM | POA: Diagnosis not present

## 2018-07-19 DIAGNOSIS — E876 Hypokalemia: Secondary | ICD-10-CM | POA: Diagnosis not present

## 2018-07-19 DIAGNOSIS — N183 Chronic kidney disease, stage 3 unspecified: Secondary | ICD-10-CM

## 2018-07-19 DIAGNOSIS — J309 Allergic rhinitis, unspecified: Secondary | ICD-10-CM | POA: Diagnosis present

## 2018-07-19 DIAGNOSIS — T501X5A Adverse effect of loop [high-ceiling] diuretics, initial encounter: Secondary | ICD-10-CM | POA: Diagnosis not present

## 2018-07-19 DIAGNOSIS — E782 Mixed hyperlipidemia: Secondary | ICD-10-CM | POA: Diagnosis present

## 2018-07-19 DIAGNOSIS — Z8673 Personal history of transient ischemic attack (TIA), and cerebral infarction without residual deficits: Secondary | ICD-10-CM

## 2018-07-19 DIAGNOSIS — I130 Hypertensive heart and chronic kidney disease with heart failure and stage 1 through stage 4 chronic kidney disease, or unspecified chronic kidney disease: Principal | ICD-10-CM | POA: Diagnosis present

## 2018-07-19 DIAGNOSIS — Z66 Do not resuscitate: Secondary | ICD-10-CM | POA: Diagnosis not present

## 2018-07-19 DIAGNOSIS — N179 Acute kidney failure, unspecified: Secondary | ICD-10-CM | POA: Diagnosis not present

## 2018-07-19 DIAGNOSIS — I5021 Acute systolic (congestive) heart failure: Secondary | ICD-10-CM | POA: Diagnosis not present

## 2018-07-19 DIAGNOSIS — Z79899 Other long term (current) drug therapy: Secondary | ICD-10-CM | POA: Diagnosis not present

## 2018-07-19 DIAGNOSIS — Z955 Presence of coronary angioplasty implant and graft: Secondary | ICD-10-CM

## 2018-07-19 DIAGNOSIS — I5023 Acute on chronic systolic (congestive) heart failure: Secondary | ICD-10-CM | POA: Diagnosis present

## 2018-07-19 DIAGNOSIS — Z800 Family history of malignant neoplasm of digestive organs: Secondary | ICD-10-CM | POA: Diagnosis not present

## 2018-07-19 DIAGNOSIS — I2510 Atherosclerotic heart disease of native coronary artery without angina pectoris: Secondary | ICD-10-CM | POA: Diagnosis not present

## 2018-07-19 DIAGNOSIS — I10 Essential (primary) hypertension: Secondary | ICD-10-CM | POA: Diagnosis not present

## 2018-07-19 DIAGNOSIS — J90 Pleural effusion, not elsewhere classified: Secondary | ICD-10-CM | POA: Diagnosis not present

## 2018-07-19 DIAGNOSIS — Z87891 Personal history of nicotine dependence: Secondary | ICD-10-CM | POA: Diagnosis not present

## 2018-07-19 DIAGNOSIS — I129 Hypertensive chronic kidney disease with stage 1 through stage 4 chronic kidney disease, or unspecified chronic kidney disease: Secondary | ICD-10-CM | POA: Diagnosis not present

## 2018-07-19 DIAGNOSIS — R809 Proteinuria, unspecified: Secondary | ICD-10-CM | POA: Diagnosis not present

## 2018-07-19 DIAGNOSIS — Y92230 Patient room in hospital as the place of occurrence of the external cause: Secondary | ICD-10-CM | POA: Diagnosis not present

## 2018-07-19 DIAGNOSIS — Z8249 Family history of ischemic heart disease and other diseases of the circulatory system: Secondary | ICD-10-CM | POA: Diagnosis not present

## 2018-07-19 DIAGNOSIS — I509 Heart failure, unspecified: Secondary | ICD-10-CM | POA: Diagnosis not present

## 2018-07-19 DIAGNOSIS — I25810 Atherosclerosis of coronary artery bypass graft(s) without angina pectoris: Secondary | ICD-10-CM | POA: Diagnosis not present

## 2018-07-19 LAB — CBC WITH DIFFERENTIAL/PLATELET
Abs Immature Granulocytes: 0.05 10*3/uL (ref 0.00–0.07)
BASOS ABS: 0.1 10*3/uL (ref 0.0–0.1)
Basophils Relative: 0 %
Eosinophils Absolute: 0.1 10*3/uL (ref 0.0–0.5)
Eosinophils Relative: 1 %
HCT: 37.5 % — ABNORMAL LOW (ref 39.0–52.0)
HEMOGLOBIN: 11.6 g/dL — AB (ref 13.0–17.0)
Immature Granulocytes: 0 %
Lymphocytes Relative: 7 %
Lymphs Abs: 0.9 10*3/uL (ref 0.7–4.0)
MCH: 29.1 pg (ref 26.0–34.0)
MCHC: 30.9 g/dL (ref 30.0–36.0)
MCV: 94 fL (ref 80.0–100.0)
Monocytes Absolute: 0.8 10*3/uL (ref 0.1–1.0)
Monocytes Relative: 7 %
Neutro Abs: 10.9 10*3/uL — ABNORMAL HIGH (ref 1.7–7.7)
Neutrophils Relative %: 85 %
Platelets: 184 10*3/uL (ref 150–400)
RBC: 3.99 MIL/uL — ABNORMAL LOW (ref 4.22–5.81)
RDW: 16 % — ABNORMAL HIGH (ref 11.5–15.5)
WBC: 12.9 10*3/uL — ABNORMAL HIGH (ref 4.0–10.5)
nRBC: 0 % (ref 0.0–0.2)

## 2018-07-19 LAB — COMPREHENSIVE METABOLIC PANEL
ALBUMIN: 3.4 g/dL — AB (ref 3.5–5.0)
ALT: 11 U/L (ref 0–44)
AST: 23 U/L (ref 15–41)
Alkaline Phosphatase: 130 U/L — ABNORMAL HIGH (ref 38–126)
Anion gap: 7 (ref 5–15)
BUN: 27 mg/dL — ABNORMAL HIGH (ref 8–23)
CO2: 25 mmol/L (ref 22–32)
Calcium: 8.3 mg/dL — ABNORMAL LOW (ref 8.9–10.3)
Chloride: 108 mmol/L (ref 98–111)
Creatinine, Ser: 1.97 mg/dL — ABNORMAL HIGH (ref 0.61–1.24)
GFR calc Af Amer: 37 mL/min — ABNORMAL LOW (ref >60)
GFR calc non Af Amer: 32 mL/min — ABNORMAL LOW (ref >60)
Glucose, Bld: 119 mg/dL — ABNORMAL HIGH (ref 70–99)
Potassium: 3.8 mmol/L (ref 3.5–5.1)
Sodium: 140 mmol/L (ref 135–145)
Total Bilirubin: 1.8 mg/dL — ABNORMAL HIGH (ref 0.3–1.2)
Total Protein: 6.6 g/dL (ref 6.5–8.1)

## 2018-07-19 LAB — BRAIN NATRIURETIC PEPTIDE: B Natriuretic Peptide: 3561 pg/mL — ABNORMAL HIGH (ref 0.0–100.0)

## 2018-07-19 LAB — TROPONIN I: Troponin I: <0.03 ng/mL (ref <0.03)

## 2018-07-19 MED ORDER — HYDRALAZINE HCL 25 MG PO TABS
25.0000 mg | ORAL_TABLET | Freq: Three times a day (TID) | ORAL | Status: DC
  Administered 2018-07-19 – 2018-07-22 (×10): 25 mg via ORAL
  Filled 2018-07-19 (×10): qty 1

## 2018-07-19 MED ORDER — AMIODARONE HCL 200 MG PO TABS
200.0000 mg | ORAL_TABLET | Freq: Every day | ORAL | Status: DC
  Administered 2018-07-19 – 2018-07-22 (×4): 200 mg via ORAL
  Filled 2018-07-19 (×4): qty 1

## 2018-07-19 MED ORDER — ATORVASTATIN CALCIUM 20 MG PO TABS
20.0000 mg | ORAL_TABLET | Freq: Every day | ORAL | Status: DC
  Administered 2018-07-19 – 2018-07-22 (×4): 20 mg via ORAL
  Filled 2018-07-19 (×4): qty 1

## 2018-07-19 MED ORDER — ISOSORBIDE MONONITRATE ER 30 MG PO TB24
30.0000 mg | ORAL_TABLET | Freq: Every day | ORAL | Status: DC
  Administered 2018-07-19 – 2018-07-22 (×4): 30 mg via ORAL
  Filled 2018-07-19 (×4): qty 1

## 2018-07-19 MED ORDER — CARVEDILOL 25 MG PO TABS
25.0000 mg | ORAL_TABLET | Freq: Two times a day (BID) | ORAL | Status: DC
  Administered 2018-07-19 – 2018-07-21 (×5): 25 mg via ORAL
  Filled 2018-07-19 (×5): qty 1

## 2018-07-19 MED ORDER — FUROSEMIDE 10 MG/ML IJ SOLN
20.0000 mg | Freq: Two times a day (BID) | INTRAMUSCULAR | Status: DC
  Administered 2018-07-19 – 2018-07-20 (×2): 20 mg via INTRAVENOUS
  Filled 2018-07-19 (×2): qty 2

## 2018-07-19 MED ORDER — FLUTICASONE PROPIONATE 50 MCG/ACT NA SUSP
1.0000 | Freq: Every day | NASAL | Status: DC
  Administered 2018-07-20 – 2018-07-22 (×2): 1 via NASAL
  Filled 2018-07-19: qty 16

## 2018-07-19 MED ORDER — MONTELUKAST SODIUM 10 MG PO TABS
10.0000 mg | ORAL_TABLET | Freq: Every day | ORAL | Status: DC
  Administered 2018-07-19: 10 mg via ORAL
  Filled 2018-07-19 (×2): qty 1

## 2018-07-19 MED ORDER — FUROSEMIDE 10 MG/ML IJ SOLN
40.0000 mg | Freq: Once | INTRAMUSCULAR | Status: AC
  Administered 2018-07-19: 40 mg via INTRAVENOUS

## 2018-07-19 MED ORDER — TIMOLOL MALEATE 0.5 % OP SOLN
1.0000 [drp] | Freq: Two times a day (BID) | OPHTHALMIC | Status: DC
  Administered 2018-07-19 – 2018-07-22 (×7): 1 [drp] via OPHTHALMIC
  Filled 2018-07-19: qty 5

## 2018-07-19 MED ORDER — FUROSEMIDE 10 MG/ML IJ SOLN
INTRAMUSCULAR | Status: AC
  Filled 2018-07-19: qty 4

## 2018-07-19 MED ORDER — LORATADINE 10 MG PO TABS
10.0000 mg | ORAL_TABLET | Freq: Every day | ORAL | Status: DC
  Administered 2018-07-19 – 2018-07-21 (×3): 10 mg via ORAL
  Filled 2018-07-19 (×4): qty 1

## 2018-07-19 MED ORDER — POTASSIUM CHLORIDE CRYS ER 20 MEQ PO TBCR
20.0000 meq | EXTENDED_RELEASE_TABLET | Freq: Every day | ORAL | Status: DC
  Administered 2018-07-19 – 2018-07-21 (×3): 20 meq via ORAL
  Filled 2018-07-19 (×3): qty 1
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
## 2018-07-19 NOTE — H&P (Signed)
[facility] - [place] at [facility]   PATIENT NAME: Larry Lawrence    MR#:  [id number]  DATE OF BIRTH:  1940/09/21  DATE OF ADMISSION:  07/19/2018  PRIMARY CARE PHYSICIAN: Duanne Limerick, MD   REQUESTING/REFERRING PHYSICIAN: Myrna Blazer, MD  CHIEF COMPLAINT:  Shortness of breath  HISTORY OF PRESENT ILLNESS:  Larry Lawrence  is a 78 y.o. male with a known history of chronic systolic congestive heart failure, coronary artery disease, gout, allergic rhinitis and other medical problems is presenting to the ED with a chief complaint of shortness of breath for the past 1 day.  He said shortness of breath is getting worse with minimal exertion but denies any chest pain.  Please medication was changed from Lasix to torsemide and patient was just seen by Kovalski last week.  No fever no cough son at bedside.  PAST MEDICAL HISTORY:   Past Medical History:  Diagnosis Date  . Allergy   . CHF (congestive heart failure) (HCC)   . Coronary artery disease   . Gout   . Insomnia   . Renal disorder   . Sleeps in sitting position due to orthopnea   . TIA (transient ischemic attack)     PAST SURGICAL HISTOIRY:   Past Surgical History:  Procedure Laterality Date  . CARDIAC CATHETERIZATION N/A 02/02/2016   Procedure: Left Heart Cath and Coronary Angiography;  Surgeon: Lamar Blinks, MD;  Location: [facility] INVASIVE CV LAB;  Service: Cardiovascular;  Laterality: N/A;  . CARDIAC SURGERY     3 stents in "main artery"  . JOINT REPLACEMENT      SOCIAL HISTORY:   Social History   Tobacco Use  . Smoking status: Former Smoker    Packs/day: 2.00    Years: 3.00    Pack years: 6.00    Types: Cigarettes    Last attempt to quit: 1960    Years since quitting: 60.0  . Smokeless tobacco: Never Used  . Tobacco comment: Smoking cessation materials not required  Substance Use Topics  . Alcohol use: No    FAMILY HISTORY:   Family History  Problem Relation Age of  Onset  . Hypertension Mother   . Pancreatic cancer Mother   . Hypertension Father   . Prostate cancer Father     DRUG ALLERGIES:   Allergies  Allergen Reactions  . Niaspan [Niacin Er] Other (See Comments)    Reaction:  Hot flashes     REVIEW OF SYSTEMS:  CONSTITUTIONAL: No fever, fatigue or weakness.  EYES: No blurred or double vision.  EARS, NOSE, AND THROAT: No tinnitus or ear pain.  RESPIRATORY: No cough, reporting shortness of breath, denies wheezing or hemoptysis.  CARDIOVASCULAR: No chest pain, orthopnea, edema.  GASTROINTESTINAL: No nausea, vomiting, diarrhea or abdominal pain.  GENITOURINARY: No dysuria, hematuria.  ENDOCRINE: No polyuria, nocturia,  HEMATOLOGY: No anemia, easy bruising or bleeding SKIN: No rash or lesion. MUSCULOSKELETAL: No joint pain or arthritis.   NEUROLOGIC: No tingling, numbness, weakness.  PSYCHIATRY: No anxiety or depression.   MEDICATIONS AT HOME:   Prior to Admission medications   Medication Sig Start Date End Date Taking? Authorizing Provider  allopurinol (ZYLOPRIM) 300 MG tablet TAKE (1) TABLET BY MOUTH TWICE DAILY 07/17/18  Yes Duanne Limerick, MD  amiodarone (PACERONE) 200 MG tablet Take 200 mg by mouth daily.    Yes Lamar Blinks, MD  atorvastatin (LIPITOR) 20 MG tablet Take 20 mg by mouth daily.  01/05/18 01/05/19 Yes  [provider]  carvedilol (COREG) 25 MG tablet Take 25 mg by mouth 2 (two) times daily with a meal.    Yes [provider]  hydrALAZINE (APRESOLINE) 25 MG tablet Take 1 tablet (25 mg total) by mouth every 8 (eight) hours. Patient taking differently: Take 50 mg by mouth every 8 (eight) hours.  04/25/17  Yes Auburn BilberryPatel, Shreyang, MD  isosorbide mononitrate (IMDUR) 30 MG 24 hr tablet Take 30 mg by mouth daily.    Yes [provider]  loratadine (CLARITIN) 10 MG tablet Take 1 tablet (10 mg total) by mouth daily. Patient taking differently: Take 10 mg by mouth daily as needed for allergies.  08/06/16   Yes Duanne LimerickJones, Deanna C, MD  mometasone (NASONEX) 50 MCG/ACT nasal spray Place 2 sprays into the nose daily. 09/29/17  Yes Duanne LimerickJones, Deanna C, MD  montelukast (SINGULAIR) 10 MG tablet TAKE ONE TABLET AT BEDTIME. Patient taking differently: Take 10 mg by mouth at bedtime.  04/03/18  Yes Duanne LimerickJones, Deanna C, MD  potassium chloride SA (K-DUR,KLOR-CON) 20 MEQ tablet Take 1 tablet (20 mEq total) by mouth 3 (three) times daily. 08/06/16  Yes Duanne LimerickJones, Deanna C, MD  sacubitril-valsartan (ENTRESTO) 24-26 MG Take 1 tablet by mouth 2 (two) times daily.  03/31/17  Yes [provider]  timolol (TIMOPTIC) 0.5 % ophthalmic solution Place 1 drop into both eyes 2 (two) times daily. Dr Inez PilgrimBrasington   Yes [provider]  torsemide (DEMADEX) 20 MG tablet Take 2 tablets (40 mg total) by mouth daily. 07/03/18  Yes Shaune Pollackhen, Qing, MD      VITAL SIGNS:  Blood pressure (!) 145/69, pulse 69, temperature 98.4 F (36.9 C), temperature source Oral, resp. rate 20, weight 103 kg, SpO2 98 %.  PHYSICAL EXAMINATION:  GENERAL:  78 y.o.-year-old patient lying in the bed with no acute distress.  EYES: Pupils equal, round, reactive to light and accommodation. No scleral icterus. Extraocular muscles intact.  HEENT: Head atraumatic, normocephalic. Oropharynx and nasopharynx clear.  NECK:  Supple, no jugular venous distention. No thyroid enlargement, no tenderness.  LUNGS: Moderate breath sounds bilaterally, no wheezing, positive bilateral rales,rhonchi, no crepitation. No use of accessory muscles of respiration.  CARDIOVASCULAR: S1, S2 normal. No murmurs, rubs, or gallops.  ABDOMEN: Soft, nontender, nondistended. Bowel sounds present. EXTREMITIES: Positive pedal edema, no cyanosis, or clubbing.  NEUROLOGIC: Awake, alert and oriented x3 sensation intact. Gait not checked.  PSYCHIATRIC: The patient is alert and oriented x 3.  SKIN: No obvious rash, lesion, or ulcer.   LABORATORY PANEL:   CBC Recent Labs  Lab 07/19/18 0651  WBC  12.9*  HGB 11.6*  HCT 37.5*  PLT 184   ------------------------------------------------------------------------------------------------------------------  Chemistries  Recent Labs  Lab 07/19/18 0651  NA 140  K 3.8  CL 108  CO2 25  GLUCOSE 119*  BUN 27*  CREATININE 1.97*  CALCIUM 8.3*  AST 23  ALT 11  ALKPHOS 130*  BILITOT 1.8*   ------------------------------------------------------------------------------------------------------------------  Cardiac Enzymes Recent Labs  Lab 07/19/18 0651  TROPONINI <0.03   ------------------------------------------------------------------------------------------------------------------  RADIOLOGY:  Dg Chest 2 View  Result Date: 07/19/2018 CLINICAL DATA:  Dyspnea EXAM: CHEST - 2 VIEW COMPARISON:  06/30/2018 chest radiograph. FINDINGS: Stable cardiomediastinal silhouette with mild cardiomegaly. No pneumothorax. Small bilateral pleural effusions. Hazy bibasilar lung opacities and mild pulmonary edema. IMPRESSION: 1. Mild congestive heart failure. 2. Small bilateral pleural effusions. 3. Hazy bibasilar lung opacities, nonspecific, probably atelectasis. Electronically Signed   By: Delbert PhenixJason A Poff M.D.   On: 07/19/2018  07:03    EKG:   Orders placed or performed during the hospital encounter of 07/19/18  . ED EKG  . ED EKG  . EKG 12-Lead  . EKG 12-Lead    IMPRESSION AND PLAN:     #Acute on chronic CHF-systolic Admit to telemetry IV Lasix with close monitoring of the renal function  Monitor intake and output and daily weights Continue home medication Imdur statin  #Acute kidney injury on chronic kidney disease Hold Entresto, while patient is on IV Lasix monitor renal function closely  #Essential hypertension Continue home medication Coreg, hydralazine, Imdur and hold Entresto  #Gout Holding allopurinol for now  #Allergic rhinitis continue Nasonex  All the records are reviewed and case discussed with ED provider. Management  plans discussed with the patient, family and they are in agreement.  CODE STATUS: DNR  TOTAL TIME TAKING CARE OF THIS PATIENT: 43 minutes.   Note: This dictation was prepared with Dragon dictation along with smaller phrase technology. Any transcriptional errors that result from this process are unintentional.  Ramonita LabAruna Savas Elvin M.D on 07/19/2018 at 11:33 AM  Between 7am to 6pm - Pager - [phone]  After 6pm go to [email or web address] - password [email or web address] [facility]  [facility]  Office  [phone]  CC: Primary care physician; Duanne LimerickJones, Deanna C, MD

## 2018-07-19 NOTE — ED Notes (Signed)
Tammy RN took report as this RN attempted report 3 times and attending nurse is not available. Rn left name and ascom number if attending RN has any questions.

## 2018-07-19 NOTE — ED Provider Notes (Signed)
[facility] Emergency Department Provider Note  ____________________________________________   First MD Initiated Contact with Patient 07/19/18 0700     (approximate)  I have reviewed the triage vital signs and the nursing notes.   HISTORY  Chief Complaint Shortness of Breath   HPI Larry Lawrence is a 78 y.o. male with a history of CHF on torsemide who is presenting with shortness of breath over the past 24 hours.  He says that it is worse when he has up and walking but has minor shortness of breath when he is resting, sitting in the bed.  Denying any chest pain.  Says that he is compliant with his torsemide.  Says that he has been trying to fluid restrict as well as diet control sodium.  Was just admitted several weeks ago.  Changed from Lasix to torsemide.  Concerned about kidney failure as well.  I just seen Dr. Gwen PoundsKowalski this past week and is following up with heart failure clinic this Tuesday.  No cough, chills or fever.   Past Medical History:  Diagnosis Date  . Allergy   . CHF (congestive heart failure) (HCC)   . Coronary artery disease   . Gout   . Insomnia   . Renal disorder   . Sleeps in sitting position due to orthopnea   . TIA (transient ischemic attack)     Patient Active Problem List   Diagnosis Date Noted  . Acute CHF (congestive heart failure) (HCC) 06/30/2018  . Near syncope 04/24/2017  . Bradycardia 04/24/2017  . Gout 02/01/2016  . Syncope 08/09/2015  . Hypokalemia 08/09/2015  . CKD (chronic kidney disease), stage III (HCC) 02/20/2015  . Chronic systolic CHF (congestive heart failure) (HCC) 02/20/2015  . CKD (chronic kidney disease) 02/20/2015    Past Surgical History:  Procedure Laterality Date  . CARDIAC CATHETERIZATION N/A 02/02/2016   Procedure: Left Heart Cath and Coronary Angiography;  Surgeon: Lamar BlinksBruce J Kowalski, MD;  Location: [facility] INVASIVE CV LAB;  Service: Cardiovascular;  Laterality: N/A;  . CARDIAC SURGERY     3  stents in "main artery"  . JOINT REPLACEMENT      Prior to Admission medications   Medication Sig Start Date End Date Taking? Authorizing Provider  allopurinol (ZYLOPRIM) 300 MG tablet TAKE (1) TABLET BY MOUTH TWICE DAILY 07/17/18   Duanne LimerickJones, Deanna C, MD  amiodarone (PACERONE) 200 MG tablet Take 200 mg by mouth daily.     [name], [name], MD  atorvastatin (LIPITOR) 20 MG tablet Take 20 mg by mouth daily.  01/05/18 01/05/19  [provider]  carvedilol (COREG) 25 MG tablet Take 25 mg by mouth 2 (two) times daily with a meal.     [provider]  hydrALAZINE (APRESOLINE) 25 MG tablet Take 1 tablet (25 mg total) by mouth every 8 (eight) hours. Patient taking differently: Take 50 mg by mouth every 8 (eight) hours.  04/25/17   Auburn BilberryPatel, Shreyang, MD  isosorbide mononitrate (IMDUR) 30 MG 24 hr tablet Take 30 mg by mouth daily.     [provider]  loratadine (CLARITIN) 10 MG tablet Take 1 tablet (10 mg total) by mouth daily. Patient taking differently: Take 10 mg by mouth daily as needed for allergies.  08/06/16   Duanne LimerickJones, Deanna C, MD  mometasone (NASONEX) 50 MCG/ACT nasal spray Place 2 sprays into the nose daily. 09/29/17   Duanne LimerickJones, Deanna C, MD  montelukast (SINGULAIR) 10 MG tablet TAKE ONE TABLET AT BEDTIME. Patient taking differently: Take  10 mg by mouth at bedtime.  04/03/18   Duanne LimerickJones, Deanna C, MD  potassium chloride SA (K-DUR,KLOR-CON) 20 MEQ tablet Take 1 tablet (20 mEq total) by mouth 3 (three) times daily. 08/06/16   Duanne LimerickJones, Deanna C, MD  sacubitril-valsartan (ENTRESTO) 24-26 MG Take 1 tablet by mouth 2 (two) times daily.  03/31/17   [provider]  timolol (TIMOPTIC) 0.5 % ophthalmic solution Place 1 drop into both eyes 2 (two) times daily. Dr Inez PilgrimBrasington    [provider]  torsemide (DEMADEX) 20 MG tablet Take 2 tablets (40 mg total) by mouth daily. 07/03/18   Shaune Pollackhen, Qing, MD    Allergies Niaspan [niacin er]  Family History  Problem Relation Age of Onset  .  Hypertension Mother   . Pancreatic cancer Mother   . Hypertension Father   . Prostate cancer Father     Social History Social History   Tobacco Use  . Smoking status: Former Smoker    Packs/day: 2.00    Years: 3.00    Pack years: 6.00    Types: Cigarettes    Last attempt to quit: 1960    Years since quitting: 60.0  . Smokeless tobacco: Never Used  . Tobacco comment: Smoking cessation materials not required  Substance Use Topics  . Alcohol use: No  . Drug use: No    Review of Systems  Constitutional: No fever/chills Eyes: No visual changes. ENT: No sore throat. Cardiovascular: Denies chest pain. Respiratory: As above Gastrointestinal: No abdominal pain.  No nausea, no vomiting.  No diarrhea.  No constipation. Genitourinary: Negative for dysuria. Musculoskeletal: Negative for back pain. Skin: Negative for rash. Neurological: Negative for headaches, focal weakness or numbness.   ____________________________________________   PHYSICAL EXAM:  VITAL SIGNS: ED Triage Vitals  Enc Vitals Group     BP 07/19/18 0545 (!) 152/72     Pulse Rate 07/19/18 0545 72     Resp 07/19/18 0545 18     Temp 07/19/18 0545 98.4 F (36.9 C)     Temp Source 07/19/18 0545 Oral     SpO2 07/19/18 0545 96 %     Weight 07/19/18 0704 226 lb 15.8 oz (103 kg)     Height --      Head Circumference --      Peak Flow --      Pain Score 07/19/18 0542 0     Pain Loc --      Pain Edu? --      Excl. in GC? --     Constitutional: Alert and oriented. Well appearing and in no acute distress. Eyes: Conjunctivae are normal.  Head: Atraumatic. Nose: No congestion/rhinnorhea. Mouth/Throat: Mucous membranes are moist.  Neck: No stridor.   Cardiovascular: Normal rate, regular rhythm. Grossly normal heart sounds.   Respiratory: Normal respiratory effort.  Rales to the bilateral bases.  No respiratory distress.  Speaks in full sentences. Gastrointestinal: Soft and nontender. No distention. No CVA  tenderness. Musculoskeletal: Moderate bilateral lower extremity edema.  Patient says this is not worsened from previous. Neurologic:  Normal speech and language. No gross focal neurologic deficits are appreciated. Skin:  Skin is warm, dry and intact. No rash noted. Psychiatric: Mood and affect are normal. Speech and behavior are normal.  ____________________________________________   LABS (all labs ordered are listed, but only abnormal results are displayed)  Labs Reviewed  CBC WITH DIFFERENTIAL/PLATELET - Abnormal; Notable for the following components:      Result Value   WBC 12.9 (*)  RBC 3.99 (*)    Hemoglobin 11.6 (*)    HCT 37.5 (*)    RDW 16.0 (*)    Neutro Abs 10.9 (*)    All other components within normal limits  COMPREHENSIVE METABOLIC PANEL - Abnormal; Notable for the following components:   Glucose, Bld 119 (*)    BUN 27 (*)    Creatinine, Ser 1.97 (*)    Calcium 8.3 (*)    Albumin 3.4 (*)    Alkaline Phosphatase 130 (*)    Total Bilirubin 1.8 (*)    GFR calc non Af Amer 32 (*)    GFR calc Af Amer 37 (*)    All other components within normal limits  BRAIN NATRIURETIC PEPTIDE - Abnormal; Notable for the following components:   B Natriuretic Peptide 3,561.0 (*)    All other components within normal limits  TROPONIN I   ____________________________________________  EKG  ED ECG REPORT I, Arelia Longest, the attending physician, personally viewed and interpreted this ECG.   Date: 07/19/2018  EKG Time: 0658  Rate: 68  Rhythm: normal sinus rhythm  Axis: Normal  Intervals:nonspecific intraventricular conduction delay  ST&T Change: No ST segment elevation or depression.  No abnormal T wave inversion.  ____________________________________________  RADIOLOGY  Mild congestive heart failure with small bilateral pleural effusions.  Hazy bibasilar lung opacities nonspecific and probably  atelectasis ____________________________________________   PROCEDURES  Procedure(s) performed:   Procedures  Critical Care performed:   ____________________________________________   INITIAL IMPRESSION / ASSESSMENT AND PLAN / ED COURSE  Pertinent labs & imaging results that were available during my care of the patient were reviewed by me and considered in my medical decision making (see chart for details).  Differential includes, but is not limited to, viral syndrome, bronchitis including COPD exacerbation, pneumonia, reactive airway disease including asthma, CHF including exacerbation with or without pulmonary/interstitial edema, pneumothorax, ACS, thoracic trauma, and pulmonary embolism. As part of my medical decision making, I reviewed the following data within the electronic MEDICAL RECORD NUMBER Notes from prior ED visits and [place] Controlled Substance Database  ----------------------------------------- 8:41 AM on 07/19/2018 -----------------------------------------  I discussed the case with Dr. Gwen Pounds who is familiar with the patient.  Patient appears to be in CHF with an elevated BNP as well as a chest x-ray indicative of this.  Patient also with an elevated creatinine.  We discussed further treatment.  We will try the patient on 40 of Lasix and reevaluate.  Will continue the home torsemide dosing.  We discussed being very careful with this patient regarding diuresis because of his chronic kidney disease.  I then had a discussion with the patient and family.  He would like to try the Lasix and see how he feels.  Would prefer to be discharged home to follow-up with heart failure clinic this Tuesday.  ----------------------------------------- 9:58 AM on 07/19/2018 -----------------------------------------  Patient given 40 mg of IV Lasix.  Says that he is urinated once and is still feeling short of breath especially when standing.  Says that he is normally able to ambulate to his  bathroom without any assistance but here in the emergency department cannot even stand without assistance and labored breathing.  Patient will be admitted to the hospital.  Signed out to the hospitalist.  Patient understanding the diagnosis well treatment plan and willing to comply. ____________________________________________   FINAL CLINICAL IMPRESSION(S) / ED DIAGNOSES  CHF.  NEW MEDICATIONS STARTED DURING THIS VISIT:  New Prescriptions   No medications on file  Note:  This document was prepared using Dragon voice recognition software and may include unintentional dictation errors.     Myrna Blazer, MD 07/19/18 [phone]

## 2018-07-19 NOTE — ED Notes (Signed)
Pt states that yesterday he started to become SOB, wheezing and he feels like he having trouble walking as well. Pt has Hx of CHF. Pt is alert and oriented x 4. Family at the bedside.

## 2018-07-19 NOTE — ED Notes (Signed)
Hand-off received from [place] RN at this time.

## 2018-07-19 NOTE — ED Triage Notes (Signed)
Reports feeling short of breath since Saturday evening.

## 2018-07-19 NOTE — Progress Notes (Signed)
Family Meeting Note  Advance Directive:yes  Today a meeting took place with the Patient.  The following clinical team members were present during this meeting:MD  The following were discussed:Patient's diagnosis: Acute in chronic systolic congestive heart failure, acute kidney injury and chronic kidney disease, gout, hypertension, coronary artery disease, allergic rhinitis and treatment plan of care discussed in detail with the patient.  He verbalized understanding of the plan.   , Patient's progosis: Unable to determine and Goals for treatment: DNR, grandson Sharia Reeve and son Mariana Kaufman or healthcare POA  Additional follow-up to be provided: Hospitalist and cardiology  Time spent during discussion:17 min  Ramonita Lab, MD

## 2018-07-19 NOTE — ED Notes (Addendum)
Report attempted at this time.

## 2018-07-19 NOTE — ED Notes (Signed)
Admitting MD at bedside with family at this time.

## 2018-07-20 ENCOUNTER — Inpatient Hospital Stay
Admit: 2018-07-20 | Discharge: 2018-07-20 | Disposition: A | Discharge status: Home / Self Care | Payer: Medicare Other | Attending: Internal Medicine | Admitting: Internal Medicine

## 2018-07-20 LAB — URINALYSIS, ROUTINE W REFLEX MICROSCOPIC
Bilirubin Urine: NEGATIVE
Glucose, UA: NEGATIVE mg/dL
Ketones, ur: NEGATIVE mg/dL
Nitrite: NEGATIVE
Protein, ur: 30 mg/dL — AB
Specific Gravity, Urine: 1.013 (ref 1.005–1.030)
WBC, UA: >50 WBC/hpf — ABNORMAL HIGH (ref 0–5)
pH: 5 (ref 5.0–8.0)

## 2018-07-20 LAB — PROTEIN / CREATININE RATIO, URINE
CREATININE, URINE: 93 mg/dL
Protein Creatinine Ratio: 0.52 mg/mg{Cre} — ABNORMAL HIGH (ref 0.00–0.15)
Total Protein, Urine: 48 mg/dL

## 2018-07-20 LAB — BASIC METABOLIC PANEL
Anion gap: 6 (ref 5–15)
BUN: 31 mg/dL — ABNORMAL HIGH (ref 8–23)
CALCIUM: 8.1 mg/dL — AB (ref 8.9–10.3)
CO2: 24 mmol/L (ref 22–32)
Chloride: 110 mmol/L (ref 98–111)
Creatinine, Ser: 1.95 mg/dL — ABNORMAL HIGH (ref 0.61–1.24)
GFR calc Af Amer: 38 mL/min — ABNORMAL LOW (ref >60)
GFR, EST NON AFRICAN AMERICAN: 33 mL/min — AB (ref >60)
Glucose, Bld: 107 mg/dL — ABNORMAL HIGH (ref 70–99)
Potassium: 3 mmol/L — ABNORMAL LOW (ref 3.5–5.1)
Sodium: 140 mmol/L (ref 135–145)

## 2018-07-20 LAB — GLUCOSE, CAPILLARY: Glucose-Capillary: 128 mg/dL — ABNORMAL HIGH (ref 70–99)

## 2018-07-20 LAB — ECHOCARDIOGRAM COMPLETE
HEIGHTINCHES: 71 in
Weight: 3504 oz

## 2018-07-20 MED ORDER — POTASSIUM CHLORIDE CRYS ER 20 MEQ PO TBCR
20.0000 meq | EXTENDED_RELEASE_TABLET | Freq: Once | ORAL | Status: AC
  Administered 2018-07-20: 20 meq via ORAL
  Filled 2018-07-20: qty 1

## 2018-07-20 MED ORDER — SODIUM CHLORIDE 0.9% FLUSH
3.0000 mL | Freq: Two times a day (BID) | INTRAVENOUS | Status: DC
  Administered 2018-07-20 – 2018-07-22 (×4): 3 mL via INTRAVENOUS

## 2018-07-20 MED ORDER — HEPARIN SODIUM (PORCINE) 5000 UNIT/ML IJ SOLN
5000.0000 [IU] | Freq: Three times a day (TID) | INTRAMUSCULAR | Status: DC
  Administered 2018-07-20 – 2018-07-22 (×7): 5000 [IU] via SUBCUTANEOUS
  Filled 2018-07-20 (×7): qty 1

## 2018-07-20 MED ORDER — FUROSEMIDE 10 MG/ML IJ SOLN
40.0000 mg | Freq: Two times a day (BID) | INTRAMUSCULAR | Status: DC
  Administered 2018-07-20 – 2018-07-21 (×2): 40 mg via INTRAVENOUS
  Filled 2018-07-20 (×2): qty 4

## 2018-07-20 NOTE — Care Management Note (Signed)
Case Management Note  Patient Details  Name: Larry Lawrence MRN: [id number] Date of Birth: 11/09/1940  Subjective/Objective:                 Patient  admitted with heart failure from home.  On room air.  No issues accessing medical care, obtaining medications or transportation. Has walker at home.  CM discussed that home ehalth has been declined and [facility] has reached out without return call.  Per Larry Lawrence - we do not need any services.  He is doing Clinical research associate.  CM discussed daily weights and informed patient does weigh daily and he weighs the same.  Discussed sodium intake and daughter says ' that is what they were saying but he does not each salt."  CM discussed that does not have to add salt for food to be high in sodium.  Verbalized understanding  Action/Plan:   Expected Discharge Date:                  Expected Discharge Plan:  Home/Self Care  In-House Referral:     Discharge planning Services  CM Consult  Post Acute Care Choice:    Choice offered to:     DME Arranged:    DME Agency:     HH Arranged:  Patient Refused HH. Daughter refused service and [facility] HH Agency:     Status of Service:  In process, will continue to follow  If discussed at Long Length of Stay Meetings, dates discussed:    Additional Comments:  Larry Hong, RN 07/20/2018, 2:39 PM

## 2018-07-20 NOTE — Plan of Care (Signed)
Nutrition Education Note  RD consulted for nutrition education regarding CHF.  78 y.o. male with known coronary atherosclerosis, essential hypertension, mixed hyperlipidemia, chronic kidney disease stage III, chronic systolic dysfunction congestive heart failure.    RD provided "Low Sodium Nutrition Therapy" handout from the [employer]. Reviewed patient's dietary recall. Provided examples on ways to decrease sodium intake in diet. Discouraged intake of processed foods and use of salt shaker. Encouraged fresh fruits and vegetables as well as whole grain sources of carbohydrates to maximize fiber intake.   Discussed fluid restriction, why this is important and gave examples of cups compared to milliliters.   RD discussed why it is important for patient to adhere to diet recommendations, and emphasized the role of fluids, foods to avoid, and importance of weighing self daily. Teach back method used.  Expect poor compliance.  Body mass index is 30.54 kg/m. Pt meets criteria for overweight based on current BMI.  Current diet order is HH, patient is consuming approximately 75-100% of meals at this time. Pt reports he does not like the hospital food. Labs and medications reviewed. No further nutrition interventions warranted at this time. RD contact information provided. If additional nutrition issues arise, please re-consult RD.   Betsey Holidayasey Joseangel Nettleton MS, RD, LDN Pager #- [phone] Office#- [phone] After Hours Pager: [phone]

## 2018-07-20 NOTE — Consult Note (Signed)
[facility] Cardiology Consultation Note  Patient ID: Larry Lawrence, MRN: [id number], DOB/AGE: [birth year] 78 y.o. Admit date: 07/19/2018   Date of Consult: 07/20/2018 Primary Physician: Duanne Limerick, MD Primary Cardiologist: Gwen Pounds  Chief Complaint:  Chief Complaint  Patient presents with  . Shortness of Breath   Reason for Consult: Heart failure  HPI: 78 y.o. male with known coronary atherosclerosis essential hypertension mixed hyperlipidemia chronic kidney disease stage III with chronic systolic dysfunction congestive heart failure.  The patient has had repeat echocardiogram in the recent past with no evidence of changes in LV systolic dysfunction valvular heart disease.  Additionally the patient has not had any elevation of troponin or myocardial infarction or ischemic changes or symptoms of angina in the last several months.  He has had appropriate medication management for his cardiovascular disease and congestive heart failure with no changes in chronic kidney disease anemia or recent infection.  He has had recurrent hospitalizations with worsening lower extremity edema pulmonary edema shortness of breath and elevated BNP.  Current BNP B is 3581.  Chest x-ray shows basilar congestion an EKG shows normal sinus rhythm with first-degree AV block with lateral T wave inversion unchanged from before.  We have pressed the patient many times about his diet and he claims that there is no salt in his diet at this time although I have no further explanation for his recurrent episodes of acute on chronic systolic dysfunction heart failure despite changes in his medication management and diuretic from Lasix to torsemide in the last week.  Past Medical History:  Diagnosis Date  . Allergy   . CHF (congestive heart failure) (HCC)   . Coronary artery disease   . Gout   . Insomnia   . Renal disorder   . Sleeps in sitting position due to orthopnea   . TIA (transient ischemic attack)        Surgical History:  Past Surgical History:  Procedure Laterality Date  . CARDIAC CATHETERIZATION N/A 02/02/2016   Procedure: Left Heart Cath and Coronary Angiography;  Surgeon: Lamar Blinks, MD;  Location: [facility] INVASIVE CV LAB;  Service: Cardiovascular;  Laterality: N/A;  . CARDIAC SURGERY     3 stents in "main artery"  . JOINT REPLACEMENT       Home Meds: Prior to Admission medications   Medication Sig Start Date End Date Taking? Authorizing Provider  allopurinol (ZYLOPRIM) 300 MG tablet TAKE (1) TABLET BY MOUTH TWICE DAILY 07/17/18  Yes Duanne Limerick, MD  amiodarone (PACERONE) 200 MG tablet Take 200 mg by mouth daily.    Yes Lamar Blinks, MD  atorvastatin (LIPITOR) 20 MG tablet Take 20 mg by mouth daily.  01/05/18 01/05/19 Yes [provider]  carvedilol (COREG) 25 MG tablet Take 25 mg by mouth 2 (two) times daily with a meal.    Yes [provider]  hydrALAZINE (APRESOLINE) 25 MG tablet Take 1 tablet (25 mg total) by mouth every 8 (eight) hours. Patient taking differently: Take 50 mg by mouth every 8 (eight) hours.  04/25/17  Yes Auburn Bilberry, MD  isosorbide mononitrate (IMDUR) 30 MG 24 hr tablet Take 30 mg by mouth daily.    Yes [provider]  loratadine (CLARITIN) 10 MG tablet Take 1 tablet (10 mg total) by mouth daily. Patient taking differently: Take 10 mg by mouth daily as needed for allergies.  08/06/16  Yes Duanne Limerick, MD  mometasone (NASONEX) 50 MCG/ACT nasal spray Place 2 sprays into  the nose daily. 09/29/17  Yes Duanne LimerickJones, Deanna C, MD  montelukast (SINGULAIR) 10 MG tablet TAKE ONE TABLET AT BEDTIME. Patient taking differently: Take 10 mg by mouth at bedtime.  04/03/18  Yes Duanne LimerickJones, Deanna C, MD  potassium chloride SA (K-DUR,KLOR-CON) 20 MEQ tablet Take 1 tablet (20 mEq total) by mouth 3 (three) times daily. 08/06/16  Yes Duanne LimerickJones, Deanna C, MD  sacubitril-valsartan (ENTRESTO) 24-26 MG Take 1 tablet by mouth 2 (two) times daily.  03/31/17  Yes  [provider]  timolol (TIMOPTIC) 0.5 % ophthalmic solution Place 1 drop into both eyes 2 (two) times daily. Dr Inez PilgrimBrasington   Yes [provider]  torsemide (DEMADEX) 20 MG tablet Take 2 tablets (40 mg total) by mouth daily. 07/03/18  Yes Shaune Pollackhen, Qing, MD    Inpatient Medications:  . amiodarone  200 mg Oral Daily  . atorvastatin  20 mg Oral Daily  . carvedilol  25 mg Oral BID WC  . fluticasone  1 spray Each Nare Daily  . furosemide  20 mg Intravenous BID  . hydrALAZINE  25 mg Oral Q8H  . isosorbide mononitrate  30 mg Oral Daily  . loratadine  10 mg Oral Daily  . montelukast  10 mg Oral QHS  . potassium chloride SA  20 mEq Oral Daily  . timolol  1 drop Both Eyes BID     Allergies:  Allergies  Allergen Reactions  . Niaspan [Niacin Er] Other (See Comments)    Reaction:  Hot flashes     Social History   Socioeconomic History  . Marital status: Married    Spouse name: Not on file  . Number of children: 2  . Years of education: Not on file  . Highest education level: 12th grade  Occupational History  . Occupation: retired  Engineer, productionocial Needs  . Financial resource strain: Not hard at all  . Food insecurity:    Worry: Never true    Inability: Never true  . Transportation needs:    Medical: No    Non-medical: No  Tobacco Use  . Smoking status: Former Smoker    Packs/day: 2.00    Years: 3.00    Pack years: 6.00    Types: Cigarettes    Last attempt to quit: 1960    Years since quitting: 60.0  . Smokeless tobacco: Never Used  . Tobacco comment: Smoking cessation materials not required  Substance and Sexual Activity  . Alcohol use: No  . Drug use: No  . Sexual activity: Not Currently  Lifestyle  . Physical activity:    Days per week: 0 days    Minutes per session: 0 min  . Stress: Not at all  Relationships  . Social connections:    Talks on phone: Twice a week    Gets together: Once a week    Attends religious service: More than 4 times per year     Active member of club or organization: No    Attends meetings of clubs or organizations: Never    Relationship status: Married  . Intimate partner violence:    Fear of current or ex partner: No    Emotionally abused: No    Physically abused: No    Forced sexual activity: No  Other Topics Concern  . Not on file  Social History Narrative  . Not on file     Family History  Problem Relation Age of Onset  . Hypertension Mother   . Pancreatic cancer Mother   . Hypertension  Father   . Prostate cancer Father      Review of Systems Positive for this of breath edema cough congestion Negative for: General:  chills, fever, night sweats or weight changes.  Cardiovascular: PND orthopnea syncope dizziness  Dermatological skin lesions rashes Respiratory: Positive for cough congestion Urologic: Frequent urination urination at night and hematuria Abdominal: negative for nausea, vomiting, diarrhea, bright red blood per rectum, melena, or hematemesis Neurologic: negative for visual changes, and/or hearing changes  All other systems reviewed and are otherwise negative except as noted above.  Labs: Recent Labs    07/19/18 0651  TROPONINI <0.03   Lab Results  Component Value Date   WBC 12.9 (H) 07/19/2018   HGB 11.6 (L) 07/19/2018   HCT 37.5 (L) 07/19/2018   MCV 94.0 07/19/2018   PLT 184 07/19/2018    Recent Labs  Lab 07/19/18 0651 07/20/18 0457  NA 140 140  K 3.8 3.0*  CL 108 110  CO2 25 24  BUN 27* 31*  CREATININE 1.97* 1.95*  CALCIUM 8.3* 8.1*  PROT 6.6  --   BILITOT 1.8*  --   ALKPHOS 130*  --   ALT 11  --   AST 23  --   GLUCOSE 119* 107*   No results found for: CHOL, HDL, LDLCALC, TRIG No results found for: DDIMER  Radiology/Studies:  Dg Chest 2 View  Result Date: 07/19/2018 CLINICAL DATA:  Dyspnea EXAM: CHEST - 2 VIEW COMPARISON:  06/30/2018 chest radiograph. FINDINGS: Stable cardiomediastinal silhouette with mild cardiomegaly. No pneumothorax. Small bilateral  pleural effusions. Hazy bibasilar lung opacities and mild pulmonary edema. IMPRESSION: 1. Mild congestive heart failure. 2. Small bilateral pleural effusions. 3. Hazy bibasilar lung opacities, nonspecific, probably atelectasis. Electronically Signed   By: Delbert Phenix M.D.   On: 07/19/2018 07:03   Dg Chest 2 View  Result Date: 06/30/2018 CLINICAL DATA:  Bilateral leg swelling. Worsening shortness of breath. EXAM: CHEST - 2 VIEW COMPARISON:  06/21/2018. FINDINGS: Cardiomegaly with bilateral from interstitial prominence and bilateral pleural effusions. Findings consistent CHF. Low lung volumes with basilar atelectasis. IMPRESSION: Congestive heart failure bilateral pulmonary interstitial edema and bilateral pleural effusions. Low lung volumes with bibasilar atelectasis. Electronically Signed   By: Maisie Fus  Register   On: 06/30/2018 10:25   Ct Head Wo Contrast  Result Date: 06/21/2018 CLINICAL DATA:  Altered mental status, fall tonight. EXAM: CT HEAD WITHOUT CONTRAST TECHNIQUE: Contiguous axial images were obtained from the base of the skull through the vertex without intravenous contrast. COMPARISON:  CT HEAD April 23, 2017 FINDINGS: BRAIN: No intraparenchymal hemorrhage, mass effect nor midline shift. Moderate parenchymal brain volume loss, no hydrocephalus. Confluent supratentorial white matter hypodensities. 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 Beam hardening artifact through lower pons. No acute large vascular territory infarcts. No abnormal extra-axial fluid collections. Basal cisterns are patent. VASCULAR: Moderate calcific atherosclerosis of the carotid siphons. SKULL: No skull fracture. No significant scalp soft tissue swelling. SINUSES/ORBITS: Chronic bilateral mastoiditis relatively stable. Trace paranasal sinus mucosal thickening. Dehiscent RIGHT jugular bulb. The included ocular globes and orbital contents are non-suspicious. Status post bilateral ocular lens implants. OTHER:  None. IMPRESSION: 1. No acute intracranial process. 2. 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 Electronically Signed   By: Awilda Metro M.D.   On: 06/21/2018 01:59   Mr Brain Wo Contrast  Result Date: 06/21/2018 CLINICAL DATA:  Altered mental status, fell tonight. EXAM: MRI HEAD WITHOUT CONTRAST TECHNIQUE: Multiplanar, multiecho  pulse sequences of the brain and surrounding structures were obtained without intravenous contrast. COMPARISON:  CT HEAD June 21, 2018 FINDINGS: Mild motion degraded examination. INTRACRANIAL CONTENTS: No reduced diffusion to suggest acute ischemia. 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 Old bilateral basal ganglia infarcts. Prominent basal ganglia perivascular spaces associated with chronic small vessel ischemic changes. Confluent supratentorial white matter FLAIR T2 hyperintensities. Moderate parenchymal brain volume loss. No hydrocephalus. No suspicious parenchymal signal, masses, mass effect. No abnormal extra-axial fluid collections. No extra-axial masses. VASCULAR: Normal major intracranial vascular flow voids present at skull base. SKULL AND UPPER CERVICAL SPINE: No abnormal sellar expansion. No suspicious calvarial bone marrow signal. Craniocervical junction maintained. SINUSES/ORBITS: Trace LEFT mastoid effusion. The included ocular globes and orbital contents are non-suspicious. Status post bilateral ocular lens implants. OTHER: None. IMPRESSION: 1. No acute intracranial process. 2. Moderate to severe chronic small vessel ischemic changes. 3. 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 Electronically Signed   By: Awilda Metroourtnay  Bloomer M.D.   On: 06/21/2018 05:16   Dg Chest Portable 1 View  Result Date: 06/21/2018 CLINICAL DATA:  Pain and swelling after fall. EXAM: PORTABLE CHEST 1 VIEW COMPARISON:  Chest radiograph May 20, 2018 FINDINGS:  Stable cardiomegaly. Pulmonary vascular congestion mild interstitial prominence with increased patchy bibasilar airspace opacities and small pleural effusions. Calcified aortic arch. No pneumothorax. Osteopenia. IMPRESSION: Stable cardiomegaly. Interstitial edema confluent in lung bases versus atelectasis. Small pleural effusions. Aortic Atherosclerosis (ICD10-I70.0). Electronically Signed   By: Awilda Metroourtnay  Bloomer M.D.   On: 06/21/2018 01:53   Dg Knee Complete 4 Views Left  Result Date: 06/21/2018 CLINICAL DATA:  Pain and swelling after fall, assess for fracture. EXAM: LEFT KNEE - COMPLETE 4+ VIEW COMPARISON:  None. FINDINGS: No acute fracture deformity or dislocation. Severe lateral, moderate patellofemoral medial compartment narrowing with periarticular sclerosis and marginal spurring. Osteopenia. No destructive bony lesions. Moderate vascular calcifications. IMPRESSION: 1. No acute fracture deformity or dislocation. 2. Tricompartmental osteoarthrosis, severe within lateral compartment. Electronically Signed   By: Awilda Metroourtnay  Bloomer M.D.   On: 06/21/2018 01:52    EKG: Normal sinus rhythm with first-degree AV block and lateral T wave inversion  Weights: Filed Weights   07/19/18 0704 07/19/18 1245 07/20/18 0532  Weight: 103 kg 99.2 kg 99.3 kg     Physical Exam: Blood pressure 137/71, pulse (!) 59, temperature 98.6 F (37 C), temperature source Oral, resp. rate 20, height 5\' 11"  (1.803 m), weight 99.3 kg, SpO2 98 %. Body mass index is 30.54 kg/m. General: Well developed, well nourished, in no acute distress. Head eyes ears nose throat: Normocephalic, atraumatic, sclera non-icteric, no xanthomas, nares are without discharge. No apparent thyromegaly and/or mass  Lungs: Normal respiratory effort.  Few wheezes, basilar rales, no rhonchi.  Heart: RRR with normal S1 S2. no murmur gallop, no rub, PMI is normal size and placement, carotid upstroke normal without bruit, jugular venous pressure is  normal Abdomen: Soft, non-tender, non-distended with normoactive bowel sounds. No hepatomegaly. No rebound/guarding. No obvious abdominal masses. Abdominal aorta is normal size without bruit Extremities: 1+ edema. no cyanosis, no clubbing, no ulcers  Peripheral : 2+ bilateral upper extremity pulses, 2+ bilateral femoral pulses, 1 + bilateral dorsal pedal pulse Neuro: Alert and oriented. No facial asymmetry. No focal deficit. Moves all extremities spontaneously. Musculoskeletal: Normal muscle tone without kyphosis Psych:  Responds to questions appropriately with a normal affect.    Assessment: 78 year old male with acute on chronic systolic dysfunction congestive heart failure cardiovascular disease hypertension hyperlipidemia having  recurrent episodes of edema shortness of breath and heart failure despite changes in diuretic use and no current evidence of infection ischemia structural functional issues of heart valve anemia or kidney dysfunction.  Patient currently has no evidence of myocardial infarction  Plan: 1.  Continue current medication management for congestive heart failure which is appropriate at this time and has been maximized 2.  Continue intravenous diuresis with Lasix and change back to torsemide 40 mg twice per day as before 3.  Nephrology consultation for further evaluation and treatment options of chronic kidney disease is related to congestive heart failure 4.  No further cardiac diagnostics necessary at this time 5.  Continue help with management with heart failure clinic  Signed, Lamar Blinks M.D. [facility] Cardiology 07/20/2018, 6:58 AM

## 2018-07-20 NOTE — Progress Notes (Signed)
*  PRELIMINARY RESULTS* Echocardiogram 2D Echocardiogram has been performed.  Cristela Blue 07/20/2018, 8:46 AM

## 2018-07-20 NOTE — Consult Note (Signed)
[facility]  CONSULT NOTE    Date: 07/20/2018                  Patient Name:  Larry Lawrence  MRN: [id number]  DOB: July 19, 1940  Age / Sex: 78 y.o., male         PCP: Duanne Limerick, MD                 Service Requesting Consult: Dr. Enid Baas                 Reason for Consult: Acute renal failure on chronic kidney disease stage III            History of Present Illness: Larry Lawrence is a 78 y.o. black male with congestive heart failure EF 25-30%, hypertension, gout, coronary artery disease, TIA, hyperlipidemia, who was admitted to [facility] on 07/19/2018 for Acute on chronic congestive heart failure, unspecified heart failure type (HCC) [I50.9]   Family at bedside. Daughter assists with history taking. Patient states he has been taking his torsemide daily and checking his weight daily. States he could not walk due to peripheral edema and shortness of breath.  Admitted and started on IV furosemide. Only of urine recorded.   Medications: Outpatient medications: Medications Prior to Admission  Medication Sig Dispense Refill Last Dose  . allopurinol (ZYLOPRIM) 300 MG tablet TAKE (1) TABLET BY MOUTH TWICE DAILY 60 tablet 1 07/18/2018 at 2100  . amiodarone (PACERONE) 200 MG tablet Take 200 mg by mouth daily.    07/18/2018 at 0900  . atorvastatin (LIPITOR) 20 MG tablet Take 20 mg by mouth daily.    07/18/2018 at 2100  . carvedilol (COREG) 25 MG tablet Take 25 mg by mouth 2 (two) times daily with a meal.    07/18/2018 at 1200  . hydrALAZINE (APRESOLINE) 25 MG tablet Take 1 tablet (25 mg total) by mouth every 8 (eight) hours. (Patient taking differently: Take 50 mg by mouth every 8 (eight) hours. )   07/18/2018 at 2100  . isosorbide mononitrate (IMDUR) 30 MG 24 hr tablet Take 30 mg by mouth daily.    07/18/2018 at 0900  . loratadine (CLARITIN) 10 MG tablet Take 1 tablet (10 mg total) by mouth daily. (Patient taking differently: Take 10 mg by mouth daily as needed for  allergies. ) 30 tablet 5 prn at prn  . mometasone (NASONEX) 50 MCG/ACT nasal spray Place 2 sprays into the nose daily. 17 g 12 prn at prn  . montelukast (SINGULAIR) 10 MG tablet TAKE ONE TABLET AT BEDTIME. (Patient taking differently: Take 10 mg by mouth at bedtime. ) 30 tablet 2 07/18/2018 at 2100  . potassium chloride SA (K-DUR,KLOR-CON) 20 MEQ tablet Take 1 tablet (20 mEq total) by mouth 3 (three) times daily. 90 tablet 5 07/18/2018 at 2100  . sacubitril-valsartan (ENTRESTO) 24-26 MG Take 1 tablet by mouth 2 (two) times daily.    07/18/2018 at 2100  . timolol (TIMOPTIC) 0.5 % ophthalmic solution Place 1 drop into both eyes 2 (two) times daily. Dr Inez Pilgrim   07/18/2018 at 2100  . torsemide (DEMADEX) 20 MG tablet Take 2 tablets (40 mg total) by mouth daily. 60 tablet 1 07/18/2018 at 0900    Current medications: Current Facility-Administered Medications  Medication Dose Route Frequency Provider Last Rate Last Dose  . amiodarone (PACERONE) tablet 200 mg  200 mg Oral Daily Gouru, Aruna, MD   200 mg at 07/20/18 0854  . atorvastatin (  LIPITOR) tablet 20 mg  20 mg Oral Daily Gouru, Aruna, MD   20 mg at 07/20/18 0855  . carvedilol (COREG) tablet 25 mg  25 mg Oral BID WC Gouru, Aruna, MD   25 mg at 07/20/18 0854  . fluticasone (FLONASE) 50 MCG/ACT nasal spray 1 spray  1 spray Each Nare Daily Gouru, Aruna, MD   1 spray at 07/20/18 0856  . furosemide (LASIX) injection 20 mg  20 mg Intravenous BID Gouru, Aruna, MD   20 mg at 07/20/18 0854  . heparin injection 5,000 Units  5,000 Units Subcutaneous Q8H Ojie, Jude, MD   5,000 Units at 07/20/18 1503  . hydrALAZINE (APRESOLINE) tablet 25 mg  25 mg Oral Q8H Gouru, Aruna, MD   25 mg at 07/20/18 1503  . isosorbide mononitrate (IMDUR) 24 hr tablet 30 mg  30 mg Oral Daily Gouru, Aruna, MD   30 mg at 07/20/18 0854  . loratadine (CLARITIN) tablet 10 mg  10 mg Oral Daily Gouru, Aruna, MD   10 mg at 07/20/18 0855  . montelukast (SINGULAIR) tablet 10 mg  10 mg Oral QHS  Gouru, Deanna Artis, MD   10 mg at 07/19/18 2121  . potassium chloride SA (K-DUR,KLOR-CON) CR tablet 20 mEq  20 mEq Oral Daily Gouru, Aruna, MD   20 mEq at 07/20/18 0855  . timolol (TIMOPTIC) 0.5 % ophthalmic solution 1 drop  1 drop Both Eyes BID Gouru, Aruna, MD   1 drop at 07/20/18 0856      Allergies: Allergies  Allergen Reactions  . Niaspan [Niacin Er] Other (See Comments)    Reaction:  Hot flashes       Past Medical History: Past Medical History:  Diagnosis Date  . Allergy   . CHF (congestive heart failure) (HCC)   . Coronary artery disease   . Gout   . Insomnia   . Renal disorder   . Sleeps in sitting position due to orthopnea   . TIA (transient ischemic attack)      Past Surgical History: Past Surgical History:  Procedure Laterality Date  . CARDIAC CATHETERIZATION N/A 02/02/2016   Procedure: Left Heart Cath and Coronary Angiography;  Surgeon: Lamar Blinks, MD;  Location: [facility] INVASIVE CV LAB;  Service: Cardiovascular;  Laterality: N/A;  . CARDIAC SURGERY     3 stents in "main artery"  . JOINT REPLACEMENT       Family History: Family History  Problem Relation Age of Onset  . Hypertension Mother   . Pancreatic cancer Mother   . Hypertension Father   . Prostate cancer Father      Social History: Social History   Socioeconomic History  . Marital status: Married    Spouse name: Not on file  . Number of children: 2  . Years of education: Not on file  . Highest education level: 12th grade  Occupational History  . Occupation: retired  Engineer, production  . Financial resource strain: Not hard at all  . Food insecurity:    Worry: Never true    Inability: Never true  . Transportation needs:    Medical: No    Non-medical: No  Tobacco Use  . Smoking status: Former Smoker    Packs/day: 2.00    Years: 3.00    Pack years: 6.00    Types: Cigarettes    Last attempt to quit: 1960    Years since quitting: 60.0  . Smokeless tobacco: Never Used  . Tobacco comment:  Smoking cessation materials not required  Substance and Sexual Activity  . Alcohol use: No  . Drug use: No  . Sexual activity: Not Currently  Lifestyle  . Physical activity:    Days per week: 0 days    Minutes per session: 0 min  . Stress: Not at all  Relationships  . Social connections:    Talks on phone: Twice a week    Gets together: Once a week    Attends religious service: More than 4 times per year    Active member of club or organization: No    Attends meetings of clubs or organizations: Never    Relationship status: Married  . Intimate partner violence:    Fear of current or ex partner: No    Emotionally abused: No    Physically abused: No    Forced sexual activity: No  Other Topics Concern  . Not on file  Social History Narrative  . Not on file     Review of Systems: Review of Systems  Constitutional: Positive for malaise/fatigue. Negative for chills, diaphoresis, fever and weight loss.  HENT: Negative.  Negative for congestion, ear discharge, ear pain, hearing loss, nosebleeds, sinus pain, sore throat and tinnitus.   Eyes: Negative.  Negative for blurred vision, double vision, photophobia, pain, discharge and redness.  Respiratory: Positive for shortness of breath and wheezing. Negative for cough, hemoptysis, sputum production and stridor.   Cardiovascular: Positive for orthopnea, leg swelling and PND. Negative for chest pain, palpitations and claudication.  Gastrointestinal: Negative.  Negative for abdominal pain, blood in stool, constipation, diarrhea, heartburn, melena, nausea and vomiting.  Genitourinary: Negative.  Negative for dysuria, flank pain, frequency, hematuria and urgency.  Musculoskeletal: Negative.  Negative for back pain, falls, joint pain, myalgias and neck pain.  Skin: Negative.  Negative for itching and rash.  Neurological: Negative.  Negative for dizziness, tingling, tremors, sensory change, speech change, focal weakness, seizures, loss of  consciousness, weakness and headaches.  Endo/Heme/Allergies: Negative.  Negative for environmental allergies and polydipsia. Does not bruise/bleed easily.  Psychiatric/Behavioral: Negative.  Negative for depression, hallucinations, memory loss, substance abuse and suicidal ideas. The patient is not nervous/anxious and does not have insomnia.     Vital Signs: Blood pressure 139/72, pulse (!) 58, temperature 98.7 F (37.1 C), temperature source Oral, resp. rate 20, height 5\' 11"  (1.803 m), weight 99.3 kg, SpO2 98 %.  Weight trends: Filed Weights   07/19/18 0704 07/19/18 1245 07/20/18 0532  Weight: 103 kg 99.2 kg 99.3 kg    Physical Exam: General: NAD, sitting up in bed  Head: Normocephalic, atraumatic. Moist oral mucosal membranes  Eyes: Anicteric, PERRL  Neck: Supple, trachea midline  Lungs:  Bilateral rales, breathing room air  Heart: Regular rate and rhythm  Abdomen:  Soft, nontender,   Extremities:  ++ peripheral edema.  Neurologic: Nonfocal, moving all four extremities  Skin: No lesions         Lab results: Basic Metabolic Panel: Recent Labs  Lab 07/19/18 0651 07/20/18 0457  NA 140 140  K 3.8 3.0*  CL 108 110  CO2 25 24  GLUCOSE 119* 107*  BUN 27* 31*  CREATININE 1.97* 1.95*  CALCIUM 8.3* 8.1*    Liver Function Tests: Recent Labs  Lab 07/19/18 0651  AST 23  ALT 11  ALKPHOS 130*  BILITOT 1.8*  PROT 6.6  ALBUMIN 3.4*   No results for input(s): LIPASE, AMYLASE in the last 168 hours. No results for input(s): AMMONIA in the last 168 hours.  CBC: Recent  Labs  Lab 07/19/18 0651  WBC 12.9*  NEUTROABS 10.9*  HGB 11.6*  HCT 37.5*  MCV 94.0  PLT 184    Cardiac Enzymes: Recent Labs  Lab 07/19/18 0651  TROPONINI <0.03    BNP: Invalid input(s): POCBNP  CBG: No results for input(s): GLUCAP in the last 168 hours.  Microbiology: No results found for this or any previous visit.  Coagulation Studies: No results for input(s): LABPROT, INR in the  last 72 hours.  Urinalysis: No results for input(s): COLORURINE, LABSPEC, PHURINE, GLUCOSEU, HGBUR, BILIRUBINUR, KETONESUR, PROTEINUR, UROBILINOGEN, NITRITE, LEUKOCYTESUR in the last 72 hours.  Invalid input(s): APPERANCEUR    Imaging: Dg Chest 2 View  Result Date: 07/19/2018 CLINICAL DATA:  Dyspnea EXAM: CHEST - 2 VIEW COMPARISON:  06/30/2018 chest radiograph. FINDINGS: Stable cardiomediastinal silhouette with mild cardiomegaly. No pneumothorax. Small bilateral pleural effusions. Hazy bibasilar lung opacities and mild pulmonary edema. IMPRESSION: 1. Mild congestive heart failure. 2. Small bilateral pleural effusions. 3. Hazy bibasilar lung opacities, nonspecific, probably atelectasis. Electronically Signed   By: Delbert PhenixJason A Poff M.D.   On: 07/19/2018 07:03      Assessment & Plan: Larry Lawrence is a 78 y.o. black male with congestive heart failure EF 25-30%, hypertension, gout, coronary artery disease, TIA, hyperlipidemia, who was [facility] on 07/19/2018 for Acute on chronic congestive heart failure, unspecified heart failure type  1. 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  Chronic kidney disease secondary to hypertension.  Acute renal failure secondary to acute cardiorenal syndrome.  Sherryll Burger- Entresto held - IV loop diuretics - check renal ultrasound - Check urine studies.   2. Hypertension with acute on chronic exacerbation of systolic congestive heart failure: EF 25-30%.  Significant peripheral edema on examination.  On carvedilol, hydralazine and isosorbide mononitrate.  - Loop diuretics: may need to increase dose and frequency.   3. Hypokalemia: secondary to loop diuretics - PO potassium replacement  LOS: 1 Emmilee Reamer 1/20/20203:17 PM

## 2018-07-20 NOTE — Evaluation (Signed)
Physical Therapy Evaluation Patient Details Name: Larry Lawrence MRN: [id number] DOB: [birth year] Today's Date: 07/20/2018   History of Present Illness  Patient is a 78 year old male admitted with SOB, found to have B pleural effusions. Patient has PMH to include: CHF, gout, CKD, TIA, B LE swelling, bradycardia.     Clinical Impression  Patient received in bed with family present. Patient pleasant, agrees to PT evaluation. Patient performs bed mobility without assistance, requires min guard for transfers, ambulated 80 feet with rw, no lob, slow cadence, min guard.  Patient will continue to benefit from skilled PT while at [facility] to improve strength and activity tolerance for return home.     Follow Up Recommendations No PT follow up    Equipment Recommendations       Recommendations for Other Services       Precautions / Restrictions Precautions Precautions: Fall Restrictions Weight Bearing Restrictions: No      Mobility  Bed Mobility Overal bed mobility: Independent                Transfers Overall transfer level: Modified independent Equipment used: Rolling walker (2 wheeled)                Ambulation/Gait Ambulation/Gait assistance: Modified independent (Device/Increase time);Min guard Gait Distance (Feet): 80 Feet Assistive device: Rolling walker (2 wheeled) Gait Pattern/deviations: Shuffle Gait velocity: decreased      Stairs            Wheelchair Mobility    Modified Rankin (Stroke Patients Only)       Balance Overall balance assessment: Modified Independent                                           Pertinent Vitals/Pain Pain Assessment: No/denies pain    Home Living Family/patient expects to be discharged to:: Private residence Living Arrangements: Other relatives Available Help at Discharge: Family Type of Home: House Home Access: Stairs to enter Entrance Stairs-Rails: Left;Right;Can reach both Water quality scientist of Steps: 5 Home Layout: One level Home Equipment: Shower seat - built in;Cane - single point;Walker - 2 wheels      Prior Function                 Hand Dominance        Extremity/Trunk Assessment   Upper Extremity Assessment Upper Extremity Assessment: Overall WFL for tasks assessed    Lower Extremity Assessment Lower Extremity Assessment: Overall WFL for tasks assessed;Generalized weakness       Communication   Communication: HOH  Cognition Arousal/Alertness: Awake/alert Behavior During Therapy: WFL for tasks assessed/performed Overall Cognitive Status: Within Functional Limits for tasks assessed                                        General Comments      Exercises     Assessment/Plan    PT Assessment Patient needs continued PT services  PT Problem List Decreased strength;Decreased mobility;Decreased activity tolerance;Cardiopulmonary status limiting activity       PT Treatment Interventions Functional mobility training;Balance training;Patient/family education;Gait training;Therapeutic activities;Therapeutic exercise;Stair training;Neuromuscular re-education    PT Goals (Current goals can be found in the Care Plan section)  Acute Rehab PT Goals Patient Stated Goal: to return home PT Goal Formulation: With  patient Time For Goal Achievement: 08/03/18 Potential to Achieve Goals: Good    Frequency Min 2X/week   Barriers to discharge        Co-evaluation               AM-PAC PT "6 Clicks" Mobility  Outcome Measure Help needed turning from your back to your side while in a flat bed without using bedrails?: None Help needed moving from lying on your back to sitting on the side of a flat bed without using bedrails?: None Help needed moving to and from a bed to a chair (including a wheelchair)?: A Little Help needed standing up from a chair using your arms (e.g., wheelchair or bedside chair)?: A Little Help needed  to walk in hospital room?: A Little Help needed climbing 3-5 steps with a railing? : A Little 6 Click Score: 20    End of Session Equipment Utilized During Treatment: Gait belt Activity Tolerance: Patient tolerated treatment well Patient left: in bed;with bed alarm set;with family/visitor present;with call bell/phone within reach Nurse Communication: Mobility status PT Visit Diagnosis: Muscle weakness (generalized) (M62.81)    Time: 1350-1410 PT Time Calculation (min) (ACUTE ONLY): 20 min   Charges:   PT Evaluation $PT Eval Low Complexity: 1 Low          Luellen Howson, PT, GCS 07/20/18,2:30 PM

## 2018-07-20 NOTE — Progress Notes (Signed)
[employer] [facility] [place] [facility]   PATIENT NAME: Larry Lawrence    MR#:  [id number]  DATE OF BIRTH:  04-21-1941  SUBJECTIVE:  CHIEF COMPLAINT:   Chief Complaint  Patient presents with  . Shortness of Breath   No new complaint this morning.  Patient reports gradual improvement in shortness of breath.  No chest pain.  Already seen by cardiologist this morning.  Daughter at bedside updated on treatment plans as outlined below  REVIEW OF SYSTEMS:  Review of Systems  Constitutional: Negative for chills and fever.  HENT: Negative for hearing loss and tinnitus.   Eyes: Negative for blurred vision and double vision.  Respiratory: Positive for shortness of breath. Negative for cough and hemoptysis.   Cardiovascular: Negative for chest pain and palpitations.  Gastrointestinal: Negative for heartburn and nausea.  Genitourinary: Negative for dysuria and urgency.  Musculoskeletal: Negative for myalgias.  Skin: Negative for rash.  Neurological: Negative for dizziness and headaches.  Psychiatric/Behavioral: Negative for depression and substance abuse.      DRUG ALLERGIES:   Allergies  Allergen Reactions  . Niaspan [Niacin Er] Other (See Comments)    Reaction:  Hot flashes    VITALS:  Blood pressure 139/72, pulse (!) 58, temperature 98.7 F (37.1 C), temperature source Oral, resp. rate 20, height 5\' 11"  (1.803 m), weight 99.3 kg, SpO2 98 %. PHYSICAL EXAMINATION:   Physical Exam  Constitutional: He is oriented to person, place, and time. He appears well-developed and well-nourished.  HENT:  Head: Normocephalic and atraumatic.  Eyes: Pupils are equal, round, and reactive to light. Conjunctivae and EOM are normal.  Neck: No tracheal deviation present. No thyromegaly present.  Cardiovascular: Normal rate, regular rhythm and normal heart sounds.  Respiratory: Effort normal. He has rales.  GI: Soft. Bowel sounds are normal. He exhibits no distension. There is no  abdominal tenderness.  Musculoskeletal: Normal range of motion.        General: Edema present.  Neurological: He is alert and oriented to person, place, and time.  Skin: Skin is warm. No erythema.  Psychiatric: He has a normal mood and affect. His behavior is normal.   LABORATORY PANEL:  Male CBC Recent Labs  Lab 07/19/18 0651  WBC 12.9*  HGB 11.6*  HCT 37.5*  PLT 184   ------------------------------------------------------------------------------------------------------------------ Chemistries  Recent Labs  Lab 07/19/18 0651 07/20/18 0457  NA 140 140  K 3.8 3.0*  CL 108 110  CO2 25 24  GLUCOSE 119* 107*  BUN 27* 31*  CREATININE 1.97* 1.95*  CALCIUM 8.3* 8.1*  AST 23  --   ALT 11  --   ALKPHOS 130*  --   BILITOT 1.8*  --    RADIOLOGY:  No results found. ASSESSMENT AND PLAN:    1. Acute on chronic CHF-systolic Being diuresed with IV Lasix.  Continue Coreg. Entresto currently on hold due to acute kidney injury on presentation. Seen by cardiologist.  Appreciate input.  He recommends continuing current medical management for CHF with IV Lasix.  Plans to transition back to torsemide 40 mg twice daily on discharge.No further cardiac diagnostics necessary per cardiologist at this time.  Troponin was negative.  No chest pains. Placed physical therapy consultation to ambulate patient and monitor for symptoms.  2. Acute kidney injury on chronic kidney disease Hold Entresto, while patient is on IV Lasix monitor renal function closely Cardiologist recommended nephrology consultation for further evaluation and treatment options of CKD related to CHF.  Consult for  nephrologist placed.  3.Essential hypertension Continue home medication Coreg, hydralazine, Imdur and hold Entresto  4. Gout Holding allopurinol for now Monitor clinically.  5. Allergic rhinitis continue Nasonex  6.  Hypokalemia; replaced.  Follow-up on repeat levels in a.m.  DVT prophylaxis; heparin   CODE STATUS: DNR     All the records are reviewed and case discussed with Care Management/Social Worker. Management plans discussed with the patient, family and they are in agreement.  CODE STATUS: DNR  TOTAL TIME TAKING CARE OF THIS PATIENT: 33 minutes.   More than 50% of the time was spent in counseling/coordination of care: YES  POSSIBLE D/C IN 1-2 DAYS, DEPENDING ON CLINICAL CONDITION.   Salvatore Poe M.D on 07/20/2018 at 11:55 AM  Between 7am to 6pm - Pager - [phone]  After 6pm go to [email or web address] - Social research officer, governmentpassword [email or web address] [email or web address]  [facility] [place] [facility]  Office  [phone]  CC: Primary care physician; Duanne LimerickJones, Deanna C, MD  Note: This dictation was prepared with Dragon dictation along with smaller phrase technology. Any transcriptional errors that result from this process are unintentional.

## 2018-07-21 ENCOUNTER — Inpatient Hospital Stay: Payer: Medicare Other

## 2018-07-21 ENCOUNTER — Ambulatory Visit: Payer: Medicare Other | Admitting: Family

## 2018-07-21 LAB — BASIC METABOLIC PANEL
Anion gap: 5 (ref 5–15)
BUN: 35 mg/dL — ABNORMAL HIGH (ref 8–23)
CALCIUM: 8.2 mg/dL — AB (ref 8.9–10.3)
CO2: 26 mmol/L (ref 22–32)
Chloride: 108 mmol/L (ref 98–111)
Creatinine, Ser: 2.07 mg/dL — ABNORMAL HIGH (ref 0.61–1.24)
GFR calc Af Amer: 35 mL/min — ABNORMAL LOW (ref >60)
GFR calc non Af Amer: 30 mL/min — ABNORMAL LOW (ref >60)
Glucose, Bld: 110 mg/dL — ABNORMAL HIGH (ref 70–99)
Potassium: 3.3 mmol/L — ABNORMAL LOW (ref 3.5–5.1)
Sodium: 139 mmol/L (ref 135–145)

## 2018-07-21 LAB — CBC
HCT: 32.9 % — ABNORMAL LOW (ref 39.0–52.0)
Hemoglobin: 10.3 g/dL — ABNORMAL LOW (ref 13.0–17.0)
MCH: 29.1 pg (ref 26.0–34.0)
MCHC: 31.3 g/dL (ref 30.0–36.0)
MCV: 92.9 fL (ref 80.0–100.0)
NRBC: 0 % (ref 0.0–0.2)
PLATELETS: 153 10*3/uL (ref 150–400)
RBC: 3.54 MIL/uL — ABNORMAL LOW (ref 4.22–5.81)
RDW: 15.9 % — ABNORMAL HIGH (ref 11.5–15.5)
WBC: 7.8 10*3/uL (ref 4.0–10.5)

## 2018-07-21 LAB — MAGNESIUM: Magnesium: 2.4 mg/dL (ref 1.7–2.4)

## 2018-07-21 MED ORDER — FUROSEMIDE 10 MG/ML IJ SOLN
80.0000 mg | Freq: Two times a day (BID) | INTRAMUSCULAR | Status: DC
  Administered 2018-07-21 – 2018-07-22 (×2): 80 mg via INTRAVENOUS
  Filled 2018-07-21 (×2): qty 8

## 2018-07-21 MED ORDER — POTASSIUM CHLORIDE CRYS ER 20 MEQ PO TBCR
20.0000 meq | EXTENDED_RELEASE_TABLET | Freq: Two times a day (BID) | ORAL | Status: DC
  Administered 2018-07-21 – 2018-07-22 (×2): 20 meq via ORAL
  Filled 2018-07-21 (×2): qty 1

## 2018-07-21 MED ORDER — FUROSEMIDE 10 MG/ML IJ SOLN
40.0000 mg | Freq: Once | INTRAMUSCULAR | Status: AC
  Administered 2018-07-21: 40 mg via INTRAVENOUS
  Filled 2018-07-21: qty 4

## 2018-07-21 NOTE — Progress Notes (Signed)
Physical Therapy Treatment Patient Details Name: Larry Lawrence MRN: [id number] DOB: [birth year] Today's Date: 07/21/2018    History of Present Illness Patient is a 78 year old male admitted with SOB, found to have B pleural effusions. Patient has PMH to include: CHF, gout, CKD, TIA, B LE swelling, bradycardia.     PT Comments    Patient received up in recliner, grandson present. Patient agrees to PT. No complaints this morning, LEs continue to be edemous. Patient able to increase gait distance significantly this visit. No complaints of fatigue or SOB while active. Patient ambulated 200 feet with rw and supervision. Slow cadence, decreased foot clearance bilaterally, decreased step length. Patient will benefit from continued skilled PT while here for improved strength and activity tolerance to return home.     Follow Up Recommendations  No PT follow up     Equipment Recommendations       Recommendations for Other Services       Precautions / Restrictions Precautions Precautions: Fall Restrictions Weight Bearing Restrictions: No    Mobility  Bed Mobility               General bed mobility comments: not assessed, patient in recliner and returned to recliner after walking  Transfers Overall transfer level: Modified independent Equipment used: Rolling walker (2 wheeled)                Ambulation/Gait Ambulation/Gait assistance: Modified independent (Device/Increase time);Supervision Gait Distance (Feet): 200 Feet Assistive device: Rolling walker (2 wheeled) Gait Pattern/deviations: Shuffle;Decreased step length - right;Decreased step length - left;Trunk flexed Gait velocity: decreased       Stairs             Wheelchair Mobility    Modified Rankin (Stroke Patients Only)       Balance Overall balance assessment: Modified Independent                                          Cognition Arousal/Alertness: Awake/alert Behavior  During Therapy: WFL for tasks assessed/performed Overall Cognitive Status: Within Functional Limits for tasks assessed                                        Exercises Total Joint Exercises Ankle Circles/Pumps: AROM;10 reps;Both;Seated    General Comments        Pertinent Vitals/Pain Pain Assessment: No/denies pain    Home Living Family/patient expects to be discharged to:: Private residence Living Arrangements: Other relatives Available Help at Discharge: Family Type of Home: House Home Access: Stairs to enter Entrance Stairs-Rails: Left;Right;Can reach both Home Layout: One level Home Equipment: Shower seat - built in;Cane - single point;Walker - 2 wheels      Prior Function            PT Goals (current goals can now be found in the care plan section) Acute Rehab PT Goals Patient Stated Goal: to return home PT Goal Formulation: With patient Time For Goal Achievement: 08/03/18 Potential to Achieve Goals: Good    Frequency    Min 2X/week      PT Plan      Co-evaluation              AM-PAC PT "6 Clicks" Mobility   Outcome Measure  Help needed turning from  your back to your side while in a flat bed without using bedrails?: None Help needed moving from lying on your back to sitting on the side of a flat bed without using bedrails?: None Help needed moving to and from a bed to a chair (including a wheelchair)?: A Little Help needed standing up from a chair using your arms (e.g., wheelchair or bedside chair)?: A Little Help needed to walk in hospital room?: A Little Help needed climbing 3-5 steps with a railing? : A Little 6 Click Score: 20    End of Session Equipment Utilized During Treatment: Gait belt Activity Tolerance: Patient tolerated treatment well Patient left: in chair;with chair alarm set;with family/visitor present Nurse Communication: Mobility status PT Visit Diagnosis: Muscle weakness (generalized) (M62.81)     Time:  1100-1115 PT Time Calculation (min) (ACUTE ONLY): 15 min  Charges:  $Gait Training: 8-22 mins                     [employer], PT, GCS 07/21/18,11:22 AM

## 2018-07-21 NOTE — Plan of Care (Signed)
°  Problem: Clinical Measurements: °Goal: Ability to maintain clinical measurements within normal limits will improve °Outcome: Progressing °  °Problem: Elimination: °Goal: Will not experience complications related to bowel motility °Outcome: Progressing °Goal: Will not experience complications related to urinary retention °Outcome: Progressing °  °

## 2018-07-21 NOTE — Progress Notes (Signed)
[facility] -  at [facility]   PATIENT NAME: Larry Lawrence    MR#:  [id number]  DATE OF BIRTH:  16-Dec-1940  SUBJECTIVE:  CHIEF COMPLAINT:   Chief Complaint  Patient presents with  . Shortness of Breath   No new complaint this morning.  Patient reports gradual improvement in shortness of breath and lower extremity edema. Seen by nephrologist this morning.  Dose of Lasix increased.  Renal ultrasound done.  Updated son present at bedside.  REVIEW OF SYSTEMS:  Review of Systems  Constitutional: Negative for chills and fever.  HENT: Negative for hearing loss and tinnitus.   Eyes: Negative for blurred vision and double vision.  Respiratory: Negative for cough, hemoptysis and shortness of breath.   Cardiovascular: Negative for chest pain and palpitations.  Gastrointestinal: Negative for heartburn and nausea.  Genitourinary: Negative for dysuria and urgency.  Musculoskeletal: Negative for myalgias.  Skin: Negative for rash.  Neurological: Negative for dizziness and headaches.  Psychiatric/Behavioral: Negative for depression and substance abuse.      DRUG ALLERGIES:   Allergies  Allergen Reactions  . Niaspan [Niacin Er] Other (See Comments)    Reaction:  Hot flashes    VITALS:  Blood pressure (!) 147/73, pulse (!) 57, temperature 98.3 F (36.8 C), temperature source Oral, resp. rate 18, height 5\' 11"  (1.803 m), weight 99.8 kg, SpO2 97 %. PHYSICAL EXAMINATION:   Physical Exam  Constitutional: He is oriented to person, place, and time. He appears well-developed and well-nourished.  HENT:  Head: Normocephalic and atraumatic.  Eyes: Pupils are equal, round, and reactive to light. Conjunctivae and EOM are normal.  Neck: No tracheal deviation present. No thyromegaly present.  Cardiovascular: Normal rate, regular rhythm and normal heart sounds.  Respiratory: Effort normal. He has rales.  GI: Soft. Bowel sounds are normal. He exhibits no distension. There  is no abdominal tenderness.  Musculoskeletal: Normal range of motion.        General: Edema present.  Neurological: He is alert and oriented to person, place, and time.  Skin: Skin is warm. No erythema.  Psychiatric: He has a normal mood and affect. His behavior is normal.   LABORATORY PANEL:  Male CBC Recent Labs  Lab 07/21/18 0546  WBC 7.8  HGB 10.3*  HCT 32.9*  PLT 153   ------------------------------------------------------------------------------------------------------------------ Chemistries  Recent Labs  Lab 07/19/18 0651  07/21/18 0546  NA 140   < > 139  K 3.8   < > 3.3*  CL 108   < > 108  CO2 25   < > 26  GLUCOSE 119*   < > 110*  BUN 27*   < > 35*  CREATININE 1.97*   < > 2.07*  CALCIUM 8.3*   < > 8.2*  MG  --   --  2.4  AST 23  --   --   ALT 11  --   --   ALKPHOS 130*  --   --   BILITOT 1.8*  --   --    < > = values in this interval not displayed.   RADIOLOGY:  US Renal  Result Date: 07/21/2018 CLINICAL DATA:  78 year old male with acute renal insufficiency. Initial encounter. EXAM: RENAL / URINARY TRACT ULTRASOUND COMPLETE COMPARISON:  11/05/2009 CT FINDINGS: Right Kidney: Renal measurements: 9.7 x 4.0 x 4.7 cm = volume: 94.8 mL. Minimal increased echogenicity. No mass or hydronephrosis visualized. Left Kidney: Renal measurements: 11.3 x 5.5 x 5.5 cm = volume: 177.7 mL.  Echogenicity within normal limits. Lobular contour. No hydronephrosis or mass noted. Bladder: Decompressed and not evaluated. IMPRESSION: 1. No hydronephrosis. 2. Minimal increased echogenicity right renal parenchyma. 3. Lobulated contour left kidney without mass identified. 4. Decompressed urinary bladder and therefore not evaluated. Electronically Signed   By: Lacy Duverney M.D.   On: 07/21/2018 09:42   ASSESSMENT AND PLAN:    1. Acute on chronic CHF-systolic Being diuresed with IV Lasix.  Seen by nephrologist again this morning and dose of Lasix increased to 80 mg IV every 12.  Continue  Coreg. Entresto currently on hold due to acute kidney injury on presentation. Seen by cardiologist.  Appreciate input.  He recommends continuing current medical management for CHF with IV Lasix.  Plans to transition back to torsemide 40 mg twice daily on discharge.No further cardiac diagnostics necessary per cardiologist at this time.  Troponin was negative.  No chest pains. Physical therapy ambulated with patient.  No further skilled physical therapy needs recommended.  2. Acute kidney injury on chronic kidney disease Hold Entresto, while patient is on IV Lasix monitor renal function closely Cardiologist recommended nephrology consultation for further evaluation and treatment options of CKD related to CHF. Being followed by nephrologist.  Renal ultrasound done with no hydronephrosis.  See report for further details.  3.Essential hypertension Continue home medication Coreg, hydralazine, Imdur and hold Entresto  4. Gout Holding allopurinol for now Monitor clinically.  5. Allergic rhinitis continue Nasonex  6.  Hypokalemia; replaced.  Follow-up on repeat levels in a.m.  DVT prophylaxis; heparin  CODE STATUS: DNR     All the records are reviewed and case discussed with Care Management/Social Worker. Management plans discussed with the patient, family and they are in agreement.  CODE STATUS: DNR  TOTAL TIME TAKING CARE OF THIS PATIENT: 32 minutes.   More than 50% of the time was spent in counseling/coordination of care: YES  POSSIBLE D/C IN 1-2 DAYS, DEPENDING ON CLINICAL CONDITION.   Cortne Amara M.D on 07/21/2018 at 12:29 PM  Between 7am to 6pm - Pager - [phone]  After 6pm go to [email or web address] - Social research officer, government  [facility] [place] [facility]  Office  [phone]  CC: Primary care physician; Duanne Limerick, MD  Note: This dictation was prepared with Dragon dictation along with smaller phrase technology. Any transcriptional errors that result from this  process are unintentional.

## 2018-07-21 NOTE — Progress Notes (Signed)
This RN responded to bed alarm this a.m. patient reports that he has voided in his depends.  Offered to change patient he refused and states that his family will bring him some of his brand, which he prefers. Patient output is a challenge to determine at this time as he has also been offered a condom cath and educated on importance of measuring urine output however he refuses condom cath as well. This RN will continue to educate family and monitor urine output as closely as possible.

## 2018-07-21 NOTE — Progress Notes (Signed)
Patient and grandson watched EMMI videos this evening, "Fluids and Fluid Pills," and "What is CHF?"

## 2018-07-21 NOTE — Progress Notes (Signed)
[facility]  ROUNDING NOTE   Subjective:   IV furosemide 40mg  q12 UOP 500  Patient has no complaints.   Objective:  Vital signs in last 24 hours:  Temp:  [98.3 F (36.8 C)-98.6 F (37 C)] 98.3 F (36.8 C) (01/21 0505) Pulse Rate:  [56-63] 57 (01/21 0829) Resp:  [18] 18 (01/21 0829) BP: (128-147)/(66-80) 147/73 (01/21 0829) SpO2:  [97 %-99 %] 97 % (01/21 0829) Weight:  [99.8 kg] 99.8 kg (01/21 0505)  Weight change: -3.169 kg Filed Weights   07/19/18 1245 07/20/18 0532 07/21/18 0505  Weight: 99.2 kg 99.3 kg 99.8 kg    Intake/Output: I/O last 3 completed shifts: In: 360 [P.O.:360] Out: 650 [Urine:650]   Intake/Output this shift:  Total I/O In: 3 [I.V.:3] Out: -   Physical Exam: General: NAD,   Head: Normocephalic, atraumatic. Moist oral mucosal membranes  Eyes: Anicteric, PERRL  Neck: Supple, trachea midline  Lungs:  Clear to auscultation  Heart: Regular rate and rhythm  Abdomen:  Soft, nontender,   Extremities: ++ peripheral edema.  Neurologic: Nonfocal, moving all four extremities  Skin: No lesions        Basic Metabolic Panel: Recent Labs  Lab 07/19/18 0651 07/20/18 0457 07/21/18 0546  NA 140 140 139  K 3.8 3.0* 3.3*  CL 108 110 108  CO2 25 24 26   GLUCOSE 119* 107* 110*  BUN 27* 31* 35*  CREATININE 1.97* 1.95* 2.07*  CALCIUM 8.3* 8.1* 8.2*  MG  --   --  2.4    Liver Function Tests: Recent Labs  Lab 07/19/18 0651  AST 23  ALT 11  ALKPHOS 130*  BILITOT 1.8*  PROT 6.6  ALBUMIN 3.4*   No results for input(s): LIPASE, AMYLASE in the last 168 hours. No results for input(s): AMMONIA in the last 168 hours.  CBC: Recent Labs  Lab 07/19/18 0651 07/21/18 0546  WBC 12.9* 7.8  NEUTROABS 10.9*  --   HGB 11.6* 10.3*  HCT 37.5* 32.9*  MCV 94.0 92.9  PLT 184 153    Cardiac Enzymes: Recent Labs  Lab 07/19/18 0651  TROPONINI <0.03    BNP: Invalid input(s): POCBNP  CBG: Recent Labs  Lab 07/20/18 2105  GLUCAP 128*     Microbiology: No results found for this or any previous visit.  Coagulation Studies: No results for input(s): LABPROT, INR in the last 72 hours.  Urinalysis: Recent Labs    07/20/18 1527  COLORURINE YELLOW*  LABSPEC 1.013  PHURINE 5.0  GLUCOSEU NEGATIVE  HGBUR MODERATE*  BILIRUBINUR NEGATIVE  KETONESUR NEGATIVE  PROTEINUR 30*  NITRITE NEGATIVE  LEUKOCYTESUR MODERATE*      Imaging: US Renal  Result Date: 07/21/2018 CLINICAL DATA:  78 year old male with acute renal insufficiency. Initial encounter. EXAM: RENAL / URINARY TRACT ULTRASOUND COMPLETE COMPARISON:  11/05/2009 CT FINDINGS: Right Kidney: Renal measurements: 9.7 x 4.0 x 4.7 cm = volume: 94.8 mL. Minimal increased echogenicity. No mass or hydronephrosis visualized. Left Kidney: Renal measurements: 11.3 x 5.5 x 5.5 cm = volume: 177.7 mL. Echogenicity within normal limits. Lobular contour. No hydronephrosis or mass noted. Bladder: Decompressed and not evaluated. IMPRESSION: 1. No hydronephrosis. 2. Minimal increased echogenicity right renal parenchyma. 3. Lobulated contour left kidney without mass identified. 4. Decompressed urinary bladder and therefore not evaluated. Electronically Signed   By: Lacy Duverney M.D.   On: 07/21/2018 09:42     Medications:    . amiodarone  200 mg Oral Daily  . atorvastatin  20 mg Oral Daily  .  carvedilol  25 mg Oral BID WC  . fluticasone  1 spray Each Nare Daily  . furosemide  40 mg Intravenous BID  . heparin injection (subcutaneous)  5,000 Units Subcutaneous Q8H  . hydrALAZINE  25 mg Oral Q8H  . isosorbide mononitrate  30 mg Oral Daily  . loratadine  10 mg Oral Daily  . montelukast  10 mg Oral QHS  . potassium chloride SA  20 mEq Oral Daily  . sodium chloride flush  3 mL Intravenous Q12H  . timolol  1 drop Both Eyes BID     Assessment/ Plan:  Mr. Deneise Leveraron D Hendershott is a 78 y.o. black malewith congestive heart failure EF 25-30%, hypertension, gout, coronary artery disease, TIA,  hyperlipidemia, who [facility]2018 for Acute on chronic congestive heart failure, unspecified heart failure type  1. 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  Chronic kidney disease secondary to hypertension.  Acute renal failure secondary to acute cardiorenal syndrome.  Renal ultrasound reviewed.  Sherryll Burger- Entresto held - IV loop diuretics: increase to fursoemide 80mg  IV q12  2. Hypertension with acute on chronic exacerbation of systolic congestive heart failure: EF 25-30%.  Significant peripheral edema on examination.  On carvedilol, hydralazine and isosorbide mononitrate.  - Loop diuretics: furosemide 80mg  IV q12  3. Hypokalemia: secondary to loop diuretics - PO potassium replacement   LOS: 2 Melody Cirrincione 1/21/202010:26 AM

## 2018-07-22 LAB — BASIC METABOLIC PANEL
Anion gap: 7 (ref 5–15)
BUN: 36 mg/dL — ABNORMAL HIGH (ref 8–23)
CALCIUM: 8.1 mg/dL — AB (ref 8.9–10.3)
CO2: 26 mmol/L (ref 22–32)
Chloride: 107 mmol/L (ref 98–111)
Creatinine, Ser: 1.84 mg/dL — ABNORMAL HIGH (ref 0.61–1.24)
GFR calc Af Amer: 41 mL/min — ABNORMAL LOW (ref >60)
GFR calc non Af Amer: 35 mL/min — ABNORMAL LOW (ref >60)
Glucose, Bld: 108 mg/dL — ABNORMAL HIGH (ref 70–99)
Potassium: 3 mmol/L — ABNORMAL LOW (ref 3.5–5.1)
Sodium: 140 mmol/L (ref 135–145)

## 2018-07-22 LAB — PROTEIN ELECTROPHORESIS, SERUM
A/G Ratio: 1.1 (ref 0.7–1.7)
ALPHA-1-GLOBULIN: 0.3 g/dL (ref 0.0–0.4)
Albumin ELP: 2.9 g/dL (ref 2.9–4.4)
Alpha-2-Globulin: 0.7 g/dL (ref 0.4–1.0)
Beta Globulin: 0.7 g/dL (ref 0.7–1.3)
Gamma Globulin: 1 g/dL (ref 0.4–1.8)
Globulin, Total: 2.7 g/dL (ref 2.2–3.9)
Total Protein ELP: 5.6 g/dL — ABNORMAL LOW (ref 6.0–8.5)

## 2018-07-22 LAB — MPO/PR-3 (ANCA) ANTIBODIES
ANCA Proteinase 3: 22.8 U/mL — ABNORMAL HIGH (ref 0.0–3.5)
Myeloperoxidase Abs: 69.3 U/mL — ABNORMAL HIGH (ref 0.0–9.0)

## 2018-07-22 LAB — ANA W/REFLEX: Anti Nuclear Antibody(ANA): NEGATIVE

## 2018-07-22 LAB — PROTEIN ELECTRO, RANDOM URINE
Albumin ELP, Urine: 41.6 %
Alpha-1-Globulin, U: 5.1 %
Alpha-2-Globulin, U: 11.9 %
Beta Globulin, U: 25.1 %
Gamma Globulin, U: 16.2 %
Total Protein, Urine: 51.3 mg/dL

## 2018-07-22 LAB — HEPATITIS B CORE ANTIBODY, IGM: Hep B C IgM: NEGATIVE

## 2018-07-22 LAB — HEPATITIS C ANTIBODY: HCV Ab: <0.1 s/co ratio (ref 0.0–0.9)

## 2018-07-22 LAB — MAGNESIUM: Magnesium: 2.2 mg/dL (ref 1.7–2.4)

## 2018-07-22 LAB — HIV ANTIBODY (ROUTINE TESTING W REFLEX): HIV SCREEN 4TH GENERATION: NONREACTIVE

## 2018-07-22 LAB — HEPATITIS B SURFACE ANTIGEN: Hepatitis B Surface Ag: NEGATIVE

## 2018-07-22 LAB — HEPATITIS B SURFACE ANTIBODY,QUALITATIVE: Hep B S Ab: NONREACTIVE

## 2018-07-22 MED ORDER — POTASSIUM CHLORIDE CRYS ER 20 MEQ PO TBCR: 20.0000 meq | EXTENDED_RELEASE_TABLET | Freq: Two times a day (BID) | ORAL | 0 refills | Status: DC

## 2018-07-22 MED ORDER — CARVEDILOL 12.5 MG PO TABS: 12.5000 mg | ORAL_TABLET | Freq: Two times a day (BID) | ORAL | 0 refills | Status: DC

## 2018-07-22 MED ORDER — FUROSEMIDE 40 MG PO TABS: 40.0000 mg | ORAL_TABLET | Freq: Two times a day (BID) | ORAL | 0 refills | Status: DC

## 2018-07-22 MED ORDER — POTASSIUM CHLORIDE CRYS ER 10 MEQ PO TBCR
30.0000 meq | EXTENDED_RELEASE_TABLET | Freq: Once | ORAL | Status: AC
  Administered 2018-07-22: 30 meq via ORAL
  Filled 2018-07-22: qty 3

## 2018-07-22 MED ORDER — ALLOPURINOL 300 MG PO TABS: 300.0000 mg | ORAL_TABLET | Freq: Every day | ORAL | 0 refills | Status: DC

## 2018-07-22 MED ORDER — HYDRALAZINE HCL 25 MG PO TABS: 50.0000 mg | ORAL_TABLET | Freq: Three times a day (TID) | ORAL | Status: DC

## 2018-07-22 NOTE — Progress Notes (Signed)
Larry Lawrence to be D/C'd Home per MD order.  Discussed prescriptions and follow up appointments with the patient. Prescriptions given to patient, medication list explained in detail. Pt verbalized understanding.  Allergies as of 07/22/2018      Reactions   Niaspan [niacin Er] Other (See Comments)   Reaction:  Hot flashes       Medication List    STOP taking these medications   ENTRESTO 24-26 MG Generic drug:  sacubitril-valsartan   torsemide 20 MG tablet Commonly known as:  DEMADEX     TAKE these medications   allopurinol 300 MG tablet Commonly known as:  ZYLOPRIM Take 1 tablet (300 mg total) by mouth daily. What changed:  See the new instructions.   amiodarone 200 MG tablet Commonly known as:  PACERONE Take 200 mg by mouth daily.   atorvastatin 20 MG tablet Commonly known as:  LIPITOR Take 20 mg by mouth daily.   carvedilol 12.5 MG tablet Commonly known as:  COREG Take 1 tablet (12.5 mg total) by mouth 2 (two) times daily for 30 days. What changed:    medication strength  how much to take  when to take this   furosemide 40 MG tablet Commonly known as:  LASIX Take 1 tablet (40 mg total) by mouth 2 (two) times daily for 30 days.   hydrALAZINE 25 MG tablet Commonly known as:  APRESOLINE Take 2 tablets (50 mg total) by mouth every 8 (eight) hours.   isosorbide mononitrate 30 MG 24 hr tablet Commonly known as:  IMDUR Take 30 mg by mouth daily.   loratadine 10 MG tablet Commonly known as:  CLARITIN Take 1 tablet (10 mg total) by mouth daily. What changed:    when to take this  reasons to take this   mometasone 50 MCG/ACT nasal spray Commonly known as:  NASONEX Place 2 sprays into the nose daily.   montelukast 10 MG tablet Commonly known as:  SINGULAIR TAKE ONE TABLET AT BEDTIME.   potassium chloride SA 20 MEQ tablet Commonly known as:  K-DUR,KLOR-CON Take 1 tablet (20 mEq total) by mouth 2 (two) times daily. What changed:  when to take this    timolol 0.5 % ophthalmic solution Commonly known as:  TIMOPTIC Place 1 drop into both eyes 2 (two) times daily. Dr Inez Pilgrim       Vitals:   07/22/18 0755 07/22/18 1335  BP: (!) 151/75 (!) 147/73  Pulse: (!) 55 (!) 54  Resp:  18  Temp: 98.1 F (36.7 C) 98.1 F (36.7 C)  SpO2: 98% 100%    Tele box removed and returned. Skin clean, dry and intact without evidence of skin break down, no evidence of skin tears noted. IV catheter discontinued intact. Site without signs and symptoms of complications. Dressing and pressure applied. Pt denies pain at this time. No complaints noted.  An After Visit Summary was printed and given to the patient. Patient escorted via WC, and D/C home via private auto.  [name]

## 2018-07-22 NOTE — Discharge Summary (Signed)
[facility] [place] [facility]   PATIENT NAME: Larry Lawrence    MR#:  [id number]  DATE OF BIRTH:  11/27/1940  DATE OF ADMISSION:  07/19/2018   ADMITTING PHYSICIAN: Ramonita LabAruna Gouru, MD  DATE OF DISCHARGE: 07/22/2018  PRIMARY CARE PHYSICIAN: Duanne LimerickJones, Deanna C, MD   ADMISSION DIAGNOSIS:  Acute on chronic congestive heart failure, unspecified heart failure type (HCC) [I50.9] DISCHARGE DIAGNOSIS:  Active Problems:   Acute on chronic heart failure (HCC)  SECONDARY DIAGNOSIS:   Past Medical History:  Diagnosis Date  . Allergy   . CHF (congestive heart failure) (HCC)   . Coronary artery disease   . Gout   . Insomnia   . Renal disorder   . Sleeps in sitting position due to orthopnea   . TIA (transient ischemic attack)    HOSPITAL COURSE:  Chief complaint; shortness of breath   HPI Larry Lawrence  is a 78 y.o. male with a known history of chronic systolic congestive heart failure, coronary artery disease, gout, allergic rhinitis and other medical problems who presented to the ED with a chief complaint of shortness of breath for the past 1 day.  He said shortness of breath is getting worse with minimal exertion but denies any chest pain.  Please medication was changed from Lasix to torsemide and patient was just seen by Kovalski last week and still having worsening shortness of breath and lower extremity edema..  No fever no cough son at bedside.  HOSPITAL COURSE 1. Acute on chronic CHF-systolic Patient was adequately diuresed with IV Lasix 80 mg every 12 hourly during this admission.  Appreciate input from nephrologist.  Discussed case with both nephrologist and cardiologist.  Given patient responded better to Lasix instead of torsemide, recommendation was to discharge on Lasix 40 mg p.o. twice daily.  Continue potassium supplementation.  Continue Coreg.  Entresto held due to acute kidney injury during this admission.  Follow-up with cardiologist and nephrologist as  outpatient.  Acute coronary syndrome ruled out.  Patient with no chest pain.Physical therapy ambulated with patient.  No further skilled physical therapy needs recommended.  2. Acute kidney injury on chronic kidney disease stage III Entresto held due to recent acute kidney injury.  Renal function appears to have improved slightly with ongoing diuresis with Lasix.  Follow-up with primary care physician as well as nephrologist for ongoing monitoring of renal function. Renal ultrasound done with no hydronephrosis.  See report for further details.  3.Essential hypertension Continue home medication Coreg, hydralazine, Imdur and hold Entresto  4. Gout Allopurinol 300 mg p.o. twice daily was held due to recent worsening of renal function.  Decreased frequency of allopurinol to daily on discharge.    5. Allergic rhinitis continue Nasonex  6.  Hypokalemia; replaced prior to discharge.  Continue potassium supplementation since patient is on Lasix  Disposition; patient clinically and hemodynamically stable.  Plans for discharge home today.  Follow-up with primary care physician , nephrologist and cardiologist  DISCHARGE CONDITIONS:  Stable CONSULTS OBTAINED:  Treatment Team:  [name], [name], MD [name], [name], MD DRUG ALLERGIES:   Allergies  Allergen Reactions  . Niaspan [Niacin Er] Other (See Comments)    Reaction:  Hot flashes    DISCHARGE MEDICATIONS:   Allergies as of 07/22/2018      Reactions   Niaspan [niacin Er] Other (See Comments)   Reaction:  Hot flashes       Medication List    STOP taking these medications   ENTRESTO  24-26 MG Generic drug:  sacubitril-valsartan   torsemide 20 MG tablet Commonly known as:  DEMADEX     TAKE these medications   allopurinol 300 MG tablet Commonly known as:  ZYLOPRIM Take 1 tablet (300 mg total) by mouth daily. What changed:  See the new instructions.   amiodarone 200 MG tablet Commonly known as:  PACERONE Take 200 mg by  mouth daily.   atorvastatin 20 MG tablet Commonly known as:  LIPITOR Take 20 mg by mouth daily.   carvedilol 12.5 MG tablet Commonly known as:  COREG Take 1 tablet (12.5 mg total) by mouth 2 (two) times daily for 30 days. What changed:   medication strength how much to take when to take this   furosemide 40 MG tablet Commonly known as:  LASIX Take 1 tablet (40 mg total) by mouth 2 (two) times daily for 30 days.   hydrALAZINE 25 MG tablet Commonly known as:  APRESOLINE Take 2 tablets (50 mg total) by mouth every 8 (eight) hours.   isosorbide mononitrate 30 MG 24 hr tablet Commonly known as:  IMDUR Take 30 mg by mouth daily.   loratadine 10 MG tablet Commonly known as:  CLARITIN Take 1 tablet (10 mg total) by mouth daily. What changed:   when to take this reasons to take this   mometasone 50 MCG/ACT nasal spray Commonly known as:  NASONEX Place 2 sprays into the nose daily.   montelukast 10 MG tablet Commonly known as:  SINGULAIR TAKE ONE TABLET AT BEDTIME.   potassium chloride SA 20 MEQ tablet Commonly known as:  K-DUR,KLOR-CON Take 1 tablet (20 mEq total) by mouth 2 (two) times daily. What changed:  when to take this   timolol 0.5 % ophthalmic solution Commonly known as:  TIMOPTIC Place 1 drop into both eyes 2 (two) times daily. Dr Inez PilgrimBrasington        DISCHARGE INSTRUCTIONS:   DIET:  Cardiac diet DISCHARGE CONDITION:  Stable ACTIVITY:  Activity as tolerated OXYGEN:  Home Oxygen: No.  Oxygen Delivery: room air DISCHARGE LOCATION:  home   If you experience worsening of your admission symptoms, develop shortness of breath, life threatening emergency, suicidal or homicidal thoughts you must seek medical attention immediately by calling 911 or calling your MD immediately  if symptoms less severe.  You Must read complete instructions/literature along with all the possible adverse reactions/side effects for all the Medicines you take and that have been  prescribed to you. Take any new Medicines after you have completely understood and accpet all the possible adverse reactions/side effects.   Please note  You were cared for by a hospitalist during your hospital stay. If you have any questions about your discharge medications or the care you received while you were in the hospital after you are discharged, you can call the unit and asked to speak with the hospitalist on call if the hospitalist that took care of you is not available. Once you are discharged, your primary care physician will handle any further medical issues. Please note that NO REFILLS for any discharge medications will be authorized once you are discharged, as it is imperative that you return to your primary care physician (or establish a relationship with a primary care physician if you do not have one) for your aftercare needs so that they can reassess your need for medications and monitor your lab values.    On the day of Discharge:  VITAL SIGNS:  Blood pressure (!) 151/75, pulse (!) 55,  temperature 98.1 F (36.7 C), temperature source Oral, resp. rate 20, height 5\' 11"  (1.803 m), weight 97.5 kg, SpO2 98 %. PHYSICAL EXAMINATION:  Constitutional: He is oriented to person, place, and time. He appears well-developed and well-nourished.  HENT:  Head: Normocephalic and atraumatic.  Eyes: Pupils are equal, round, and reactive to light. Conjunctivae and EOM are normal.  Neck: No tracheal deviation present. No thyromegaly present.  Cardiovascular: Normal rate, regular rhythm and normal heart sounds.  Respiratory: Effort normal.  Clear to auscultation bilaterally.  GI: Soft. Bowel sounds are normal. He exhibits no distension. There is no abdominal tenderness.  Musculoskeletal: Normal range of motion.        General: Edema  improved Neurological: He is alert and oriented to person, place, and time.  Skin: Skin is warm. No erythema.  Psychiatric: He has a normal mood and affect. His  behavior is normal.   DATA REVIEW:   CBC Recent Labs  Lab 07/21/18 0546  WBC 7.8  HGB 10.3*  HCT 32.9*  PLT 153    Chemistries  Recent Labs  Lab 07/19/18 0651  07/22/18 0432  NA 140   < > 140  K 3.8   < > 3.0*  CL 108   < > 107  CO2 25   < > 26  GLUCOSE 119*   < > 108*  BUN 27*   < > 36*  CREATININE 1.97*   < > 1.84*  CALCIUM 8.3*   < > 8.1*  MG  --    < > 2.2  AST 23  --   --   ALT 11  --   --   ALKPHOS 130*  --   --   BILITOT 1.8*  --   --    < > = values in this interval not displayed.     Microbiology Results  No results found for this or any previous visit.  RADIOLOGY:  No results found.   Management plans discussed with the patient, family and they are in agreement.  CODE STATUS: DNR   TOTAL TIME TAKING CARE OF THIS PATIENT: 36 minutes.    Cartez Mogle M.D on 07/22/2018 at 10:29 AM  Between 7am to 6pm - Pager - [phone]  After 6pm go to [email or web address] - Social research officer, government  [facility] [place] [facility]  Office  [phone]  CC: Primary care physician; Duanne Limerick, MD   Note: This dictation was prepared with Dragon dictation along with smaller phrase technology. Any transcriptional errors that result from this process are unintentional.

## 2018-07-22 NOTE — Progress Notes (Signed)
[facility]  ROUNDING NOTE   Subjective:   UOP  Furosemide 80mg  IV q12  Objective:  Vital signs in last 24 hours:  Temp:  [97.6 F (36.4 C)-98.1 F (36.7 C)] 98.1 F (36.7 C) (01/22 0755) Pulse Rate:  [52-57] 55 (01/22 0755) Resp:  [20] 20 (01/22 0440) BP: (132-151)/(68-78) 151/75 (01/22 0755) SpO2:  [97 %-100 %] 98 % (01/22 0755) Weight:  [97.5 kg] 97.5 kg (01/22 0440)  Weight change: -2.268 kg Filed Weights   07/20/18 0532 07/21/18 0505 07/22/18 0440  Weight: 99.3 kg 99.8 kg 97.5 kg    Intake/Output: I/O last 3 completed shifts: In: 3 [I.V.:3] Out: 3600 [Urine:3600]   Intake/Output this shift:  Total I/O In: 360 [P.O.:360] Out: 0   Physical Exam: General: NAD,   Head: Normocephalic, atraumatic. Moist oral mucosal membranes  Eyes: Anicteric, PERRL  Neck: Supple, trachea midline  Lungs:  Clear to auscultation  Heart: Regular rate and rhythm  Abdomen:  Soft, nontender,   Extremities: + peripheral edema.  Neurologic: Nonfocal, moving all four extremities  Skin: No lesions        Basic Metabolic Panel: Recent Labs  Lab 07/19/18 0651 07/20/18 0457 07/21/18 0546 07/22/18 0432  NA 140 140 139 140  K 3.8 3.0* 3.3* 3.0*  CL 108 110 108 107  CO2 25 24 26 26   GLUCOSE 119* 107* 110* 108*  BUN 27* 31* 35* 36*  CREATININE 1.97* 1.95* 2.07* 1.84*  CALCIUM 8.3* 8.1* 8.2* 8.1*  MG  --   --  2.4 2.2    Liver Function Tests: Recent Labs  Lab 07/19/18 0651  AST 23  ALT 11  ALKPHOS 130*  BILITOT 1.8*  PROT 6.6  ALBUMIN 3.4*   No results for input(s): LIPASE, AMYLASE in the last 168 hours. No results for input(s): AMMONIA in the last 168 hours.  CBC: Recent Labs  Lab 07/19/18 0651 07/21/18 0546  WBC 12.9* 7.8  NEUTROABS 10.9*  --   HGB 11.6* 10.3*  HCT 37.5* 32.9*  MCV 94.0 92.9  PLT 184 153    Cardiac Enzymes: Recent Labs  Lab 07/19/18 0651  TROPONINI <0.03    BNP: Invalid input(s): POCBNP  CBG: Recent Labs   Lab 07/20/18 2105  GLUCAP 128*    Microbiology: No results found for this or any previous visit.  Coagulation Studies: No results for input(s): LABPROT, INR in the last 72 hours.  Urinalysis: Recent Labs    07/20/18 1527  COLORURINE YELLOW*  LABSPEC 1.013  PHURINE 5.0  GLUCOSEU NEGATIVE  HGBUR MODERATE*  BILIRUBINUR NEGATIVE  KETONESUR NEGATIVE  PROTEINUR 30*  NITRITE NEGATIVE  LEUKOCYTESUR MODERATE*      Imaging: US Renal  Result Date: 07/21/2018 CLINICAL DATA:  78 year old male with acute renal insufficiency. Initial encounter. EXAM: RENAL / URINARY TRACT ULTRASOUND COMPLETE COMPARISON:  11/05/2009 CT FINDINGS: Right Kidney: Renal measurements: 9.7 x 4.0 x 4.7 cm = volume: 94.8 mL. Minimal increased echogenicity. No mass or hydronephrosis visualized. Left Kidney: Renal measurements: 11.3 x 5.5 x 5.5 cm = volume: 177.7 mL. Echogenicity within normal limits. Lobular contour. No hydronephrosis or mass noted. Bladder: Decompressed and not evaluated. IMPRESSION: 1. No hydronephrosis. 2. Minimal increased echogenicity right renal parenchyma. 3. Lobulated contour left kidney without mass identified. 4. Decompressed urinary bladder and therefore not evaluated. Electronically Signed   By: Lacy Duverney M.D.   On: 07/21/2018 09:42     Medications:    . amiodarone  200 mg Oral Daily  .  atorvastatin  20 mg Oral Daily  . carvedilol  25 mg Oral BID WC  . fluticasone  1 spray Each Nare Daily  . furosemide  80 mg Intravenous BID  . heparin injection (subcutaneous)  5,000 Units Subcutaneous Q8H  . hydrALAZINE  25 mg Oral Q8H  . isosorbide mononitrate  30 mg Oral Daily  . loratadine  10 mg Oral Daily  . montelukast  10 mg Oral QHS  . potassium chloride SA  20 mEq Oral BID  . sodium chloride flush  3 mL Intravenous Q12H  . timolol  1 drop Both Eyes BID     Assessment/ Plan:  [name] is a 78 y.o. black malewith congestive heart failure EF 25-30%, hypertension, gout,  coronary artery disease, TIA, hyperlipidemia, who was [facility] on 07/19/2018 for Acute on chronic congestive heart failure, unspecified heart failure type  1. 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  Chronic kidney disease secondary to hypertension.  Acute renal failure secondary to acute cardiorenal syndrome.  Renal ultrasound reviewed.  Sherryll Burger- Entresto held - Transition to PO furosemide 40mg  bid.   2. Hypertension with acute on chronic exacerbation of systolic congestive heart failure: EF 25-30%.  Significant peripheral edema on examination.  On carvedilol, hydralazine and isosorbide mononitrate.  - furosemide as above.   3. Hypokalemia: secondary to loop diuretics - PO potassium replacement   LOS: 3 Larry Lawrence 1/22/202011:50 AM

## 2018-07-24 ENCOUNTER — Telehealth: Payer: Self-pay

## 2018-07-24 NOTE — Telephone Encounter (Signed)
Attempted to reach patient for TCM call. Mobile phone unable to leave message. Home phone apartment complex.   Please notify me if patient contacts the office to complete call.

## 2018-07-26 NOTE — Progress Notes (Deleted)
   Patient ID: Larry Lawrence, male    DOB: 04/07/1941, 77 y.o.   MRN: [id number]  HPI  Larry Lawrence is a 77 y/o male with a history of  Echo report from 07/20/2018 reviewed and showed an EF of 25-30% along with mild AR and moderate Larry/TR.   Catheterization done 02/02/16 showed an EF of 30% along with:  RPDA lesion, 55 %stenosed.  Prox RCA to Mid RCA lesion, 30 %stenosed.  Mid LAD to Dist LAD lesion, 20 %stenosed.  Prox LAD to Mid LAD lesion, 30 %stenosed.  Ramus-2 lesion, 40 %stenosed.  Ramus-1 lesion, 35 %stenosed.  Mid Cx to Dist Cx lesion, 40 %stenosed.  abnormal left ventricular function with ejection fraction of 30% and global dysfunction moderate 3 vessel coronary artery disease with patent lad stent There is not significant stenosis of left anterior descending, lcx , and or rca  Admitted 07/19/2018 due to acute on chronic HF. Cardiology and nephrology consults obtained. Initially given IV lasix and then transitioned to oral diuretics. Entresto had to be held due to acute kidney injury. Acute coronary syndrome ruled out. Discharged after 3 days.   He presents today for his initial visit with a chief complaint of    Review of Systems    Physical Exam  Assessment & Plan:  1: Chronic heart failure with reduced ejection fraction- - NYHA class - BNP 07/19/2018 was 3561.0  2: Chronic kidney disease- - BMP from 07/22/2018 reviewed and showed sodium 140, potassium 3.0, creatinine 1.84 and GFR 35  

## 2018-07-28 ENCOUNTER — Ambulatory Visit: Payer: Medicare Other | Admitting: Family

## 2018-07-31 ENCOUNTER — Inpatient Hospital Stay: Payer: Medicare Other | Admitting: Family Medicine

## 2018-07-31 NOTE — Telephone Encounter (Signed)
Second attempt to reach patient for TCM call and confirm hospital follow up appt scheduled for today at 3:00. Unable to leave message.

## 2018-08-03 DIAGNOSIS — I5023 Acute on chronic systolic (congestive) heart failure: Secondary | ICD-10-CM | POA: Diagnosis not present

## 2018-08-03 DIAGNOSIS — I10 Essential (primary) hypertension: Secondary | ICD-10-CM | POA: Diagnosis not present

## 2018-08-03 DIAGNOSIS — I6523 Occlusion and stenosis of bilateral carotid arteries: Secondary | ICD-10-CM | POA: Diagnosis not present

## 2018-08-03 DIAGNOSIS — I2510 Atherosclerotic heart disease of native coronary artery without angina pectoris: Secondary | ICD-10-CM | POA: Diagnosis not present

## 2018-08-03 NOTE — Progress Notes (Deleted)
   Patient ID: Larry Lawrence, male    DOB: 1940/09/09, 77 y.o.   MRN: [id number]  HPI  Larry Lawrence is a 78 y/o male with a history of  Echo report from 07/20/2018 reviewed and showed an EF of 25-30% along with mild AR and moderate Larry/TR.   Catheterization done 02/02/16 showed an EF of 30% along with:  RPDA lesion, 55 %stenosed.  Prox RCA to Mid RCA lesion, 30 %stenosed.  Mid LAD to Dist LAD lesion, 20 %stenosed.  Prox LAD to Mid LAD lesion, 30 %stenosed.  Ramus-2 lesion, 40 %stenosed.  Ramus-1 lesion, 35 %stenosed.  Mid Cx to Dist Cx lesion, 40 %stenosed.  abnormal left ventricular function with ejection fraction of 30% and global dysfunction moderate 3 vessel coronary artery disease with patent lad stent There is not significant stenosis of left anterior descending, lcx , and or rca  Admitted 07/19/2018 due to acute on chronic HF. Cardiology and nephrology consults obtained. Initially given IV lasix and then transitioned to oral diuretics. Entresto had to be held due to acute kidney injury. Acute coronary syndrome ruled out. Discharged after 3 days.   He presents today for his initial visit with a chief complaint of    Review of Systems    Physical Exam  Assessment & Plan:  1: Chronic heart failure with reduced ejection fraction- - NYHA class - BNP 07/19/2018 was 3561.0  2: Chronic kidney disease- - BMP from 07/22/2018 reviewed and showed sodium 140, potassium 3.0, creatinine 1.84 and GFR 35

## 2018-08-04 ENCOUNTER — Ambulatory Visit: Payer: Medicare Other | Admitting: Family

## 2018-08-10 ENCOUNTER — Telehealth: Payer: Self-pay | Admitting: Family Medicine

## 2018-08-10 NOTE — Telephone Encounter (Signed)
Called to schedule Medicare Annual Wellness Visit with the Nurse Health Advisor.  No answer and couldn't leave a message at home/mobile number.  Tried to reach Daughter, Ulric Usey at 11:52 AM, No answer, left message to call Trixie Rude at [phone],  Jamal Maes to reach Ginger Carne at 11:54, No answer, left message to call Trixie Rude at [phone].  If patient returns call, please note: their last AWV was on 06/25/2017 please schedule AWV with [facility] any date AFTER 06/25/2018.  Thank you! For any questions please contact: Trixie Rude at [phone] or Skype [email or web address]

## 2018-08-13 ENCOUNTER — Other Ambulatory Visit: Payer: Self-pay | Admitting: Family Medicine

## 2018-08-13 DIAGNOSIS — J302 Other seasonal allergic rhinitis: Secondary | ICD-10-CM

## 2018-08-13 DIAGNOSIS — J309 Allergic rhinitis, unspecified: Secondary | ICD-10-CM

## 2018-08-13 NOTE — Telephone Encounter (Signed)
Daughter, Tammy called and scheduled her Dad's Author Sl[place]), Medicare Annual Wellness Visit for 08/24/2018 at 8:00 AM.

## 2018-08-13 NOTE — Telephone Encounter (Signed)
Daughter Tammy called and scheduled her father, Larry Lawrence, Medicare Annual Wellness Visit for 08/24/2018 at 8:00 AM.

## 2018-08-20 DIAGNOSIS — I129 Hypertensive chronic kidney disease with stage 1 through stage 4 chronic kidney disease, or unspecified chronic kidney disease: Secondary | ICD-10-CM | POA: Diagnosis not present

## 2018-08-20 DIAGNOSIS — N183 Chronic kidney disease, stage 3 (moderate): Secondary | ICD-10-CM | POA: Diagnosis not present

## 2018-08-20 DIAGNOSIS — I509 Heart failure, unspecified: Secondary | ICD-10-CM | POA: Diagnosis not present

## 2018-08-20 DIAGNOSIS — N179 Acute kidney failure, unspecified: Secondary | ICD-10-CM | POA: Diagnosis not present

## 2018-08-20 DIAGNOSIS — R809 Proteinuria, unspecified: Secondary | ICD-10-CM | POA: Diagnosis not present

## 2018-08-24 ENCOUNTER — Encounter: Payer: Self-pay | Admitting: Pharmacist

## 2018-08-24 ENCOUNTER — Other Ambulatory Visit: Payer: Self-pay | Admitting: Family Medicine

## 2018-08-24 ENCOUNTER — Ambulatory Visit: Payer: Medicare Other

## 2018-08-24 ENCOUNTER — Encounter: Payer: Self-pay | Admitting: Family

## 2018-08-24 ENCOUNTER — Ambulatory Visit: Payer: Medicare Other | Attending: Family | Admitting: Family

## 2018-08-24 VITALS — BP 131/71 | HR 57 | Resp 18 | Ht 71.0 in | Wt 219.5 lb

## 2018-08-24 DIAGNOSIS — Z7901 Long term (current) use of anticoagulants: Secondary | ICD-10-CM | POA: Insufficient documentation

## 2018-08-24 DIAGNOSIS — Z8673 Personal history of transient ischemic attack (TIA), and cerebral infarction without residual deficits: Secondary | ICD-10-CM | POA: Diagnosis not present

## 2018-08-24 DIAGNOSIS — I2510 Atherosclerotic heart disease of native coronary artery without angina pectoris: Secondary | ICD-10-CM | POA: Diagnosis not present

## 2018-08-24 DIAGNOSIS — I5022 Chronic systolic (congestive) heart failure: Secondary | ICD-10-CM | POA: Diagnosis not present

## 2018-08-24 DIAGNOSIS — M109 Gout, unspecified: Secondary | ICD-10-CM | POA: Insufficient documentation

## 2018-08-24 DIAGNOSIS — Z87891 Personal history of nicotine dependence: Secondary | ICD-10-CM | POA: Insufficient documentation

## 2018-08-24 DIAGNOSIS — Z955 Presence of coronary angioplasty implant and graft: Secondary | ICD-10-CM | POA: Insufficient documentation

## 2018-08-24 DIAGNOSIS — Z79899 Other long term (current) drug therapy: Secondary | ICD-10-CM | POA: Diagnosis not present

## 2018-08-24 DIAGNOSIS — R002 Palpitations: Secondary | ICD-10-CM | POA: Diagnosis not present

## 2018-08-24 DIAGNOSIS — N189 Chronic kidney disease, unspecified: Secondary | ICD-10-CM | POA: Diagnosis not present

## 2018-08-24 DIAGNOSIS — I890 Lymphedema, not elsewhere classified: Secondary | ICD-10-CM

## 2018-08-24 DIAGNOSIS — R0602 Shortness of breath: Secondary | ICD-10-CM | POA: Insufficient documentation

## 2018-08-24 DIAGNOSIS — Z8249 Family history of ischemic heart disease and other diseases of the circulatory system: Secondary | ICD-10-CM | POA: Insufficient documentation

## 2018-08-24 DIAGNOSIS — N183 Chronic kidney disease, stage 3 unspecified: Secondary | ICD-10-CM

## 2018-08-24 DIAGNOSIS — R05 Cough: Secondary | ICD-10-CM | POA: Insufficient documentation

## 2018-08-24 NOTE — Patient Instructions (Signed)
Continue weighing daily and call for an overnight weight gain of > 2 pounds or a weekly weight gain of >5 pounds. 

## 2018-08-24 NOTE — Progress Notes (Signed)
Patient ID: Larry Lawrence, male    DOB: Jan 19, 1941, 78 y.o.   MRN: [id number]  HPI  Larry Lawrence is a 78 y/o male with a history of CAD, CKD, gout, TIA, remote tobacco use and chronic heart failure.   Echo report from 07/20/2018 reviewed and showed an EF of 25-30% along with mild AR and moderate Larry/TR.   Catheterization done 02/02/16 showed an EF of 30% along with:  RPDA lesion, 55 %stenosed.  Prox RCA to Mid RCA lesion, 30 %stenosed.  Mid LAD to Dist LAD lesion, 20 %stenosed.  Prox LAD to Mid LAD lesion, 30 %stenosed.  Ramus-2 lesion, 40 %stenosed.  Ramus-1 lesion, 35 %stenosed.  Mid Cx to Dist Cx lesion, 40 %stenosed.  abnormal left ventricular function with ejection fraction of 30% and global dysfunction moderate 3 vessel coronary artery disease with patent lad stent There is not significant stenosis of left anterior descending, lcx , and or rca  Admitted 07/19/2018 due to acute on chronic HF. Cardiology and nephrology consults obtained. Initially given IV lasix and then transitioned to oral diuretics. Entresto had to be held due to acute kidney injury. Acute coronary syndrome ruled out. Discharged after 3 days.   He presents today for his initial visit with a chief complaint of minimal shortness of breath upon moderate exertion. He describes this as chronic in nature having been present for several years. He has associated fatigue, cough, pedal edema, palpitations and easy bruising along with this. He denies any difficulty sleeping, dizziness, abdominal distention, chest pain or weight gain.   Past Medical History:  Diagnosis Date  . Allergy   . CHF (congestive heart failure) (HCC)   . Coronary artery disease   . Gout   . Insomnia   . Renal disorder   . Sleeps in sitting position due to orthopnea   . TIA (transient ischemic attack)    Past Surgical History:  Procedure Laterality Date  . CARDIAC CATHETERIZATION N/A 02/02/2016   Procedure: Left Heart Cath and Coronary  Angiography;  Surgeon: Lamar BlinksBruce J Kowalski, MD;  Location: [facility] INVASIVE CV LAB;  Service: Cardiovascular;  Laterality: N/A;  . CARDIAC SURGERY     3 stents in "main artery"  . JOINT REPLACEMENT     Family History  Problem Relation Age of Onset  . Hypertension Mother   . Pancreatic cancer Mother   . Hypertension Father   . Prostate cancer Father    Social History   Tobacco Use  . Smoking status: Former Smoker    Packs/day: 2.00    Years: 3.00    Pack years: 6.00    Types: Cigarettes    Last attempt to quit: 1960    Years since quitting: 60.1  . Smokeless tobacco: Never Used  . Tobacco comment: Smoking cessation materials not required  Substance Use Topics  . Alcohol use: No   Allergies  Allergen Reactions  . Niaspan [Niacin Er] Other (See Comments)    Reaction:  Hot flashes    Prior to Admission medications   Medication Sig Start Date End Date Taking? Authorizing Provider  allopurinol (ZYLOPRIM) 300 MG tablet Take 1 tablet (300 mg total) by mouth daily. 07/22/18  Yes Ojie, Jude, MD  amiodarone (PACERONE) 200 MG tablet Take 200 mg by mouth daily.    Yes [name], [name], MD  atorvastatin (LIPITOR) 20 MG tablet Take 20 mg by mouth daily.  01/05/18 01/05/19 Yes [provider]  carvedilol (COREG) 12.5 MG tablet Take 1 tablet (12.5  mg total) by mouth 2 (two) times daily for 30 days. 07/22/18 08/24/18 Yes Ojie, Jude, MD  furosemide (LASIX) 40 MG tablet Take 1 tablet (40 mg total) by mouth 2 (two) times daily for 30 days. 07/22/18 08/24/18 Yes Ojie, Jude, MD  hydrALAZINE (APRESOLINE) 25 MG tablet Take 2 tablets (50 mg total) by mouth every 8 (eight) hours. 07/22/18  Yes Ojie, Jude, MD  isosorbide mononitrate (IMDUR) 30 MG 24 hr tablet Take 30 mg by mouth daily.    Yes [provider]  loratadine (CLARITIN) 10 MG tablet Take 1 tablet (10 mg total) by mouth daily. Patient taking differently: Take 10 mg by mouth daily as needed for allergies.  08/06/16  Yes Duanne Limerick, MD   potassium chloride SA (K-DUR,KLOR-CON) 20 MEQ tablet Take 1 tablet (20 mEq total) by mouth 2 (two) times daily. 07/22/18  Yes Ojie, Jude, MD  timolol (TIMOPTIC) 0.5 % ophthalmic solution Place 1 drop into both eyes 2 (two) times daily. Dr Inez Pilgrim   Yes [provider]  mometasone (NASONEX) 50 MCG/ACT nasal spray Place 2 sprays into the nose daily. Patient not taking: Reported on 08/24/2018 09/29/17   Duanne Limerick, MD  montelukast (SINGULAIR) 10 MG tablet TAKE ONE TABLET BY MOUTH AT BEDTIME Patient not taking: Reported on 08/24/2018 08/13/18   Duanne Limerick, MD    Review of Systems  Constitutional: Positive for fatigue. Negative for appetite change.  HENT: Negative for congestion, rhinorrhea and sore throat.   Eyes: Negative.   Respiratory: Positive for cough and shortness of breath (with moderate exertion). Negative for chest tightness.   Cardiovascular: Positive for palpitations (at times) and leg swelling. Negative for chest pain.  Gastrointestinal: Negative for abdominal distention and abdominal pain.  Endocrine: Negative.   Genitourinary: Negative.   Musculoskeletal: Negative for back pain and neck pain.  Skin: Negative.   Allergic/Immunologic: Negative.   Neurological: Negative for dizziness and light-headedness.  Hematological: Negative for adenopathy. Bruises/bleeds easily.  Psychiatric/Behavioral: Negative for dysphoric mood and sleep disturbance (sleeping in recliner due to comfort). The patient is not nervous/anxious.    Vitals:   08/24/18 1218  BP: 131/71  Pulse: (!) 57  Resp: 18  SpO2: 97%  Weight: 219 lb 8 oz (99.6 kg)  Height: 5\' 11"  (1.803 m)   Wt Readings from Last 3 Encounters:  08/24/18 219 lb 8 oz (99.6 kg)  07/22/18 215 lb (97.5 kg)  07/02/18 227 lb (103 kg)   Lab Results  Component Value Date   CREATININE 1.84 (H) 07/22/2018   CREATININE 2.07 (H) 07/21/2018   CREATININE 1.95 (H) 07/20/2018    Physical Exam Vitals signs and nursing note  reviewed.  Constitutional:      Appearance: He is well-developed.  HENT:     Head: Normocephalic and atraumatic.  Neck:     Musculoskeletal: Normal range of motion and neck supple.  Cardiovascular:     Rate and Rhythm: Regular rhythm. Bradycardia present.  Pulmonary:     Effort: Pulmonary effort is normal. No respiratory distress.     Breath sounds: No wheezing or rales.  Musculoskeletal:     Right lower leg: Edema (1+ pitting) present.     Left lower leg: Edema (1+ pitting) present.  Skin:    General: Skin is warm and dry.  Neurological:     General: No focal deficit present.     Mental Status: He is alert and oriented to person, place, and time.  Psychiatric:  Behavior: Behavior normal.    Assessment & Plan:  1: Chronic heart failure with reduced ejection fraction- - NYHA class II - euvolemic today - weighing daily and he was reminded to call for an overnight weight gain of >2 pounds or a weekly weight gain of >5 pounds - not adding salt and his granddaughter-in-law does most of the cooking. [name] says that they have been reading food labels closely for sodium content. Reviewed the importance of closely following a 2000mg  sodium diet and written dietary information was given to him about this  - consider changing medications to bidil - consider adding entresto if able in the future - saw cardiology Gwen Pounds) 08/03/2018 - BNP 07/19/2018 was 3561.0 - PharmD reconciled medications - reports receiving his flu vaccine for this season  2: Chronic kidney disease- - saw PCP Yetta Barre) 06/12/18 - BMP from 07/22/2018 reviewed and showed sodium 140, potassium 3.0, creatinine 1.84 and GFR 35 - had labs recently at nephrology [facility]) so will request those results  3: Lymphedema- - stage 2 - wearing compression socks but edema persists - elevating his legs "some" and he was encouraged to elevate them when sitting for long periods of time - limited in his ability to exercise  due to mobility issues - consider lymphapress compression boots if edema persists  Patient did not bring his medications nor a list. Each medication was verbally reviewed with the patient and he was encouraged to bring the bottles to every visit to confirm accuracy of list.  Return in 6 weeks or sooner for any questions/problems before then.

## 2018-08-24 NOTE — Progress Notes (Signed)
[facility] - PHARMACIST COUNSELING NOTE  ADHERENCE ASSESSMENT  Adherence strategy: pill box   Do you ever forget to take your medication? [x] Yes (1) [x] No (0)  Do you ever skip doses due to side effects? [] Yes (1) [x] No (0)  Do you have trouble affording your medicines? [] Yes (1) [x] No (0)  Are you ever unable to pick up your medication due to transportation difficulties? [] Yes (1) [x] No (0)  Do you ever stop taking your medications because you don't believe they are helping? [] Yes (1) [x] No (0)  Total score _1______    Recommendations given to patient about increasing adherence: Reports rarely missing a dose, maybe a couple of times a month.  Guideline-Directed Medical Therapy/Evidence Based Medicine    ACE/ARB/ARNI: none (held for AKI)   Beta Blocker: carvedilol 12.5 mg twice daily   Aldosterone Antagonist: none Diuretic: furosemide 40 mg twice daily    SUBJECTIVE   HPI: 78 year old male with recent hospitalization 07/19/18 for acute on chronic HF.  Past Medical History:  Diagnosis Date  . Allergy   . CHF (congestive heart failure) ([place])   . Coronary artery disease   . Gout   . Insomnia   . Renal disorder   . Sleeps in sitting position due to orthopnea   . TIA (transient ischemic attack)         OBJECTIVE    ECHO: Date 07/20/18, EF 25-30%  Cath: Date 02/02/16, EF 30%  BMP Latest Ref Rng & Units 07/22/2018 07/21/2018 07/20/2018  Glucose 70 - 99 mg/dL 108(H) 110(H) 107(H)  BUN 8 - 23 mg/dL 36(H) 35(H) 31(H)  Creatinine 0.61 - 1.24 mg/dL 1.84(H) 2.07(H) 1.95(H)  BUN/Creat Ratio 10 - 24 - - -  Sodium 135 - 145 mmol/L 140 139 140  Potassium 3.5 - 5.1 mmol/L 3.0(L) 3.3(L) 3.0(L)  Chloride 98 - 111 mmol/L 107 108 110  CO2 22 - 32 mmol/L 26 26 24   Calcium 8.9 - 10.3 mg/dL 8.1(L) 8.2(L) 8.1(L)    ASSESSMENT 78 year old male with HFrEF. He was recently discharged from the hospital about a month ago. He does not report any  issues with medication. Is taking furosemide twice daily with potassium supplement. His potassium was low at discharge. He weighs himself daily and has not noticed any significant weight gain.    PLAN Medication held in setting of AKI. Recommend adding guideline-directed therapy back as able if renal function improves and BP allows. Encouraged patient to continue weighing himself and to report weight gain of 2 pounds overnight or 5 pounds in one week.     Time spent: 10 minutes  [place], Pharm.D. 08/24/2018 1:10 PM    Current Outpatient Medications:  .  allopurinol (ZYLOPRIM) 300 MG tablet, Take 1 tablet (300 mg total) by mouth daily., Disp: 30 tablet, Rfl: 0 .  amiodarone (PACERONE) 200 MG tablet, Take 200 mg by mouth daily. , Disp: , Rfl:  .  atorvastatin (LIPITOR) 20 MG tablet, Take 20 mg by mouth daily. , Disp: , Rfl:  .  carvedilol (COREG) 12.5 MG tablet, Take 1 tablet (12.5 mg total) by mouth 2 (two) times daily for 30 days., Disp: 60 tablet, Rfl: 0 .  furosemide (LASIX) 40 MG tablet, Take 1 tablet (40 mg total) by mouth 2 (two) times daily for 30 days., Disp: 60 tablet, Rfl: 0 .  hydrALAZINE (APRESOLINE) 25 MG tablet, Take 2 tablets (50 mg total) by mouth every 8 (eight) hours., Disp: ,  Rfl:  .  isosorbide mononitrate (IMDUR) 30 MG 24 hr tablet, Take 30 mg by mouth daily. , Disp: , Rfl:  .  loratadine (CLARITIN) 10 MG tablet, Take 1 tablet (10 mg total) by mouth daily. (Patient taking differently: Take 10 mg by mouth daily as needed for allergies. ), Disp: 30 tablet, Rfl: 5 .  mometasone (NASONEX) 50 MCG/ACT nasal spray, Place 2 sprays into the nose daily. (Patient not taking: Reported on 08/24/2018), Disp: 17 g, Rfl: 12 .  montelukast (SINGULAIR) 10 MG tablet, TAKE ONE TABLET BY MOUTH AT BEDTIME (Patient not taking: Reported on 08/24/2018), Disp: 30 tablet, Rfl: 2 .  potassium chloride SA (K-DUR,KLOR-CON) 20 MEQ tablet, Take 1 tablet (20 mEq total) by mouth 2 (two) times  daily., Disp: 60 tablet, Rfl: 0 .  timolol (TIMOPTIC) 0.5 % ophthalmic solution, Place 1 drop into both eyes 2 (two) times daily. Dr Wallace Going, Disp: , Rfl:    COUNSELING POINTS/CLINICAL PEARLS  Carvedilol (Goal: weight less than 85 kg is 25 mg BID, weight greater than 85 kg is 50 mg BID)  Patient should avoid activities requiring coordination until drug effects are realized, as drug may cause dizziness.  This drug may cause diarrhea, nausea, vomiting, arthralgia, back pain, myalgia, headache, vision disorder, erectile dysfunction, reduced libido, or fatigue.  Instruct patient to report signs/symptoms of adverse cardiovascular effects such as hypotension (especially in elderly patients), arrhythmias, syncope, palpitations, angina, or edema.  Drug may mask symptoms of hypoglycemia. Advise diabetic patients to carefully monitor blood sugar levels.  Patient should take drug with food.  Advise patient against sudden discontinuation of drug. Furosemide  Drug causes sun-sensitivity. Advise patient to use sunscreen and avoid tanning beds. Patient should avoid activities requiring coordination until drug effects are realized, as drug may cause dizziness, vertigo, or blurred vision. This drug may cause hyperglycemia, hyperuricemia, constipation, diarrhea, loss of appetite, nausea, vomiting, purpuric disorder, cramps, spasticity, asthenia, headache, paresthesia, or scaling eczema. Instruct patient to report unusual bleeding/bruising or signs/symptoms of hypotension, infection, pancreatitis, or ototoxicity (tinnitus, hearing impairment). Advise patient to report signs/symptoms of a severe skin reactions (flu-like symptoms, spreading red rash, or skin/mucous membrane blistering) or erythema multiforme. Instruct patient to eat high-potassium foods during drug therapy, as directed by healthcare professional.  Patient should not drink alcohol while taking this drug.   DRUGS TO AVOID IN HEART FAILURE  Drug  or Class Mechanism  Analgesics . NSAIDs . COX-2 inhibitors . Glucocorticoids  Sodium and water retention, increased systemic vascular resistance, decreased response to diuretics   Diabetes Medications . Metformin . Thiazolidinediones o Rosiglitazone (Avandia) o Pioglitazone (Actos) . DPP4 Inhibitors o Saxagliptin (Onglyza) o Sitagliptin (Januvia)   Lactic acidosis Possible calcium channel blockade   Unknown  Antiarrhythmics . Class I  o Flecainide o Disopyramide . Class III o Sotalol . Other o Dronedarone  Negative inotrope, proarrhythmic   Proarrhythmic, beta blockade  Negative inotrope  Antihypertensives . Alpha Blockers o Doxazosin . Calcium Channel Blockers o Diltiazem o Verapamil o Nifedipine . Central Alpha Adrenergics o Moxonidine . Peripheral Vasodilators o Minoxidil  Increases renin and aldosterone  Negative inotrope    Possible sympathetic withdrawal  Unknown  Anti-infective . Itraconazole . Amphotericin B  Negative inotrope Unknown  Hematologic . Anagrelide . Cilostazol   Possible inhibition of PD IV Inhibition of PD III causing arrhythmias  Neurologic/Psychiatric . Stimulants . Anti-Seizure Drugs o Carbamazepine o Pregabalin . Antidepressants o Tricyclics o Citalopram . Parkinsons o Bromocriptine o Pergolide o Pramipexole . Antipsychotics  o Clozapine . Antimigraine o Ergotamine o Methysergide . Appetite suppressants . Bipolar o Lithium  Peripheral alpha and beta agonist activity  Negative inotrope and chronotrope Calcium channel blockade  Negative inotrope, proarrhythmic Dose-dependent QT prolongation  Excessive serotonin activity/valvular damage Excessive serotonin activity/valvular damage Unknown  IgE mediated hypersensitivy, calcium channel blockade  Excessive serotonin activity/valvular damage Excessive serotonin activity/valvular damage Valvular damage  Direct myofibrillar degeneration, adrenergic  stimulation  Antimalarials . Chloroquine . Hydroxychloroquine Intracellular inhibition of lysosomal enzymes  Urologic Agents . Alpha Blockers o Doxazosin o Prazosin o Tamsulosin o Terazosin  Increased renin and aldosterone  Adapted from Page RL, et al. "Drugs That May Cause or Exacerbate Heart Failure: A Scientific Statement from the [place]." Circulation 2016; 283:T51-V61. DOI: 10.1161/CIR.0000000000000426   MEDICATION ADHERENCES TIPS AND STRATEGIES 1. Taking medication as prescribed improves patient outcomes in heart failure (reduces hospitalizations, improves symptoms, increases survival) 2. Side effects of medications can be managed by decreasing doses, switching agents, stopping drugs, or adding additional therapy. Please let someone in the [facility] know if you have having bothersome side effects so we can modify your regimen. Do not alter your medication regimen without talking to [place].  3. Medication reminders can help patients remember to take drugs on time. If you are missing or forgetting doses you can try linking behaviors, using pill boxes, or an electronic reminder like an alarm on your phone or an app. Some people can also get automated phone calls as medication reminders.

## 2018-09-07 ENCOUNTER — Encounter: Payer: Self-pay | Admitting: Family Medicine

## 2018-09-07 ENCOUNTER — Ambulatory Visit (INDEPENDENT_AMBULATORY_CARE_PROVIDER_SITE_OTHER): Payer: Medicare Other | Admitting: Family Medicine

## 2018-09-07 VITALS — BP 128/78 | HR 64 | Ht 71.0 in | Wt 228.0 lb

## 2018-09-07 DIAGNOSIS — N183 Chronic kidney disease, stage 3 unspecified: Secondary | ICD-10-CM

## 2018-09-07 DIAGNOSIS — R001 Bradycardia, unspecified: Secondary | ICD-10-CM | POA: Diagnosis not present

## 2018-09-07 DIAGNOSIS — H1011 Acute atopic conjunctivitis, right eye: Secondary | ICD-10-CM | POA: Diagnosis not present

## 2018-09-07 DIAGNOSIS — I5023 Acute on chronic systolic (congestive) heart failure: Secondary | ICD-10-CM

## 2018-09-07 DIAGNOSIS — I890 Lymphedema, not elsewhere classified: Secondary | ICD-10-CM

## 2018-09-07 MED ORDER — OLOPATADINE HCL 0.1 % OP SOLN: 1.0000 [drp] | Freq: Two times a day (BID) | OPHTHALMIC | 12 refills | Status: DC

## 2018-09-07 NOTE — Patient Instructions (Signed)

## 2018-09-07 NOTE — Progress Notes (Signed)
Date:  09/07/2018   Name:  Larry Lawrence   DOB:  01-04-1941   MRN:  [id number]   Chief Complaint: Eye Problem (R) eye started yesterday with itching and watering. Seems to be closed a little more than the left eye)  Eye Problem   The right eye is affected. This is a new problem. The current episode started yesterday. The problem occurs constantly. The problem has been unchanged. There was no injury mechanism. The pain is mild (just irratated). Associated symptoms include eye redness and itching. Pertinent negatives include no blurred vision, eye discharge, double vision, fever, foreign body sensation, nausea, photophobia, recent URI or vomiting. Associated symptoms comments: Hx of glaucoma. He has tried nothing for the symptoms.    Review of Systems  Constitutional: Negative for chills and fever.  HENT: Negative for drooling, ear discharge, ear pain, postnasal drip, rhinorrhea, sinus pain and sore throat.   Eyes: Positive for redness and itching. Negative for blurred vision, double vision, photophobia, pain, discharge and visual disturbance.  Respiratory: Negative for cough, shortness of breath and wheezing.   Cardiovascular: Negative for chest pain, palpitations and leg swelling.  Gastrointestinal: Negative for abdominal pain, blood in stool, constipation, diarrhea, nausea and vomiting.  Endocrine: Negative for polydipsia.  Genitourinary: Negative for dysuria, frequency, hematuria and urgency.  Musculoskeletal: Negative for back pain, myalgias and neck pain.  Skin: Negative for rash.  Allergic/Immunologic: Negative for environmental allergies.  Neurological: Negative for dizziness and headaches.  Hematological: Does not bruise/bleed easily.  Psychiatric/Behavioral: Negative for suicidal ideas. The patient is not nervous/anxious.     Patient Active Problem List   Diagnosis Date Noted  . Lymphedema 08/24/2018  . Acute CHF (congestive heart failure) (HCC) 06/30/2018  . Near syncope  04/24/2017  . Bradycardia 04/24/2017  . Gout 02/01/2016  . Syncope 08/09/2015  . Hypokalemia 08/09/2015  . CKD (chronic kidney disease), stage III (HCC) 02/20/2015  . Chronic systolic CHF (congestive heart failure) (HCC) 02/20/2015  . CKD (chronic kidney disease) 02/20/2015    Allergies  Allergen Reactions  . Niaspan [Niacin Er] Other (See Comments)    Reaction:  Hot flashes     Past Surgical History:  Procedure Laterality Date  . CARDIAC CATHETERIZATION N/A 02/02/2016   Procedure: Left Heart Cath and Coronary Angiography;  Surgeon: Lamar Blinks, MD;  Location: [facility] INVASIVE CV LAB;  Service: Cardiovascular;  Laterality: N/A;  . CARDIAC SURGERY     3 stents in "main artery"  . JOINT REPLACEMENT      Social History   Tobacco Use  . Smoking status: Former Smoker    Packs/day: 2.00    Years: 3.00    Pack years: 6.00    Types: Cigarettes    Last attempt to quit: 1960    Years since quitting: 60.2  . Smokeless tobacco: Never Used  . Tobacco comment: Smoking cessation materials not required  Substance Use Topics  . Alcohol use: No  . Drug use: No     Medication list has been reviewed and updated.  Current Meds  Medication Sig  . allopurinol (ZYLOPRIM) 300 MG tablet Take 1 tablet (300 mg total) by mouth daily.  Marland Kitchen amiodarone (PACERONE) 200 MG tablet Take 200 mg by mouth daily. Dr Gwen Pounds  . atorvastatin (LIPITOR) 20 MG tablet Take 20 mg by mouth daily.   . carvedilol (COREG) 12.5 MG tablet Take 1 tablet (12.5 mg total) by mouth 2 (two) times daily for 30 days. (Patient taking differently: Take 12.5  mg by mouth 2 (two) times daily. Dr Gwen PoundsKowalski)  . furosemide (LASIX) 40 MG tablet Take 1 tablet (40 mg total) by mouth 2 (two) times daily for 30 days. (Patient taking differently: Take 40 mg by mouth 2 (two) times daily. Dr Gwen PoundsKowalski)  . hydrALAZINE (APRESOLINE) 25 MG tablet Take 2 tablets (50 mg total) by mouth every 8 (eight) hours. (Patient taking differently: Take 50 mg by  mouth every 8 (eight) hours. Dr Gwen PoundsKowalski)  . isosorbide mononitrate (IMDUR) 30 MG 24 hr tablet Take 30 mg by mouth daily.   . montelukast (SINGULAIR) 10 MG tablet TAKE ONE TABLET BY MOUTH AT BEDTIME  . potassium chloride SA (K-DUR,KLOR-CON) 20 MEQ tablet Take 1 tablet (20 mEq total) by mouth 2 (two) times daily. (Patient taking differently: Take 20 mEq by mouth 3 (three) times daily. Dr Gwen PoundsKowalski)  . timolol (TIMOPTIC) 0.5 % ophthalmic solution Place 1 drop into both eyes 2 (two) times daily. Dr Inez PilgrimBrasington    [facility] Sc[facility] 06/25/2017 04/15/2017 12/29/2015  PHQ - 2 Score 0 0 0  PHQ- 9 Score - 0 -    Physical Exam Vitals signs and nursing note reviewed.  HENT:     Head: Normocephalic.     Right Ear: Tympanic membrane, ear canal and external ear normal.     Left Ear: Tympanic membrane, ear canal and external ear normal.     Nose: Nose normal.  Eyes:     General: Lids are normal. No scleral icterus.       Right eye: No discharge.        Left eye: No discharge.     Extraocular Movements: Extraocular movements intact.     Conjunctiva/sclera:     Right eye: Right conjunctiva is injected.     Left eye: Left conjunctiva is not injected.     Pupils: Pupils are equal, round, and reactive to light.  Neck:     Musculoskeletal: Normal range of motion and neck supple. No edema or erythema.     Thyroid: No thyroid mass, thyromegaly or thyroid tenderness.     Vascular: No carotid bruit, hepatojugular reflux or JVD.     Trachea: No tracheal deviation.  Cardiovascular:     Rate and Rhythm: Normal rate and regular rhythm.     Chest Wall: PMI is not displaced.     Pulses: Normal pulses.     Heart sounds: Normal heart sounds, S1 normal and S2 normal. No murmur. No systolic murmur. No diastolic murmur. No friction rub. No gallop. No S3 or S4 sounds.   Pulmonary:     Effort: No respiratory distress.     Breath sounds: Normal breath sounds. No wheezing or rales.  Abdominal:     General: Bowel sounds  are normal.     Palpations: Abdomen is soft. There is no mass.     Tenderness: There is no abdominal tenderness. There is no guarding or rebound.  Musculoskeletal: Normal range of motion.        General: No tenderness.     Right lower leg: 4+ Pitting Edema present.     Left lower leg: 4+ Pitting Edema present.  Lymphadenopathy:     Cervical: No cervical adenopathy.  Skin:    General: Skin is warm.     Findings: No rash.  Neurological:     Mental Status: He is alert and oriented to person, place, and time.     Cranial Nerves: No cranial nerve deficit.     Deep Tendon  Reflexes: Reflexes are normal and symmetric.     BP 128/78   Pulse 64   Ht 5\' 11"  (1.803 m)   Wt 228 lb (103.4 kg)   BMI 31.80 kg/m  sched cardio for tomorrow @ 9:15 in  Assessment and Plan: 1. Acute on chronic clinical systolic heart failure [facility]) Patient has been noted to have had an 8 pound weight gain for which he was to contact cardiology if he had greater than a 5 pound weight gain.  On further questioning patient really never fully reclines when he sleeps and there is a gradual accumulation of fluid in his lower extremities.  Patient's had increasing dyspnea on exertion no chest discomfort.  Patient will continue Lasix 40 mg twice a day but an appointment was made for tomorrow with cardiology if diuresis needs to be increased. - Ambulatory referral to Cardiology - Basic metabolic panel  2. Allergic conjunctivitis of right eye Patient has an irritating itchiness of his right eye without resting or drainage.  There is a mild increase in erythema of the right conjunctiva and perhaps even less so at the left this is likely allergic conjunctivitis for which we will initiate Patanol 0.1% ophthalmic solution 1 drop in both eyes twice a day. - olopatadine (PATANOL) 0.1 % ophthalmic solution; Place 1 drop into both eyes 2 (two) times daily.  Dispense: 5 mL; Refill: 12  3. Lymphedema Patient has a history of  lymphedema for which he wears venous compression stockings.  Patient notes that there is been increasing swelling over the course of the last 2 weeks and is been unable to use the stockings due to the degree of swelling.  4. Bradycardia Patient was noted to have some bradycardia in the past as well as having a pulse rate of 57 today Review of medications notes that he is on amiodarone and atenolol for his glaucoma.  Suspect this is the basis of his bradycardia and he is asymptomatic with this. 5. CKD (chronic kidney disease), stage III (HCC) Has CKD stage III for which she is followed by nephrology.  Patient will obtain a basic metabolic panel today for review by Dr. Gwen Pounds prior to increase in diuretic.

## 2018-09-08 DIAGNOSIS — I5022 Chronic systolic (congestive) heart failure: Secondary | ICD-10-CM | POA: Diagnosis not present

## 2018-09-08 DIAGNOSIS — I2510 Atherosclerotic heart disease of native coronary artery without angina pectoris: Secondary | ICD-10-CM | POA: Diagnosis not present

## 2018-09-08 DIAGNOSIS — I10 Essential (primary) hypertension: Secondary | ICD-10-CM | POA: Diagnosis not present

## 2018-09-08 DIAGNOSIS — R600 Localized edema: Secondary | ICD-10-CM | POA: Diagnosis not present

## 2018-09-08 LAB — BASIC METABOLIC PANEL
BUN/Creatinine Ratio: 17 (ref 10–24)
BUN: 35 mg/dL — ABNORMAL HIGH (ref 8–27)
CALCIUM: 8.7 mg/dL (ref 8.6–10.2)
CO2: 20 mmol/L (ref 20–29)
Chloride: 106 mmol/L (ref 96–106)
Creatinine, Ser: 2.12 mg/dL — ABNORMAL HIGH (ref 0.76–1.27)
GFR calc Af Amer: 34 mL/min/{1.73_m2} — ABNORMAL LOW (ref >59)
GFR calc non Af Amer: 30 mL/min/{1.73_m2} — ABNORMAL LOW (ref >59)
GLUCOSE: 99 mg/dL (ref 65–99)
Potassium: 4.6 mmol/L (ref 3.5–5.2)
Sodium: 144 mmol/L (ref 134–144)

## 2018-09-11 DIAGNOSIS — N183 Chronic kidney disease, stage 3 (moderate): Secondary | ICD-10-CM | POA: Diagnosis not present

## 2018-09-11 DIAGNOSIS — I10 Essential (primary) hypertension: Secondary | ICD-10-CM | POA: Diagnosis not present

## 2018-09-11 DIAGNOSIS — I2510 Atherosclerotic heart disease of native coronary artery without angina pectoris: Secondary | ICD-10-CM | POA: Diagnosis not present

## 2018-09-11 DIAGNOSIS — I5022 Chronic systolic (congestive) heart failure: Secondary | ICD-10-CM | POA: Diagnosis not present

## 2018-09-11 DIAGNOSIS — I5023 Acute on chronic systolic (congestive) heart failure: Secondary | ICD-10-CM | POA: Diagnosis not present

## 2018-10-01 DIAGNOSIS — R918 Other nonspecific abnormal finding of lung field: Secondary | ICD-10-CM | POA: Diagnosis not present

## 2018-10-01 DIAGNOSIS — I5022 Chronic systolic (congestive) heart failure: Secondary | ICD-10-CM | POA: Diagnosis not present

## 2018-10-01 DIAGNOSIS — J90 Pleural effusion, not elsewhere classified: Secondary | ICD-10-CM | POA: Diagnosis not present

## 2018-10-07 ENCOUNTER — Telehealth: Payer: Self-pay | Admitting: Family Medicine

## 2018-10-07 NOTE — Telephone Encounter (Signed)
Called to RE-schedule Medicare Annual Wellness Visit with Nurse Health Advisor.  ° °If patient returns call, please schedule AWV with [facility] ~~AFTER June 1ST~~ ° ° °For any questions please contact:  °Kathryn Brown [phone] or skype at: [email or web address]  ° ° °

## 2018-10-09 NOTE — Telephone Encounter (Signed)
2nd attempt

## 2018-10-12 ENCOUNTER — Telehealth: Payer: Self-pay | Admitting: Family

## 2018-10-12 ENCOUNTER — Telehealth: Payer: Medicare Other | Admitting: Family

## 2018-10-12 NOTE — Telephone Encounter (Signed)
Received phone call from patient's daughter regarding her dad. She says that he's gained 7 pounds in the last 2 days. She says that he saw cardiology Gwen Pounds) in the last 1-2 weeks and he had a CXR done (showed pneumonia per her report) along with lab work. An antibiotic was started for 5 days which he has finished and his furosemide was decreased from twice daily to once daily and she's not sure why. She did say that his renal function has been stable.   I'm unable to view a recent note nor labs from a visit in the last 1-2 weeks. Advised his daughter that it would be best for her to call Dr. Philemon Kingdom office since he had seen him most recently and did labs. She understood and said that she would call their office after getting off the phone with [place].

## 2018-10-22 ENCOUNTER — Encounter: Payer: Self-pay | Admitting: Family Medicine

## 2018-10-22 NOTE — Telephone Encounter (Signed)
3rd attempt

## 2018-11-02 DIAGNOSIS — I5022 Chronic systolic (congestive) heart failure: Secondary | ICD-10-CM | POA: Diagnosis not present

## 2018-11-02 DIAGNOSIS — I2510 Atherosclerotic heart disease of native coronary artery without angina pectoris: Secondary | ICD-10-CM | POA: Diagnosis not present

## 2018-11-02 DIAGNOSIS — I10 Essential (primary) hypertension: Secondary | ICD-10-CM | POA: Diagnosis not present

## 2018-11-02 DIAGNOSIS — I472 Ventricular tachycardia: Secondary | ICD-10-CM | POA: Diagnosis not present

## 2018-11-02 DIAGNOSIS — I6523 Occlusion and stenosis of bilateral carotid arteries: Secondary | ICD-10-CM | POA: Diagnosis not present

## 2018-11-18 DIAGNOSIS — R600 Localized edema: Secondary | ICD-10-CM | POA: Diagnosis not present

## 2018-11-18 DIAGNOSIS — N183 Chronic kidney disease, stage 3 (moderate): Secondary | ICD-10-CM | POA: Diagnosis not present

## 2018-11-18 DIAGNOSIS — R001 Bradycardia, unspecified: Secondary | ICD-10-CM | POA: Diagnosis not present

## 2018-11-18 DIAGNOSIS — I5022 Chronic systolic (congestive) heart failure: Secondary | ICD-10-CM | POA: Diagnosis not present

## 2018-11-18 DIAGNOSIS — I10 Essential (primary) hypertension: Secondary | ICD-10-CM | POA: Diagnosis not present

## 2018-11-20 ENCOUNTER — Other Ambulatory Visit: Payer: Self-pay | Admitting: Family Medicine

## 2018-11-20 DIAGNOSIS — J309 Allergic rhinitis, unspecified: Secondary | ICD-10-CM

## 2018-11-20 DIAGNOSIS — J302 Other seasonal allergic rhinitis: Secondary | ICD-10-CM

## 2018-12-07 ENCOUNTER — Telehealth: Payer: Medicare Other | Admitting: Family

## 2019-01-13 NOTE — Progress Notes (Deleted)
Patient ID: Larry Lawrence, male    DOB: Dec 23, 1940, 78 y.o.   MRN: [id number]  HPI  Larry Lawrence is a 78 y/o male with a history of CAD, CKD, gout, TIA, remote tobacco use and chronic heart failure.   Echo report from 07/20/2018 reviewed and showed an EF of 25-30% along with mild AR and moderate Larry/TR.   Catheterization done 02/02/16 showed an EF of 30% along with:  RPDA lesion, 55 %stenosed.  Prox RCA to Mid RCA lesion, 30 %stenosed.  Mid LAD to Dist LAD lesion, 20 %stenosed.  Prox LAD to Mid LAD lesion, 30 %stenosed.  Ramus-2 lesion, 40 %stenosed.  Ramus-1 lesion, 35 %stenosed.  Mid Cx to Dist Cx lesion, 40 %stenosed.  abnormal left ventricular function with ejection fraction of 30% and global dysfunction moderate 3 vessel coronary artery disease with patent lad stent There is not significant stenosis of left anterior descending, lcx , and or rca  Admitted 07/19/2018 due to acute on chronic HF. Cardiology and nephrology consults obtained. Initially given IV lasix and then transitioned to oral diuretics. Entresto had to be held due to acute kidney injury. Acute coronary syndrome ruled out. Discharged after 3 days.   He presents today for a follow-up visit with a chief complaint of   Past Medical History:  Diagnosis Date  . Allergy   . CHF (congestive heart failure) (HCC)   . Coronary artery disease   . Gout   . Insomnia   . Renal disorder   . Sleeps in sitting position due to orthopnea   . TIA (transient ischemic attack)    Past Surgical History:  Procedure Laterality Date  . CARDIAC CATHETERIZATION N/A 02/02/2016   Procedure: Left Heart Cath and Coronary Angiography;  Surgeon: Lamar BlinksBruce J Kowalski, MD;  Location: [facility] INVASIVE CV LAB;  Service: Cardiovascular;  Laterality: N/A;  . CARDIAC SURGERY     3 stents in "main artery"  . JOINT REPLACEMENT     Family History  Problem Relation Age of Onset  . Hypertension Mother   . Pancreatic cancer Mother   . Hypertension Father    . Prostate cancer Father    Social History   Tobacco Use  . Smoking status: Former Smoker    Packs/day: 2.00    Years: 3.00    Pack years: 6.00    Types: Cigarettes    Quit date: 1960    Years since quitting: 60.5  . Smokeless tobacco: Never Used  . Tobacco comment: Smoking cessation materials not required  Substance Use Topics  . Alcohol use: No   Allergies  Allergen Reactions  . Niaspan [Niacin Er] Other (See Comments)    Reaction:  Hot flashes      Review of Systems  Constitutional: Positive for fatigue. Negative for appetite change.  HENT: Negative for congestion, rhinorrhea and sore throat.   Eyes: Negative.   Respiratory: Positive for cough and shortness of breath (with moderate exertion). Negative for chest tightness.   Cardiovascular: Positive for palpitations (at times) and leg swelling. Negative for chest pain.  Gastrointestinal: Negative for abdominal distention and abdominal pain.  Endocrine: Negative.   Genitourinary: Negative.   Musculoskeletal: Negative for back pain and neck pain.  Skin: Negative.   Allergic/Immunologic: Negative.   Neurological: Negative for dizziness and light-headedness.  Hematological: Negative for adenopathy. Bruises/bleeds easily.  Psychiatric/Behavioral: Negative for dysphoric mood and sleep disturbance (sleeping in recliner due to comfort). The patient is not nervous/anxious.      Physical Exam  Vitals signs and nursing note reviewed.  Constitutional:      Appearance: He is well-developed.  HENT:     Head: Normocephalic and atraumatic.  Neck:     Musculoskeletal: Normal range of motion and neck supple.  Cardiovascular:     Rate and Rhythm: Regular rhythm. Bradycardia present.  Pulmonary:     Effort: Pulmonary effort is normal. No respiratory distress.     Breath sounds: No wheezing or rales.  Musculoskeletal:     Right lower leg: Edema (1+ pitting) present.     Left lower leg: Edema (1+ pitting) present.  Skin:     General: Skin is warm and dry.  Neurological:     General: No focal deficit present.     Mental Status: He is alert and oriented to person, place, and time.  Psychiatric:        Behavior: Behavior normal.    Assessment & Plan:  1: Chronic heart failure with reduced ejection fraction- - NYHA class II - euvolemic today - weighing daily and he was reminded to call for an overnight weight gain of >2 pounds or a weekly weight gain of >5 pounds - weight 219.8 from last visit 5 months ago - not adding salt and his granddaughter-in-law does most of the cooking. Yolanda Bonine says that they have been reading food labels closely for sodium content. Reviewed the importance of closely following a 2000mg  sodium diet  - consider changing medications to bidil - consider adding entresto if able in the future - saw cardiology Nehemiah Massed) 08/03/2018 - BNP 07/19/2018 was 3561.0   2: Chronic kidney disease- - saw PCP Ronnald Ramp) 09/07/2018 - BMP from 09/07/2018 reviewed and showed sodium 144, potassium 4.6, creatinine 2.12 and GFR 34 - follows with nephrology ([place])   3: Lymphedema- - stage 2 - wearing compression socks but edema persists - elevating his legs "some" and he was encouraged to elevate them when sitting for long periods of time - limited in his ability to exercise due to mobility issues - consider lymphapress compression boots if edema persists  Patient did not bring his medications nor a list. Each medication was verbally reviewed with the patient and he was encouraged to bring the bottles to every visit to confirm accuracy of list.

## 2019-01-14 ENCOUNTER — Ambulatory Visit: Payer: Medicare Other | Admitting: Family

## 2019-01-14 ENCOUNTER — Telehealth: Payer: Self-pay | Admitting: Family

## 2019-01-14 NOTE — Telephone Encounter (Signed)
Patient did not show for his [facility] appointment on 01/14/2019. Will attempt to reschedule.  

## 2019-01-18 ENCOUNTER — Other Ambulatory Visit: Payer: Self-pay | Admitting: Family Medicine

## 2019-01-18 DIAGNOSIS — M109 Gout, unspecified: Secondary | ICD-10-CM

## 2019-01-26 ENCOUNTER — Other Ambulatory Visit: Payer: Self-pay | Admitting: Family Medicine

## 2019-01-26 DIAGNOSIS — J309 Allergic rhinitis, unspecified: Secondary | ICD-10-CM

## 2019-01-26 DIAGNOSIS — J302 Other seasonal allergic rhinitis: Secondary | ICD-10-CM

## 2019-01-29 DIAGNOSIS — I472 Ventricular tachycardia: Secondary | ICD-10-CM | POA: Diagnosis not present

## 2019-01-29 DIAGNOSIS — I10 Essential (primary) hypertension: Secondary | ICD-10-CM | POA: Diagnosis not present

## 2019-01-29 DIAGNOSIS — I6523 Occlusion and stenosis of bilateral carotid arteries: Secondary | ICD-10-CM | POA: Diagnosis not present

## 2019-01-29 DIAGNOSIS — I2510 Atherosclerotic heart disease of native coronary artery without angina pectoris: Secondary | ICD-10-CM | POA: Diagnosis not present

## 2019-01-29 DIAGNOSIS — I5022 Chronic systolic (congestive) heart failure: Secondary | ICD-10-CM | POA: Diagnosis not present

## 2019-02-11 DIAGNOSIS — I5022 Chronic systolic (congestive) heart failure: Secondary | ICD-10-CM | POA: Diagnosis not present

## 2019-04-26 ENCOUNTER — Other Ambulatory Visit: Payer: Self-pay | Admitting: Family Medicine

## 2019-04-26 DIAGNOSIS — J309 Allergic rhinitis, unspecified: Secondary | ICD-10-CM

## 2019-04-26 DIAGNOSIS — J302 Other seasonal allergic rhinitis: Secondary | ICD-10-CM

## 2019-04-28 DIAGNOSIS — I5022 Chronic systolic (congestive) heart failure: Secondary | ICD-10-CM | POA: Diagnosis not present

## 2019-04-28 DIAGNOSIS — I472 Ventricular tachycardia: Secondary | ICD-10-CM | POA: Diagnosis not present

## 2019-04-28 DIAGNOSIS — I2510 Atherosclerotic heart disease of native coronary artery without angina pectoris: Secondary | ICD-10-CM | POA: Diagnosis not present

## 2019-04-28 DIAGNOSIS — I6523 Occlusion and stenosis of bilateral carotid arteries: Secondary | ICD-10-CM | POA: Diagnosis not present

## 2019-04-28 DIAGNOSIS — I10 Essential (primary) hypertension: Secondary | ICD-10-CM | POA: Diagnosis not present

## 2019-05-24 ENCOUNTER — Other Ambulatory Visit: Payer: Self-pay | Admitting: Family Medicine

## 2019-05-24 DIAGNOSIS — M109 Gout, unspecified: Secondary | ICD-10-CM

## 2019-06-09 ENCOUNTER — Other Ambulatory Visit: Payer: Self-pay

## 2019-06-09 ENCOUNTER — Encounter: Payer: Self-pay | Admitting: Family Medicine

## 2019-06-09 ENCOUNTER — Ambulatory Visit (INDEPENDENT_AMBULATORY_CARE_PROVIDER_SITE_OTHER): Payer: Medicare Other | Admitting: Family Medicine

## 2019-06-09 VITALS — BP 130/64 | HR 64 | Ht 71.0 in | Wt 209.0 lb

## 2019-06-09 DIAGNOSIS — J309 Allergic rhinitis, unspecified: Secondary | ICD-10-CM

## 2019-06-09 DIAGNOSIS — Z23 Encounter for immunization: Secondary | ICD-10-CM

## 2019-06-09 DIAGNOSIS — M109 Gout, unspecified: Secondary | ICD-10-CM

## 2019-06-09 DIAGNOSIS — T148XXA Other injury of unspecified body region, initial encounter: Secondary | ICD-10-CM

## 2019-06-09 MED ORDER — MUPIROCIN 2 % EX OINT: 1.0000 "application " | TOPICAL_OINTMENT | Freq: Two times a day (BID) | CUTANEOUS | 0 refills | Status: DC

## 2019-06-09 MED ORDER — MONTELUKAST SODIUM 10 MG PO TABS: ORAL_TABLET | ORAL | 3 refills | Status: AC

## 2019-06-09 MED ORDER — ALLOPURINOL 300 MG PO TABS: ORAL_TABLET | ORAL | 1 refills | Status: AC

## 2019-06-09 NOTE — Progress Notes (Signed)
Date:  06/09/2019   Name:  Larry Lawrence   DOB:  January 25, 1941   MRN:  [id number]   Chief Complaint: Gout (Pt accompanied by Lynelle Smoke- daughter in room. Verified that it was ok for Tammy to be in here.), Asthma (singulair), and pneum 23  Asthma There is no chest tightness, cough, difficulty breathing, frequent throat clearing, hemoptysis, hoarse voice, shortness of breath, sputum production or wheezing. This is a chronic problem. The current episode started more than 1 year ago. The problem occurs intermittently. The problem has been gradually improving. Pertinent negatives include no appetite change, chest pain, dyspnea on exertion, ear pain, fever, headaches, heartburn, myalgias, orthopnea, PND, postnasal drip, rhinorrhea or sore throat. His symptoms are aggravated by nothing. His symptoms are alleviated by leukotriene antagonist. He reports moderate improvement on treatment. His past medical history is significant for asthma.    Lab Results  Component Value Date   CREATININE 2.12 (H) 09/07/2018   BUN 35 (H) 09/07/2018   NA 144 09/07/2018   K 4.6 09/07/2018   CL 106 09/07/2018   CO2 20 09/07/2018   No results found for: CHOL, HDL, LDLCALC, LDLDIRECT, TRIG, CHOLHDL Lab Results  Component Value Date   TSH 0.036 (L) 04/24/2017   Lab Results  Component Value Date   HGBA1C 5.6 04/24/2017     Review of Systems  Constitutional: Negative for appetite change, chills and fever.  HENT: Negative for drooling, ear discharge, ear pain, hoarse voice, postnasal drip, rhinorrhea and sore throat.   Respiratory: Negative for cough, hemoptysis, sputum production, shortness of breath and wheezing.   Cardiovascular: Negative for chest pain, dyspnea on exertion, palpitations, leg swelling and PND.  Gastrointestinal: Negative for abdominal pain, blood in stool, constipation, diarrhea, heartburn and nausea.  Endocrine: Negative for polydipsia.  Genitourinary: Negative for dysuria, frequency, hematuria  and urgency.  Musculoskeletal: Negative for back pain, myalgias and neck pain.  Skin: Negative for rash.  Allergic/Immunologic: Negative for environmental allergies.  Neurological: Negative for dizziness and headaches.  Hematological: Does not bruise/bleed easily.  Psychiatric/Behavioral: Negative for suicidal ideas. The patient is not nervous/anxious.     Patient Active Problem List   Diagnosis Date Noted  . Lymphedema 08/24/2018  . Acute CHF (congestive heart failure) ([place]) 06/30/2018  . Near syncope 04/24/2017  . Bradycardia 04/24/2017  . Gout 02/01/2016  . Syncope 08/09/2015  . Hypokalemia 08/09/2015  . CKD (chronic kidney disease), stage III 02/20/2015  . Chronic systolic CHF (congestive heart failure) ([place]) 02/20/2015  . CKD (chronic kidney disease) 02/20/2015    Allergies  Allergen Reactions  . Niaspan [Niacin Er] Other (See Comments)    Reaction:  Hot flashes     Past Surgical History:  Procedure Laterality Date  . CARDIAC CATHETERIZATION N/A 02/02/2016   Procedure: Left Heart Cath and Coronary Angiography;  Surgeon: Corey Skains, MD;  Location: [place] [facility];  Service: Cardiovascular;  Laterality: N/A;  . CARDIAC SURGERY     3 stents in "main artery"  . JOINT REPLACEMENT      Social History   Tobacco Use  . Smoking status: Former Smoker    Packs/day: 2.00    Years: 3.00    Pack years: 6.00    Types: Cigarettes    Quit date: 1960    Years since quitting: 60.9  . Smokeless tobacco: Never Used  . Tobacco comment: Smoking cessation materials not required  Substance Use Topics  . Alcohol use: No  . Drug use: No  Medication list has been reviewed and updated.  Current Meds  Medication Sig  . allopurinol (ZYLOPRIM) 300 MG tablet TAKE (1) TABLET BY MOUTH TWICE DAILY  . amiodarone (PACERONE) 200 MG tablet Take 200 mg by mouth daily. Dr Gwen PoundsKowalski  . atorvastatin (LIPITOR) 20 MG tablet Take 20 mg by mouth daily.   . carvedilol (COREG) 12.5 MG  tablet Take 1 tablet (12.5 mg total) by mouth 2 (two) times daily for 30 days. (Patient taking differently: Take 25 mg by mouth 2 (two) times daily. Dr Gwen PoundsKowalski)  . hydrALAZINE (APRESOLINE) 25 MG tablet Take 2 tablets (50 mg total) by mouth every 8 (eight) hours. (Patient taking differently: Take 25 mg by mouth every 8 (eight) hours. Dr Gwen PoundsKowalski)  . isosorbide mononitrate (IMDUR) 30 MG 24 hr tablet Take 30 mg by mouth daily. kowalski  . montelukast (SINGULAIR) 10 MG tablet TAKE (1) TABLET BY MOUTH DAILY AT BEDTIME  . potassium chloride SA (K-DUR,KLOR-CON) 20 MEQ tablet Take 1 tablet (20 mEq total) by mouth 2 (two) times daily. (Patient taking differently: Take 20 mEq by mouth 3 (three) times daily. Dr Gwen PoundsKowalski)  . timolol (TIMOPTIC) 0.5 % ophthalmic solution Place 1 drop into both eyes 2 (two) times daily. Dr Inez PilgrimBrasington  . torsemide (DEMADEX) 20 MG tablet Take 2 tablets by mouth daily. Dr Tamera PuntLacayo    [facility] Scores 06/09/2019 06/25/2017 04/15/2017 12/29/2015  PHQ - 2 Score 0 0 0 0  PHQ- 9 Score 0 - 0 -    BP Readings from Last 3 Encounters:  06/09/19 130/64  09/07/18 128/78  08/24/18 131/71    Physical Exam Vitals signs and nursing note reviewed.  HENT:     Head: Normocephalic.     Right Ear: Tympanic membrane, ear canal and external ear normal.     Left Ear: Tympanic membrane, ear canal and external ear normal.     Nose: Nose normal. No congestion or rhinorrhea.  Eyes:     General: No scleral icterus.       Right eye: No discharge.        Left eye: No discharge.     Conjunctiva/sclera: Conjunctivae normal.     Pupils: Pupils are equal, round, and reactive to light.  Neck:     Musculoskeletal: Normal range of motion and neck supple.     Thyroid: No thyromegaly.     Vascular: No JVD.     Trachea: No tracheal deviation.  Cardiovascular:     Rate and Rhythm: Normal rate and regular rhythm.     Heart sounds: Normal heart sounds. No murmur. No friction rub. No gallop.   Pulmonary:      Effort: No respiratory distress.     Breath sounds: Normal breath sounds. No wheezing, rhonchi or rales.  Abdominal:     General: Bowel sounds are normal.     Palpations: Abdomen is soft. There is no mass.     Tenderness: There is no abdominal tenderness. There is no guarding or rebound.  Musculoskeletal: Normal range of motion.        General: No tenderness.  Lymphadenopathy:     Cervical: No cervical adenopathy.  Skin:    General: Skin is warm.     Findings: No rash.  Neurological:     Mental Status: He is alert and oriented to person, place, and time.     Cranial Nerves: No cranial nerve deficit.     Deep Tendon Reflexes: Reflexes are normal and symmetric.     Wt  Readings from Last 3 Encounters:  06/09/19 209 lb (94.8 kg)  09/07/18 228 lb (103.4 kg)  08/24/18 219 lb 8 oz (99.6 kg)    BP 130/64   Pulse 64   Ht 5\' 11"  (1.803 m)   Wt 209 lb (94.8 kg)   BMI 29.15 kg/m   Assessment and Plan: 1. Allergic rhinitis, unspecified seasonality, unspecified trigger Chronic.  Controlled.  Stable.  We will continue Singulair 10 mg once a day. - montelukast (SINGULAIR) 10 MG tablet; TAKE (1) TABLET BY MOUTH DAILY AT BEDTIME  Dispense: 90 tablet; Refill: 3  2. Gout, unspecified cause, unspecified chronicity, unspecified site Chronic.  Controlled.  Stable.  Will continue allopurinol 300 mg once a day to control for hyper uric acid. - allopurinol (ZYLOPRIM) 300 MG tablet; TAKE (1) TABLET BY MOUTH a day  Dispense: 90 tablet; Refill: 1  3. Abrasion New problem new onset patient has noted that after a fall he had what were appearing to be a possible like skin tears.  Patient has been using peroxide.  Patient has been given Bactroban along with wound care instructions. - mupirocin ointment (BACTROBAN) 2 %; Apply 1 application topically 2 (two) times daily.  Dispense: 22 g; Refill: 0  4. Need for 23-polyvalent pneumococcal polysaccharide vaccine Discussed and administered - Pneumococcal  polysaccharide vaccine 23-valent greater than or equal to 2yo subcutaneous/IM

## 2019-08-31 DIAGNOSIS — H40003 Preglaucoma, unspecified, bilateral: Secondary | ICD-10-CM | POA: Diagnosis not present

## 2019-09-08 ENCOUNTER — Other Ambulatory Visit: Payer: Self-pay

## 2019-09-08 DIAGNOSIS — R609 Edema, unspecified: Secondary | ICD-10-CM | POA: Diagnosis not present

## 2019-09-08 DIAGNOSIS — R52 Pain, unspecified: Secondary | ICD-10-CM | POA: Diagnosis not present

## 2019-09-08 DIAGNOSIS — W19XXXA Unspecified fall, initial encounter: Secondary | ICD-10-CM | POA: Diagnosis not present

## 2019-09-08 DIAGNOSIS — R0902 Hypoxemia: Secondary | ICD-10-CM | POA: Diagnosis not present

## 2019-09-08 DIAGNOSIS — Z743 Need for continuous supervision: Secondary | ICD-10-CM | POA: Diagnosis not present

## 2019-09-09 ENCOUNTER — Inpatient Hospital Stay: Payer: Medicare Other

## 2019-09-09 ENCOUNTER — Other Ambulatory Visit: Payer: Self-pay

## 2019-09-09 ENCOUNTER — Inpatient Hospital Stay: Admit: 2019-09-09 | Payer: Medicare Other

## 2019-09-09 ENCOUNTER — Encounter: Payer: Self-pay | Admitting: Internal Medicine

## 2019-09-09 ENCOUNTER — Emergency Department: Payer: Medicare Other

## 2019-09-09 ENCOUNTER — Inpatient Hospital Stay
Admission: EM | Admit: 2019-09-09 | Discharge: 2019-09-13 | DRG: 563 | Disposition: A | Discharge status: Skilled Nursing Facility | Payer: Medicare Other | Attending: Internal Medicine | Admitting: Internal Medicine

## 2019-09-09 DIAGNOSIS — E876 Hypokalemia: Secondary | ICD-10-CM | POA: Diagnosis not present

## 2019-09-09 DIAGNOSIS — I10 Essential (primary) hypertension: Secondary | ICD-10-CM | POA: Diagnosis present

## 2019-09-09 DIAGNOSIS — Z955 Presence of coronary angioplasty implant and graft: Secondary | ICD-10-CM | POA: Diagnosis not present

## 2019-09-09 DIAGNOSIS — D72829 Elevated white blood cell count, unspecified: Secondary | ICD-10-CM | POA: Diagnosis not present

## 2019-09-09 DIAGNOSIS — G4700 Insomnia, unspecified: Secondary | ICD-10-CM | POA: Diagnosis not present

## 2019-09-09 DIAGNOSIS — I2510 Atherosclerotic heart disease of native coronary artery without angina pectoris: Secondary | ICD-10-CM | POA: Diagnosis present

## 2019-09-09 DIAGNOSIS — I25118 Atherosclerotic heart disease of native coronary artery with other forms of angina pectoris: Secondary | ICD-10-CM | POA: Diagnosis present

## 2019-09-09 DIAGNOSIS — E860 Dehydration: Secondary | ICD-10-CM | POA: Diagnosis not present

## 2019-09-09 DIAGNOSIS — M109 Gout, unspecified: Secondary | ICD-10-CM | POA: Diagnosis not present

## 2019-09-09 DIAGNOSIS — S8391XD Sprain of unspecified site of right knee, subsequent encounter: Secondary | ICD-10-CM | POA: Diagnosis not present

## 2019-09-09 DIAGNOSIS — W19XXXA Unspecified fall, initial encounter: Secondary | ICD-10-CM | POA: Diagnosis present

## 2019-09-09 DIAGNOSIS — L89151 Pressure ulcer of sacral region, stage 1: Secondary | ICD-10-CM | POA: Diagnosis not present

## 2019-09-09 DIAGNOSIS — G459 Transient cerebral ischemic attack, unspecified: Secondary | ICD-10-CM | POA: Diagnosis present

## 2019-09-09 DIAGNOSIS — I472 Ventricular tachycardia, unspecified: Secondary | ICD-10-CM

## 2019-09-09 DIAGNOSIS — N1831 Chronic kidney disease, stage 3a: Secondary | ICD-10-CM

## 2019-09-09 DIAGNOSIS — Z8673 Personal history of transient ischemic attack (TIA), and cerebral infarction without residual deficits: Secondary | ICD-10-CM | POA: Diagnosis not present

## 2019-09-09 DIAGNOSIS — Z800 Family history of malignant neoplasm of digestive organs: Secondary | ICD-10-CM

## 2019-09-09 DIAGNOSIS — S8391XA Sprain of unspecified site of right knee, initial encounter: Secondary | ICD-10-CM | POA: Diagnosis not present

## 2019-09-09 DIAGNOSIS — I480 Paroxysmal atrial fibrillation: Secondary | ICD-10-CM | POA: Diagnosis present

## 2019-09-09 DIAGNOSIS — H409 Unspecified glaucoma: Secondary | ICD-10-CM | POA: Diagnosis not present

## 2019-09-09 DIAGNOSIS — L8945 Pressure ulcer of contiguous site of back, buttock and hip, unstageable: Secondary | ICD-10-CM | POA: Diagnosis not present

## 2019-09-09 DIAGNOSIS — I130 Hypertensive heart and chronic kidney disease with heart failure and stage 1 through stage 4 chronic kidney disease, or unspecified chronic kidney disease: Secondary | ICD-10-CM | POA: Diagnosis present

## 2019-09-09 DIAGNOSIS — I4891 Unspecified atrial fibrillation: Secondary | ICD-10-CM | POA: Diagnosis present

## 2019-09-09 DIAGNOSIS — I252 Old myocardial infarction: Secondary | ICD-10-CM | POA: Diagnosis not present

## 2019-09-09 DIAGNOSIS — M25461 Effusion, right knee: Secondary | ICD-10-CM | POA: Diagnosis not present

## 2019-09-09 DIAGNOSIS — E785 Hyperlipidemia, unspecified: Secondary | ICD-10-CM | POA: Diagnosis not present

## 2019-09-09 DIAGNOSIS — Z96651 Presence of right artificial knee joint: Secondary | ICD-10-CM | POA: Diagnosis not present

## 2019-09-09 DIAGNOSIS — M25561 Pain in right knee: Secondary | ICD-10-CM | POA: Diagnosis present

## 2019-09-09 DIAGNOSIS — L8990 Pressure ulcer of unspecified site, unspecified stage: Secondary | ICD-10-CM | POA: Insufficient documentation

## 2019-09-09 DIAGNOSIS — I5022 Chronic systolic (congestive) heart failure: Secondary | ICD-10-CM | POA: Diagnosis not present

## 2019-09-09 DIAGNOSIS — I444 Left anterior fascicular block: Secondary | ICD-10-CM | POA: Diagnosis present

## 2019-09-09 DIAGNOSIS — Y92009 Unspecified place in unspecified non-institutional (private) residence as the place of occurrence of the external cause: Secondary | ICD-10-CM | POA: Diagnosis not present

## 2019-09-09 DIAGNOSIS — M2550 Pain in unspecified joint: Secondary | ICD-10-CM | POA: Diagnosis not present

## 2019-09-09 DIAGNOSIS — I420 Dilated cardiomyopathy: Secondary | ICD-10-CM | POA: Diagnosis not present

## 2019-09-09 DIAGNOSIS — N179 Acute kidney failure, unspecified: Secondary | ICD-10-CM | POA: Diagnosis present

## 2019-09-09 DIAGNOSIS — Z8042 Family history of malignant neoplasm of prostate: Secondary | ICD-10-CM

## 2019-09-09 DIAGNOSIS — S79921A Unspecified injury of right thigh, initial encounter: Secondary | ICD-10-CM | POA: Diagnosis not present

## 2019-09-09 DIAGNOSIS — R531 Weakness: Secondary | ICD-10-CM

## 2019-09-09 DIAGNOSIS — Z20822 Contact with and (suspected) exposure to covid-19: Secondary | ICD-10-CM | POA: Diagnosis present

## 2019-09-09 DIAGNOSIS — R55 Syncope and collapse: Secondary | ICD-10-CM

## 2019-09-09 DIAGNOSIS — Z8719 Personal history of other diseases of the digestive system: Secondary | ICD-10-CM | POA: Diagnosis not present

## 2019-09-09 DIAGNOSIS — Z7401 Bed confinement status: Secondary | ICD-10-CM | POA: Diagnosis not present

## 2019-09-09 DIAGNOSIS — W1839XA Other fall on same level, initial encounter: Secondary | ICD-10-CM | POA: Diagnosis present

## 2019-09-09 DIAGNOSIS — R5381 Other malaise: Secondary | ICD-10-CM | POA: Diagnosis not present

## 2019-09-09 DIAGNOSIS — Z66 Do not resuscitate: Secondary | ICD-10-CM | POA: Diagnosis not present

## 2019-09-09 DIAGNOSIS — G4733 Obstructive sleep apnea (adult) (pediatric): Secondary | ICD-10-CM | POA: Diagnosis not present

## 2019-09-09 DIAGNOSIS — Z8249 Family history of ischemic heart disease and other diseases of the circulatory system: Secondary | ICD-10-CM

## 2019-09-09 DIAGNOSIS — R7989 Other specified abnormal findings of blood chemistry: Secondary | ICD-10-CM | POA: Diagnosis not present

## 2019-09-09 DIAGNOSIS — Z9181 History of falling: Secondary | ICD-10-CM

## 2019-09-09 DIAGNOSIS — Z87891 Personal history of nicotine dependence: Secondary | ICD-10-CM

## 2019-09-09 DIAGNOSIS — Z888 Allergy status to other drugs, medicaments and biological substances status: Secondary | ICD-10-CM

## 2019-09-09 DIAGNOSIS — R262 Difficulty in walking, not elsewhere classified: Secondary | ICD-10-CM

## 2019-09-09 DIAGNOSIS — I25119 Atherosclerotic heart disease of native coronary artery with unspecified angina pectoris: Secondary | ICD-10-CM | POA: Diagnosis not present

## 2019-09-09 DIAGNOSIS — Z79899 Other long term (current) drug therapy: Secondary | ICD-10-CM

## 2019-09-09 DIAGNOSIS — Z743 Need for continuous supervision: Secondary | ICD-10-CM | POA: Diagnosis not present

## 2019-09-09 DIAGNOSIS — I6523 Occlusion and stenosis of bilateral carotid arteries: Secondary | ICD-10-CM | POA: Diagnosis not present

## 2019-09-09 DIAGNOSIS — W19XXXD Unspecified fall, subsequent encounter: Secondary | ICD-10-CM | POA: Diagnosis not present

## 2019-09-09 DIAGNOSIS — S0990XA Unspecified injury of head, initial encounter: Secondary | ICD-10-CM | POA: Diagnosis not present

## 2019-09-09 LAB — URINALYSIS, COMPLETE (UACMP) WITH MICROSCOPIC
Bacteria, UA: NONE SEEN
Bilirubin Urine: NEGATIVE
Glucose, UA: NEGATIVE mg/dL
Hgb urine dipstick: NEGATIVE
Ketones, ur: NEGATIVE mg/dL
Leukocytes,Ua: NEGATIVE
Nitrite: NEGATIVE
Protein, ur: NEGATIVE mg/dL
Specific Gravity, Urine: 1.013 (ref 1.005–1.030)
Squamous Epithelial / HPF: NONE SEEN (ref 0–5)
pH: 5 (ref 5.0–8.0)

## 2019-09-09 LAB — COMPREHENSIVE METABOLIC PANEL
ALT: 22 U/L (ref 0–44)
AST: 29 U/L (ref 15–41)
Albumin: 3.9 g/dL (ref 3.5–5.0)
Alkaline Phosphatase: 142 U/L — ABNORMAL HIGH (ref 38–126)
Anion gap: 10 (ref 5–15)
BUN: 52 mg/dL — ABNORMAL HIGH (ref 8–23)
CO2: 26 mmol/L (ref 22–32)
Calcium: 8.7 mg/dL — ABNORMAL LOW (ref 8.9–10.3)
Chloride: 106 mmol/L (ref 98–111)
Creatinine, Ser: 2.6 mg/dL — ABNORMAL HIGH (ref 0.61–1.24)
GFR calc Af Amer: 27 mL/min — ABNORMAL LOW (ref >60)
GFR calc non Af Amer: 23 mL/min — ABNORMAL LOW (ref >60)
Glucose, Bld: 156 mg/dL — ABNORMAL HIGH (ref 70–99)
Potassium: 3.6 mmol/L (ref 3.5–5.1)
Sodium: 142 mmol/L (ref 135–145)
Total Bilirubin: 1.6 mg/dL — ABNORMAL HIGH (ref 0.3–1.2)
Total Protein: 7.3 g/dL (ref 6.5–8.1)

## 2019-09-09 LAB — SARS CORONAVIRUS 2 (TAT 6-24 HRS): SARS Coronavirus 2: NEGATIVE

## 2019-09-09 LAB — CBC
HCT: 39.5 % (ref 39.0–52.0)
Hemoglobin: 12.4 g/dL — ABNORMAL LOW (ref 13.0–17.0)
MCH: 29.2 pg (ref 26.0–34.0)
MCHC: 31.4 g/dL (ref 30.0–36.0)
MCV: 92.9 fL (ref 80.0–100.0)
Platelets: 180 10*3/uL (ref 150–400)
RBC: 4.25 MIL/uL (ref 4.22–5.81)
RDW: 17 % — ABNORMAL HIGH (ref 11.5–15.5)
WBC: 11.7 10*3/uL — ABNORMAL HIGH (ref 4.0–10.5)
nRBC: 0 % (ref 0.0–0.2)

## 2019-09-09 LAB — BRAIN NATRIURETIC PEPTIDE: B Natriuretic Peptide: 820 pg/mL — ABNORMAL HIGH (ref 0.0–100.0)

## 2019-09-09 LAB — TROPONIN I (HIGH SENSITIVITY)
Troponin I (High Sensitivity): 14 ng/L (ref <18)
Troponin I (High Sensitivity): 17 ng/L (ref <18)

## 2019-09-09 MED ORDER — MORPHINE SULFATE (PF) 2 MG/ML IV SOLN
2.0000 mg | Freq: Once | INTRAVENOUS | Status: AC
  Administered 2019-09-09: 07:00:00 2 mg via INTRAVENOUS
  Filled 2019-09-09: qty 1

## 2019-09-09 MED ORDER — ACETAMINOPHEN 650 MG RE SUPP: 650.0000 mg | Freq: Four times a day (QID) | RECTAL | Status: DC | PRN

## 2019-09-09 MED ORDER — ALLOPURINOL 100 MG PO TABS
300.0000 mg | ORAL_TABLET | Freq: Every day | ORAL | Status: DC
  Administered 2019-09-09 – 2019-09-13 (×5): 300 mg via ORAL
  Filled 2019-09-09 (×3): qty 3
  Filled 2019-09-09: qty 1
  Filled 2019-09-09 (×2): qty 3

## 2019-09-09 MED ORDER — SODIUM CHLORIDE 0.9 % IV SOLN: INTRAVENOUS | Status: DC

## 2019-09-09 MED ORDER — ISOSORBIDE MONONITRATE ER 30 MG PO TB24
30.0000 mg | ORAL_TABLET | Freq: Every day | ORAL | Status: DC
  Administered 2019-09-09 – 2019-09-13 (×5): 30 mg via ORAL
  Filled 2019-09-09 (×5): qty 1

## 2019-09-09 MED ORDER — ATORVASTATIN CALCIUM 20 MG PO TABS
20.0000 mg | ORAL_TABLET | Freq: Every day | ORAL | Status: DC
  Administered 2019-09-09 – 2019-09-12 (×4): 20 mg via ORAL
  Filled 2019-09-09 (×4): qty 1

## 2019-09-09 MED ORDER — OXYCODONE HCL 5 MG PO TABS
5.0000 mg | ORAL_TABLET | ORAL | Status: DC | PRN
  Administered 2019-09-09 – 2019-09-10 (×3): 5 mg via ORAL
  Filled 2019-09-09 (×3): qty 1

## 2019-09-09 MED ORDER — MORPHINE SULFATE (PF) 2 MG/ML IV SOLN
2.0000 mg | Freq: Once | INTRAVENOUS | Status: AC
  Administered 2019-09-09: 2 mg via INTRAVENOUS
  Filled 2019-09-09: qty 1

## 2019-09-09 MED ORDER — CARVEDILOL 25 MG PO TABS
25.0000 mg | ORAL_TABLET | Freq: Two times a day (BID) | ORAL | Status: DC
  Administered 2019-09-09 – 2019-09-13 (×9): 25 mg via ORAL
  Filled 2019-09-09 (×9): qty 1

## 2019-09-09 MED ORDER — FUROSEMIDE 40 MG PO TABS: 40.0000 mg | ORAL_TABLET | Freq: Two times a day (BID) | ORAL | Status: DC

## 2019-09-09 MED ORDER — ENOXAPARIN SODIUM 30 MG/0.3ML ~~LOC~~ SOLN
30.0000 mg | SUBCUTANEOUS | Status: DC
  Administered 2019-09-09: 08:00:00 30 mg via SUBCUTANEOUS
  Filled 2019-09-09 (×2): qty 0.3

## 2019-09-09 MED ORDER — HYDRALAZINE HCL 50 MG PO TABS: 25.0000 mg | ORAL_TABLET | Freq: Three times a day (TID) | ORAL | Status: DC | PRN

## 2019-09-09 MED ORDER — AMIODARONE HCL 200 MG PO TABS
200.0000 mg | ORAL_TABLET | Freq: Every day | ORAL | Status: DC
  Administered 2019-09-09 – 2019-09-13 (×5): 200 mg via ORAL
  Filled 2019-09-09 (×5): qty 1

## 2019-09-09 MED ORDER — ACETAMINOPHEN 325 MG PO TABS: 650.0000 mg | ORAL_TABLET | Freq: Four times a day (QID) | ORAL | Status: DC | PRN

## 2019-09-09 MED ORDER — MONTELUKAST SODIUM 10 MG PO TABS
10.0000 mg | ORAL_TABLET | Freq: Every day | ORAL | Status: DC
  Administered 2019-09-09 – 2019-09-12 (×4): 10 mg via ORAL
  Filled 2019-09-09 (×5): qty 1

## 2019-09-09 MED ORDER — ONDANSETRON HCL 4 MG/2ML IJ SOLN: 4.0000 mg | Freq: Four times a day (QID) | INTRAMUSCULAR | Status: DC | PRN

## 2019-09-09 MED ORDER — TRAZODONE HCL 50 MG PO TABS
25.0000 mg | ORAL_TABLET | Freq: Every evening | ORAL | Status: DC | PRN
  Administered 2019-09-10: 25 mg via ORAL
  Filled 2019-09-09: qty 1

## 2019-09-09 MED ORDER — TORSEMIDE 20 MG PO TABS: 40.0000 mg | ORAL_TABLET | Freq: Every day | ORAL | Status: DC

## 2019-09-09 MED ORDER — MAGNESIUM HYDROXIDE 400 MG/5ML PO SUSP
30.0000 mL | Freq: Every day | ORAL | Status: DC | PRN
  Filled 2019-09-09: qty 30

## 2019-09-09 MED ORDER — TIMOLOL MALEATE 0.5 % OP SOLN
1.0000 [drp] | Freq: Two times a day (BID) | OPHTHALMIC | Status: DC
  Administered 2019-09-09 – 2019-09-13 (×9): 1 [drp] via OPHTHALMIC
  Filled 2019-09-09 (×2): qty 5

## 2019-09-09 MED ORDER — ONDANSETRON HCL 4 MG PO TABS: 4.0000 mg | ORAL_TABLET | Freq: Four times a day (QID) | ORAL | Status: DC | PRN

## 2019-09-09 MED ORDER — POTASSIUM CHLORIDE CRYS ER 20 MEQ PO TBCR: 20.0000 meq | EXTENDED_RELEASE_TABLET | Freq: Three times a day (TID) | ORAL | Status: DC

## 2019-09-09 MED ORDER — HYDRALAZINE HCL 50 MG PO TABS
25.0000 mg | ORAL_TABLET | Freq: Three times a day (TID) | ORAL | Status: DC
  Administered 2019-09-09 – 2019-09-13 (×13): 25 mg via ORAL
  Filled 2019-09-09 (×13): qty 1

## 2019-09-09 NOTE — ED Notes (Signed)
This RN to bedside, pt repositioned in bed with the help of physical therapy. Pt tolerated well.

## 2019-09-09 NOTE — Consult Note (Signed)
[facility] Consultation Note  Patient ID: Larry Lawrence, MRN: [id number], DOB/AGE: [birth year] 79 y.o. Admit date: 09/09/2019   Date of Consult: 09/09/2019 Primary Physician: Duanne Limerick, MD Primary Cardiologist: Gwen Pounds   Chief Complaint:  Chief Complaint  Patient presents with  . Fall   Reason for Consult: Atrial fibrillation  HPI: 79 y.o. male with known dilated cardiomyopathy with chronic systolic dysfunction heart failure with ejection fraction of 30% chronic kidney disease stage III coronary atherosclerosis with stable angina hyperlipidemia and paroxysmal nonvalvular atrial fibrillation for which the patient has had some weakness fatigue and a fall.  The patient did fall and hurt himself a bit but did not have any fractures.  When seen in the emergency room the patient did have an chest x-ray which showed no evidence of congestive heart failure or pulmonary edema worsening chronic kidney disease EKG showing atrial fibrillation at heart rate of 86 bpm with left anterior fascicular block.  The patient previously has been on amiodarone for atrial fibrillation as well as [place] fatal all for cardiomyopathy he is not been on anticoagulation due to bleeding complications in the recent past.  The patient currently is stable at this time with no evidence of congestive heart failure or anginal symptoms today  Past Medical History:  Diagnosis Date  . Allergy   . CHF (congestive heart failure) (HCC)   . Coronary artery disease   . Gout   . Insomnia   . Renal disorder   . Sleeps in sitting position due to orthopnea   . TIA (transient ischemic attack)       Surgical History:  Past Surgical History:  Procedure Laterality Date  . CARDIAC CATHETERIZATION N/A 02/02/2016   Procedure: Left Heart Cath and Coronary Angiography;  Surgeon: Lamar Blinks, MD;  Location: [facility] INVASIVE CV LAB;  Service: Cardiovascular;  Laterality: N/A;  . CARDIAC SURGERY     3 stents in "main  artery"  . JOINT REPLACEMENT       Home Meds: Prior to Admission medications   Medication Sig Start Date End Date Taking? Authorizing Provider  allopurinol (ZYLOPRIM) 300 MG tablet TAKE (1) TABLET BY MOUTH a day 06/09/19  Yes Duanne Limerick, MD  amiodarone (PACERONE) 200 MG tablet Take 200 mg by mouth daily. Dr Suzzanne Cloud, MD  atorvastatin (LIPITOR) 20 MG tablet Take 20 mg by mouth daily.  01/05/18 09/09/19 Yes [provider]  carvedilol (COREG) 12.5 MG tablet Take 1 tablet (12.5 mg total) by mouth 2 (two) times daily for 30 days. Patient taking differently: Take 25 mg by mouth 2 (two) times daily. Dr Gwen Pounds 07/22/18 09/09/19 Yes Enid Baas, Jude, MD  hydrALAZINE (APRESOLINE) 25 MG tablet Take 2 tablets (50 mg total) by mouth every 8 (eight) hours. Patient taking differently: Take 25 mg by mouth every 8 (eight) hours. Dr Gwen Pounds 07/22/18  Yes Enid Baas, Jude, MD  isosorbide mononitrate (IMDUR) 30 MG 24 hr tablet Take 30 mg by mouth daily. Rondo Spittler   Yes [provider]  montelukast (SINGULAIR) 10 MG tablet TAKE (1) TABLET BY MOUTH DAILY AT BEDTIME 06/09/19  Yes Duanne Limerick, MD  potassium chloride SA (K-DUR,KLOR-CON) 20 MEQ tablet Take 1 tablet (20 mEq total) by mouth 2 (two) times daily. Patient taking differently: Take 20 mEq by mouth 2 (two) times daily. Dr Gwen Pounds 07/22/18  Yes Enid Baas, Jude, MD  timolol (TIMOPTIC) 0.5 % ophthalmic solution Place 1 drop into both eyes 2 (two) times  daily. Dr Inez Pilgrim   Yes [provider]  torsemide (DEMADEX) 20 MG tablet Take 40 mg by mouth daily. Dr Tamera Punt 05/24/19  Yes [provider]  furosemide (LASIX) 40 MG tablet Take 1 tablet (40 mg total) by mouth 2 (two) times daily for 30 days. Patient not taking: Reported on 09/09/2019 07/22/18 09/07/18  Jama Flavors, MD  mupirocin ointment (BACTROBAN) 2 % Apply 1 application topically 2 (two) times daily. Patient not taking: Reported on 09/09/2019 06/09/19   Duanne Limerick, MD    Inpatient Medications:  . allopurinol  300 mg Oral Daily  . amiodarone  200 mg Oral Daily  . atorvastatin  20 mg Oral Daily  . carvedilol  25 mg Oral BID  . enoxaparin (LOVENOX) injection  30 mg Subcutaneous Q24H  . hydrALAZINE  25 mg Oral Q8H  . isosorbide mononitrate  30 mg Oral Daily  . montelukast  10 mg Oral QHS  . timolol  1 drop Both Eyes BID   . sodium chloride 50 mL/hr at 09/09/19 1145    Allergies:  Allergies  Allergen Reactions  . Niaspan [Niacin Er] Other (See Comments)    Reaction:  Hot flashes     Social History   Socioeconomic History  . Marital status: Married    Spouse name: Not on file  . Number of children: 2  . Years of education: Not on file  . Highest education level: 12th grade  Occupational History  . Occupation: retired  Tobacco Use  . Smoking status: Former Smoker    Packs/day: 2.00    Years: 3.00    Pack years: 6.00    Types: Cigarettes    Quit date: 1960    Years since quitting: 61.2  . Smokeless tobacco: Never Used  . Tobacco comment: Smoking cessation materials not required  Substance and Sexual Activity  . Alcohol use: No  . Drug use: No  . Sexual activity: Not Currently  Other Topics Concern  . Not on file  Social History Narrative  . Not on file   Social Determinants of Health   Financial Resource Strain:   . Difficulty of Paying Living Expenses:   Food Insecurity:   . Worried About Programme researcher, broadcasting/film/video in the Last Year:   . Barista in the Last Year:   Transportation Needs:   . Freight forwarder (Medical):   Marland Kitchen Lack of Transportation (Non-Medical):   Physical Activity:   . Days of Exercise per Week:   . Minutes of Exercise per Session:   Stress:   . Feeling of Stress :   Social Connections:   . Frequency of Communication with Friends and Family:   . Frequency of Social Gatherings with Friends and Family:   . Attends Religious Services:   . Active Member of Clubs or Organizations:   . Attends  Banker Meetings:   Marland Kitchen Marital Status:   Intimate Partner Violence:   . Fear of Current or Ex-Partner:   . Emotionally Abused:   Marland Kitchen Physically Abused:   . Sexually Abused:      Family History  Problem Relation Age of Onset  . Hypertension Mother   . Pancreatic cancer Mother   . Hypertension Father   . Prostate cancer Father      Review of Systems Positive for fall shortness of breath Negative for: General:  chills, fever, night sweats or weight changes.  Cardiovascular: PND orthopnea syncope dizziness  Dermatological skin lesions rashes  Respiratory: Cough congestion Urologic: Frequent urination urination at night and hematuria Abdominal: negative for nausea, vomiting, diarrhea, bright red blood per rectum, melena, or hematemesis Neurologic: negative for visual changes, and/or hearing changes  All other systems reviewed and are otherwise negative except as noted above.  Labs: No results for input(s): CKTOTAL, CKMB, TROPONINI in the last 72 hours. Lab Results  Component Value Date   WBC 11.7 (H) 09/09/2019   HGB 12.4 (L) 09/09/2019   HCT 39.5 09/09/2019   MCV 92.9 09/09/2019   PLT 180 09/09/2019    Recent Labs  Lab 09/09/19 0320  NA 142  K 3.6  CL 106  CO2 26  BUN 52*  CREATININE 2.60*  CALCIUM 8.7*  PROT 7.3  BILITOT 1.6*  ALKPHOS 142*  ALT 22  AST 29  GLUCOSE 156*   No results found for: CHOL, HDL, LDLCALC, TRIG No results found for: DDIMER  Radiology/Studies:  CT Head Wo Contrast  Result Date: 09/09/2019 CLINICAL DATA:  Fall and leg weakness EXAM: CT HEAD WITHOUT CONTRAST TECHNIQUE: Contiguous axial images were obtained from the base of the skull through the vertex without intravenous contrast. COMPARISON:  06/21/2018 FINDINGS: Brain: No evidence of acute infarction, hemorrhage, hydrocephalus, extra-axial collection or mass lesion/mass effect. Chronic small vessel ischemic gliosis that is confluent in the hemispheric white matter. 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 Brain atrophy with ventriculomegaly. Vascular: No hyperdense vessel or unexpected calcification. Skull: Negative for fracture. Sinuses/Orbits: Sclerotic mastoids from remote inflammation. High riding jugular bulb and or diverticulum on the right. IMPRESSION: 1. No acute finding or change from 2019. 2. Atrophy and chronic small vessel ischemia. Electronically Signed   By: Marnee Spring M.D.   On: 09/09/2019 06:37   CT Knee Right Wo Contrast  Result Date: 09/09/2019 CLINICAL DATA:  Larey Seat. Right knee pain. History of recent total knee arthroplasty. EXAM: CT OF THE right KNEE WITHOUT CONTRAST TECHNIQUE: Multidetector CT imaging of the right knee was performed according to the standard protocol. Multiplanar CT image reconstructions were also generated. COMPARISON:  Radiographs 09/09/2019 FINDINGS: The total knee arthroplasty components are intact. No acute periprosthetic fracture is identified. Small knee joint effusion is noted. Advanced atherosclerotic calcifications involving the popliteal artery and branch vessels. The quadriceps and patellar tendons are intact. Mild fatty atrophy of the knee musculature. IMPRESSION: 1. Intact total knee arthroplasty components. No acute periprosthetic fracture. 2. Small knee joint effusion. 3. Advanced atherosclerotic calcifications involving the popliteal artery and branch vessels. Electronically Signed   By: Rudie Meyer M.D.   On: 09/09/2019 05:08   MR BRAIN WO CONTRAST  Result Date: 09/09/2019 CLINICAL DATA:  TIA with right leg weakness EXAM: MRI HEAD WITHOUT CONTRAST TECHNIQUE: Multiplanar, multiecho pulse sequences of the brain and surrounding structures were obtained without intravenous contrast. COMPARISON:  CT head 09/09/2019 FINDINGS: Brain: Negative for acute infarct. Moderate atrophy and moderate chronic microvascular ischemic change. Chronic ischemic changes are  present throughout the cerebral white matter. Chronic ischemia in the basal ganglia bilaterally and right cerebellum. Chronic microhemorrhage right thalamus and central pons. No mass or hydrocephalus identified. Vascular: Normal arterial flow voids Skull and upper cervical spine: No focal skeletal lesion Sinuses/Orbits: Mild mucosal edema paranasal sinuses. Bilateral cataract extraction Other: None IMPRESSION: Negative for acute infarct Moderate atrophy and moderate chronic  ischemia. Electronically Signed   By: Marlan Palau M.D.   On: 09/09/2019 08:27   DG Knee Complete 4 Views Right  Result Date: 09/09/2019  CLINICAL DATA:  Pain EXAM: RIGHT KNEE - COMPLETE 4+ VIEW COMPARISON:  02/27/2011 FINDINGS: The patient is status post prior total knee. There is no acute displaced fracture. No dislocation. There is osteopenia. Vascular calcifications are noted. Nonspecific edema is noted. IMPRESSION: Negative. Electronically Signed   By: Constance Holster M.D.   On: 09/09/2019 01:19   DG Femur Portable Min 2 Views Right  Result Date: 09/09/2019 CLINICAL DATA:  Pain status post fall EXAM: RIGHT FEMUR PORTABLE 2 VIEW COMPARISON:  None. FINDINGS: Patient is status post prior total knee arthroplasty. There is nonspecific lower extremity edema. Vascular calcifications are noted. There is no acute displaced fracture or dislocation involving the right femur. IMPRESSION: 1. No acute displaced fracture or dislocation. 2. Status post total knee arthroplasty. 3. There is nonspecific lower extremity edema. Electronically Signed   By: Constance Holster M.D.   On: 09/09/2019 03:53    EKG: Atrial fibrillation with controlled ventricular rate and left anterior fascicular block  Weights: Filed Weights   09/09/19 0031  Weight: 93.4 kg     Physical Exam: Blood pressure 121/79, pulse 73, temperature 98.9 F (37.2 C), temperature source Oral, resp. rate 20, height 5\' 11"  (1.803 m), weight 93.4 kg, SpO2 94 %. Body mass  index is 28.73 kg/m. General: Well developed, well nourished, in no acute distress. Head eyes ears nose throat: Normocephalic, atraumatic, sclera non-icteric, no xanthomas, nares are without discharge. No apparent thyromegaly and/or mass  Lungs: Normal respiratory effort.  no wheezes, no rales, no rhonchi.  Heart: Irregular with normal S1 S2.  2+ apical murmur gallop, no rub, PMI is normal size and placement, carotid upstroke normal without bruit, jugular venous pressure is normal Abdomen: Soft, non-tender, non-distended with normoactive bowel sounds. No hepatomegaly. No rebound/guarding. No obvious abdominal masses. Abdominal aorta is normal size without bruit Extremities: 1-2+ edema. no cyanosis, no clubbing, no ulcers  Peripheral : 2+ bilateral upper extremity pulses, 2+ bilateral femoral pulses, 2+ bilateral dorsal pedal pulse Neuro: Alert and oriented. No facial asymmetry. No focal deficit. Moves all extremities spontaneously. Musculoskeletal: Normal muscle tone without kyphosis Psych:  Responds to questions appropriately with a normal affect.    Assessment: 79 year old male with coronary artery disease status post previous myocardial infarction's cardiomyopathy with LV dysfunction with chronic systolic dysfunction congestive heart failure chronic kidney disease and paroxysmal nonvalvular atrial fibrillation with controlled ventricular rate after a fall on appropriate medication management without evidence of angina myocardial infarction or heart failure at this time  Plan: 1.  Continue evaluation of causes of fall including weakness fatigue rhythm disturbances with telemetry 2.  Continue amiodarone at current dose and will consider adjustments of medication management to improve heart rate control and blood pressure control and maintenance of normal rhythm 3.  No anticoagulation at this time due to significant fall risk and bleeding complications from previous history 4.  High intensity  cholesterol therapy 5.  Cardiomyopathy treatment with carvedilol isosorbide hydralazine combination 6.  Continue to consider diuretic if necessary but watching closely for chronic kidney disease 7.  Further diagnostic testing and treatment options after above  Signed, Corey Skains M.D. [facility] 09/09/2019, 5:53 PM

## 2019-09-09 NOTE — ED Notes (Signed)
Attempted to call report x 1  

## 2019-09-09 NOTE — ED Notes (Signed)
Pt transported to CT ?

## 2019-09-09 NOTE — ED Notes (Signed)
Daughter [name] was called; update provided [phone]

## 2019-09-09 NOTE — H&P (Addendum)
History and Physical    Larry Lawrence JHE:[id number] DOB: [birth year] DOA: 09/09/2019  Referring MD/NP/PA:   PCP: Duanne Limerick, MD   Patient coming from:  The patient is coming from home.  At baseline, pt is independent for most of ADL.        Chief Complaint: weakness and right knee pain  HPI: Larry Lawrence is a 79 y.o. male with medical history significant of hypertension, hyperlipidemia, CAD, TIA, gout, sCHF with EF 25-30%, CKD-3a, CAD, stent placement, V. tach, GI bleeding, who presents with generalized weakness and the right knee pain.  Patient states that he has generalized weakness, particularly in both legs in the past several days.  He had history of right knee replacement, now he has right knee pain, which is constant, sharp, severe, nonradiating. He states that he slid onto the floor. No significant injury.  No head or neck pain.  Patient does not have dizziness, lightheadedness.  No unilateral numbness or tingling his extremities.  No facial droop or slurred speech.  He has chronic mild shortness of breath due to CHF, which has not changed.  Denies chest pain, cough, fever or chills.  No nausea, vomiting, diarrhea, abdominal pain, symptoms of UTI.  ED Course: pt was found to have WBC 11.7, troponin 14, 17, negative urinalysis, pending Covid PCR, worsening renal function, temperature normal, blood pressure 149/99, heart rate 111, RR 21, oxygen sat 92 to 96% on room air.  CT of head is negative for acute intracranial abnormalities.  MRI of the brain is negative for stroke.  X-ray of right femur and CT of right knee showed intact total knee arthroplasty components and no acute periprosthetic fracture. Patient is admitted to telemetry bed as inpatient.  Review of Systems:   General: no fevers, chills, no body weight gain, has fatigue HEENT: no blurry vision, hearing changes or sore throat Respiratory: has dyspnea, no coughing, wheezing CV: no chest pain, no palpitations GI: no  nausea, vomiting, abdominal pain, diarrhea, constipation GU: no dysuria, burning on urination, increased urinary frequency, hematuria  Ext: has leg edema Neuro: no unilateral weakness, numbness, or tingling, no vision change or hearing loss. Has fall. Skin: no rash, no skin tear. MSK: has right knee pain. Heme: No easy bruising.  Travel history: No recent long distant travel.  Allergy:  Allergies  Allergen Reactions  . Niaspan [Niacin Er] Other (See Comments)    Reaction:  Hot flashes     Past Medical History:  Diagnosis Date  . Allergy   . CHF (congestive heart failure) (HCC)   . Coronary artery disease   . Gout   . Insomnia   . Renal disorder   . Sleeps in sitting position due to orthopnea   . TIA (transient ischemic attack)     Past Surgical History:  Procedure Laterality Date  . CARDIAC CATHETERIZATION N/A 02/02/2016   Procedure: Left Heart Cath and Coronary Angiography;  Surgeon: Lamar Blinks, MD;  Location: [facility] INVASIVE CV LAB;  Service: Cardiovascular;  Laterality: N/A;  . CARDIAC SURGERY     3 stents in "main artery"  . JOINT REPLACEMENT      Social History:  reports that he quit smoking about 61 years ago. His smoking use included cigarettes. He has a 6.00 pack-year smoking history. He has never used smokeless tobacco. He reports that he does not drink alcohol or use drugs.  Family History:  Family History  Problem Relation Age of Onset  . Hypertension Mother   .  Pancreatic cancer Mother   . Hypertension Father   . Prostate cancer Father      Prior to Admission medications   Medication Sig Start Date End Date Taking? Authorizing Provider  allopurinol (ZYLOPRIM) 300 MG tablet TAKE (1) TABLET BY MOUTH a day 06/09/19   Juline Patch, MD  amiodarone (PACERONE) 200 MG tablet Take 200 mg by mouth daily. Dr Roxan Hockey, Everlean Cherry, MD  atorvastatin (LIPITOR) 20 MG tablet Take 20 mg by mouth daily.  01/05/18 06/09/19  [provider]  carvedilol  (COREG) 12.5 MG tablet Take 1 tablet (12.5 mg total) by mouth 2 (two) times daily for 30 days. Patient taking differently: Take 25 mg by mouth 2 (two) times daily. Dr Nehemiah Massed 07/22/18 06/09/19  Otila Back, MD  furosemide (LASIX) 40 MG tablet Take 1 tablet (40 mg total) by mouth 2 (two) times daily for 30 days. Patient taking differently: Take 40 mg by mouth 2 (two) times daily. Dr Nehemiah Massed 07/22/18 09/07/18  Otila Back, MD  hydrALAZINE (APRESOLINE) 25 MG tablet Take 2 tablets (50 mg total) by mouth every 8 (eight) hours. Patient taking differently: Take 25 mg by mouth every 8 (eight) hours. Dr Nehemiah Massed 07/22/18   Stark Jock Jude, MD  isosorbide mononitrate (IMDUR) 30 MG 24 hr tablet Take 30 mg by mouth daily. kowalski    [provider]  montelukast (SINGULAIR) 10 MG tablet TAKE (1) TABLET BY MOUTH DAILY AT BEDTIME 06/09/19   Juline Patch, MD  mupirocin ointment (BACTROBAN) 2 % Apply 1 application topically 2 (two) times daily. 06/09/19   Juline Patch, MD  potassium chloride SA (K-DUR,KLOR-CON) 20 MEQ tablet Take 1 tablet (20 mEq total) by mouth 2 (two) times daily. Patient taking differently: Take 20 mEq by mouth 3 (three) times daily. Dr Nehemiah Massed 07/22/18   Stark Jock, Jude, MD  timolol (TIMOPTIC) 0.5 % ophthalmic solution Place 1 drop into both eyes 2 (two) times daily. Dr Wallace Going    [provider]  torsemide (DEMADEX) 20 MG tablet Take 2 tablets by mouth daily. Dr Barrington Ellison 05/24/19   [provider]    Physical Exam: Vitals:   09/09/19 0643 09/09/19 0825 09/09/19 0830 09/09/19 0900  BP: (!) 149/99 (!) 139/98 (!) 135/96 (!) 131/91  Pulse: (!) 111  (!) 109 72  Resp: 19  18 13   Temp:      TempSrc:      SpO2: 92%  94% 93%  Weight:      Height:       General: Not in acute distress HEENT:       Eyes: PERRL, EOMI, no scleral icterus.       ENT: No discharge from the ears and nose, no pharynx injection, no tonsillar enlargement.        Neck: No JVD, no bruit, no mass  felt. Heme: No neck lymph node enlargement. Cardiac: S1/S2, irregularly irregular rhythm, No murmurs, No gallops or rubs. Respiratory: No rales, wheezing, rhonchi or rubs. GI: Soft, nondistended, nontender, no rebound pain, no organomegaly, BS present. GU: No hematuria Ext: 1+ pitting leg edema bilaterally. 2+DP/PT pulse bilaterally. Musculoskeletal: has right knee tenderness without erythema or warmth. Skin: No rashes.  Neuro: Alert, oriented X3, cranial nerves II-XII grossly intact, moves all extremities. Psych: Patient is not psychotic, no suicidal or hemocidal ideation.  Labs on Admission: I have personally reviewed following labs and imaging studies  CBC: Recent Labs  Lab 09/09/19 0320  WBC 11.7*  HGB 12.4*  HCT 39.5  MCV 92.9  PLT 180   Basic Metabolic Panel: Recent Labs  Lab 09/09/19 0320  NA 142  K 3.6  CL 106  CO2 26  GLUCOSE 156*  BUN 52*  CREATININE 2.60*  CALCIUM 8.7*   GFR: Estimated Creatinine Clearance: 28.2 mL/min (A) (by C-G formula based on SCr of 2.6 mg/dL (H)). Liver Function Tests: Recent Labs  Lab 09/09/19 0320  AST 29  ALT 22  ALKPHOS 142*  BILITOT 1.6*  PROT 7.3  ALBUMIN 3.9   No results for input(s): LIPASE, AMYLASE in the last 168 hours. No results for input(s): AMMONIA in the last 168 hours. Coagulation Profile: No results for input(s): INR, PROTIME in the last 168 hours. Cardiac Enzymes: No results for input(s): CKTOTAL, CKMB, CKMBINDEX, TROPONINI in the last 168 hours. BNP (last 3 results) No results for input(s): PROBNP in the last 8760 hours. HbA1C: No results for input(s): HGBA1C in the last 72 hours. CBG: No results for input(s): GLUCAP in the last 168 hours. Lipid Profile: No results for input(s): CHOL, HDL, LDLCALC, TRIG, CHOLHDL, LDLDIRECT in the last 72 hours. Thyroid Function Tests: No results for input(s): TSH, T4TOTAL, FREET4, T3FREE, THYROIDAB in the last 72 hours. Anemia Panel: No results for input(s):  VITAMINB12, FOLATE, FERRITIN, TIBC, IRON, RETICCTPCT in the last 72 hours. Urine analysis:    Component Value Date/Time   COLORURINE YELLOW (A) 09/09/2019 0535   APPEARANCEUR CLEAR (A) 09/09/2019 0535   LABSPEC 1.013 09/09/2019 0535   PHURINE 5.0 09/09/2019 0535   GLUCOSEU NEGATIVE 09/09/2019 0535   HGBUR NEGATIVE 09/09/2019 0535   BILIRUBINUR NEGATIVE 09/09/2019 0535   KETONESUR NEGATIVE 09/09/2019 0535   PROTEINUR NEGATIVE 09/09/2019 0535   NITRITE NEGATIVE 09/09/2019 0535   LEUKOCYTESUR NEGATIVE 09/09/2019 0535   Sepsis Labs: @LABRCNTIP (procalcitonin:4,lacticidven:4) )No results found for this or any previous visit (from the past 240 hour(s)).   Radiological Exams on Admission: CT Head Wo Contrast  Result Date: 09/09/2019 CLINICAL DATA:  Fall and leg weakness EXAM: CT HEAD WITHOUT CONTRAST TECHNIQUE: Contiguous axial images were obtained from the base of the skull through the vertex without intravenous contrast. COMPARISON:  06/21/2018 FINDINGS: Brain: No evidence of acute infarction, hemorrhage, hydrocephalus, extra-axial collection or mass lesion/mass effect. Chronic small vessel ischemic gliosis that is confluent in the hemispheric white matter. 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 Brain atrophy with ventriculomegaly. Vascular: No hyperdense vessel or unexpected calcification. Skull: Negative for fracture. Sinuses/Orbits: Sclerotic mastoids from remote inflammation. High riding jugular bulb and or diverticulum on the right. IMPRESSION: 1. No acute finding or change from 2019. 2. Atrophy and chronic small vessel ischemia. Electronically Signed   By: 2020 M.D.   On: 09/09/2019 06:37   CT Knee Right Wo Contrast  Result Date: 09/09/2019 CLINICAL DATA:  11/09/2019. Right knee pain. History of recent total knee arthroplasty. EXAM: CT OF THE right KNEE WITHOUT CONTRAST TECHNIQUE: Multidetector CT imaging of the right knee  was performed according to the standard protocol. Multiplanar CT image reconstructions were also generated. COMPARISON:  Radiographs 09/09/2019 FINDINGS: The total knee arthroplasty components are intact. No acute periprosthetic fracture is identified. Small knee joint effusion is noted. Advanced atherosclerotic calcifications involving the popliteal artery and branch vessels. The quadriceps and patellar tendons are intact. Mild fatty atrophy of the knee musculature. IMPRESSION: 1. Intact total knee arthroplasty components. No acute periprosthetic fracture. 2. Small knee joint effusion. 3. Advanced atherosclerotic calcifications involving the popliteal artery and  branch vessels. Electronically Signed   By: Rudie Meyer M.D.   On: 09/09/2019 05:08   MR BRAIN WO CONTRAST  Result Date: 09/09/2019 CLINICAL DATA:  TIA with right leg weakness EXAM: MRI HEAD WITHOUT CONTRAST TECHNIQUE: Multiplanar, multiecho pulse sequences of the brain and surrounding structures were obtained without intravenous contrast. COMPARISON:  CT head 09/09/2019 FINDINGS: Brain: Negative for acute infarct. Moderate atrophy and moderate chronic microvascular ischemic change. Chronic ischemic changes are present throughout the cerebral white matter. Chronic ischemia in the basal ganglia bilaterally and right cerebellum. Chronic microhemorrhage right thalamus and central pons. No mass or hydrocephalus identified. Vascular: Normal arterial flow voids Skull and upper cervical spine: No focal skeletal lesion Sinuses/Orbits: Mild mucosal edema paranasal sinuses. Bilateral cataract extraction Other: None IMPRESSION: Negative for acute infarct Moderate atrophy and moderate chronic  ischemia. Electronically Signed   By: Marlan Palau M.D.   On: 09/09/2019 08:27   DG Knee Complete 4 Views Right  Result Date: 09/09/2019 CLINICAL DATA:  Pain EXAM: RIGHT KNEE - COMPLETE 4+ VIEW COMPARISON:  02/27/2011 FINDINGS: The patient is status post prior total  knee. There is no acute displaced fracture. No dislocation. There is osteopenia. Vascular calcifications are noted. Nonspecific edema is noted. IMPRESSION: Negative. Electronically Signed   By: Katherine Mantle M.D.   On: 09/09/2019 01:19   DG Femur Portable Min 2 Views Right  Result Date: 09/09/2019 CLINICAL DATA:  Pain status post fall EXAM: RIGHT FEMUR PORTABLE 2 VIEW COMPARISON:  None. FINDINGS: Patient is status post prior total knee arthroplasty. There is nonspecific lower extremity edema. Vascular calcifications are noted. There is no acute displaced fracture or dislocation involving the right femur. IMPRESSION: 1. No acute displaced fracture or dislocation. 2. Status post total knee arthroplasty. 3. There is nonspecific lower extremity edema. Electronically Signed   By: Katherine Mantle M.D.   On: 09/09/2019 03:53     EKG: Independently reviewed.  Not done in ED, will get one.   Assessment/Plan Principal Problem:   Fall Active Problems:   Acute renal failure superimposed on stage 3a chronic kidney disease (HCC)   Chronic systolic CHF (congestive heart failure) (HCC)   Gout   TIA (transient ischemic attack)   CAD (coronary artery disease)   HTN (hypertension)   HLD (hyperlipidemia)   V tach (HCC)   Right knee pain   Atrial fibrillation, new onset (HCC)   Generalized weakness   Leukocytosis   Fall and generalized weakness: CT of head and MRI for brain are negative. Likely multifactorial etiology, including worsening renal function and new onset A. fib. -will admit to tele bed as inpt -pt/OT -pain control  Acute renal failure superimposed on stage 3a chronic kidney disease (HCC): Baseline Cre is 1.5-2.0, pt's Cre is 2.6 and BUN 52 on admission. Likely due to dehydration and continuation of diuretics - IVF: pt was started on 100 cc/h of NS -->will change to 50 cc/h due to EF of 25-30%. - Follow up renal function by BMP - Avoid using renal toxic medications, hypotension and  contrast dye (or carefully use) - Hold dirurectics  Chronic systolic CHF (congestive heart failure) (HCC): 2D echo on 07/20/2018 showed EF 25-30%.  Patient has 1+ bilateral lower leg edema, but no worsening shortness of breath.  CHF seems to be compensated. -Hold home diuretics due to worsening renal function -Check BNP  Gout: -continue home allopurinol  Hx of TIA (transient ischemic attack): -on lipitor  CAD (coronary artery disease): s/p of stent. No CP -  on lipitor, imdur and coreg  HTN (hypertension): -continue coreg and hydralazine -prn hydralazine  HLD (hyperlipidemia): -lipitor  Hx of V tach (HCC): -on coreg and amiodarone  Right knee pain: -prn oxycodone and Tylenol  Atrial fibrillation, new onset (HCC): CHA2DS2-VASc Score is 7, needs oral anticoagulation, but pt has hx of GIB. Will not start AC now. Will consult cardiology. Heart rate is 111 now. -continue coreg, amiodarone -check TSH -2d echo -Cardiology, Dr. Gwen PoundsKowalski is consulted  Leukocytosis: mild, WBC 11.7.  No fever.  No source of infection identified, likely reactive -f/u by CBC    Inpatient status:  # Patient requires inpatient status due to high intensity of service, high risk for further deterioration and high frequency of surveillance required.  I certify that at the point of admission it is my clinical judgment that the patient will require inpatient hospital care spanning beyond 2 midnights from the point of admission.  . This patient has multiple chronic comorbidities including hypertension, hyperlipidemia, CAD, TIA, gout, sCHF with EF 25-30%, CKD-3a, CAD, stent placement, V. tach, GI bleeding, who presents with generalized weakness and the right knee pain. . Now patient has presenting with generalized weakness, fall, worsening renal function, new onset atrial fibrillation . The worrisome physical exam findings include 1+ bilateral lower leg edema, right knee tenderness . The initial radiographic  and laboratory data are worrisome because of worsening renal function, leukocytosis, EKG showed new onset atrial fibrillation . Current medical needs: please see my assessment and plan . Predictability of an adverse outcome (risk): Patient has multiple comorbidities as listed above. Now presents with generalized weakness, fall, worsening renal function, new onset atrial fibrillation. Patient's presentation is highly complicated.  Patient is at high risk of deteriorating.  Will need to be treated in hospital for at least 2 days.          DVT ppx: SQ Lovenox Code Status: DNR per his daughter Family Communication:  Yes, patient's daughter by phone Disposition Plan:  Anticipate discharge back to previous home environment Consults called:  Card, Dr. Gwen PoundsKowalski Admission status:  Tele bed as inpt        Date of Service 09/09/2019    Lorretta HarpXilin Zhaire Locker [facility]   If 7PM-7AM, please contact night-coverage [email or web address] 09/09/2019, 9:18 AM

## 2019-09-09 NOTE — TOC Initial Note (Signed)
Transition of Care [facility]) - Initial/Assessment Note    Patient Details  Name: Larry Lawrence MRN: [id number] Date of Birth: 1941/04/16  [facility] CM/SW Contact:    Allayne Butcher, RN Phone Number: 09/09/2019, 3:24 PM  Clinical Narrative:                 Patient admitted after falling at home, he reports his knees just gave out.  Patient now has a painful right knee that he is having difficulty walking on.  Patient lives at home with his grandson Sharia Reeve, and Josh's wife.  Patient reports that his grandson helps him out quite a bit at home.  Patient has a rollator that he uses to walk and he also has a wheelchair but he hasn't needed to use it.  PT has evaluated the patient and recommends SNF, patient agrees to this if he needs to but he would prefer to go home.   This RNCM will begin bed search, patient has been to [facility] in the past and does not want to go back there.  RNCM will cont to follow and assess patient progress, if patient progresses well enough to go home RNCM will arranged for home health services.    Expected Discharge Plan: Skilled Nursing Facility Barriers to Discharge: Continued Medical Work up   Patient Goals and CMS Choice Patient states their goals for this hospitalization and ongoing recovery are:: For my knee to feel better CMS Medicare.gov Compare Post Acute Care list provided to:: Patient Choice offered to / list presented to : Patient  Expected Discharge Plan and Services Expected Discharge Plan: Skilled Nursing Facility   Discharge Planning Services: CM Consult Post Acute Care Choice: Skilled Nursing Facility Living arrangements for the past 2 months: Single Family Home                                      Prior Living Arrangements/Services Living arrangements for the past 2 months: Single Family Home Lives with:: Relatives(Grandson and his wife) Patient language and need for interpreter reviewed:: Yes Do you feel safe going back  to the place where you live?: Yes      Need for Family Participation in Patient Care: Yes (Comment) Care giver support system in place?: Yes (comment)(Grandson)   Criminal Activity/Legal Involvement Pertinent to Current Situation/Hospitalization: No - Comment as needed  Activities of Daily Living Home Assistive Devices/Equipment: Environmental consultant (specify type), Eyeglasses ADL Screening (condition at time of admission) Patient's cognitive ability adequate to safely complete daily activities?: Yes Is the patient deaf or have difficulty hearing?: No Does the patient have difficulty seeing, even when wearing glasses/contacts?: No Does the patient have difficulty concentrating, remembering, or making decisions?: No Patient able to express need for assistance with ADLs?: Yes Does the patient have difficulty dressing or bathing?: No Independently performs ADLs?: Yes (appropriate for developmental age) Does the patient have difficulty walking or climbing stairs?: No Weakness of Legs: Right Weakness of Arms/Hands: None  Permission Sought/Granted Permission sought to share information with : Case Manager, Family Supports, Oceanographer granted to share information with : Yes, Verbal Permission Granted     Permission granted to share info w AGENCY: SNF's in [facility]  Permission granted to share info w Relationship: Manufacturing systems engineer     Emotional Assessment Appearance:: Appears stated age Attitude/Demeanor/Rapport: Engaged Affect (typically observed): Accepting Orientation: : Oriented to Self, Oriented  to Place, Oriented to  Time, Oriented to Situation Alcohol / Substance Use: Not Applicable Psych Involvement: No (comment)  Admission diagnosis:  Near syncope [R55] Unable to ambulate [R26.2] Ambulatory dysfunction [R26.2] Atrial fibrillation, unspecified type [facility]) [I48.91] Patient Active Problem List   Diagnosis Date Noted  . Fall 09/09/2019  . Leukocytosis  09/09/2019  . TIA (transient ischemic attack)   . CAD (coronary artery disease)   . HTN (hypertension)   . HLD (hyperlipidemia)   . V tach ([place])   . Right knee pain   . Atrial fibrillation, new onset ([place])   . Generalized weakness   . Lymphedema 08/24/2018  . Acute CHF (congestive heart failure) ([place]) 06/30/2018  . Near syncope 04/24/2017  . Bradycardia 04/24/2017  . Gout 02/01/2016  . Syncope 08/09/2015  . Hypokalemia 08/09/2015  . Acute renal failure superimposed on stage 3a chronic kidney disease ([place]) 02/20/2015  . Chronic systolic CHF (congestive heart failure) ([place]) 02/20/2015  . CKD (chronic kidney disease) 02/20/2015   PCP:  Juline Patch, MD Pharmacy:   [facility], [place] [facility] [address] [place] [address] Phone: [phone] Fax: [phone]     Social Determinants of Health (SDOH) Interventions    Readmission Risk Interventions No flowsheet data found.

## 2019-09-09 NOTE — Evaluation (Signed)
Occupational Therapy Evaluation Patient Details Name: Larry Lawrence MRN: [id number] DOB: May 23, 1941 Today's Date: 09/09/2019    History of Present Illness pt is a 79 yo presenting to ED after falling at home and now reporting R knee pain, imaging negative for stroke, acute intracranial abnormalities and fx  PMH includes HTN, HLD, CAD, TIA, CHF, right TKA   Clinical Impression   Patient seen this date for OT evaluation.  Prior to fall at home, patient was I with BADLs but required assistance for community tasks.  Patient reports he utilizes rollator for functional mobility.  Upon entry, patient supine in bed and agreeable to therapy.  Reporting 8/10 pain in R knee; requested pain medication from nurse, if able/appropriate.  Attempted to move to EOB, but patient very fearful stating he was in too much pain.  TOTAL A to reposition in bed.  Provided education on goals and role of OT in acute care setting.  Patient verbalized understanding.  Patient participated in general grooming tasks at bed level with set up.  Participated in B UE ther-ex using red theraband, 2 sets of 10, to address UB strengthening to improve safety during functional transfers.  Patient would benefit from continuing skilled occupational therapy to address activity tolerance, strength, pain reduction, ADL retraining, EC techniques, compensatory techniques, functional transfers, safety awareness and standing tolerance.  Based on today's performance, recommending SNF.  However, depending on progress with therapy, potentially d/c to [facility] with supervision.    Follow Up Recommendations  SNF(SNF vs HH with 24/7 assistance depending on progression)    Equipment Recommendations  None recommended by OT    Recommendations for Other Services       Precautions / Restrictions Precautions Precautions: Fall Restrictions Weight Bearing Restrictions: No      Mobility Bed Mobility Overal bed mobility: Needs Assistance Bed Mobility:  Supine to Sit     Supine to sit: Max assist Sit to supine: Mod assist;HOB elevated   General bed mobility comments: Attempted to move to EOB.  Patient with high anxiety and pain, stating he "can't do it".  Repositioned for comfort with TOTAL A.  Transfers Overall transfer level: Needs assistance Equipment used: Rolling walker (2 wheeled) Transfers: Sit to/from Stand Sit to Stand: Max assist;From elevated surface         General transfer comment: Unable to assess functional transfers secondary to pain    Balance Overall balance assessment: Needs assistance;History of Falls Sitting-balance support: Feet supported;Bilateral upper extremity supported Sitting balance-Leahy Scale: Fair Sitting balance - Comments: steady EOB                                   ADL either performed or assessed with clinical judgement   ADL Overall ADL's : Needs assistance/impaired Eating/Feeding: Set up;Sitting;Bed level   Grooming: Wash/dry hands;Wash/dry face;Oral care;Set up;Sitting;Bed level               Lower Body Dressing: Maximal assistance;Bed level     Toilet Transfer Details (indicate cue type and reason): Unable secondary to pain         Functional mobility during ADLs: (Unable secondary to pain) General ADL Comments: Patient with difficulty participating in ADLs secondary to pain and swelling in R knee/LE     Vision Patient Visual Report: No change from baseline Additional Comments: Baseline deficits in R eye     Perception     Praxis  Pertinent Vitals/Pain Pain Assessment: 0-10 Pain Score: 8  Pain Location: R knee Pain Descriptors / Indicators: Sore;Discomfort;Grimacing;Aching Pain Intervention(s): Limited activity within patient's tolerance;Monitored during session;Patient requesting pain meds-RN notified;Repositioned     Hand Dominance Right   Extremity/Trunk Assessment Upper Extremity Assessment Upper Extremity Assessment: Overall WFL  for tasks assessed(some discrepancy in hand grip strength, R being weaker)   Lower Extremity Assessment Lower Extremity Assessment: Defer to PT evaluation RLE: Unable to fully assess due to pain       Communication Communication Communication: No difficulties   Cognition Arousal/Alertness: Awake/alert Behavior During Therapy: WFL for tasks assessed/performed Overall Cognitive Status: Within Functional Limits for tasks assessed                                 General Comments: A&Ox4, pleasant and cooperative   General Comments  Noted swelling in R knee and dorsal aspect of R foot/ankle    Exercises Total Joint Exercises Ankle Circles/Pumps: AROM;Both;10 reps Quad Sets: AROM;Both;10 reps Other Exercises Other Exercises: Provided education on goals and role of OT in acute care setting Other Exercises: Provided general safety education on use of call light, bed controls, etc Other Exercises: Attempted to move to EOB with MAX A.  Unable secondary to pain. Other Exercises: Completed B UE ther-ex using red theraband.  Education on ankle pumps while supine in bed.   Shoulder Instructions      Home Living Family/patient expects to be discharged to:: Private residence Living Arrangements: Other relatives(Grandson and grandson's wife) Available Help at Discharge: Family;Available 24 hours/day Type of Home: House Home Access: Stairs to enter [employer] of Steps: 5 Entrance Stairs-Rails: Right;Left Home Layout: One level     Bathroom Shower/Tub: Teacher, early years/pre: Standard     Home Equipment: Environmental consultant - 4 wheels          Prior Functioning/Environment Level of Independence: Independent with assistive device(s)        Comments: Patient states he relies on family for community tasks and driving.  Utilizes rollator for functional mobility and takes baths at sink.        OT Problem List: Decreased strength;Decreased activity  tolerance;Impaired balance (sitting and/or standing);Pain      OT Treatment/Interventions: Self-care/ADL training;Therapeutic exercise;Energy conservation;Therapeutic activities;Patient/family education    OT Goals(Current goals can be found in the care plan section) Acute Rehab OT Goals Patient Stated Goal: "I want to take care of myself" OT Goal Formulation: With patient Time For Goal Achievement: 09/23/19 Potential to Achieve Goals: Good  OT Frequency: Min 1X/week   Barriers to D/C:            Co-evaluation              AM-PAC OT "6 Clicks" Daily Activity     Outcome Measure Help from another person eating meals?: None Help from another person taking care of personal grooming?: None Help from another person toileting, which includes using toliet, bedpan, or urinal?: A Lot Help from another person bathing (including washing, rinsing, drying)?: A Lot Help from another person to put on and taking off regular upper body clothing?: A Little Help from another person to put on and taking off regular lower body clothing?: A Lot 6 Click Score: 17   End of Session Nurse Communication: Patient requests pain meds  Activity Tolerance: Patient limited by pain Patient left: in bed;with call bell/phone within reach;with bed alarm set  OT Visit Diagnosis: History of falling (Z91.81)                Time: 8938-1017 OT Time Calculation (min): 22 min Charges:  OT General Charges $OT Visit: 1 Visit OT Evaluation $OT Eval Low Complexity: 1 Low OT Treatments $Therapeutic Activity: 8-22 mins $Therapeutic Exercise: 8-22 mins  Louanne Belton, MS, OTR/L 09/09/19, 3:19 PM

## 2019-09-09 NOTE — ED Triage Notes (Signed)
Pt brought in by [facility] from home states went to get water and knees became weak and slid onto the floor. States did not fall but slid onto floor. Co right knee pain, states has had a replacement to the same. Denies any dizziness, no head or neck pain.

## 2019-09-09 NOTE — ED Notes (Signed)
Pt noted to intermittently desat to 92% while resting in bed, placed on 2L via [place] by this RN to maintain O2 sats > 94%

## 2019-09-09 NOTE — ED Notes (Signed)
Pt returned from MRI at this time

## 2019-09-09 NOTE — NC FL2 (Signed)
[place] LEVEL OF CARE SCREENING TOOL     IDENTIFICATION  Patient Name: Larry Lawrence Birthdate: 1941-03-09 Sex: male Admission Date (Current Location): 09/09/2019  [place] and [place] Number:  Engineering geologist and Address:  [facility], [address], [place], [place] [address]      Provider Number: [id number]  Attending Physician Name and Address:  Ivor Costa, MD  Relative Name and Phone Number:  Merrily Pew [phone]    Current Level of Care: Hospital Recommended Level of Care: [place] Prior Approval Number:    Date Approved/Denied:   PASRR Number: [id number]  Discharge Plan: SNF    Current Diagnoses: Patient Active Problem List   Diagnosis Date Noted  . Fall 09/09/2019  . Leukocytosis 09/09/2019  . TIA (transient ischemic attack)   . CAD (coronary artery disease)   . HTN (hypertension)   . HLD (hyperlipidemia)   . V tach ([place])   . Right knee pain   . Atrial fibrillation, new onset ([place])   . Generalized weakness   . Lymphedema 08/24/2018  . Acute CHF (congestive heart failure) ([place]) 06/30/2018  . Near syncope 04/24/2017  . Bradycardia 04/24/2017  . Gout 02/01/2016  . Syncope 08/09/2015  . Hypokalemia 08/09/2015  . Acute renal failure superimposed on stage 3a chronic kidney disease ([place]) 02/20/2015  . Chronic systolic CHF (congestive heart failure) ([place]) 02/20/2015  . CKD (chronic kidney disease) 02/20/2015    Orientation RESPIRATION BLADDER Height & Weight     Self, Time, Situation, Place  O2([place] 2L) Continent Weight: 93.4 kg Height:  5\' 11"  (180.3 cm)  BEHAVIORAL SYMPTOMS/MOOD NEUROLOGICAL BOWEL NUTRITION STATUS      Continent Diet(heart healthy)  AMBULATORY STATUS COMMUNICATION OF NEEDS Skin   Limited Assist Verbally Skin abrasions, Bruising                       Personal Care Assistance Level of Assistance  Bathing, Feeding, Dressing Bathing Assistance: Limited  assistance Feeding assistance: Independent Dressing Assistance: Limited assistance     Functional Limitations Info             SPECIAL CARE FACTORS FREQUENCY  PT (By licensed PT), OT (By licensed OT)     PT Frequency: 5 times per week OT Frequency: 3 times per week            Contractures Contractures Info: Not present    Additional Factors Info  Code Status, Allergies Code Status Info: DNR Allergies Info: Niaspan           Current Medications (09/09/2019):  This is the current hospital active medication list Current Facility-Administered Medications  Medication Dose Route Frequency Provider Last Rate Last Admin  . 0.9 %  sodium chloride infusion   Intravenous Continuous Ivor Costa, MD 50 mL/hr at 09/09/19 1145 New Bag at 09/09/19 1145  . acetaminophen (TYLENOL) tablet 650 mg  650 mg Oral Q6H PRN Mansy, Jan A, MD       Or  . acetaminophen (TYLENOL) suppository 650 mg  650 mg Rectal Q6H PRN Mansy, Jan A, MD      . allopurinol (ZYLOPRIM) tablet 300 mg  300 mg Oral Daily Mansy, Jan A, MD   300 mg at 09/09/19 1240  . atorvastatin (LIPITOR) tablet 20 mg  20 mg Oral Daily Mansy, Jan A, MD      . carvedilol (COREG) tablet 25 mg  25 mg Oral BID Mansy, Arvella Merles, MD   25  mg at 09/09/19 1240  . enoxaparin (LOVENOX) injection 30 mg  30 mg Subcutaneous Q24H Mansy, Jan A, MD   30 mg at 09/09/19 0824  . hydrALAZINE (APRESOLINE) tablet 25 mg  25 mg Oral Q8H Mansy, Jan A, MD   25 mg at 09/09/19 1240  . hydrALAZINE (APRESOLINE) tablet 25 mg  25 mg Oral TID PRN Lorretta Harp, MD      . isosorbide mononitrate (IMDUR) 24 hr tablet 30 mg  30 mg Oral Daily Mansy, Jan A, MD   30 mg at 09/09/19 1240  . magnesium hydroxide (MILK OF MAGNESIA) suspension 30 mL  30 mL Oral Daily PRN Mansy, Jan A, MD      . montelukast (SINGULAIR) tablet 10 mg  10 mg Oral QHS Mansy, Jan A, MD      . ondansetron [facility]) tablet 4 mg  4 mg Oral Q6H PRN Mansy, Jan A, MD       Or  . ondansetron [facility]) injection 4 mg  4  mg Intravenous Q6H PRN Mansy, Jan A, MD      . oxyCODONE (Oxy IR/ROXICODONE) immediate release tablet 5 mg  5 mg Oral Q4H PRN Mansy, Jan A, MD   5 mg at 09/09/19 0901  . timolol (TIMOPTIC) 0.5 % ophthalmic solution 1 drop  1 drop Both Eyes BID Mansy, Jan A, MD      . traZODone (DESYREL) tablet 25 mg  25 mg Oral QHS PRN Mansy, Vernetta Honey, MD         Discharge Medications: Please see discharge summary for a list of discharge medications.  Relevant Imaging Results:  Relevant Lab Results:   Additional Information SS# [id number]  Allayne Butcher, RN

## 2019-09-09 NOTE — ED Provider Notes (Addendum)
[facility] Emergency Department Provider Note  ____________________________________________   First MD Initiated Contact with Patient 09/09/19 0257     (approximate)  I have reviewed the triage vital signs and the nursing notes.   HISTORY  Chief Complaint Fall    HPI Larry Lawrence is a 79 y.o. male with below list of previous medical conditions including CAD renal disorder congestive heart failure presents to the emergency department secondary to history of "my leg giving out".  Patient states that he went to get a glass of water tonight and while at the sink had considerable right leg weakness resulting in him sliding down to the floor.  Patient denies any head injury no loss of consciousness no nausea or vomiting.  Patient denies any preceding symptoms.  Patient currently admits to 10 out of 10 right knee pain.  Patient states that he is unable to bear weight on his right knee secondary to pain not weakness.      Past Medical History:  Diagnosis Date  . Allergy   . CHF (congestive heart failure) (HCC)   . Coronary artery disease   . Gout   . Insomnia   . Renal disorder   . Sleeps in sitting position due to orthopnea   . TIA (transient ischemic attack)     Patient Active Problem List   Diagnosis Date Noted  . Lymphedema 08/24/2018  . Acute CHF (congestive heart failure) (HCC) 06/30/2018  . Near syncope 04/24/2017  . Bradycardia 04/24/2017  . Gout 02/01/2016  . Syncope 08/09/2015  . Hypokalemia 08/09/2015  . CKD (chronic kidney disease), stage III 02/20/2015  . Chronic systolic CHF (congestive heart failure) (HCC) 02/20/2015  . CKD (chronic kidney disease) 02/20/2015    Past Surgical History:  Procedure Laterality Date  . CARDIAC CATHETERIZATION N/A 02/02/2016   Procedure: Left Heart Cath and Coronary Angiography;  Surgeon: Lamar Blinks, MD;  Location: [facility] INVASIVE CV LAB;  Service: Cardiovascular;  Laterality: N/A;  . CARDIAC SURGERY      3 stents in "main artery"  . JOINT REPLACEMENT      Prior to Admission medications   Medication Sig Start Date End Date Taking? Authorizing Provider  allopurinol (ZYLOPRIM) 300 MG tablet TAKE (1) TABLET BY MOUTH a day 06/09/19   Duanne Limerick, MD  amiodarone (PACERONE) 200 MG tablet Take 200 mg by mouth daily. Dr Andres Ege, Quentin Cornwall, MD  atorvastatin (LIPITOR) 20 MG tablet Take 20 mg by mouth daily.  01/05/18 06/09/19  [provider]  carvedilol (COREG) 12.5 MG tablet Take 1 tablet (12.5 mg total) by mouth 2 (two) times daily for 30 days. Patient taking differently: Take 25 mg by mouth 2 (two) times daily. Dr Gwen Pounds 07/22/18 06/09/19  Jama Flavors, MD  furosemide (LASIX) 40 MG tablet Take 1 tablet (40 mg total) by mouth 2 (two) times daily for 30 days. Patient taking differently: Take 40 mg by mouth 2 (two) times daily. Dr Gwen Pounds 07/22/18 09/07/18  Jama Flavors, MD  hydrALAZINE (APRESOLINE) 25 MG tablet Take 2 tablets (50 mg total) by mouth every 8 (eight) hours. Patient taking differently: Take 25 mg by mouth every 8 (eight) hours. Dr Gwen Pounds 07/22/18   Enid Baas Jude, MD  isosorbide mononitrate (IMDUR) 30 MG 24 hr tablet Take 30 mg by mouth daily. kowalski    [provider]  montelukast (SINGULAIR) 10 MG tablet TAKE (1) TABLET BY MOUTH DAILY AT BEDTIME 06/09/19   Duanne Limerick,  MD  mupirocin ointment (BACTROBAN) 2 % Apply 1 application topically 2 (two) times daily. 06/09/19   Duanne Limerick, MD  potassium chloride SA (K-DUR,KLOR-CON) 20 MEQ tablet Take 1 tablet (20 mEq total) by mouth 2 (two) times daily. Patient taking differently: Take 20 mEq by mouth 3 (three) times daily. Dr Gwen Pounds 07/22/18   Enid Baas, Jude, MD  timolol (TIMOPTIC) 0.5 % ophthalmic solution Place 1 drop into both eyes 2 (two) times daily. Dr Inez Pilgrim    [provider]  torsemide (DEMADEX) 20 MG tablet Take 2 tablets by mouth daily. Dr Tamera Punt 05/24/19   [provider]     Allergies Niaspan [niacin er]  Family History  Problem Relation Age of Onset  . Hypertension Mother   . Pancreatic cancer Mother   . Hypertension Father   . Prostate cancer Father     Social History Social History   Tobacco Use  . Smoking status: Former Smoker    Packs/day: 2.00    Years: 3.00    Pack years: 6.00    Types: Cigarettes    Quit date: 1960    Years since quitting: 61.2  . Smokeless tobacco: Never Used  . Tobacco comment: Smoking cessation materials not required  Substance Use Topics  . Alcohol use: No  . Drug use: No    Review of Systems Constitutional: No fever/chills Eyes: No visual changes. ENT: No sore throat. Cardiovascular: Denies chest pain. Respiratory: Denies shortness of breath. Gastrointestinal: No abdominal pain.  No nausea, no vomiting.  No diarrhea.  No constipation. Genitourinary: Negative for dysuria. Musculoskeletal: Negative for neck pain.  Negative for back pain.  Positive for right knee pain Integumentary: Negative for rash. Neurological: Negative for headaches, focal weakness or numbness.  ____________________________________________   PHYSICAL EXAM:  VITAL SIGNS: ED Triage Vitals [09/09/19 0031]  Enc Vitals Group     BP 111/64     Pulse Rate 95     Resp 20     Temp 97.8 F (36.6 C)     Temp Source Oral     SpO2 96 %     Weight 93.4 kg (206 lb)     Height 1.803 m (5\' 11" )     Head Circumference      Peak Flow      Pain Score 10     Pain Loc      Pain Edu?      Excl. in GC?     Constitutional: Alert and oriented.  Apparent discomfort Eyes: Conjunctivae are normal.  Head: Atraumatic. Mouth/Throat: Patient is wearing a mask. Neck: No stridor.  No meningeal signs.   Cardiovascular: Normal rate, regular rhythm. Good peripheral circulation. Grossly normal heart sounds. Respiratory: Normal respiratory effort.  No retractions. Gastrointestinal: Soft and nontender. No distention.  Musculoskeletal: Pain to  palpation of the right knee  and distal right femur Neurologic:  Normal speech and language. No gross focal neurologic deficits are appreciated.  Skin:  Skin is warm, dry and intact. Psychiatric: Mood and affect are normal. Speech and behavior are normal.  ____________________________________________   LABS (all labs ordered are listed, but only abnormal results are displayed)  Labs Reviewed  CBC - Abnormal; Notable for the following components:      Result Value   WBC 11.7 (*)    Hemoglobin 12.4 (*)    RDW 17.0 (*)    All other components within normal limits  COMPREHENSIVE METABOLIC PANEL - Abnormal; Notable for the following components:   Glucose,  Bld 156 (*)    BUN 52 (*)    Creatinine, Ser 2.60 (*)    Calcium 8.7 (*)    Alkaline Phosphatase 142 (*)    Total Bilirubin 1.6 (*)    GFR calc non Af Amer 23 (*)    GFR calc Af Amer 27 (*)    All other components within normal limits  URINALYSIS, COMPLETE (UACMP) WITH MICROSCOPIC - Abnormal; Notable for the following components:   Color, Urine YELLOW (*)    APPearance CLEAR (*)    All other components within normal limits  TROPONIN I (HIGH SENSITIVITY)  TROPONIN I (HIGH SENSITIVITY)   ____________________________________________  EKG ED ECG REPORT I, [place] Kayden Hutmacher, the attending physician, personally viewed and interpreted this ECG.   Date: 09/09/2019  EKG Time: 3:36 AM  Rate: 96  Rhythm: Atrial fibrillation  Axis: Normal  Intervals: Irregular RR interval  ST&T Change: None   ____________________________________________  RADIOLOGY I, [place] N Azoria Abbett, personally viewed and evaluated these images (plain radiographs) as part of my medical decision making, as well as reviewing the written report by the radiologist.  ED MD interpretation: Right knee x-ray revealed no fracture or dislocation.    Official radiology report(s): CT Knee Right Wo Contrast  Result Date: 09/09/2019 CLINICAL DATA:  Larey Seat. Right knee  pain. History of recent total knee arthroplasty. EXAM: CT OF THE right KNEE WITHOUT CONTRAST TECHNIQUE: Multidetector CT imaging of the right knee was performed according to the standard protocol. Multiplanar CT image reconstructions were also generated. COMPARISON:  Radiographs 09/09/2019 FINDINGS: The total knee arthroplasty components are intact. No acute periprosthetic fracture is identified. Small knee joint effusion is noted. Advanced atherosclerotic calcifications involving the popliteal artery and branch vessels. The quadriceps and patellar tendons are intact. Mild fatty atrophy of the knee musculature. IMPRESSION: 1. Intact total knee arthroplasty components. No acute periprosthetic fracture. 2. Small knee joint effusion. 3. Advanced atherosclerotic calcifications involving the popliteal artery and branch vessels. Electronically Signed   By: Rudie Meyer M.D.   On: 09/09/2019 05:08   DG Knee Complete 4 Views Right  Result Date: 09/09/2019 CLINICAL DATA:  Pain EXAM: RIGHT KNEE - COMPLETE 4+ VIEW COMPARISON:  02/27/2011 FINDINGS: The patient is status post prior total knee. There is no acute displaced fracture. No dislocation. There is osteopenia. Vascular calcifications are noted. Nonspecific edema is noted. IMPRESSION: Negative. Electronically Signed   By: Katherine Mantle M.D.   On: 09/09/2019 01:19   DG Femur Portable Min 2 Views Right  Result Date: 09/09/2019 CLINICAL DATA:  Pain status post fall EXAM: RIGHT FEMUR PORTABLE 2 VIEW COMPARISON:  None. FINDINGS: Patient is status post prior total knee arthroplasty. There is nonspecific lower extremity edema. Vascular calcifications are noted. There is no acute displaced fracture or dislocation involving the right femur. IMPRESSION: 1. No acute displaced fracture or dislocation. 2. Status post total knee arthroplasty. 3. There is nonspecific lower extremity edema. Electronically Signed   By: Katherine Mantle M.D.   On: 09/09/2019 03:53  Femur  x-ray likewise revealed no fracture dislocation CT scan revealed no fracture dislocation nonspecific lower extremity edema   Procedures   ____________________________________________   INITIAL IMPRESSION / MDM / ASSESSMENT AND PLAN / ED COURSE  As part of my medical decision making, I reviewed the following data within the electronic MEDICAL RECORD NUMBER  79 year old male presented with above-stated history and physical exam consistent with possible near syncopal episode and considerable right knee pain.  Concern for possible fracture of  the right femur and a such x-ray was performed which revealed no acute findings patient's pain scale considerable and a such CT scan was performed which is consistent with x-ray findings.  Laboratory data consistent with patient's known history of chronic kidney disease no other acute findings.  EKG did reveal evidence of atrial fibrillation.  Spoke with Dr. Stefani Dama regarding hospital admission for further evaluation and management of near syncope with what appears to be new onset A. fib, as well as inability to ambulate secondary to pain ____________________________________________  FINAL CLINICAL IMPRESSION(S) / ED DIAGNOSES  Final diagnoses:  Near syncope  Ambulatory dysfunction  Atrial fibrillation, unspecified type ([place])     MEDICATIONS GIVEN DURING THIS VISIT:  Medications  morphine 2 MG/ML injection 2 mg (has no administration in time range)  morphine 2 MG/ML injection 2 mg (2 mg Intravenous Given 09/09/19 0323)     ED Discharge Orders    None      *Please note:  Larry Lawrence was evaluated in Emergency Department on 09/09/2019 for the symptoms described in the history of present illness. He was evaluated in the context of the global COVID-19 pandemic, which necessitated consideration that the patient might be at risk for infection with the SARS-CoV-2 virus that causes COVID-19. Institutional protocols and algorithms that pertain to the  evaluation of patients at risk for COVID-19 are in a state of rapid change based on information released by regulatory bodies including the CDC and federal and state organizations. These policies and algorithms were followed during the patient's care in the ED.  Some ED evaluations and interventions may be delayed as a result of limited staffing during the pandemic.*  Note:  This document was prepared using Dragon voice recognition software and may include unintentional dictation errors.   Gregor Hams, MD 09/09/19 [id number]    Gregor Hams, MD 09/09/19 [phone]

## 2019-09-09 NOTE — Evaluation (Signed)
Physical Therapy Evaluation Patient Details Name: Larry Lawrence MRN: [id number] DOB: [birth year] Today's Date: 09/09/2019   History of Present Illness  pt is a 79 yo presenting to ED after falling at home and now reporting R knee pain, imaging negative for stroke, acute intracranial abnormalities and fx  PMH includes HTN, HLD, CAD, TIA, CHF, right TKA  Clinical Impression  Pt received resting on gurney in ED. Pt agreeable to participate with PT. Pt reports living with his grandson and grandsons wife and that someone is with him just about 24/7. Pt reports ambulating with rollator and denies any recent falls outside of one that brought him to ED. Pt reports using sink for bathing. Pt reporting pain at 8/10 at rest. Pt severely limited by pain this session. Pt required mod A to get EOB with significantly increased time and effort and limited by pain. Pt attempted several times to stand from edge of gurney with RW and max assist from therapist but was unable to achieve full upright standing secondary to increased knee pain. Pt required heavy mod A to return to supine and max A x2 to scoot up in the gurney for better positioning. Pt minimally able to perform ankle pump on right and quad set. Pt presents with decreased strength, ROM, balance and activity tolerance limiting functional mobility. Pt also with increased pain. Pt would benefit from cont acute PT to continue assessing functional mobility as this session extremely limited by pain. Current recommendation based on today is STR following hospital discharge however suspect when pain more controlled pt will be able to go home with HHPT for improved functional mobility and dec fall risk.     Follow Up Recommendations SNF;Other (comment)(SNF vs HH with caregiver assistance depending on progression)    Equipment Recommendations  Rolling walker with 5" wheels    Recommendations for Other Services       Precautions / Restrictions  Precautions Precautions: Fall Restrictions Weight Bearing Restrictions: No      Mobility  Bed Mobility Overal bed mobility: Needs Assistance Bed Mobility: Supine to Sit;Sit to Supine     Supine to sit: Mod assist;HOB elevated Sit to supine: Mod assist;HOB elevated   General bed mobility comments: mod A to get EOB and return to supine and requiring total A x2 to reposition and scoot up in bed, severely limited by pain, sig increased time and effort, physical assist for LE advancement and trunk elevation  Transfers Overall transfer level: Needs assistance Equipment used: Rolling walker (2 wheeled) Transfers: Sit to/from Stand Sit to Stand: Max assist;From elevated surface         General transfer comment: pt made several attempts to stand with Rw and max A from therapist in front but unable to fully stand, minimally able to clear bottom from bed, pt severely limited by pain  Ambulation/Gait             General Gait Details: unable at this time  Stairs            Wheelchair Mobility    Modified Rankin (Stroke Patients Only)       Balance Overall balance assessment: Needs assistance;History of Falls Sitting-balance support: Feet supported;Bilateral upper extremity supported Sitting balance-Leahy Scale: Fair Sitting balance - Comments: steady EOB                                     Pertinent Vitals/Pain Pain Assessment:  0-10 Pain Score: 9  Pain Location: R knee Pain Descriptors / Indicators: Sore;Sharp;Discomfort;Grimacing;Moaning Pain Intervention(s): Limited activity within patient's tolerance;Monitored during session;Repositioned    Home Living Family/patient expects to be discharged to:: Private residence Living Arrangements: Other relatives(grandson and his wife) Available Help at Discharge: Family;Available 24 hours/day Type of Home: House Home Access: Stairs to enter Entrance Stairs-Rails: Psychiatric nurse of  Steps: 5 Home Layout: One level Home Equipment: Walker - 4 wheels      Prior Function Level of Independence: Independent with assistive device(s)         Comments: pt reports using a rollator for ambulation, mostly has been using the sink for baths, denies any recent falls,     Hand Dominance        Extremity/Trunk Assessment   Upper Extremity Assessment Upper Extremity Assessment: Generalized weakness    Lower Extremity Assessment Lower Extremity Assessment: Generalized weakness;RLE deficits/detail RLE: Unable to fully assess due to pain       Communication   Communication: No difficulties  Cognition Arousal/Alertness: Awake/alert Behavior During Therapy: WFL for tasks assessed/performed Overall Cognitive Status: Within Functional Limits for tasks assessed                                        General Comments General comments (skin integrity, edema, etc.): B LE edema    Exercises Total Joint Exercises Ankle Circles/Pumps: AROM;Both;10 reps Quad Sets: AROM;Both;10 reps   Assessment/Plan    PT Assessment Patient needs continued PT services  PT Problem List Decreased strength;Decreased mobility;Decreased range of motion;Decreased activity tolerance;Decreased balance;Decreased knowledge of use of DME;Pain       PT Treatment Interventions DME instruction;Therapeutic exercise;Gait training;Balance training;Stair training;Neuromuscular re-education;Functional mobility training;Therapeutic activities;Patient/family education    PT Goals (Current goals can be found in the Care Plan section)  Acute Rehab PT Goals Patient Stated Goal: decrease pain PT Goal Formulation: With patient Time For Goal Achievement: 09/23/19 Potential to Achieve Goals: Fair    Frequency Min 2X/week   Barriers to discharge Inaccessible home environment      Co-evaluation               AM-PAC PT "6 Clicks" Mobility  Outcome Measure Help needed turning from  your back to your side while in a flat bed without using bedrails?: A Lot Help needed moving from lying on your back to sitting on the side of a flat bed without using bedrails?: A Lot Help needed moving to and from a bed to a chair (including a wheelchair)?: A Lot Help needed standing up from a chair using your arms (e.g., wheelchair or bedside chair)?: Total Help needed to walk in hospital room?: Total Help needed climbing 3-5 steps with a railing? : Total 6 Click Score: 9    End of Session Equipment Utilized During Treatment: Gait belt Activity Tolerance: Patient limited by pain Patient left: with call bell/phone within reach;Other (comment)(in gurney in ED) Nurse Communication: Mobility status PT Visit Diagnosis: Muscle weakness (generalized) (M62.81);Difficulty in walking, not elsewhere classified (R26.2);Pain Pain - Right/Left: Right Pain - part of body: Knee    Time: 0912-1000 PT Time Calculation (min) (ACUTE ONLY): 48 min   Charges:   PT Evaluation $PT Eval Moderate Complexity: 1 Mod PT Treatments $Therapeutic Activity: 23-37 mins        Lewi Drost PT, DPT 1:18 PM,09/09/19 [phone]   Srinidhi Landers Drucilla Chalet 09/09/2019, 1:08  PM   

## 2019-09-09 NOTE — ED Notes (Signed)
Daughter [name] [phone] updated on patient's status (social work and PT consults due to his inability to bear weight); her son Legrand Como can also be contacted if needed [phone].

## 2019-09-09 NOTE — ED Notes (Signed)
This RN to bedside, meds administered per admitting order. Pt c/o pain 7/10 to knee. Admitting MD at bedside at this time. Explained to patient will return with Covid swab and pain medication. Pt states understanding at this time.

## 2019-09-10 ENCOUNTER — Inpatient Hospital Stay
Admit: 2019-09-10 | Discharge: 2019-09-10 | Disposition: A | Discharge status: Home / Self Care | Payer: Medicare Other | Attending: Family Medicine | Admitting: Family Medicine

## 2019-09-10 DIAGNOSIS — I2510 Atherosclerotic heart disease of native coronary artery without angina pectoris: Secondary | ICD-10-CM

## 2019-09-10 LAB — CBC
HCT: 36.6 % — ABNORMAL LOW (ref 39.0–52.0)
Hemoglobin: 11.4 g/dL — ABNORMAL LOW (ref 13.0–17.0)
MCH: 28.9 pg (ref 26.0–34.0)
MCHC: 31.1 g/dL (ref 30.0–36.0)
MCV: 92.9 fL (ref 80.0–100.0)
Platelets: 131 10*3/uL — ABNORMAL LOW (ref 150–400)
RBC: 3.94 MIL/uL — ABNORMAL LOW (ref 4.22–5.81)
RDW: 17.1 % — ABNORMAL HIGH (ref 11.5–15.5)
WBC: 8.2 10*3/uL (ref 4.0–10.5)
nRBC: 0 % (ref 0.0–0.2)

## 2019-09-10 LAB — BASIC METABOLIC PANEL
Anion gap: 9 (ref 5–15)
BUN: 49 mg/dL — ABNORMAL HIGH (ref 8–23)
CO2: 24 mmol/L (ref 22–32)
Calcium: 8.5 mg/dL — ABNORMAL LOW (ref 8.9–10.3)
Chloride: 106 mmol/L (ref 98–111)
Creatinine, Ser: 2.37 mg/dL — ABNORMAL HIGH (ref 0.61–1.24)
GFR calc Af Amer: 30 mL/min — ABNORMAL LOW (ref >60)
GFR calc non Af Amer: 26 mL/min — ABNORMAL LOW (ref >60)
Glucose, Bld: 131 mg/dL — ABNORMAL HIGH (ref 70–99)
Potassium: 3.2 mmol/L — ABNORMAL LOW (ref 3.5–5.1)
Sodium: 139 mmol/L (ref 135–145)

## 2019-09-10 LAB — ECHOCARDIOGRAM COMPLETE
Height: 71 in
Weight: 3296 oz

## 2019-09-10 MED ORDER — ENOXAPARIN SODIUM 40 MG/0.4ML ~~LOC~~ SOLN
40.0000 mg | SUBCUTANEOUS | Status: DC
  Administered 2019-09-10 – 2019-09-13 (×4): 40 mg via SUBCUTANEOUS
  Filled 2019-09-10 (×4): qty 0.4

## 2019-09-10 MED ORDER — LIDOCAINE 5 % EX PTCH
1.0000 | MEDICATED_PATCH | CUTANEOUS | Status: DC
  Administered 2019-09-10 – 2019-09-12 (×3): 1 via TRANSDERMAL
  Filled 2019-09-10 (×4): qty 1

## 2019-09-10 MED ORDER — POTASSIUM CHLORIDE CRYS ER 20 MEQ PO TBCR
40.0000 meq | EXTENDED_RELEASE_TABLET | Freq: Once | ORAL | Status: AC
  Administered 2019-09-10: 10:00:00 40 meq via ORAL
  Filled 2019-09-10: qty 2

## 2019-09-10 NOTE — Progress Notes (Signed)
[facility] Encounter Note  Patient: Larry Lawrence / Admit Date: 09/09/2019 / Date of Encounter: 09/10/2019, 7:40 AM   Subjective: Patient is stable at this time with no current evidence of congestive heart failure or anginal symptoms after a fall for which she appears to be recovering.  Heart rate control of atrial fibrillation reasonable at 80 to 90 bpm with carvedilol.  Patient has been on amiodarone although currently not remaining in normal sinus rhythm.  Previously patient has not been on anticoagulation due to significant fall risk and current fall of unknown etiology.  Troponin with minimal elevation consistent with demand ischemia rather than acute coronary syndrome  Review of Systems: Positive for: Shortness of breath Negative for: Vision change, hearing change, syncope, dizziness, nausea, vomiting,diarrhea, bloody stool, stomach pain, cough, congestion, diaphoresis, urinary frequency, urinary pain,skin lesions, skin rashes Others previously listed  Objective: Telemetry: Atrial fibrillation with controlled ventricular rate Physical Exam: Blood pressure (!) 139/93, pulse (!) 47, temperature 97.6 F (36.4 C), temperature source Oral, resp. rate 16, height 5\' 11"  (1.803 m), weight 93.4 kg, SpO2 97 %. Body mass index is 28.73 kg/m. General: Well developed, well nourished, in no acute distress. Head: Normocephalic, atraumatic, sclera non-icteric, no xanthomas, nares are without discharge. Neck: No apparent masses Lungs: Normal respirations with few wheezes, no rhonchi, no rales , no crackles   Heart: Regular rate and rhythm, normal S1 S2, no murmur, no rub, no gallop, PMI is normal size and placement, carotid upstroke normal without bruit, jugular venous pressure normal Abdomen: Soft, non-tender, non-distended with normoactive bowel sounds. No hepatosplenomegaly. Abdominal aorta is normal size without bruit Extremities: Trace to 1+ edema, no clubbing, no cyanosis,  no ulcers,  Peripheral: 2+ radial, 2+ femoral, 2+ dorsal pedal pulses Neuro: Alert and oriented. Moves all extremities spontaneously. Psych:  Responds to questions appropriately with a normal affect.   Intake/Output Summary (Last 24 hours) at 09/10/2019 0740 Last data filed at 09/10/2019 0458 Gross per 24 hour  Intake 774.82 ml  Output 1100 ml  Net -325.18 ml    Inpatient Medications:  . allopurinol  300 mg Oral Daily  . amiodarone  200 mg Oral Daily  . atorvastatin  20 mg Oral Daily  . carvedilol  25 mg Oral BID  . enoxaparin (LOVENOX) injection  40 mg Subcutaneous Q24H  . hydrALAZINE  25 mg Oral Q8H  . isosorbide mononitrate  30 mg Oral Daily  . montelukast  10 mg Oral QHS  . timolol  1 drop Both Eyes BID   Infusions:  . sodium chloride 50 mL/hr at 09/10/19 11/10/19    Labs: Recent Labs    09/09/19 0320 09/10/19 0440  NA 142 139  K 3.6 3.2*  CL 106 106  CO2 26 24  GLUCOSE 156* 131*  BUN 52* 49*  CREATININE 2.60* 2.37*  CALCIUM 8.7* 8.5*   Recent Labs    09/09/19 0320  AST 29  ALT 22  ALKPHOS 142*  BILITOT 1.6*  PROT 7.3  ALBUMIN 3.9   Recent Labs    09/09/19 0320 09/10/19 0440  WBC 11.7* 8.2  HGB 12.4* 11.4*  HCT 39.5 36.6*  MCV 92.9 92.9  PLT 180 131*   No results for input(s): CKTOTAL, CKMB, TROPONINI in the last 72 hours. Invalid input(s): POCBNP No results for input(s): HGBA1C in the last 72 hours.   Weights: Filed Weights   09/09/19 0031  Weight: 93.4 kg     Radiology/Studies:  CT Head Wo Contrast  Result Date: 09/09/2019 CLINICAL DATA:  Fall and leg weakness EXAM: CT HEAD WITHOUT CONTRAST TECHNIQUE: Contiguous axial images were obtained from the base of the skull through the vertex without intravenous contrast. COMPARISON:  06/21/2018 FINDINGS: Brain: No evidence of acute infarction, hemorrhage, hydrocephalus, extra-axial collection or mass lesion/mass effect. Chronic small vessel ischemic gliosis that is confluent in the hemispheric  white matter. 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 Brain atrophy with ventriculomegaly. Vascular: No hyperdense vessel or unexpected calcification. Skull: Negative for fracture. Sinuses/Orbits: Sclerotic mastoids from remote inflammation. High riding jugular bulb and or diverticulum on the right. IMPRESSION: 1. No acute finding or change from 2019. 2. Atrophy and chronic small vessel ischemia. Electronically Signed   By: Marnee Spring M.D.   On: 09/09/2019 06:37   CT Knee Right Wo Contrast  Result Date: 09/09/2019 CLINICAL DATA:  Larey Seat. Right knee pain. History of recent total knee arthroplasty. EXAM: CT OF THE right KNEE WITHOUT CONTRAST TECHNIQUE: Multidetector CT imaging of the right knee was performed according to the standard protocol. Multiplanar CT image reconstructions were also generated. COMPARISON:  Radiographs 09/09/2019 FINDINGS: The total knee arthroplasty components are intact. No acute periprosthetic fracture is identified. Small knee joint effusion is noted. Advanced atherosclerotic calcifications involving the popliteal artery and branch vessels. The quadriceps and patellar tendons are intact. Mild fatty atrophy of the knee musculature. IMPRESSION: 1. Intact total knee arthroplasty components. No acute periprosthetic fracture. 2. Small knee joint effusion. 3. Advanced atherosclerotic calcifications involving the popliteal artery and branch vessels. Electronically Signed   By: Rudie Meyer M.D.   On: 09/09/2019 05:08   MR BRAIN WO CONTRAST  Result Date: 09/09/2019 CLINICAL DATA:  TIA with right leg weakness EXAM: MRI HEAD WITHOUT CONTRAST TECHNIQUE: Multiplanar, multiecho pulse sequences of the brain and surrounding structures were obtained without intravenous contrast. COMPARISON:  CT head 09/09/2019 FINDINGS: Brain: Negative for acute infarct. Moderate atrophy and moderate chronic microvascular ischemic change. Chronic  ischemic changes are present throughout the cerebral white matter. Chronic ischemia in the basal ganglia bilaterally and right cerebellum. Chronic microhemorrhage right thalamus and central pons. No mass or hydrocephalus identified. Vascular: Normal arterial flow voids Skull and upper cervical spine: No focal skeletal lesion Sinuses/Orbits: Mild mucosal edema paranasal sinuses. Bilateral cataract extraction Other: None IMPRESSION: Negative for acute infarct Moderate atrophy and moderate chronic  ischemia. Electronically Signed   By: Marlan Palau M.D.   On: 09/09/2019 08:27   DG Knee Complete 4 Views Right  Result Date: 09/09/2019 CLINICAL DATA:  Pain EXAM: RIGHT KNEE - COMPLETE 4+ VIEW COMPARISON:  02/27/2011 FINDINGS: The patient is status post prior total knee. There is no acute displaced fracture. No dislocation. There is osteopenia. Vascular calcifications are noted. Nonspecific edema is noted. IMPRESSION: Negative. Electronically Signed   By: Katherine Mantle M.D.   On: 09/09/2019 01:19   DG Femur Portable Min 2 Views Right  Result Date: 09/09/2019 CLINICAL DATA:  Pain status post fall EXAM: RIGHT FEMUR PORTABLE 2 VIEW COMPARISON:  None. FINDINGS: Patient is status post prior total knee arthroplasty. There is nonspecific lower extremity edema. Vascular calcifications are noted. There is no acute displaced fracture or dislocation involving the right femur. IMPRESSION: 1. No acute displaced fracture or dislocation. 2. Status post total knee arthroplasty. 3. There is nonspecific lower extremity edema. Electronically Signed   By: Katherine Mantle M.D.   On: 09/09/2019 03:53     Assessment and Recommendation  79 y.o. male  with known dilated cardiomyopathy and chronic congestive heart failure chronic kidney disease stage III coronary atherosclerosis and paroxysmal nonvalvular atrial fibrillation with an episode of a fall of unknown etiology and no current evidence of severe injury requiring further  intervention and now without congestive heart failure exacerbation or myocardial infarction 1.  Continue supportive care of fall and begin rehabilitation 2.  Continue current medical regimen including carvedilol and amiodarone for heart rate control of atrial fibrillation and will further assess need for adjustments of medication management and/or maintenance of normal sinus rhythm as outpatient 3.  No anticoagulation at this time due to severe fall risk and concerns of bleeding risk 4.  No further cardiac diagnostics necessary at this time due to no evidence of angina myocardial infarction and/or congestive heart failure exacerbation 5.  Continue telemetry while patient is ambulating following for any rhythm disturbances that may contribute to fall  Signed, Serafina Royals M.D. FACC

## 2019-09-10 NOTE — Consult Note (Signed)
ORTHOPAEDIC CONSULTATION  REQUESTING PHYSICIAN: Enedina Finner, MD  Chief Complaint:   Right knee pain and swelling.  History of Present Illness: Larry Lawrence is a 79 y.o. male with multiple medical problems including coronary artery disease, congestive heart failure, gout, chronic renal insufficiency, and status post a TIA who lives independently.  The patient also is status post a right total knee arthroplasty by Dr. Ernest Pine about 10 years ago.  The patient was in his usual state of health until 2 nights ago when he apparently fell gently to the floor while going to get some water.  He states that his knees "became weak", causing him to slide to the floor.  He presented to the emergency room early yesterday morning complaining of severe right knee pain.  X-rays in the emergency room were negative for a fracture, as well as a CT scan was also suggested no evidence for quadriceps or patella tendon injury.  The patient was unable to bear weight on his right leg and subsequently was admitted for further evaluation and treatment.  The patient denies any associated injuries.  Did not strike his head or lose consciousness.  The patient also denies any lightheadedness, dizziness, chest pain, shortness of breath, or other symptoms which may have contributed to his fall.  Past Medical History:  Diagnosis Date  . Allergy   . CHF (congestive heart failure) (HCC)   . Coronary artery disease   . Gout   . Insomnia   . Renal disorder   . Sleeps in sitting position due to orthopnea   . TIA (transient ischemic attack)    Past Surgical History:  Procedure Laterality Date  . CARDIAC CATHETERIZATION N/A 02/02/2016   Procedure: Left Heart Cath and Coronary Angiography;  Surgeon: Lamar Blinks, MD;  Location: [facility] INVASIVE CV LAB;  Service: Cardiovascular;  Laterality: N/A;  . CARDIAC SURGERY     3 stents in "main artery"  . JOINT REPLACEMENT      Social History   Socioeconomic History  . Marital status: Married    Spouse name: Not on file  . Number of children: 2  . Years of education: Not on file  . Highest education level: 12th grade  Occupational History  . Occupation: retired  Tobacco Use  . Smoking status: Former Smoker    Packs/day: 2.00    Years: 3.00    Pack years: 6.00    Types: Cigarettes    Quit date: 1960    Years since quitting: 61.2  . Smokeless tobacco: Never Used  . Tobacco comment: Smoking cessation materials not required  Substance and Sexual Activity  . Alcohol use: No  . Drug use: No  . Sexual activity: Not Currently  Other Topics Concern  . Not on file  Social History Narrative  . Not on file   Social Determinants of Health   Financial Resource Strain:   . Difficulty of Paying Living Expenses:   Food Insecurity:   . Worried About Programme researcher, broadcasting/film/video in the Last Year:   . Barista in the Last Year:   Transportation Needs:   . Freight forwarder (Medical):   Marland Kitchen Lack of Transportation (Non-Medical):   Physical Activity:   . Days of Exercise per Week:   . Minutes of Exercise per Session:   Stress:   . Feeling of Stress :   Social Connections:   . Frequency of Communication with Friends and Family:   . Frequency of Social Gatherings with  Friends and Family:   . Attends Religious Services:   . Active Member of Clubs or Organizations:   . Attends Banker Meetings:   Marland Kitchen Marital Status:    Family History  Problem Relation Age of Onset  . Hypertension Mother   . Pancreatic cancer Mother   . Hypertension Father   . Prostate cancer Father    Allergies  Allergen Reactions  . Niaspan [Niacin Er] Other (See Comments)    Reaction:  Hot flashes    Prior to Admission medications   Medication Sig Start Date End Date Taking? Authorizing Provider  allopurinol (ZYLOPRIM) 300 MG tablet TAKE (1) TABLET BY MOUTH a day 06/09/19  Yes Duanne Limerick, MD  amiodarone  (PACERONE) 200 MG tablet Take 200 mg by mouth daily. Dr Suzzanne Cloud, MD  atorvastatin (LIPITOR) 20 MG tablet Take 20 mg by mouth daily.  01/05/18 09/09/19 Yes [provider]  carvedilol (COREG) 12.5 MG tablet Take 1 tablet (12.5 mg total) by mouth 2 (two) times daily for 30 days. Patient taking differently: Take 25 mg by mouth 2 (two) times daily. Dr Gwen Pounds 07/22/18 09/09/19 Yes Enid Baas, Jude, MD  hydrALAZINE (APRESOLINE) 25 MG tablet Take 2 tablets (50 mg total) by mouth every 8 (eight) hours. Patient taking differently: Take 25 mg by mouth every 8 (eight) hours. Dr Gwen Pounds 07/22/18  Yes Enid Baas, Jude, MD  isosorbide mononitrate (IMDUR) 30 MG 24 hr tablet Take 30 mg by mouth daily. kowalski   Yes [provider]  montelukast (SINGULAIR) 10 MG tablet TAKE (1) TABLET BY MOUTH DAILY AT BEDTIME 06/09/19  Yes Duanne Limerick, MD  potassium chloride SA (K-DUR,KLOR-CON) 20 MEQ tablet Take 1 tablet (20 mEq total) by mouth 2 (two) times daily. Patient taking differently: Take 20 mEq by mouth 2 (two) times daily. Dr Gwen Pounds 07/22/18  Yes Enid Baas, Jude, MD  timolol (TIMOPTIC) 0.5 % ophthalmic solution Place 1 drop into both eyes 2 (two) times daily. Dr Inez Pilgrim   Yes [provider]  torsemide (DEMADEX) 20 MG tablet Take 40 mg by mouth daily. Dr Tamera Punt 05/24/19  Yes [provider]  furosemide (LASIX) 40 MG tablet Take 1 tablet (40 mg total) by mouth 2 (two) times daily for 30 days. Patient not taking: Reported on 09/09/2019 07/22/18 09/07/18  Jama Flavors, MD  mupirocin ointment (BACTROBAN) 2 % Apply 1 application topically 2 (two) times daily. Patient not taking: Reported on 09/09/2019 06/09/19   Duanne Limerick, MD   CT Head Wo Contrast  Result Date: 09/09/2019 CLINICAL DATA:  Fall and leg weakness EXAM: CT HEAD WITHOUT CONTRAST TECHNIQUE: Contiguous axial images were obtained from the base of the skull through the vertex without intravenous contrast. COMPARISON:   06/21/2018 FINDINGS: Brain: No evidence of acute infarction, hemorrhage, hydrocephalus, extra-axial collection or mass lesion/mass effect. Chronic small vessel ischemic gliosis that is confluent in the hemispheric white matter. 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 Brain atrophy with ventriculomegaly. Vascular: No hyperdense vessel or unexpected calcification. Skull: Negative for fracture. Sinuses/Orbits: Sclerotic mastoids from remote inflammation. High riding jugular bulb and or diverticulum on the right. IMPRESSION: 1. No acute finding or change from 2019. 2. Atrophy and chronic small vessel ischemia. Electronically Signed   By: Marnee Spring M.D.   On: 09/09/2019 06:37   CT Knee Right Wo Contrast  Result Date: 09/09/2019 CLINICAL DATA:  Larey Seat. Right knee pain. History of recent total knee  arthroplasty. EXAM: CT OF THE right KNEE WITHOUT CONTRAST TECHNIQUE: Multidetector CT imaging of the right knee was performed according to the standard protocol. Multiplanar CT image reconstructions were also generated. COMPARISON:  Radiographs 09/09/2019 FINDINGS: The total knee arthroplasty components are intact. No acute periprosthetic fracture is identified. Small knee joint effusion is noted. Advanced atherosclerotic calcifications involving the popliteal artery and branch vessels. The quadriceps and patellar tendons are intact. Mild fatty atrophy of the knee musculature. IMPRESSION: 1. Intact total knee arthroplasty components. No acute periprosthetic fracture. 2. Small knee joint effusion. 3. Advanced atherosclerotic calcifications involving the popliteal artery and branch vessels. Electronically Signed   By: Rudie Meyer M.D.   On: 09/09/2019 05:08   MR BRAIN WO CONTRAST  Result Date: 09/09/2019 CLINICAL DATA:  TIA with right leg weakness EXAM: MRI HEAD WITHOUT CONTRAST TECHNIQUE: Multiplanar, multiecho pulse sequences of the brain and surrounding  structures were obtained without intravenous contrast. COMPARISON:  CT head 09/09/2019 FINDINGS: Brain: Negative for acute infarct. Moderate atrophy and moderate chronic microvascular ischemic change. Chronic ischemic changes are present throughout the cerebral white matter. Chronic ischemia in the basal ganglia bilaterally and right cerebellum. Chronic microhemorrhage right thalamus and central pons. No mass or hydrocephalus identified. Vascular: Normal arterial flow voids Skull and upper cervical spine: No focal skeletal lesion Sinuses/Orbits: Mild mucosal edema paranasal sinuses. Bilateral cataract extraction Other: None IMPRESSION: Negative for acute infarct Moderate atrophy and moderate chronic  ischemia. Electronically Signed   By: Marlan Palau M.D.   On: 09/09/2019 08:27   DG Knee Complete 4 Views Right  Result Date: 09/09/2019 CLINICAL DATA:  Pain EXAM: RIGHT KNEE - COMPLETE 4+ VIEW COMPARISON:  02/27/2011 FINDINGS: The patient is status post prior total knee. There is no acute displaced fracture. No dislocation. There is osteopenia. Vascular calcifications are noted. Nonspecific edema is noted. IMPRESSION: Negative. Electronically Signed   By: Katherine Mantle M.D.   On: 09/09/2019 01:19   ECHOCARDIOGRAM COMPLETE  Result Date: 09/10/2019    ECHOCARDIOGRAM REPORT   Patient Name:   Larry Lawrence Date of Exam: 09/10/2019 Medical Rec #:  [id number]      Height:       71.0 in Accession #:    [id number]     Weight:       206.0 lb Date of Birth:  04/08/1941      BSA:          2.135 m Patient Age:    78 years       BP:           139/93 mmHg Patient Gender: M              HR:           86 bpm. Exam Location:  [facility] Procedure: 2D Echo, Color Doppler and Cardiac Doppler Indications:     I48.91 Atrial Fibrillation  History:         Patient has prior history of Echocardiogram examinations. CHF,                  CAD, TIA; Arrythmias:Atrial Fibrillation.  Sonographer:     Humphrey Rolls RDCS (AE) Referring Phys:   [id number] Vernetta Honey MANSY Diagnosing Phys: Arnoldo Hooker MD  Sonographer Comments: Suboptimal apical window and no subcostal window. IMPRESSIONS  1. Left ventricular ejection fraction, by estimation, is 20 to 25%. The left ventricle has severely decreased function. The left ventricle demonstrates global hypokinesis. The left ventricular internal cavity size was mildly dilated.  Left ventricular diastolic parameters were normal.  2. Right ventricular systolic function is mildly reduced. The right ventricular size is moderately enlarged. There is moderately elevated pulmonary artery systolic pressure.  3. Left atrial size was moderately dilated.  4. Right atrial size was moderately dilated.  5. The mitral valve is normal in structure. Mild mitral valve regurgitation.  6. Tricuspid valve regurgitation is moderate.  7. The aortic valve is normal in structure. Aortic valve regurgitation is trivial. FINDINGS  Left Ventricle: Left ventricular ejection fraction, by estimation, is 20 to 25%. The left ventricle has severely decreased function. The left ventricle demonstrates global hypokinesis. The left ventricular internal cavity size was mildly dilated. There is no left ventricular hypertrophy. Left ventricular diastolic parameters were normal. Right Ventricle: The right ventricular size is moderately enlarged. No increase in right ventricular wall thickness. Right ventricular systolic function is mildly reduced. There is moderately elevated pulmonary artery systolic pressure. The tricuspid regurgitant velocity is 3.85 m/s, and with an assumed right atrial pressure of 10 mmHg, the estimated right ventricular systolic pressure is 08.6 mmHg. Left Atrium: Left atrial size was moderately dilated. Right Atrium: Right atrial size was moderately dilated. Pericardium: There is no evidence of pericardial effusion. Mitral Valve: The mitral valve is normal in structure. Mild mitral valve regurgitation. MV peak gradient, 5.4 mmHg. The mean  mitral valve gradient is 3.0 mmHg. Tricuspid Valve: The tricuspid valve is normal in structure. Tricuspid valve regurgitation is moderate. Aortic Valve: The aortic valve is normal in structure. Aortic valve regurgitation is trivial. Aortic valve mean gradient measures 5.0 mmHg. Aortic valve peak gradient measures 9.0 mmHg. Aortic valve area, by VTI measures 1.63 cm. Pulmonic Valve: The pulmonic valve was normal in structure. Pulmonic valve regurgitation is trivial. Aorta: The aortic root and ascending aorta are structurally normal, with no evidence of dilitation. IAS/Shunts: No atrial level shunt detected by color flow Doppler.  LEFT VENTRICLE PLAX 2D LVIDd:         5.53 cm  Diastology LVIDs:         4.76 cm  LV e' lateral:   9.25 cm/s LV PW:         0.98 cm  LV E/e' lateral: 11.7 LV IVS:        0.81 cm  LV e' medial:    4.35 cm/s LVOT diam:     2.40 cm  LV E/e' medial:  24.8 LV SV:         40 LV SV Index:   19 LVOT Area:     4.52 cm  RIGHT VENTRICLE RV Basal diam:  4.78 cm LEFT ATRIUM              Index       RIGHT ATRIUM           Index LA diam:        4.60 cm  2.15 cm/m  RA Area:     33.30 cm LA Vol (A2C):   95.6 ml  44.77 ml/m RA Volume:   131.00 ml 61.35 ml/m LA Vol (A4C):   108.0 ml 50.58 ml/m LA Biplane Vol: 104.0 ml 48.71 ml/m  AORTIC VALVE                    PULMONIC VALVE AV Area (Vmax):    1.89 cm     PV Vmax:       1.02 m/s AV Area (Vmean):   1.94 cm     PV Vmean:  70.600 cm/s AV Area (VTI):     1.63 cm     PV VTI:        0.183 m AV Vmax:           150.00 cm/s  PV Peak grad:  4.2 mmHg AV Vmean:          103.000 cm/s PV Mean grad:  2.0 mmHg AV VTI:            0.243 m AV Peak Grad:      9.0 mmHg AV Mean Grad:      5.0 mmHg LVOT Vmax:         62.80 cm/s LVOT Vmean:        44.100 cm/s LVOT VTI:          0.088 m LVOT/AV VTI ratio: 0.36  AORTA Ao Root diam: 3.30 cm MITRAL VALVE                TRICUSPID VALVE MV Area (PHT): 4.59 cm     TR Peak grad:   59.3 mmHg MV Peak grad:  5.4 mmHg     TR  Vmax:        385.00 cm/s MV Mean grad:  3.0 mmHg MV Vmax:       1.16 m/s     SHUNTS MV Vmean:      79.6 cm/s    Systemic VTI:  0.09 m MV Decel Time: 165 msec     Systemic Diam: 2.40 cm MV E velocity: 108.00 cm/s Arnoldo Hooker MD Electronically signed by Arnoldo Hooker MD Signature Date/Time: 09/10/2019/4:15:20 PM    Final    DG Femur Portable Min 2 Views Right  Result Date: 09/09/2019 CLINICAL DATA:  Pain status post fall EXAM: RIGHT FEMUR PORTABLE 2 VIEW COMPARISON:  None. FINDINGS: Patient is status post prior total knee arthroplasty. There is nonspecific lower extremity edema. Vascular calcifications are noted. There is no acute displaced fracture or dislocation involving the right femur. IMPRESSION: 1. No acute displaced fracture or dislocation. 2. Status post total knee arthroplasty. 3. There is nonspecific lower extremity edema. Electronically Signed   By: Katherine Mantle M.D.   On: 09/09/2019 03:53    Positive ROS: All other systems have been reviewed and were otherwise negative with the exception of those mentioned in the HPI and as above.  Physical Exam: General:  Alert, no acute distress Psychiatric:  Patient is competent for consent with normal mood and affect   Cardiovascular:  No pedal edema Respiratory:  No wheezing, non-labored breathing GI:  Abdomen is soft and non-tender Skin:  No lesions in the area of chief complaint Neurologic:  Sensation intact distally Lymphatic:  No axillary or cervical lymphadenopathy  Orthopedic Exam:  Orthopedic examination is limited to the patient's right knee and lower extremity.  Skin inspection of the right knee demonstrates a well-healed anterior midline surgical incision which is without evidence of infection.  There is mild generalized swelling throughout his right lower leg and foot due to chronic lower extremity edema.  This finding is relatively symmetric to his left lower extremity.  He does appear to have a small knee effusion.  He has  mild tenderness to palpation over the medial and lateral aspects of the knee.  He has more severe pain with attempted active or passive range of motion of the knee as he cannot tolerate more than 30 to 40 degrees of flexion.  He also has pain with attempting a straight leg raise, although he can  maintain his foot off the bed, demonstrating an intact extensor mechanism.  He is able to dorsiflex and plantarflex his toes and ankle.  Sensations intact light touch to all distributions.  He exhibits fair capillary refill to his right foot.  X-rays:  Recent x-rays of the right knee and right femur are available for review and has been reviewed by myself.  These films demonstrate a well-positioned right total knee arthroplasty.  The components appear to be well fixed and without evidence of loosening.  No fractures, lytic lesions, or other acute bony pathology is noted.  A CT of the right knee also is available for review and has been reviewed by myself.  The findings are as described above.  The quadriceps and patellar tendons appear to be intact on this study.  There is a small effusion.  No acute bony abnormalities are identified.  The knee components appear to be well fixed and without evidence of loosening.    Assessment: Acute right knee pain of unclear etiology status post prior right total knee arthroplasty.  Plan: Based on the patient's presentation and examination findings, I feel that most likely he has sprained his knee.  The x-rays and CT scan do not demonstrate any evidence for fracture or major tendon rupture, and he does appear to have an intact extensor mechanism on exam.  The components also appear to be well fixed and without evidence of loosening.  The possibility of an acute sepsis of the right knee must be considered as well.  However, he has been afebrile since his arrival to the hospital and his white count has actually decreased from 11.7 to 8.2 over the first 24 hours of admission,  suggesting that this is not a septic knee.  Therefore, at this point, I do not feel it is necessary or advisable to aspirate the knee and possibly risk introducing an infection into the joint.  However, I will order a repeat CBC tomorrow, as well as a sed rate and C-reactive protein.  If these are elevated, I may reconsider a knee aspiration.  Meanwhile, I would recommend treating his right knee symptomatically with ice and appropriate medications to help reduce his pain.  He may be mobilized with physical therapy, utilizing a knee immobilizer to help support his knee if needed.  Thank you for asking me to participate in the care of this most pleasant yet unfortunate man.  I will be happy to follow him with you.   Maryagnes AmosJ. Jeffrey Mala Gibbard, MD  Beeper #:  [phone]  09/10/2019 5:52 PM

## 2019-09-10 NOTE — Progress Notes (Signed)
[facility] NAME: Larry Lawrence    MR#:  [id number]  DATE OF BIRTH:  17-Nov-1940  SUBJECTIVE:   Patient came in after he had a accidental fall with right knee buckling and hitting the floor without any head injury. Patient did not pass out. Complains of right knee pain no fever, chest pain shortness of breath   REVIEW OF SYSTEMS:   Review of Systems  Constitutional: Negative for chills, fever and weight loss.  HENT: Negative for ear discharge, ear pain and nosebleeds.   Eyes: Negative for blurred vision, pain and discharge.  Respiratory: Negative for sputum production, shortness of breath, wheezing and stridor.   Cardiovascular: Negative for chest pain, palpitations, orthopnea and PND.  Gastrointestinal: Negative for abdominal pain, diarrhea, nausea and vomiting.  Genitourinary: Negative for frequency and urgency.  Musculoskeletal: Positive for joint pain. Negative for back pain.  Neurological: Negative for sensory change, speech change, focal weakness and weakness.  Psychiatric/Behavioral: Negative for depression and hallucinations. The patient is not nervous/anxious.    Tolerating Diet:yes Tolerating PT: recommends rehab however patient wishes to go home with home health  DRUG ALLERGIES:   Allergies  Allergen Reactions  . Niaspan [Niacin Er] Other (See Comments)    Reaction:  Hot flashes     VITALS:  Blood pressure 132/88, pulse (!) 101, temperature 97.9 F (36.6 C), temperature source Rectal, resp. rate (!) 22, height 5\' 11"  (1.803 m), weight 93.4 kg, SpO2 98 %.  PHYSICAL EXAMINATION:   Physical Exam  GENERAL:  79 y.o.-year-old patient lying in the bed with no acute distress.  EYES: Pupils equal, round, reactive to light and accommodation. No scleral icterus.   HEENT: Head atraumatic, normocephalic. Oropharynx and nasopharynx clear.  NECK:  Supple, no jugular venous distention. No thyroid enlargement, no tenderness.   LUNGS: Normal breath sounds bilaterally, no wheezing, rales, rhonchi. No use of accessory muscles of respiration.  CARDIOVASCULAR: S1, S2 normal. No murmurs, rubs, or gallops.  ABDOMEN: Soft, nontender, nondistended. Bowel sounds present. No organomegaly or mass.  EXTREMITIES: No cyanosis, clubbing , decreased ROM right knee. No skin abrasion or brusising ++ chronic edema b/l.    NEUROLOGIC: Cranial nerves II through XII are intact. No focal Motor or sensory deficits b/l.   PSYCHIATRIC:  patient is alert and oriented x 3.  SKIN: No obvious rash, lesion, or ulcer.   LABORATORY PANEL:  CBC Recent Labs  Lab 09/10/19 0440  WBC 8.2  HGB 11.4*  HCT 36.6*  PLT 131*    Chemistries  Recent Labs  Lab 09/09/19 0320 09/09/19 0320 09/10/19 0440  NA 142   < > 139  K 3.6   < > 3.2*  CL 106   < > 106  CO2 26   < > 24  GLUCOSE 156*   < > 131*  BUN 52*   < > 49*  CREATININE 2.60*   < > 2.37*  CALCIUM 8.7*   < > 8.5*  AST 29  --   --   ALT 22  --   --   ALKPHOS 142*  --   --   BILITOT 1.6*  --   --    < > = values in this interval not displayed.   Cardiac Enzymes No results for input(s): TROPONINI in the last 168 hours. RADIOLOGY:  CT Head Wo Contrast  Result Date: 09/09/2019 CLINICAL DATA:  Fall and leg weakness EXAM: CT HEAD WITHOUT CONTRAST TECHNIQUE: Contiguous  axial images were obtained from the base of the skull through the vertex without intravenous contrast. COMPARISON:  06/21/2018 FINDINGS: Brain: No evidence of acute infarction, hemorrhage, hydrocephalus, extra-axial collection or mass lesion/mass effect. Chronic small vessel ischemic gliosis that is confluent in the hemispheric white matter. 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 Brain atrophy with ventriculomegaly. Vascular: No hyperdense vessel or unexpected calcification. Skull: Negative for fracture. Sinuses/Orbits: Sclerotic mastoids from remote inflammation.  High riding jugular bulb and or diverticulum on the right. IMPRESSION: 1. No acute finding or change from 2019. 2. Atrophy and chronic small vessel ischemia. Electronically Signed   By: Marnee Spring M.D.   On: 09/09/2019 06:37   CT Knee Right Wo Contrast  Result Date: 09/09/2019 CLINICAL DATA:  Larey Seat. Right knee pain. History of recent total knee arthroplasty. EXAM: CT OF THE right KNEE WITHOUT CONTRAST TECHNIQUE: Multidetector CT imaging of the right knee was performed according to the standard protocol. Multiplanar CT image reconstructions were also generated. COMPARISON:  Radiographs 09/09/2019 FINDINGS: The total knee arthroplasty components are intact. No acute periprosthetic fracture is identified. Small knee joint effusion is noted. Advanced atherosclerotic calcifications involving the popliteal artery and branch vessels. The quadriceps and patellar tendons are intact. Mild fatty atrophy of the knee musculature. IMPRESSION: 1. Intact total knee arthroplasty components. No acute periprosthetic fracture. 2. Small knee joint effusion. 3. Advanced atherosclerotic calcifications involving the popliteal artery and branch vessels. Electronically Signed   By: Rudie Meyer M.D.   On: 09/09/2019 05:08   MR BRAIN WO CONTRAST  Result Date: 09/09/2019 CLINICAL DATA:  TIA with right leg weakness EXAM: MRI HEAD WITHOUT CONTRAST TECHNIQUE: Multiplanar, multiecho pulse sequences of the brain and surrounding structures were obtained without intravenous contrast. COMPARISON:  CT head 09/09/2019 FINDINGS: Brain: Negative for acute infarct. Moderate atrophy and moderate chronic microvascular ischemic change. Chronic ischemic changes are present throughout the cerebral white matter. Chronic ischemia in the basal ganglia bilaterally and right cerebellum. Chronic microhemorrhage right thalamus and central pons. No mass or hydrocephalus identified. Vascular: Normal arterial flow voids Skull and upper cervical spine: No  focal skeletal lesion Sinuses/Orbits: Mild mucosal edema paranasal sinuses. Bilateral cataract extraction Other: None IMPRESSION: Negative for acute infarct Moderate atrophy and moderate chronic  ischemia. Electronically Signed   By: Marlan Palau M.D.   On: 09/09/2019 08:27   DG Knee Complete 4 Views Right  Result Date: 09/09/2019 CLINICAL DATA:  Pain EXAM: RIGHT KNEE - COMPLETE 4+ VIEW COMPARISON:  02/27/2011 FINDINGS: The patient is status post prior total knee. There is no acute displaced fracture. No dislocation. There is osteopenia. Vascular calcifications are noted. Nonspecific edema is noted. IMPRESSION: Negative. Electronically Signed   By: Katherine Mantle M.D.   On: 09/09/2019 01:19   DG Femur Portable Min 2 Views Right  Result Date: 09/09/2019 CLINICAL DATA:  Pain status post fall EXAM: RIGHT FEMUR PORTABLE 2 VIEW COMPARISON:  None. FINDINGS: Patient is status post prior total knee arthroplasty. There is nonspecific lower extremity edema. Vascular calcifications are noted. There is no acute displaced fracture or dislocation involving the right femur. IMPRESSION: 1. No acute displaced fracture or dislocation. 2. Status post total knee arthroplasty. 3. There is nonspecific lower extremity edema. Electronically Signed   By: Katherine Mantle M.D.   On: 09/09/2019 03:53   ASSESSMENT AND PLAN:   Larry Lawrence is a 79 y.o. male with medical history significant of hypertension, hyperlipidemia, CAD, TIA, gout, sCHF with EF  25-30%, CKD-3a, CAD, stent placement, V. tach, GI bleeding, who presents with generalized weakness and the right knee pain. Patient had a mechanical fall at home.  Fall and generalized weakness: and right knee pain - CT of head and MRI for brain are negative.  -Likely multifactorial etiology, including worsening renal function and new onset A. fib. -pT/OT--> recommends rehab however patient wants to go home. -pain control PO pain meds and lidocaine patch -orthopedic  consultation with Dr. Joice Lofts -CT right knee shows intact right knee hardware, no fracture--likley bruised from fall  Acute renal failure superimposed on stage 3a chronic kidney disease (HCC): Baseline Creat is 1.5-2.0, pt's Cre is 2.6 and BUN 52 on admission. Likely due to dehydration and continuation of diuretics - IVF: pt was started on 100 cc/h of NS -->will change to 50 cc/h due to EF of 25-30%. - creat 2.60--2.3 - Avoid using renal toxic medications, hypotension and contrast dye (or carefully use) - Hold dirurectics  Chronic systolic CHF (congestive heart failure) (HCC): - 2D echo on 07/20/2018 showed EF 25-30%.  Patient has 1+ chronic bilateral lower leg edema, but no worsening shortness of breath.  CHF seems to be compensated. -Hold home diuretics due to worsening renal function  Gout: -continue home allopurinol  Hx of TIA (transient ischemic attack): -on lipitor  CAD (coronary artery disease): s/p of stent. No CP -on lipitor, imdur and coreg  HTN (hypertension): -continue coreg and hydralazine -prn hydralazine  HLD (hyperlipidemia): -lipitor  Hx of V tach (HCC): -on coreg and amiodarone  Right knee pain: -prn oxycodone and Tylenol  Atrial fibrillation, new onset (HCC): CHA2DS2-VASc Score is 7,  but pt has hx of GIB. Will not start [facility] per Cardiology recommendation. - Dr Philemon Kingdom input noted -patient has history of wide complex tachycardia with history of V-tach and High PVC burden -continue coreg, amiodarone -2d echo  Leukocytosis: mild, WBC 11.7.  No fever.  No source of infection identified, likely reactive -f/u by CBC  Procedures:none Family communication : grandson Josh Consults : cardiology, orthopedic Discharge Disposition : home with home health likely on Sunday if creatinine continues to improve. CODE STATUS: DNR prior to admission DVT Prophylaxis : Lovenox Barriers to discharge: none  TOTAL TIME TAKING CARE OF THIS PATIENT: *30* minutes.   >50% time spent on counselling and coordination of care  Note: This dictation was prepared with Dragon dictation along with smaller phrase technology. Any transcriptional errors that result from this process are unintentional.  Enedina Finner M.D    [facility]   CC: Primary care physician; Duanne Limerick, MDPatient ID: Deneise Lever, male   DOB: [birth year], 79 y.o.   MRN: [id number]

## 2019-09-10 NOTE — Progress Notes (Signed)
*  PRELIMINARY RESULTS* Echocardiogram 2D Echocardiogram has been performed.  Larry Lawrence 09/10/2019, 8:58 AM

## 2019-09-10 NOTE — Progress Notes (Signed)
Patient suffers from congestive heart failure and right knee pain which impairs their ability to perform daily activities like walking, bathing, and toileting in the home.  A walker will not resolve issue with performing activities of daily living. A wheelchair will allow patient to safely perform daily activities. Patient is not able to propel themselves in the home using a standard weight wheelchair due to weakness. Patient can self propel in the lightweight wheelchair. Length of need lifetime. Accessories: elevating leg rests (ELRs), wheel locks, extensions and anti-tippers and back cushion.

## 2019-09-10 NOTE — Evaluation (Signed)
Occupational Therapy Evaluation Patient Details Name: Larry Lawrence MRN: [id number] DOB: October 14, 1940 Today's Date: 09/10/2019    History of Present Illness pt is a 79 yo presenting to ED after falling at home and now reporting R knee pain, imaging negative for stroke, acute intracranial abnormalities and fx  PMH includes HTN, HLD, CAD, TIA, CHF, right TKA   Clinical Impression   Patient agreeable to participate in OT session this date.  Verbalized decreased pain at rest (4/10) on R knee at rest.  Demonstrated less anxiety during bed mobility.  Able to move to EOB with MAX A and education/cues on body mechanics and sequencing.  Able to tolerate sitting EOB for ~10 minutes with intermittent UE support.  BP at 127/102 supine and 127/93 EOB.  No complaints of dizziness.  Attempted sit<>stand after 4 trials without success from elevated surface.  Patient continues to demonstrate limited AROM of R LE due to pain and swelling.  Assisted patient back to bed with MOD A.  MAX A x 2 to scoot patient up in bed and provide positioning.  BP noted at 124/75 at end of session.  Patient hopeful he will be able to stand up during next therapy session.  Patient would continue to benefit from skilled occupational therapy to address performance components outlined in evaluation.  Based on today's performance, recommending SNF.      Follow Up Recommendations  SNF;Other (comment)(SNF vs HH depending on progression with therapy)    Equipment Recommendations  None recommended by OT    Recommendations for Other Services       Precautions / Restrictions Precautions Precautions: Fall Restrictions Weight Bearing Restrictions: No      Mobility Bed Mobility Overal bed mobility: Needs Assistance Bed Mobility: Rolling;Supine to Sit;Sit to Supine Rolling: (MAX A to R side)   Supine to sit: Max assist;HOB elevated Sit to supine: Mod assist;HOB elevated   General bed mobility comments: Demonstrated improved  participation in bed mobility but continues to require assistance due to pain and limited AROM of R LE.  Transfers Overall transfer level: Needs assistance Equipment used: Rolling walker (2 wheeled) Transfers: Sit to/from Stand Sit to Stand: Max assist;From elevated surface         General transfer comment: Attempted sit<>stand on 4 trials with elevated bed.  Unable to come to complete stand despite assistance on each trial.    Balance   Sitting-balance support: Feet supported;No upper extremity supported Sitting balance-Leahy Scale: Fair Sitting balance - Comments: steady EOB                                   ADL either performed or assessed with clinical judgement   ADL Overall ADL's : Needs assistance/impaired                                             Vision Baseline Vision/History: Wears glasses Patient Visual Report: No change from baseline       Perception     Praxis      Pertinent Vitals/Pain Pain Assessment: 0-10 Pain Score: 4  Pain Location: R knee Pain Descriptors / Indicators: Sore;Discomfort;Grimacing;Aching Pain Intervention(s): Limited activity within patient's tolerance;Monitored during session;Repositioned     Hand Dominance     Extremity/Trunk Assessment Upper Extremity Assessment Upper Extremity Assessment: Overall WFL for  tasks assessed   Lower Extremity Assessment Lower Extremity Assessment: Defer to PT evaluation       Communication     Cognition Arousal/Alertness: Awake/alert Behavior During Therapy: WFL for tasks assessed/performed Overall Cognitive Status: Within Functional Limits for tasks assessed                                     General Comments       Exercises Other Exercises Other Exercises: Patient performed bed mobility with MAX A to reach EOB.  Attempted sit<>stand on 4 trials without success from elevated surface.  Returned to supine with MOD A.  MAX A x 2 to scoot  up in bed and position. Other Exercises: Provided general safety education on use of call light, bed controls, etc   Shoulder Instructions      Home Living                                          Prior Functioning/Environment                   OT Problem List: Decreased strength;Decreased activity tolerance;Impaired balance (sitting and/or standing);Pain      OT Treatment/Interventions: Self-care/ADL training;Therapeutic exercise;Energy conservation;Therapeutic activities;Patient/family education    OT Goals(Current goals can be found in the care plan section)    OT Frequency:     Barriers to D/C: Other (comment)(Requires increased assistance for mobility.  Unable to perform functional mobility at this time.)          Co-evaluation              AM-PAC OT "6 Clicks" Daily Activity     Outcome Measure Help from another person eating meals?: None Help from another person taking care of personal grooming?: None Help from another person toileting, which includes using toliet, bedpan, or urinal?: A Lot Help from another person bathing (including washing, rinsing, drying)?: A Lot Help from another person to put on and taking off regular upper body clothing?: A Little Help from another person to put on and taking off regular lower body clothing?: A Lot 6 Click Score: 17   End of Session Equipment Utilized During Treatment: Gait belt;Rolling walker  Activity Tolerance: Patient limited by pain Patient left: in bed;with call bell/phone within reach;with bed alarm set  OT Visit Diagnosis: History of falling (Z91.81)                Time: 8144-8185 OT Time Calculation (min): 38 min Charges:  OT General Charges $OT Visit: 1 Visit OT Treatments $Therapeutic Activity: 38-52 mins  Louanne Belton, MS, OTR/L 09/10/19, 10:39 AM

## 2019-09-10 NOTE — TOC Progression Note (Signed)
Transition of Care [facility]) - Progression Note    Patient Details  Name: Larry Lawrence MRN: [id number] Date of Birth: 11-06-1940  Transition of Care [facility]) CM/SW Contact  Allayne Butcher, RN Phone Number: 09/10/2019, 12:48 PM  Clinical Narrative:    Patient does not want to go to SNF, he requests that RNCM reach out and call his grandson, Sharia Reeve.  Josh reports that he does not want the patient to go to SNF either.  Plan for discharge will be return home with home health.  Sharia Reeve says that the patient will have plenty of help.  Wheelchair ordered and will be provided by [employer].  Feliberto Gottron with [facility] has accepted home health referral for PT, OT, and aide.   Anticipated discharge over the weekend.   Expected Discharge Plan: Home w Home Health Services Barriers to Discharge: Continued Medical Work up  Expected Discharge Plan and Services Expected Discharge Plan: Home w Home Health Services   Discharge Planning Services: CM Consult Post Acute Care Choice: Home Health, Durable Medical Equipment Living arrangements for the past 2 months: Single Family Home                 DME Arranged: Community education officer wheelchair with seat cushion DME Agency: [employer] Date DME Agency Contacted: 09/10/19 Time DME Agency Contacted: 1244 Representative spoke with at DME Agency: Mitchell Heir HH Arranged: PT, OT, Nurse's Aide HH Agency: [facility] Date HH Agency Contacted: 09/10/19 Time HH Agency Contacted: 1244 Representative spoke with at [facility] Agency: Wilnette Kales   Social Determinants of Health (SDOH) Interventions    Readmission Risk Interventions No flowsheet data found.

## 2019-09-11 DIAGNOSIS — I4891 Unspecified atrial fibrillation: Secondary | ICD-10-CM

## 2019-09-11 LAB — SYNOVIAL CELL COUNT + DIFF, W/ CRYSTALS
Crystals, Fluid: NONE SEEN
Eosinophils-Synovial: 2 %
Lymphocytes-Synovial Fld: 16 %
Monocyte-Macrophage-Synovial Fluid: 19 %
Neutrophil, Synovial: 63 %
WBC, Synovial: 781 /mm3 — ABNORMAL HIGH (ref 0–200)

## 2019-09-11 LAB — CBC
HCT: 35 % — ABNORMAL LOW (ref 39.0–52.0)
Hemoglobin: 11 g/dL — ABNORMAL LOW (ref 13.0–17.0)
MCH: 28.8 pg (ref 26.0–34.0)
MCHC: 31.4 g/dL (ref 30.0–36.0)
MCV: 91.6 fL (ref 80.0–100.0)
Platelets: 129 10*3/uL — ABNORMAL LOW (ref 150–400)
RBC: 3.82 MIL/uL — ABNORMAL LOW (ref 4.22–5.81)
RDW: 16.8 % — ABNORMAL HIGH (ref 11.5–15.5)
WBC: 7.2 10*3/uL (ref 4.0–10.5)
nRBC: 0 % (ref 0.0–0.2)

## 2019-09-11 LAB — GLUCOSE, CAPILLARY: Glucose-Capillary: 128 mg/dL — ABNORMAL HIGH (ref 70–99)

## 2019-09-11 LAB — BASIC METABOLIC PANEL
Anion gap: 10 (ref 5–15)
BUN: 50 mg/dL — ABNORMAL HIGH (ref 8–23)
CO2: 23 mmol/L (ref 22–32)
Calcium: 8.2 mg/dL — ABNORMAL LOW (ref 8.9–10.3)
Chloride: 104 mmol/L (ref 98–111)
Creatinine, Ser: 2.06 mg/dL — ABNORMAL HIGH (ref 0.61–1.24)
GFR calc Af Amer: 35 mL/min — ABNORMAL LOW (ref >60)
GFR calc non Af Amer: 30 mL/min — ABNORMAL LOW (ref >60)
Glucose, Bld: 127 mg/dL — ABNORMAL HIGH (ref 70–99)
Potassium: 3.3 mmol/L — ABNORMAL LOW (ref 3.5–5.1)
Sodium: 137 mmol/L (ref 135–145)

## 2019-09-11 LAB — C-REACTIVE PROTEIN: CRP: 7.4 mg/dL — ABNORMAL HIGH (ref <1.0)

## 2019-09-11 LAB — SEDIMENTATION RATE: Sed Rate: 37 mm/hr — ABNORMAL HIGH (ref 0–20)

## 2019-09-11 MED ORDER — POTASSIUM CHLORIDE CRYS ER 20 MEQ PO TBCR
20.0000 meq | EXTENDED_RELEASE_TABLET | Freq: Two times a day (BID) | ORAL | Status: DC
  Administered 2019-09-11 – 2019-09-13 (×5): 20 meq via ORAL
  Filled 2019-09-11 (×4): qty 1

## 2019-09-11 NOTE — Progress Notes (Signed)
[facility]  - [place]   PATIENT NAME: Larry Lawrence    MR#:  [id number]  DATE OF BIRTH:  05/13/1941  SUBJECTIVE:   Patient came in after he had a accidental fall with right knee buckling and hitting the floor without any head injury. Patient did not pass out. Complains of right knee pain--but improving. Has lidocaine patch+ no fever, chest pain shortness of breath Feels weak--wants to stay one more day REVIEW OF SYSTEMS:   Review of Systems  Constitutional: Negative for chills, fever and weight loss.  HENT: Negative for ear discharge, ear pain and nosebleeds.   Eyes: Negative for blurred vision, pain and discharge.  Respiratory: Negative for sputum production, shortness of breath, wheezing and stridor.   Cardiovascular: Negative for chest pain, palpitations, orthopnea and PND.  Gastrointestinal: Negative for abdominal pain, diarrhea, nausea and vomiting.  Genitourinary: Negative for frequency and urgency.  Musculoskeletal: Positive for joint pain. Negative for back pain.  Neurological: Negative for sensory change, speech change, focal weakness and weakness.  Psychiatric/Behavioral: Negative for depression and hallucinations. The patient is not nervous/anxious.    Tolerating Diet:yes Tolerating PT: recommends rehab however patient wishes to go home with home health  DRUG ALLERGIES:   Allergies  Allergen Reactions  . Niaspan [Niacin Er] Other (See Comments)    Reaction:  Hot flashes     VITALS:  Blood pressure 113/78, pulse (!) 103, temperature 100.3 F (37.9 C), resp. rate 19, height 5\' 11"  (1.803 m), weight 93.4 kg, SpO2 94 %.  PHYSICAL EXAMINATION:   Physical Exam  GENERAL:  79 y.o.-year-old patient lying in the bed with no acute distress.  EYES: Pupils equal, round, reactive to light and accommodation. No scleral icterus.   HEENT: Head atraumatic, normocephalic. Oropharynx and nasopharynx clear.  NECK:  Supple, no jugular venous  distention. No thyroid enlargement, no tenderness.  LUNGS: Normal breath sounds bilaterally, no wheezing, rales, rhonchi. No use of accessory muscles of respiration.  CARDIOVASCULAR: S1, S2 normal. No murmurs, rubs, or gallops.  ABDOMEN: Soft, nontender, nondistended. Bowel sounds present. No organomegaly or mass.  EXTREMITIES: No cyanosis, clubbing , decreased ROM right knee. No skin abrasion or brusising. No redness or swelling ++ chronic edema b/l.    NEUROLOGIC: Cranial nerves II through XII are intact. No focal Motor or sensory deficits b/l.   PSYCHIATRIC:  patient is alert and oriented x 3.  SKIN: No obvious rash, lesion, or ulcer.   LABORATORY PANEL:  CBC Recent Labs  Lab 09/11/19 0640  WBC 7.2  HGB 11.0*  HCT 35.0*  PLT 129*    Chemistries  Recent Labs  Lab 09/09/19 0320 09/10/19 0440 09/11/19 0640  NA 142   < > 137  K 3.6   < > 3.3*  CL 106   < > 104  CO2 26   < > 23  GLUCOSE 156*   < > 127*  BUN 52*   < > 50*  CREATININE 2.60*   < > 2.06*  CALCIUM 8.7*   < > 8.2*  AST 29  --   --   ALT 22  --   --   ALKPHOS 142*  --   --   BILITOT 1.6*  --   --    < > = values in this interval not displayed.   Cardiac Enzymes No results for input(s): TROPONINI in the last 168 hours. RADIOLOGY:  ECHOCARDIOGRAM COMPLETE  Result Date: 09/10/2019    ECHOCARDIOGRAM REPORT  Patient Name:   Larry Lawrence Date of Exam: 09/10/2019 Medical Rec #:  [id number]      Height:       71.0 in Accession #:    [id number]     Weight:       206.0 lb Date of Birth:  [birth year]      BSA:          2.135 m Patient Age:    79 years       BP:           139/93 mmHg Patient Gender: M              HR:           86 bpm. Exam Location:  [facility] Procedure: 2D Echo, Color Doppler and Cardiac Doppler Indications:     I48.91 Atrial Fibrillation  History:         Patient has prior history of Echocardiogram examinations. CHF,                  CAD, TIA; Arrythmias:Atrial Fibrillation.  Sonographer:     Humphrey Rolls RDCS  (AE) Referring Phys:  [id number] Vernetta Honey MANSY Diagnosing Phys: Arnoldo Hooker MD  Sonographer Comments: Suboptimal apical window and no subcostal window. IMPRESSIONS  1. Left ventricular ejection fraction, by estimation, is 20 to 25%. The left ventricle has severely decreased function. The left ventricle demonstrates global hypokinesis. The left ventricular internal cavity size was mildly dilated. Left ventricular diastolic parameters were normal.  2. Right ventricular systolic function is mildly reduced. The right ventricular size is moderately enlarged. There is moderately elevated pulmonary artery systolic pressure.  3. Left atrial size was moderately dilated.  4. Right atrial size was moderately dilated.  5. The mitral valve is normal in structure. Mild mitral valve regurgitation.  6. Tricuspid valve regurgitation is moderate.  7. The aortic valve is normal in structure. Aortic valve regurgitation is trivial. FINDINGS  Left Ventricle: Left ventricular ejection fraction, by estimation, is 20 to 25%. The left ventricle has severely decreased function. The left ventricle demonstrates global hypokinesis. The left ventricular internal cavity size was mildly dilated. There is no left ventricular hypertrophy. Left ventricular diastolic parameters were normal. Right Ventricle: The right ventricular size is moderately enlarged. No increase in right ventricular wall thickness. Right ventricular systolic function is mildly reduced. There is moderately elevated pulmonary artery systolic pressure. The tricuspid regurgitant velocity is 3.85 m/s, and with an assumed right atrial pressure of 10 mmHg, the estimated right ventricular systolic pressure is 69.3 mmHg. Left Atrium: Left atrial size was moderately dilated. Right Atrium: Right atrial size was moderately dilated. Pericardium: There is no evidence of pericardial effusion. Mitral Valve: The mitral valve is normal in structure. Mild mitral valve regurgitation. MV peak  gradient, 5.4 mmHg. The mean mitral valve gradient is 3.0 mmHg. Tricuspid Valve: The tricuspid valve is normal in structure. Tricuspid valve regurgitation is moderate. Aortic Valve: The aortic valve is normal in structure. Aortic valve regurgitation is trivial. Aortic valve mean gradient measures 5.0 mmHg. Aortic valve peak gradient measures 9.0 mmHg. Aortic valve area, by VTI measures 1.63 cm. Pulmonic Valve: The pulmonic valve was normal in structure. Pulmonic valve regurgitation is trivial. Aorta: The aortic root and ascending aorta are structurally normal, with no evidence of dilitation. IAS/Shunts: No atrial level shunt detected by color flow Doppler.  LEFT VENTRICLE PLAX 2D LVIDd:         5.53 cm  Diastology LVIDs:  4.76 cm  LV e' lateral:   9.25 cm/s LV PW:         0.98 cm  LV E/e' lateral: 11.7 LV IVS:        0.81 cm  LV e' medial:    4.35 cm/s LVOT diam:     2.40 cm  LV E/e' medial:  24.8 LV SV:         40 LV SV Index:   19 LVOT Area:     4.52 cm  RIGHT VENTRICLE RV Basal diam:  4.78 cm LEFT ATRIUM              Index       RIGHT ATRIUM           Index LA diam:        4.60 cm  2.15 cm/m  RA Area:     33.30 cm LA Vol (A2C):   95.6 ml  44.77 ml/m RA Volume:   131.00 ml 61.35 ml/m LA Vol (A4C):   108.0 ml 50.58 ml/m LA Biplane Vol: 104.0 ml 48.71 ml/m  AORTIC VALVE                    PULMONIC VALVE AV Area (Vmax):    1.89 cm     PV Vmax:       1.02 m/s AV Area (Vmean):   1.94 cm     PV Vmean:      70.600 cm/s AV Area (VTI):     1.63 cm     PV VTI:        0.183 m AV Vmax:           150.00 cm/s  PV Peak grad:  4.2 mmHg AV Vmean:          103.000 cm/s PV Mean grad:  2.0 mmHg AV VTI:            0.243 m AV Peak Grad:      9.0 mmHg AV Mean Grad:      5.0 mmHg LVOT Vmax:         62.80 cm/s LVOT Vmean:        44.100 cm/s LVOT VTI:          0.088 m LVOT/AV VTI ratio: 0.36  AORTA Ao Root diam: 3.30 cm MITRAL VALVE                TRICUSPID VALVE MV Area (PHT): 4.59 cm     TR Peak grad:   59.3 mmHg MV  Peak grad:  5.4 mmHg     TR Vmax:        385.00 cm/s MV Mean grad:  3.0 mmHg MV Vmax:       1.16 m/s     SHUNTS MV Vmean:      79.6 cm/s    Systemic VTI:  0.09 m MV Decel Time: 165 msec     Systemic Diam: 2.40 cm MV E velocity: 108.00 cm/s Arnoldo Hooker MD Electronically signed by Arnoldo Hooker MD Signature Date/Time: 09/10/2019/4:15:20 PM    Final    ASSESSMENT AND PLAN:   Larry Lawrence is a 78 y.o. male with medical history significant of hypertension, hyperlipidemia, CAD, TIA, gout, sCHF with EF 25-30%, CKD-3a, CAD, stent placement, V. tach, GI bleeding, who presents with generalized weakness and the right knee pain. Patient had a mechanical fall at home.  Fall and generalized weakness: and right knee pain - CT of head and MRI for  brain are negative.  -Likely multifactorial etiology, including worsening renal function and new onset A. fib. -pT/OT--> recommends rehab however patient wants to go home. -pain control PO pain meds and lidocaine patch -orthopedic consultation with Dr. Roland Rack appreciated --feels this is right knee sprain -CT right knee shows intact right knee hardware, no fracture--likley bruised from fall  Acute renal failure superimposed on stage 3a chronic kidney disease ([place]): Baseline Creat is 1.5-2.0, pt's Cre is 2.6 and BUN 52 on admission. Likely due to dehydration and continuation of diuretics - IVF: pt was started on 100 cc/h of NS -->will change to 50 cc/h due to EF of 25-30%. - creat 2.60--2.3--2.0 - Avoid using renal toxic medications, hypotension and contrast dye (or carefully use) - Hold diuretics  Hypokalemia -resume po  KCL  Chronic systolic CHF (congestive heart failure) ([place]): - 2D echo on 07/20/2018 showed EF 25-30%.  Patient has 1+ chronic bilateral lower leg edema, but no worsening shortness of breath.  CHF seems to be compensated. -Hold home diuretics due to worsening renal function -sats 94% RA  Gout: -continue home allopurinol  Hx of TIA  (transient ischemic attack): -on lipitor  CAD (coronary artery disease): s/p of stent. No CP -on lipitor, imdur and coreg  HTN (hypertension): -continue coreg and hydralazine -prn hydralazine  HLD (hyperlipidemia): -lipitor  Hx of V tach ([place]): -on coreg and amiodarone  Right knee pain: -prn oxycodone and Tylenol  Atrial fibrillation, new onset ([place]): CHA2DS2-VASc Score is 7,  but pt has hx of GIB. Will not start [facility] per Cardiology recommendation. - Dr Alveria Apley input noted -patient has history of wide complex tachycardia with history of V-tach and High PVC burden -continue coreg, amiodarone -2d echo  Leukocytosis: mild, WBC 11.7--8.2.  No fever.  No source of infection identified, likely reactive   Procedures:none Family communication : grandson Merrily Pew today Consults : cardiology, orthopedic Discharge Disposition : home with home health likely on Sunday if creatinine continues to improve. CODE STATUS: DNR prior to admission DVT Prophylaxis : Lovenox Barriers to discharge: none  TOTAL TIME TAKING CARE OF THIS PATIENT: *30* minutes.  >50% time spent on counselling and coordination of care  Note: This dictation was prepared with Dragon dictation along with smaller phrase technology. Any transcriptional errors that result from this process are unintentional.  Fritzi Mandes M.D    [facility]   CC: Primary care physician; Juline Patch, MDPatient ID: Jacklynn Ganong, male   DOB: 04-19-1941, 79 y.o.   MRN: [id number]

## 2019-09-11 NOTE — Progress Notes (Signed)
Patient ID: Larry Lawrence, male   DOB: [birth year], 79 y.o.   MRN: [id number]  Dr Joice Lofts aspirated about 10 mL of blood he from right knee. Sent to lab for analysis and culture. Fever 100.9 WBC 8.2K

## 2019-09-11 NOTE — TOC Progression Note (Signed)
Transition of Care [facility]) - Progression Note    Patient Details  Name: LAURIS SERVISS MRN: [id number] Date of Birth: 04/06/1941  Transition of Care [facility]) CM/SW Contact  Maud Deed, LCSW Phone Number:[phone] 09/11/2019, 4:06 PM  Clinical Narrative:    Spoke with pt and his grandson and provided them with current bed offers. They said they would like to discuss the options and will let me know by tomorrow.    CSW will continue to follow for discharge planning needs.     Expected Discharge Plan: Home w Home Health Services Barriers to Discharge: Continued Medical Work up  Expected Discharge Plan and Services Expected Discharge Plan: Home w Home Health Services   Discharge Planning Services: CM Consult Post Acute Care Choice: Home Health, Durable Medical Equipment Living arrangements for the past 2 months: Single Family Home                 DME Arranged: Community education officer wheelchair with seat cushion DME Agency: [employer] Date DME Agency Contacted: 09/10/19 Time DME Agency Contacted: 1244 Representative spoke with at DME Agency: Mitchell Heir HH Arranged: PT, OT, Nurse's Aide HH Agency: [facility] Date HH Agency Contacted: 09/10/19 Time HH Agency Contacted: 1244 Representative spoke with at [facility] Agency: Wilnette Kales   Social Determinants of Health (SDOH) Interventions    Readmission Risk Interventions No flowsheet data found.

## 2019-09-11 NOTE — Progress Notes (Signed)
Subjective: The patient notes some improvement in his right knee symptoms since yesterday, although he still notes moderate pain in the knee.  He feels that the ice has been helpful.  He denies any new symptoms or concerns.   Objective: Vital signs in last 24 hours: Temp:  [98 F (36.7 C)-100.3 F (37.9 C)] 100.3 F (37.9 C) (03/13 0828) Pulse Rate:  [95-103] 103 (03/13 0828) Resp:  [15-21] 19 (03/13 0828) BP: (107-139)/(70-92) 113/78 (03/13 0828) SpO2:  [93 %-97 %] 94 % (03/13 0828)  Intake/Output from previous day: 03/12 0701 - 03/13 0700 In: 2105.7 [P.O.:360; I.V.:1745.7] Out: 750 [Urine:750] Intake/Output this shift: Total I/O In: 120 [P.O.:120] Out: -   Recent Labs    09/09/19 0320 09/10/19 0440 09/11/19 0640  HGB 12.4* 11.4* 11.0*   Recent Labs    09/10/19 0440 09/11/19 0640  WBC 8.2 7.2  RBC 3.94* 3.82*  HCT 36.6* 35.0*  PLT 131* 129*   Recent Labs    09/10/19 0440 09/11/19 0640  NA 139 137  K 3.2* 3.3*  CL 106 104  CO2 24 23  BUN 49* 50*  CREATININE 2.37* 2.06*  GLUCOSE 131* 127*  CALCIUM 8.5* 8.2*   No results for input(s): LABPT, INR in the last 72 hours.  Physical Exam: On examination, the mild swelling noted around his right knee yesterday appears to be slightly improved, but he continues to have a small effusion.  In addition, there is some warmth around the knee.  He is able to tolerate knee flexion to 70 degrees this morning, but still is unable to perform an active straight leg raise without assistance.  His extensor mechanism does appear to be intact.  He is neurovascularly intact to the right lower extremity and foot.  Assessment: Right knee pain and swelling of unclear etiology status post right total knee arthroplasty.  Plan: The treatment options have been discussed with the patient.  Given the low-grade temperature he was noted to have this morning at 100.3 and a slightly elevated sed rate at 37, I feel it is reasonable to proceed  with a right knee aspiration, even though symptomatically he appears to be improving and his white count has decreased further since admission and compared to yesterday.  After obtaining verbal consent, approximately 10 cc of bloody fluid was aspirated from the knee using sterile technique.  This fluid was sent for a cell count and differential, crystals, culture and sensitivity, and Gram stain.  The patient may continue to be mobilized with physical therapy using a knee immobilizer if necessary and weightbearing as tolerated on the right leg.  In addition, ice should be applied frequently to the knee.   Excell Seltzer Enza Shone 09/11/2019, 10:32 AM

## 2019-09-11 NOTE — Progress Notes (Signed)
Physical Therapy Treatment Patient Details Name: Larry Lawrence MRN: [id number] DOB: [birth year] Today's Date: 09/11/2019    History of Present Illness pt is a 79 yo presenting to ED after falling at home and now reporting R knee pain, imaging negative for stroke, acute intracranial abnormalities and fx  PMH includes HTN, HLD, CAD, TIA, CHF, right TKA    PT Comments    Pt was long sitting in bed upon arriving. He is alert and cooperative throughout however has poor awareness of deficits. Pt presents with edema in all extremities and strength deficits throughout. He is very pleasant and able to follow commands consistently. Pt required heavy max assist to to perform supine to sitting and once sitting required min assist to prevent posterior LOB. Unable to maintain balance without min assist + BUE/BLE supported. Vitals while seated EOB: RR 22, HR 97 (A-Fib), O2 95% on rm air. BP 128/72. Pt reports no symptoms while seated EOB and willing to trial standing with +2 assist however unable to achieve full upright standing. Pt present with weakness throughout. Unsafe to trial standing 2nd trial due to weakness. He required max assist to return to supine and +2 to reposition to [facility]. Therapist highly recommends D/C to SNF to address deficits in strength and safe functional mobility. PT will continue to follow per POC. Pt was supine in bed with HOB el;evated, bed alarm placed and RN notified of pt's safety concerns. Therapist recommends + 2 assist for all mobility for safety.     Follow Up Recommendations  SNF     Equipment Recommendations  Rolling walker with 5" wheels    Recommendations for Other Services       Precautions / Restrictions Precautions Precautions: Fall Restrictions Weight Bearing Restrictions: No    Mobility  Bed Mobility Overal bed mobility: Needs Assistance Bed Mobility: Rolling;Supine to Sit;Sit to Supine Rolling: Max assist   Supine to sit: Max assist;HOB elevated Sit to  supine: Max assist;HOB elevated   General bed mobility comments: Therapist recommends +2 assist in future sessions. He required max assist to roll R to short sit and max assist with returning. INcreased time required with max vcs throughout for technique, sequencing, and safety. Pt presents with edema throughout all extremities. strength very limited.   Transfers Overall transfer level: Needs assistance Equipment used: Rolling walker (2 wheeled) Transfers: Sit to/from Stand Sit to Stand: +2 physical assistance;Max assist;From elevated surface         General transfer comment: Pt was unable to stand with max assist +2. gait belt used + elevated bed height. Unable to clear bottom. Not functional. unsafe to trial 2nd attempt.   Ambulation/Gait                 Stairs             Wheelchair Mobility    Modified Rankin (Stroke Patients Only)       Balance Overall balance assessment: Needs assistance;History of Falls Sitting-balance support: Feet supported;Bilateral upper extremity supported Sitting balance-Leahy Scale: Poor Sitting balance - Comments: Pt has several occasions of LOB posteriorly in sitting. Required constant min assist to prevent LOB.                                     Cognition Arousal/Alertness: Awake/alert Behavior During Therapy: WFL for tasks assessed/performed Overall Cognitive Status: Within Functional Limits for tasks assessed  General Comments: A&Ox4, pleasant and cooperative      Exercises      General Comments        Pertinent Vitals/Pain Pain Assessment: No/denies pain Pain Score: 0-No pain Pain Location: R knee Pain Descriptors / Indicators: Sore Pain Intervention(s): Limited activity within patient's tolerance;Monitored during session    Home Living                      Prior Function            PT Goals (current goals can now be found in the care  plan section) Acute Rehab PT Goals Patient Stated Goal: " I want to get better so I can go home" Progress towards PT goals: Not progressing toward goals - comment(strength deficits limiting progression)    Frequency    Min 2X/week      PT Plan Current plan remains appropriate    Co-evaluation              AM-PAC PT "6 Clicks" Mobility   Outcome Measure  Help needed turning from your back to your side while in a flat bed without using bedrails?: A Lot Help needed moving from lying on your back to sitting on the side of a flat bed without using bedrails?: A Lot Help needed moving to and from a bed to a chair (including a wheelchair)?: A Lot Help needed standing up from a chair using your arms (e.g., wheelchair or bedside chair)?: Total Help needed to walk in hospital room?: Total Help needed climbing 3-5 steps with a railing? : Total 6 Click Score: 9    End of Session Equipment Utilized During Treatment: Gait belt Activity Tolerance: Patient limited by fatigue(limited by strength/ safe functional mobility) Patient left: in bed;with call bell/phone within reach;with bed alarm set Nurse Communication: Mobility status PT Visit Diagnosis: Muscle weakness (generalized) (M62.81);Difficulty in walking, not elsewhere classified (R26.2);Pain Pain - Right/Left: Right Pain - part of body: Knee     Time: 1330-1355 PT Time Calculation (min) (ACUTE ONLY): 25 min  Charges:  $Therapeutic Activity: 23-37 mins                     Jetta Lout PTA 09/11/19, 2:21 PM

## 2019-09-12 LAB — BASIC METABOLIC PANEL
Anion gap: 9 (ref 5–15)
BUN: 49 mg/dL — ABNORMAL HIGH (ref 8–23)
CO2: 20 mmol/L — ABNORMAL LOW (ref 22–32)
Calcium: 8.2 mg/dL — ABNORMAL LOW (ref 8.9–10.3)
Chloride: 105 mmol/L (ref 98–111)
Creatinine, Ser: 2.08 mg/dL — ABNORMAL HIGH (ref 0.61–1.24)
GFR calc Af Amer: 35 mL/min — ABNORMAL LOW (ref >60)
GFR calc non Af Amer: 30 mL/min — ABNORMAL LOW (ref >60)
Glucose, Bld: 157 mg/dL — ABNORMAL HIGH (ref 70–99)
Potassium: 3.9 mmol/L (ref 3.5–5.1)
Sodium: 134 mmol/L — ABNORMAL LOW (ref 135–145)

## 2019-09-12 NOTE — TOC Progression Note (Signed)
Transition of Care [facility]) - Progression Note    Patient Details  Name: Larry Lawrence MRN: [id number] Date of Birth: 12/22/1940  Transition of Care [facility]) CM/SW Contact  Maud Deed, LCSW Phone Number:[phone] 09/12/2019, 3:03 PM  Clinical Narrative:    Dorathy Daft from [facility] returned my call. Said that Berkley Harvey will be started tomorrow.   Expected Discharge Plan: Home w Home Health Services Barriers to Discharge: Continued Medical Work up  Expected Discharge Plan and Services Expected Discharge Plan: Home w Home Health Services   Discharge Planning Services: CM Consult Post Acute Care Choice: Home Health, Durable Medical Equipment Living arrangements for the past 2 months: Single Family Home                 DME Arranged: Community education officer wheelchair with seat cushion DME Agency: [employer] Date DME Agency Contacted: 09/10/19 Time DME Agency Contacted: 1244 Representative spoke with at DME Agency: Mitchell Heir HH Arranged: PT, OT, Nurse's Aide HH Agency: [facility] Date HH Agency Contacted: 09/10/19 Time HH Agency Contacted: 1244 Representative spoke with at [facility] Agency: Wilnette Kales   Social Determinants of Health (SDOH) Interventions    Readmission Risk Interventions No flowsheet data found.

## 2019-09-12 NOTE — Progress Notes (Signed)
[facility] Cardiology [facility] Encounter Note  Patient: Larry Lawrence / Admit Date: 09/09/2019 / Date of Encounter: 09/12/2019, 7:45 AM   Subjective: Patient is stable at this time with no current evidence of congestive heart failure or anginal symptoms after a fall for which she appears to be recovering.  Knee appears to have strain with no fracture.  Heart rate control of atrial fibrillation reasonable at 80 to 90 bpm with carvedilol.  Patient has been on amiodarone although currently not remaining in normal sinus rhythm.  Patient has had amiodarone for ventricular tachycardia and preventricular contractions which appears to be working fairly well.  Previously patient has not been on anticoagulation due to significant fall risk and current fall of unknown etiology.  Troponin with minimal elevation consistent with demand ischemia rather than acute coronary syndrome  Review of Systems: Positive for: Shortness of breath knee pain Negative for: Vision change, hearing change, syncope, dizziness, nausea, vomiting,diarrhea, bloody stool, stomach pain, cough, congestion, diaphoresis, urinary frequency, urinary pain,skin lesions, skin rashes Others previously listed  Objective: Telemetry: Atrial fibrillation with controlled ventricular rate Physical Exam: Blood pressure (!) 138/104, pulse 93, temperature 98.9 F (37.2 C), temperature source Axillary, resp. rate (!) 25, height 5\' 11"  (1.803 m), weight 93.4 kg, SpO2 94 %. Body mass index is 28.73 kg/m. General: Well developed, well nourished, in no acute distress. Head: Normocephalic, atraumatic, sclera non-icteric, no xanthomas, nares are without discharge. Neck: No apparent masses Lungs: Normal respirations with few wheezes, no rhonchi, no rales , no crackles   Heart: irregular rate and rhythm, normal S1 S2, no murmur, no rub, no gallop, PMI is normal size and placement, carotid upstroke normal without bruit, jugular venous pressure  normal Abdomen: Soft, non-tender, non-distended with normoactive bowel sounds. No hepatosplenomegaly. Abdominal aorta is normal size without bruit Extremities  1+ edema, no clubbing, no cyanosis, no ulcers,  Peripheral: 2+ radial, 2+ femoral, 2+ dorsal pedal pulses Neuro: Alert and oriented. Moves all extremities spontaneously. Psych:  Responds to questions appropriately with a normal affect.   Intake/Output Summary (Last 24 hours) at 09/12/2019 0745 Last data filed at 09/12/2019 0500 Gross per 24 hour  Intake 1608.69 ml  Output --  Net 1608.69 ml    Inpatient Medications:  . allopurinol  300 mg Oral Daily  . amiodarone  200 mg Oral Daily  . atorvastatin  20 mg Oral Daily  . carvedilol  25 mg Oral BID  . enoxaparin (LOVENOX) injection  40 mg Subcutaneous Q24H  . hydrALAZINE  25 mg Oral Q8H  . isosorbide mononitrate  30 mg Oral Daily  . lidocaine  1 patch Transdermal Q24H  . montelukast  10 mg Oral QHS  . potassium chloride SA  20 mEq Oral BID  . timolol  1 drop Both Eyes BID   Infusions:  . sodium chloride 50 mL/hr at 09/12/19 0500    Labs: Recent Labs    09/10/19 0440 09/11/19 0640  NA 139 137  K 3.2* 3.3*  CL 106 104  CO2 24 23  GLUCOSE 131* 127*  BUN 49* 50*  CREATININE 2.37* 2.06*  CALCIUM 8.5* 8.2*   No results for input(s): AST, ALT, ALKPHOS, BILITOT, PROT, ALBUMIN in the last 72 hours. Recent Labs    09/10/19 0440 09/11/19 0640  WBC 8.2 7.2  HGB 11.4* 11.0*  HCT 36.6* 35.0*  MCV 92.9 91.6  PLT 131* 129*   No results for input(s): CKTOTAL, CKMB, TROPONINI in the last 72 hours. Invalid input(s): POCBNP No  results for input(s): HGBA1C in the last 72 hours.   Weights: Filed Weights   09/09/19 0031  Weight: 93.4 kg     Radiology/Studies:  CT Head Wo Contrast  Result Date: 09/09/2019 CLINICAL DATA:  Fall and leg weakness EXAM: CT HEAD WITHOUT CONTRAST TECHNIQUE: Contiguous axial images were obtained from the base of the skull through the vertex  without intravenous contrast. COMPARISON:  06/21/2018 FINDINGS: Brain: No evidence of acute infarction, hemorrhage, hydrocephalus, extra-axial collection or mass lesion/mass effect. Chronic small vessel ischemic gliosis that is confluent in the hemispheric white matter. 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 Brain atrophy with ventriculomegaly. Vascular: No hyperdense vessel or unexpected calcification. Skull: Negative for fracture. Sinuses/Orbits: Sclerotic mastoids from remote inflammation. High riding jugular bulb and or diverticulum on the right. IMPRESSION: 1. No acute finding or change from 2019. 2. Atrophy and chronic small vessel ischemia. Electronically Signed   By: Marnee Spring M.D.   On: 09/09/2019 06:37   CT Knee Right Wo Contrast  Result Date: 09/09/2019 CLINICAL DATA:  Larey Seat. Right knee pain. History of recent total knee arthroplasty. EXAM: CT OF THE right KNEE WITHOUT CONTRAST TECHNIQUE: Multidetector CT imaging of the right knee was performed according to the standard protocol. Multiplanar CT image reconstructions were also generated. COMPARISON:  Radiographs 09/09/2019 FINDINGS: The total knee arthroplasty components are intact. No acute periprosthetic fracture is identified. Small knee joint effusion is noted. Advanced atherosclerotic calcifications involving the popliteal artery and branch vessels. The quadriceps and patellar tendons are intact. Mild fatty atrophy of the knee musculature. IMPRESSION: 1. Intact total knee arthroplasty components. No acute periprosthetic fracture. 2. Small knee joint effusion. 3. Advanced atherosclerotic calcifications involving the popliteal artery and branch vessels. Electronically Signed   By: Rudie Meyer M.D.   On: 09/09/2019 05:08   MR BRAIN WO CONTRAST  Result Date: 09/09/2019 CLINICAL DATA:  TIA with right leg weakness EXAM: MRI HEAD WITHOUT CONTRAST TECHNIQUE: Multiplanar, multiecho  pulse sequences of the brain and surrounding structures were obtained without intravenous contrast. COMPARISON:  CT head 09/09/2019 FINDINGS: Brain: Negative for acute infarct. Moderate atrophy and moderate chronic microvascular ischemic change. Chronic ischemic changes are present throughout the cerebral white matter. Chronic ischemia in the basal ganglia bilaterally and right cerebellum. Chronic microhemorrhage right thalamus and central pons. No mass or hydrocephalus identified. Vascular: Normal arterial flow voids Skull and upper cervical spine: No focal skeletal lesion Sinuses/Orbits: Mild mucosal edema paranasal sinuses. Bilateral cataract extraction Other: None IMPRESSION: Negative for acute infarct Moderate atrophy and moderate chronic  ischemia. Electronically Signed   By: Marlan Palau M.D.   On: 09/09/2019 08:27   DG Knee Complete 4 Views Right  Result Date: 09/09/2019 CLINICAL DATA:  Pain EXAM: RIGHT KNEE - COMPLETE 4+ VIEW COMPARISON:  02/27/2011 FINDINGS: The patient is status post prior total knee. There is no acute displaced fracture. No dislocation. There is osteopenia. Vascular calcifications are noted. Nonspecific edema is noted. IMPRESSION: Negative. Electronically Signed   By: Katherine Mantle M.D.   On: 09/09/2019 01:19   ECHOCARDIOGRAM COMPLETE  Result Date: 09/10/2019    ECHOCARDIOGRAM REPORT   Patient Name:   KAGAN MUTCHLER Date of Exam: 09/10/2019 Medical Rec #:  [id number]      Height:       71.0 in Accession #:    [id number]     Weight:       206.0 lb Date of Birth:  September 11, 1940  BSA:          2.135 m Patient Age:    78 years       BP:           139/93 mmHg Patient Gender: M              HR:           86 bpm. Exam Location:  [facility] Procedure: 2D Echo, Color Doppler and Cardiac Doppler Indications:     I48.91 Atrial Fibrillation  History:         Patient has prior history of Echocardiogram examinations. CHF,                  CAD, TIA; Arrythmias:Atrial Fibrillation.  Sonographer:      Humphrey Rolls RDCS (AE) Referring Phys:  [id number] Vernetta Honey MANSY Diagnosing Phys: Arnoldo Hooker MD  Sonographer Comments: Suboptimal apical window and no subcostal window. IMPRESSIONS  1. Left ventricular ejection fraction, by estimation, is 20 to 25%. The left ventricle has severely decreased function. The left ventricle demonstrates global hypokinesis. The left ventricular internal cavity size was mildly dilated. Left ventricular diastolic parameters were normal.  2. Right ventricular systolic function is mildly reduced. The right ventricular size is moderately enlarged. There is moderately elevated pulmonary artery systolic pressure.  3. Left atrial size was moderately dilated.  4. Right atrial size was moderately dilated.  5. The mitral valve is normal in structure. Mild mitral valve regurgitation.  6. Tricuspid valve regurgitation is moderate.  7. The aortic valve is normal in structure. Aortic valve regurgitation is trivial. FINDINGS  Left Ventricle: Left ventricular ejection fraction, by estimation, is 20 to 25%. The left ventricle has severely decreased function. The left ventricle demonstrates global hypokinesis. The left ventricular internal cavity size was mildly dilated. There is no left ventricular hypertrophy. Left ventricular diastolic parameters were normal. Right Ventricle: The right ventricular size is moderately enlarged. No increase in right ventricular wall thickness. Right ventricular systolic function is mildly reduced. There is moderately elevated pulmonary artery systolic pressure. The tricuspid regurgitant velocity is 3.85 m/s, and with an assumed right atrial pressure of 10 mmHg, the estimated right ventricular systolic pressure is 69.3 mmHg. Left Atrium: Left atrial size was moderately dilated. Right Atrium: Right atrial size was moderately dilated. Pericardium: There is no evidence of pericardial effusion. Mitral Valve: The mitral valve is normal in structure. Mild mitral valve  regurgitation. MV peak gradient, 5.4 mmHg. The mean mitral valve gradient is 3.0 mmHg. Tricuspid Valve: The tricuspid valve is normal in structure. Tricuspid valve regurgitation is moderate. Aortic Valve: The aortic valve is normal in structure. Aortic valve regurgitation is trivial. Aortic valve mean gradient measures 5.0 mmHg. Aortic valve peak gradient measures 9.0 mmHg. Aortic valve area, by VTI measures 1.63 cm. Pulmonic Valve: The pulmonic valve was normal in structure. Pulmonic valve regurgitation is trivial. Aorta: The aortic root and ascending aorta are structurally normal, with no evidence of dilitation. IAS/Shunts: No atrial level shunt detected by color flow Doppler.  LEFT VENTRICLE PLAX 2D LVIDd:         5.53 cm  Diastology LVIDs:         4.76 cm  LV e' lateral:   9.25 cm/s LV PW:         0.98 cm  LV E/e' lateral: 11.7 LV IVS:        0.81 cm  LV e' medial:    4.35 cm/s LVOT diam:     2.40  cm  LV E/e' medial:  24.8 LV SV:         40 LV SV Index:   19 LVOT Area:     4.52 cm  RIGHT VENTRICLE RV Basal diam:  4.78 cm LEFT ATRIUM              Index       RIGHT ATRIUM           Index LA diam:        4.60 cm  2.15 cm/m  RA Area:     33.30 cm LA Vol (A2C):   95.6 ml  44.77 ml/m RA Volume:   131.00 ml 61.35 ml/m LA Vol (A4C):   108.0 ml 50.58 ml/m LA Biplane Vol: 104.0 ml 48.71 ml/m  AORTIC VALVE                    PULMONIC VALVE AV Area (Vmax):    1.89 cm     PV Vmax:       1.02 m/s AV Area (Vmean):   1.94 cm     PV Vmean:      70.600 cm/s AV Area (VTI):     1.63 cm     PV VTI:        0.183 m AV Vmax:           150.00 cm/s  PV Peak grad:  4.2 mmHg AV Vmean:          103.000 cm/s PV Mean grad:  2.0 mmHg AV VTI:            0.243 m AV Peak Grad:      9.0 mmHg AV Mean Grad:      5.0 mmHg LVOT Vmax:         62.80 cm/s LVOT Vmean:        44.100 cm/s LVOT VTI:          0.088 m LVOT/AV VTI ratio: 0.36  AORTA Ao Root diam: 3.30 cm MITRAL VALVE                TRICUSPID VALVE MV Area (PHT): 4.59 cm     TR Peak  grad:   59.3 mmHg MV Peak grad:  5.4 mmHg     TR Vmax:        385.00 cm/s MV Mean grad:  3.0 mmHg MV Vmax:       1.16 m/s     SHUNTS MV Vmean:      79.6 cm/s    Systemic VTI:  0.09 m MV Decel Time: 165 msec     Systemic Diam: 2.40 cm MV E velocity: 108.00 cm/s Arnoldo HookerBruce Jaeveon [place] MD Electronically signed by Arnoldo HookerBruce Othmar Ringer MD Signature Date/Time: 09/10/2019/4:15:20 PM    Final    DG Femur Portable Min 2 Views Right  Result Date: 09/09/2019 CLINICAL DATA:  Pain status post fall EXAM: RIGHT FEMUR PORTABLE 2 VIEW COMPARISON:  None. FINDINGS: Patient is status post prior total knee arthroplasty. There is nonspecific lower extremity edema. Vascular calcifications are noted. There is no acute displaced fracture or dislocation involving the right femur. IMPRESSION: 1. No acute displaced fracture or dislocation. 2. Status post total knee arthroplasty. 3. There is nonspecific lower extremity edema. Electronically Signed   By: Katherine Mantlehristopher  Green M.D.   On: 09/09/2019 03:53     Assessment and Recommendation  79 y.o. male with known dilated cardiomyopathy and chronic congestive heart failure chronic kidney disease stage III coronary atherosclerosis and paroxysmal  nonvalvular atrial fibrillation and chronic significant PVCs with an episode of a fall of unknown etiology and no current evidence of severe injury requiring further intervention and now without congestive heart failure exacerbation or myocardial infarction 1.  Continue supportive care of fall and begin rehabilitation 2.  Continue current medical regimen including carvedilol and amiodarone for heart rate control of atrial fibrillation and will further assess need for adjustments of medication management and/or maintenance of normal sinus rhythm as outpatient 3.  No anticoagulation at this time due to severe fall risk and concerns of bleeding risk and/or patient wishes 4.  No further cardiac diagnostics necessary at this time due to no evidence of angina myocardial  infarction and/or congestive heart failure exacerbation 5.  Continue telemetry while patient is ambulating following for any rhythm disturbances that may contribute to fall 6.  Follow-up as outpatient for further adjustments of medication management after rehabilitation from above 7.  Call if further questions  Signed, Serafina Royals M.D. FACC

## 2019-09-12 NOTE — Progress Notes (Signed)
[facility]  - [place] at [facility]   PATIENT NAME: Larry Lawrence    MR#:  [id number]  DATE OF BIRTH:  11/19/1940  SUBJECTIVE:   Patient came in after he had a accidental fall with right knee buckling and hitting the floor without any head injury. Patient did not pass out. Complains of right knee pain--but improving. Has lidocaine patch+ no fever, chest pain shortness of breath  According to PT patient is to pursue nurses. His right knee feels a lot better after aspiration. I had low-grade temperature of 100.1 at 1 am  REVIEW OF SYSTEMS:   Review of Systems  Constitutional: Negative for chills, fever and weight loss.  HENT: Negative for ear discharge, ear pain and nosebleeds.   Eyes: Negative for blurred vision, pain and discharge.  Respiratory: Negative for sputum production, shortness of breath, wheezing and stridor.   Cardiovascular: Negative for chest pain, palpitations, orthopnea and PND.  Gastrointestinal: Negative for abdominal pain, diarrhea, nausea and vomiting.  Genitourinary: Negative for frequency and urgency.  Musculoskeletal: Positive for joint pain. Negative for back pain.  Neurological: Negative for sensory change, speech change, focal weakness and weakness.  Psychiatric/Behavioral: Negative for depression and hallucinations. The patient is not nervous/anxious.    Tolerating Diet:yes Tolerating PT: recommends rehab however patient wishes to go home with home health but now convinced to try rehab  DRUG ALLERGIES:   Allergies  Allergen Reactions  . Niaspan [Niacin Er] Other (See Comments)    Reaction:  Hot flashes     VITALS:  Blood pressure 125/88, pulse 92, temperature 97.7 F (36.5 C), temperature source Oral, resp. rate (!) 25, height 5\' 11"  (1.803 m), weight 93.4 kg, SpO2 97 %.  PHYSICAL EXAMINATION:   Physical Exam  GENERAL:  79 y.o.-year-old patient lying in the bed with no acute distress.  EYES: Pupils equal, round, reactive to  light and accommodation. No scleral icterus.   HEENT: Head atraumatic, normocephalic. Oropharynx and nasopharynx clear.  NECK:  Supple, no jugular venous distention. No thyroid enlargement, no tenderness.  LUNGS: Normal breath sounds bilaterally, no wheezing, rales, rhonchi. No use of accessory muscles of respiration.  CARDIOVASCULAR: S1, S2 normal. No murmurs, rubs, or gallops.  ABDOMEN: Soft, nontender, nondistended. Bowel sounds present. No organomegaly or mass.  EXTREMITIES: No cyanosis, clubbing , decreased ROM right knee. No skin abrasion or brusising. No redness or swelling ++ chronic edema b/l.    NEUROLOGIC: Cranial nerves II through XII are intact. No focal Motor or sensory deficits b/l.   PSYCHIATRIC:  patient is alert and oriented x 3.  SKIN: No obvious rash, lesion, or ulcer.   LABORATORY PANEL:  CBC Recent Labs  Lab 09/11/19 0640  WBC 7.2  HGB 11.0*  HCT 35.0*  PLT 129*    Chemistries  Recent Labs  Lab 09/09/19 0320 09/10/19 0440 09/12/19 0906  NA 142   < > 134*  K 3.6   < > 3.9  CL 106   < > 105  CO2 26   < > 20*  GLUCOSE 156*   < > 157*  BUN 52*   < > 49*  CREATININE 2.60*   < > 2.08*  CALCIUM 8.7*   < > 8.2*  AST 29  --   --   ALT 22  --   --   ALKPHOS 142*  --   --   BILITOT 1.6*  --   --    < > = values in this interval not  displayed.   Cardiac Enzymes No results for input(s): TROPONINI in the last 168 hours. RADIOLOGY:  No results found. ASSESSMENT AND PLAN:   Larry Lawrence is a 79 y.o. male with medical history significant of hypertension, hyperlipidemia, CAD, TIA, gout, sCHF with EF 25-30%, CKD-3a, CAD, stent placement, V. tach, GI bleeding, who presents with generalized weakness and the right knee pain. Patient had a mechanical fall at home.  Fall and generalized weakness: and right knee pain/sprina - CT of head and MRI for brain are negative.  -Likely multifactorial etiology, including worsening renal function and new onset A.  fib. -pT/OT--> recommends rehab however patient wants to go home. -pain control PO pain meds and lidocaine patch -orthopedic consultation with Dr. Roland Rack appreciated --feels this is right knee sprain. -10 cc of fluid aspirated from right knee. No growth so far. -CT right knee shows intact right knee hardware, no fracture--likley bruised from fall  Acute renal failure superimposed on stage 3a chronic kidney disease ([place]): Baseline Creat is 1.5-2.0, pt's Cre is 2.6 and BUN 52 on admission. Likely due to dehydration and continuation of diuretics - IVF: pt was started on 100 cc/h of NS -->will change to 50 cc/h due to EF of 25-30%--d/c it - creat 2.60--2.3--2.0 - Avoid using renal toxic medications, hypotension and contrast dye (or carefully use) - Holding diuretics will likely reume in am  Hypokalemia -resume po  KCL  Chronic systolic CHF (congestive heart failure) ([place]): - 2D echo on 07/20/2018 showed EF 25-30%.  Patient has 1+ chronic bilateral lower leg edema, but no worsening shortness of breath.  CHF seems to be compensated. -sats 94% RA  Gout: -continue home allopurinol  Hx of TIA (transient ischemic attack): -on lipitor  CAD (coronary artery disease): s/p of stent. No CP -on lipitor, imdur and coreg  HTN (hypertension): -continue coreg and hydralazine -prn hydralazine  HLD (hyperlipidemia): -lipitor  Hx of V tach ([place]): -on coreg and amiodarone  Right knee pain: -prn oxycodone and Tylenol  Atrial fibrillation, new onset ([place]): CHA2DS2-VASc Score is 7,  but pt has hx of GIB. Not a candidate for anticoagulation per Cardiology recommendation. - Dr Alveria Apley input noted -patient has history of wide complex tachycardia with history of V-tach and High PVC burden -continue coreg, amiodarone  Leukocytosis: mild, WBC 11.7--8.2.   Procedures:none Family communication : message grandson Merrily Pew today Consults : cardiology, orthopedic Discharge Disposition : rehab once  bed available . TOC working for it CODE STATUS: DNR prior to admission DVT Prophylaxis : Lovenox Barriers to discharge: none  TOTAL TIME TAKING CARE OF THIS PATIENT: *30* minutes.  >50% time spent on counselling and coordination of care  Note: This dictation was prepared with Dragon dictation along with smaller phrase technology. Any transcriptional errors that result from this process are unintentional.  Fritzi Mandes M.D    [facility]   CC: Primary care physician; Juline Patch, MDPatient ID: Larry Lawrence, male   DOB: 1940-08-12, 79 y.o.   MRN: [id number]

## 2019-09-12 NOTE — TOC Progression Note (Signed)
Transition of Care [facility]) - Progression Note    Patient Details  Name: Larry Lawrence MRN: [id number] Date of Birth: [birth year]  [facility] CM/SW Contact  Maud Deed, LCSW Phone [phone] 09/12/2019, 1:39 PM  Clinical Narrative:    CSW spoke with pt and his grandson about bed choice. They agreed to go with [facility] for rehab. CSW called Verlon Au at [facility] and left voicemail to verify bed availability.   TOC will continue to follow for discharge planning needs.    Expected Discharge Plan: Home w Home Health Services Barriers to Discharge: Continued Medical Work up  Expected Discharge Plan and Services Expected Discharge Plan: Home w Home Health Services   Discharge Planning Services: CM Consult Post Acute Care Choice: Home Health, Durable Medical Equipment Living arrangements for the past 2 months: Single Family Home                 DME Arranged: Community education officer wheelchair with seat cushion DME Agency: [employer] Date DME Agency Contacted: 09/10/19 Time DME Agency Contacted: 1244 Representative spoke with at DME Agency: Mitchell Heir HH Arranged: PT, OT, Nurse's Aide HH Agency: [facility] Date HH Agency Contacted: 09/10/19 Time HH Agency Contacted: 1244 Representative spoke with at [facility] Agency: Wilnette Kales   Social Determinants of Health (SDOH) Interventions    Readmission Risk Interventions No flowsheet data found.

## 2019-09-12 NOTE — Progress Notes (Signed)
Subjective: The patient notes that his knee is feeling better today.  He has less swelling and more mobility in the knee.  He was able to get up to the chair with assistance this morning.  Objective: Vital signs in last 24 hours: Temp:  [97.7 F (36.5 C)-100.1 F (37.8 C)] 97.7 F (36.5 C) (03/14 0901) Pulse Rate:  [92-105] 92 (03/14 0901) Resp:  [21-25] 25 (03/13 2106) BP: (125-138)/(82-104) 125/88 (03/14 0901) SpO2:  [92 %-97 %] 97 % (03/14 0901)  Intake/Output from previous day: 03/13 0701 - 03/14 0700 In: 1608.7 [P.O.:360; I.V.:1248.7] Out: -  Intake/Output this shift: No intake/output data recorded.  Recent Labs    09/10/19 0440 09/11/19 0640  HGB 11.4* 11.0*   Recent Labs    09/10/19 0440 09/11/19 0640  WBC 8.2 7.2  RBC 3.94* 3.82*  HCT 36.6* 35.0*  PLT 131* 129*   Recent Labs    09/11/19 0640 09/12/19 0906  NA 137 134*  K 3.3* 3.9  CL 104 105  CO2 23 20*  BUN 50* 49*  CREATININE 2.06* 2.08*  GLUCOSE 127* 157*  CALCIUM 8.2* 8.2*   No results for input(s): LABPT, INR in the last 72 hours.  Physical Exam: On examination, the swelling and effusion is much improved as compared to yesterday.  He has at most a trace effusion and only minimal tenderness to palpation around the knee.  The warmth felt yesterday also appears to be less pronounced.  He is able tolerate range of motion from 0 to 90 degrees.  He is neurovascularly intact to the right lower extremity and foot.  Assessment: Right knee pain and swelling status post total knee arthroplasty, improving symptomatically.  Plan: The patient's cell count from the knee aspiration is less than 800.  Therefore, it is highly unlikely that he has any infection in his knee at this time.  Most likely he has simply sprained his knee and that his symptoms are gradually improving.  He may continue to be mobilized with therapy, weightbearing as tolerated on the right leg.  I would also continue to apply ice frequently  to the knee.  I will sign off on this patient at this time.  He may follow-up with Dr. Ernest Pine or his PA in 2 to 3 weeks if necessary.  Please call me if you have any further questions or concerns.   Excell Seltzer Amoree Newlon 09/12/2019, 12:57 PM

## 2019-09-13 DIAGNOSIS — Z7401 Bed confinement status: Secondary | ICD-10-CM | POA: Diagnosis not present

## 2019-09-13 DIAGNOSIS — R5381 Other malaise: Secondary | ICD-10-CM | POA: Diagnosis not present

## 2019-09-13 DIAGNOSIS — D72829 Elevated white blood cell count, unspecified: Secondary | ICD-10-CM | POA: Diagnosis not present

## 2019-09-13 DIAGNOSIS — N179 Acute kidney failure, unspecified: Secondary | ICD-10-CM | POA: Diagnosis not present

## 2019-09-13 DIAGNOSIS — L8945 Pressure ulcer of contiguous site of back, buttock and hip, unstageable: Secondary | ICD-10-CM

## 2019-09-13 DIAGNOSIS — I2510 Atherosclerotic heart disease of native coronary artery without angina pectoris: Secondary | ICD-10-CM | POA: Diagnosis not present

## 2019-09-13 DIAGNOSIS — G4733 Obstructive sleep apnea (adult) (pediatric): Secondary | ICD-10-CM | POA: Diagnosis not present

## 2019-09-13 DIAGNOSIS — R262 Difficulty in walking, not elsewhere classified: Secondary | ICD-10-CM | POA: Diagnosis not present

## 2019-09-13 DIAGNOSIS — N183 Chronic kidney disease, stage 3 unspecified: Secondary | ICD-10-CM | POA: Diagnosis not present

## 2019-09-13 DIAGNOSIS — W19XXXA Unspecified fall, initial encounter: Secondary | ICD-10-CM | POA: Diagnosis not present

## 2019-09-13 DIAGNOSIS — M109 Gout, unspecified: Secondary | ICD-10-CM | POA: Diagnosis not present

## 2019-09-13 DIAGNOSIS — E876 Hypokalemia: Secondary | ICD-10-CM | POA: Diagnosis not present

## 2019-09-13 DIAGNOSIS — D649 Anemia, unspecified: Secondary | ICD-10-CM | POA: Diagnosis not present

## 2019-09-13 DIAGNOSIS — I4891 Unspecified atrial fibrillation: Secondary | ICD-10-CM | POA: Diagnosis not present

## 2019-09-13 DIAGNOSIS — L8990 Pressure ulcer of unspecified site, unspecified stage: Secondary | ICD-10-CM | POA: Insufficient documentation

## 2019-09-13 DIAGNOSIS — I6523 Occlusion and stenosis of bilateral carotid arteries: Secondary | ICD-10-CM | POA: Diagnosis not present

## 2019-09-13 DIAGNOSIS — I509 Heart failure, unspecified: Secondary | ICD-10-CM | POA: Diagnosis not present

## 2019-09-13 DIAGNOSIS — I5022 Chronic systolic (congestive) heart failure: Secondary | ICD-10-CM | POA: Diagnosis not present

## 2019-09-13 DIAGNOSIS — W19XXXD Unspecified fall, subsequent encounter: Secondary | ICD-10-CM | POA: Diagnosis not present

## 2019-09-13 DIAGNOSIS — Z743 Need for continuous supervision: Secondary | ICD-10-CM | POA: Diagnosis not present

## 2019-09-13 DIAGNOSIS — I130 Hypertensive heart and chronic kidney disease with heart failure and stage 1 through stage 4 chronic kidney disease, or unspecified chronic kidney disease: Secondary | ICD-10-CM | POA: Diagnosis not present

## 2019-09-13 DIAGNOSIS — R6889 Other general symptoms and signs: Secondary | ICD-10-CM | POA: Diagnosis not present

## 2019-09-13 DIAGNOSIS — E039 Hypothyroidism, unspecified: Secondary | ICD-10-CM | POA: Diagnosis not present

## 2019-09-13 DIAGNOSIS — G4700 Insomnia, unspecified: Secondary | ICD-10-CM | POA: Diagnosis not present

## 2019-09-13 DIAGNOSIS — N1831 Chronic kidney disease, stage 3a: Secondary | ICD-10-CM | POA: Diagnosis not present

## 2019-09-13 DIAGNOSIS — M2550 Pain in unspecified joint: Secondary | ICD-10-CM | POA: Diagnosis not present

## 2019-09-13 DIAGNOSIS — I480 Paroxysmal atrial fibrillation: Secondary | ICD-10-CM | POA: Diagnosis not present

## 2019-09-13 DIAGNOSIS — S8391XD Sprain of unspecified site of right knee, subsequent encounter: Secondary | ICD-10-CM | POA: Diagnosis not present

## 2019-09-13 DIAGNOSIS — H409 Unspecified glaucoma: Secondary | ICD-10-CM | POA: Diagnosis not present

## 2019-09-13 DIAGNOSIS — E785 Hyperlipidemia, unspecified: Secondary | ICD-10-CM | POA: Diagnosis not present

## 2019-09-13 DIAGNOSIS — L89151 Pressure ulcer of sacral region, stage 1: Secondary | ICD-10-CM | POA: Diagnosis not present

## 2019-09-13 DIAGNOSIS — Z96651 Presence of right artificial knee joint: Secondary | ICD-10-CM | POA: Diagnosis not present

## 2019-09-13 LAB — RESPIRATORY PANEL BY RT PCR (FLU A&B, COVID)
Influenza A by PCR: NEGATIVE
Influenza B by PCR: NEGATIVE
SARS Coronavirus 2 by RT PCR: NEGATIVE

## 2019-09-13 MED ORDER — HYDRALAZINE HCL 25 MG PO TABS: 25.0000 mg | ORAL_TABLET | Freq: Three times a day (TID) | ORAL | Status: DC

## 2019-09-13 MED ORDER — CARVEDILOL 12.5 MG PO TABS: 25.0000 mg | ORAL_TABLET | Freq: Two times a day (BID) | ORAL | 0 refills | Status: AC

## 2019-09-13 MED ORDER — TORSEMIDE 20 MG PO TABS
40.0000 mg | ORAL_TABLET | Freq: Every day | ORAL | Status: DC
  Administered 2019-09-13: 12:00:00 40 mg via ORAL
  Filled 2019-09-13: qty 2

## 2019-09-13 MED ORDER — OXYCODONE HCL 5 MG PO TABS: 5.0000 mg | ORAL_TABLET | Freq: Four times a day (QID) | ORAL | 0 refills | Status: DC | PRN

## 2019-09-13 NOTE — Progress Notes (Signed)
Physical Therapy Treatment Patient Details Name: Larry Lawrence MRN: [id number] DOB: [birth year] Today's Date: 09/13/2019    History of Present Illness pt is a 79 yo presenting to ED after falling at home and now reporting R knee pain, imaging negative for stroke, acute intracranial abnormalities and fx  PMH includes HTN, HLD, CAD, TIA, CHF, right TKA    PT Comments    Pt sitting EOB upon PT entrance. With total/maxA adjustment, positioning improved and pt balance improved (pt exhibited strong posterior lean, once adjusted able to prop UE and knees in sitting to maintain balance). Sit <> stand attempted twice, with RW and maxA, unable to come fully to standing, buttocks cleared from bed second attempt. Further attempts at mobility deferred due to pt fatigue. Supine to sit with maxAx2 and scooted up in bed totalAx2. Pt left with all needs in reach with family and RN at bedside. Overall the patient demonstrated slow progression towards goals, and would benefit from further skilled PT intervention to return to PLOF as able. Current recommendation remains appropriate.    Follow Up Recommendations  SNF     Equipment Recommendations  Rolling walker with 5" wheels    Recommendations for Other Services       Precautions / Restrictions Precautions Precautions: Fall Restrictions Weight Bearing Restrictions: Yes RLE Weight Bearing: Weight bearing as tolerated    Mobility  Bed Mobility Overal bed mobility: Needs Assistance Bed Mobility: Supine to Sit;Sit to Supine     Supine to sit: Max assist;HOB elevated Sit to supine: Max assist;+2 for physical assistance   General bed mobility comments: Pt with posterior lean during sitting, with repositioning (maxA/totalA) pt able to improve sitting balance, but fatigued quickly  Transfers Overall transfer level: Needs assistance Equipment used: Rolling walker (2 wheeled) Transfers: Sit to/from Stand Sit to Stand: +2 physical assistance;Max  assist;From elevated surface         General transfer comment: Unable to perform full sit to stand with MAX A x 2.  Able to clear bottom from EOB.  2 trials.  Ambulation/Gait             General Gait Details: unable at this time   Stairs             Wheelchair Mobility    Modified Rankin (Stroke Patients Only)       Balance Overall balance assessment: Needs assistance;History of Falls Sitting-balance support: Feet supported;Bilateral upper extremity supported Sitting balance-Leahy Scale: Poor Sitting balance - Comments: Very heavy posterior lean noted.  REquired MAX A to maintain sitting balance. Improved with adjustment of positioning at EOB. Postural control: Posterior lean                                  Cognition Arousal/Alertness: Awake/alert Behavior During Therapy: WFL for tasks assessed/performed Overall Cognitive Status: Within Functional Limits for tasks assessed                                 General Comments: A&Ox4, pleasant and cooperative      Exercises Other Exercises Other Exercises: Patient performed bed mobility with MAX A to reach EOB.  Attempted sit<>stand on 2 trials without success from elevated surface with MAX A x2.  Returned to supine with MAX A x 2.  MAX A x 2 to scoot up in bed and position. Other Exercises:  Provided education on body mechanics, sequencing and pacing during all activities.    General Comments General comments (skin integrity, edema, etc.): B LEs still with edema      Pertinent Vitals/Pain Pain Assessment: No/denies pain Pain Location: Patient with no complaints of pain during session    Home Living                      Prior Function            PT Goals (current goals can now be found in the care plan section) Progress towards PT goals: Progressing toward goals(slowly)    Frequency    Min 2X/week      PT Plan Current plan remains appropriate     Co-evaluation PT/OT/SLP Co-Evaluation/Treatment: Yes Reason for Co-Treatment: Complexity of the patient's impairments (multi-system involvement);Necessary to address cognition/behavior during functional activity;For patient/therapist safety;To address functional/ADL transfers PT goals addressed during session: Mobility/safety with mobility;Balance;Strengthening/ROM OT goals addressed during session: ADL's and self-care;Strengthening/ROM;Proper use of Adaptive equipment and DME      AM-PAC PT "6 Clicks" Mobility   Outcome Measure  Help needed turning from your back to your side while in a flat bed without using bedrails?: A Lot Help needed moving from lying on your back to sitting on the side of a flat bed without using bedrails?: A Lot Help needed moving to and from a bed to a chair (including a wheelchair)?: Total Help needed standing up from a chair using your arms (e.g., wheelchair or bedside chair)?: Total Help needed to walk in hospital room?: Total Help needed climbing 3-5 steps with a railing? : Total 6 Click Score: 8    End of Session Equipment Utilized During Treatment: Gait belt Activity Tolerance: Patient limited by fatigue Patient left: in bed;with call bell/phone within reach;with bed alarm set;with family/visitor present;with nursing/sitter in room Nurse Communication: Mobility status PT Visit Diagnosis: Muscle weakness (generalized) (M62.81);Difficulty in walking, not elsewhere classified (R26.2);Pain Pain - Right/Left: Right Pain - part of body: Knee     Time: 3845-3646 PT Time Calculation (min) (ACUTE ONLY): 14 min  Charges:  $Therapeutic Exercise: 8-22 mins                    Olga Coaster PT, DPT 12:47 PM,09/13/19

## 2019-09-13 NOTE — Discharge Summary (Addendum)
[facility] [place] [facility]   PATIENT NAME: Larry Lawrence    MR#:  [id number]  DATE OF BIRTH:  09-07-1940  DATE OF ADMISSION:  09/09/2019 ADMITTING PHYSICIAN: Hannah Beat, MD  DATE OF DISCHARGE: 09/13/2019  PRIMARY CARE PHYSICIAN: Duanne Limerick, MD    ADMISSION DIAGNOSIS:  Near syncope [R55] Unable to ambulate [R26.2] Ambulatory dysfunction [R26.2] Atrial fibrillation, unspecified type (HCC) [I48.91]  DISCHARGE DIAGNOSIS:  Right Knee pain and swelling post fall--mechanical at home-- Right knee sprain Acute on Chronic renal failure--stage 3a improving SECONDARY DIAGNOSIS:   Past Medical History:  Diagnosis Date  . Allergy   . CHF (congestive heart failure) (HCC)   . Coronary artery disease   . Gout   . Insomnia   . Renal disorder   . Sleeps in sitting position due to orthopnea   . TIA (transient ischemic attack)     HOSPITAL COURSE:  Larry Carver Parkeris a 79 y.o.malewith medical history significant ofhypertension, hyperlipidemia, CAD, TIA, gout,sCHF with EF 25-30%, CKD-3a, CAD, stent placement, V. tach, GI bleeding, who presents with generalized weakness and the right knee pain. Patient had a mechanical fall at home.  Falland generalized weakness: and right knee pain/sprain -CT of head and MRI for brain are negative. -Likely multifactorial etiology,including worsening renal function and new onset A. fib. -pT/OT--> recommends rehab however patient wants to go home. -pain control PO pain meds and lidocaine patch -orthopedic consultation with Dr. Joice Lofts appreciated --feels this is right knee sprain. -10 cc of fluid aspirated from right knee. No growth so far. -CT right knee shows intact right knee hardware, no fracture--likley bruised from fall  Acute renal failure superimposed on stage 3a chronic kidney disease (HCC):Baseline Creat is1.5-2.0, pt's Cre is2.6 and BUN 52on admission. Likely due to dehydration and continuation of  diuretics -received IVF - creat 2.60--2.3--2.0 - Avoid using renal toxic medications, hypotension and contrast dye (or carefully use) - resumed tosemide  Hypokalemia -resumed po  KCL  Chronic systolic CHF (congestive heart failure) (HCC): -2D echo on 07/20/2018 showed EF 25-30%.Patient has 1+ chronic bilateral lower leg edema, but no worsening shortness of breath. CHF seems to be compensated. -sats 94% RA  Gout: -continue homeallopurinol  Hx ofTIA (transient ischemic attack): -on lipitor  CAD (coronary artery disease):s/p of stent. No CP -on lipitor, imdur and coreg  HTN (hypertension): -continue coreg andhydralazine -prnhydralazine  HLD (hyperlipidemia): -lipitor  Hx ofV tach (HCC): -on coreg andamiodarone  Right knee pain: -prnoxycodone and Tylenol  Atrial fibrillation, new onset (HCC):CHA2DS2-VASc Scoreis 7,  but pt has hx of GIB. Not a candidate for anticoagulation per Cardiology recommendation. - Dr Philemon Kingdom input noted -patient has history of wide complex tachycardia with history of V-tach and High PVC burden -continue coreg,amiodarone  Leukocytosis:mild, WBC 11.7--8.2.   Pressure injury as documented on the cocyx  Patient is medically best at baseline for discharge. Pt and family agreeable for Rehab  Procedures: right knee aspirate at bedside Family communication : spoke with Legrand Como Consults : cardiology, orthopedic Discharge Disposition : To [facility] CODE STATUS: DNR prior to admission DVT Prophylaxis : Lovenox Barriers to discharge: pending insurance auth   CONSULTS OBTAINED:  Treatment Team:  Christena Flake, MD  DRUG ALLERGIES:   Allergies  Allergen Reactions  . Niaspan [Niacin Er] Other (See Comments)    Reaction:  Hot flashes     DISCHARGE MEDICATIONS:   Allergies as of 09/13/2019      Reactions   Niaspan [niacin  Er] Other (See Comments)   Reaction:  Hot flashes       Medication List     STOP taking these medications   furosemide 40 MG tablet Commonly known as: Lasix   mupirocin ointment 2 % Commonly known as: Bactroban     TAKE these medications   allopurinol 300 MG tablet Commonly known as: ZYLOPRIM TAKE (1) TABLET BY MOUTH a day   amiodarone 200 MG tablet Commonly known as: PACERONE Take 200 mg by mouth daily. Dr Gwen Pounds   atorvastatin 20 MG tablet Commonly known as: LIPITOR Take 20 mg by mouth daily.   carvedilol 12.5 MG tablet Commonly known as: Coreg Take 2 tablets (25 mg total) by mouth 2 (two) times daily. Dr Gwen Pounds   hydrALAZINE 25 MG tablet Commonly known as: APRESOLINE Take 1 tablet (25 mg total) by mouth every 8 (eight) hours. Dr Gwen Pounds   isosorbide mononitrate 30 MG 24 hr tablet Commonly known as: IMDUR Take 30 mg by mouth daily. kowalski   montelukast 10 MG tablet Commonly known as: SINGULAIR TAKE (1) TABLET BY MOUTH DAILY AT BEDTIME   oxyCODONE 5 MG immediate release tablet Commonly known as: Oxy IR/ROXICODONE Take 1 tablet (5 mg total) by mouth every 6 (six) hours as needed for severe pain.   potassium chloride SA 20 MEQ tablet Commonly known as: KLOR-CON Take 1 tablet (20 mEq total) by mouth 2 (two) times daily. What changed: additional instructions   timolol 0.5 % ophthalmic solution Commonly known as: TIMOPTIC Place 1 drop into both eyes 2 (two) times daily. Dr Inez Pilgrim   torsemide 20 MG tablet Commonly known as: DEMADEX Take 40 mg by mouth daily. Dr Phil Dopp Medical Equipment  (From admission, onward)         Start     Ordered   09/10/19 1229  For home use only DME lightweight manual wheelchair with seat cushion  Once    Comments: Patient suffers from congestive heart failure and right knee pain which impairs their ability to perform daily activities like walking, bathing, and toileting in the home.  A walker will not resolve issue with performing activities of daily living. A wheelchair  will allow patient to safely perform daily activities. Patient is not able to propel themselves in the home using a standard weight wheelchair due to weakness. Patient can self propel in the lightweight wheelchair. Length of need lifetime. Accessories: elevating leg rests (ELRs), wheel locks, extensions and anti-tippers and back cushion.   09/10/19 1232          If you experience worsening of your admission symptoms, develop shortness of breath, life threatening emergency, suicidal or homicidal thoughts you must seek medical attention immediately by calling 911 or calling your MD immediately  if symptoms less severe.  You Must read complete instructions/literature along with all the possible adverse reactions/side effects for all the Medicines you take and that have been prescribed to you. Take any new Medicines after you have completely understood and accept all the possible adverse reactions/side effects.   Please note  You were cared for by a hospitalist during your hospital stay. If you have any questions about your discharge medications or the care you received while you were in the hospital after you are discharged, you can call the unit and asked to speak with the hospitalist on call if the hospitalist that took care of you is not available. Once you are discharged,  your primary care physician will handle any further medical issues. Please note that NO REFILLS for any discharge medications will be authorized once you are discharged, as it is imperative that you return to your primary care physician (or establish a relationship with a primary care physician if you do not have one) for your aftercare needs so that they can reassess your need for medications and monitor your lab values. Today   SUBJECTIVE   Doing well. No new complaints. Eating BF  VITAL SIGNS:  Blood pressure (!) 132/95, pulse 100, temperature 99.7 F (37.6 C), temperature source Oral, resp. rate 20, height 5\' 11"  (1.803  m), weight 93.4 kg, SpO2 95 %.  I/O:    Intake/Output Summary (Last 24 hours) at 09/13/2019 0817 Last data filed at 09/13/2019 0600 Gross per 24 hour  Intake 240 ml  Output 425 ml  Net -185 ml    PHYSICAL EXAMINATION:  GENERAL:  78 y.o.-year-old patient lying in the bed with no acute distress.  EYES: Pupils equal, round, reactive to light and accommodation. No scleral icterus.  HEENT: Head atraumatic, normocephalic. Oropharynx and nasopharynx clear.  NECK:  Supple, no jugular venous distention. No thyroid enlargement, no tenderness.  LUNGS: Normal breath sounds bilaterally, no wheezing, rales,rhonchi or crepitation. No use of accessory muscles of respiration.  CARDIOVASCULAR: S1, S2 normal. No murmurs, rubs, or gallops.  ABDOMEN: Soft, non-tender, non-distended. Bowel sounds present. No organomegaly or mass.  EXTREMITIES: chronic +pedal edema, no cyanosis, or clubbing. Mild right knee swelling. Skin no redness NEUROLOGIC: Cranial nerves II through XII are intact. Muscle strength 5/5 in all extremities. Sensation intact. Gait not checked.  PSYCHIATRIC: The patient is alert and oriented x 3.  SKIN: as below Pressure Injury 09/12/19 Coccyx Medial Stage 1 -  Intact skin with non-blanchable redness of a localized area usually over a bony prominence. non-blanchable redness to sacral area (Active)  09/12/19 2155  Location: Coccyx  Location Orientation: Medial  Staging: Stage 1 -  Intact skin with non-blanchable redness of a localized area usually over a bony prominence.  Wound Description (Comments): non-blanchable redness to sacral area  Present on Admission: No   DATA REVIEW:   CBC  Recent Labs  Lab 09/11/19 0640  WBC 7.2  HGB 11.0*  HCT 35.0*  PLT 129*    Chemistries  Recent Labs  Lab 09/09/19 0320 09/10/19 0440 09/12/19 0906  NA 142   < > 134*  K 3.6   < > 3.9  CL 106   < > 105  CO2 26   < > 20*  GLUCOSE 156*   < > 157*  BUN 52*   < > 49*  CREATININE 2.60*   < >  2.08*  CALCIUM 8.7*   < > 8.2*  AST 29  --   --   ALT 22  --   --   ALKPHOS 142*  --   --   BILITOT 1.6*  --   --    < > = values in this interval not displayed.    Microbiology Results   Recent Results (from the past 240 hour(s))  SARS CORONAVIRUS 2 (TAT 6-24 HRS) Nasopharyngeal Nasopharyngeal Swab     Status: None   Collection Time: 09/09/19  9:02 AM   Specimen: Nasopharyngeal Swab  Result Value Ref Range Status   SARS Coronavirus 2 NEGATIVE NEGATIVE Final    Comment: (NOTE) SARS-CoV-2 target nucleic acids are NOT DETECTED. The SARS-CoV-2 RNA is generally detectable in upper and lower  respiratory specimens during the acute phase of infection. Negative results do not preclude SARS-CoV-2 infection, do not rule out co-infections with other pathogens, and should not be used as the sole basis for treatment or other patient management decisions. Negative results must be combined with clinical observations, patient history, and epidemiological information. The expected result is Negative. Fact Sheet for Patients: SugarRoll.be Fact Sheet for Healthcare Providers: https://www.woods-mathews.com/ This test is not yet approved or cleared by the [place] FDA and  has been authorized for detection and/or diagnosis of SARS-CoV-2 by FDA under an Emergency Use Authorization (EUA). This EUA will remain  in effect (meaning this test can be used) for the duration of the COVID-19 declaration under Section 56 4(b)(1) of the Act, 21 U.S.C. section 360bbb-3(b)(1), unless the authorization is terminated or revoked sooner. Performed at [facility], [place] [address], [place], [place] [address]   Body fluid culture     Status: None (Preliminary result)   Collection Time: 09/11/19 10:25 AM   Specimen: KNEE; Body Fluid  Result Value Ref Range Status   Specimen Description   Final    KNEE Performed at [facility], [address],  [place], [place] [address]    Special Requests   Final    NONE Performed at [facility], [place], [place] [address]    Gram Stain   Final    RARE WBC PRESENT,BOTH PMN AND MONONUCLEAR NO ORGANISMS SEEN    Culture   Final    NO GROWTH < 24 HOURS Performed at [facility], [place] [address], [place], [place] [address]    Report Status PENDING  Incomplete    RADIOLOGY:  No results found.   CODE STATUS:     Code Status Orders  (From admission, onward)         Start     Ordered   09/09/19 0815  Do not attempt resuscitation (DNR)  Continuous     09/09/19 0814        Code Status History    Date Active Date Inactive Code Status Order ID Comments User Context   09/09/2019 0650 09/09/2019 0814 Full Code [id number]  Mansy, Arvella Merles, MD ED   07/19/2018 1140 07/22/2018 1956 DNR [id number]  Nicholes Mango, MD ED   06/30/2018 2241 07/03/2018 1650 DNR [id number]  Dustin Flock, MD Inpatient   04/24/2017 0337 04/25/2017 1740 DNR [id number]  Salary, Avel Peace, MD Inpatient   04/24/2017 0249 04/24/2017 0337 DNR [id number]  Saundra Shelling, MD ED   02/01/2016 2149 02/05/2016 1646 DNR [id number]  Lance Coon, MD ED   08/09/2015 1644 08/11/2015 1654 DNR [id number]  Lytle Butte, MD ED   08/09/2015 1643 08/09/2015 1644 Full Code [id number]  Lytle Butte, MD ED   02/20/2015 [phone] 02/21/2015 1916 DNR [id number]  Juluis Mire, MD Inpatient   Advance Care Planning Activity    Advance Directive Documentation     Most Recent Value  Type of Advance Directive  Healthcare Power of Attorney, Living will  Pre-existing out of facility DNR order (yellow form or pink MOST form)  --  "MOST" Form in Place?  --       TOTAL TIME TAKING CARE OF THIS PATIENT: *40* minutes.    Fritzi Mandes M.D  [facility]    CC: Primary care physician; Juline Patch, MD

## 2019-09-13 NOTE — Discharge Planning (Signed)
IV removed.  RN assessment and VS revealed stability for DC to SNF.  Report called and s/w Cheryle Horsfall, RN at [facility].  DC papers printed plus scripts, and signed golden rod DNR placed in packet for facility.  EMS called to transport to room 507. Waiting on arrival.

## 2019-09-13 NOTE — TOC Progression Note (Signed)
Transition of Care [facility]) - Progression Note    Patient Details  Name: Larry Lawrence MRN: [id number] Date of Birth: 02-28-1941  [facility] CM/SW Contact  Allayne Butcher, RN Phone Number: 09/13/2019, 10:57 AM  Clinical Narrative:    Insurance authorization approved- Reference # [id number], approved for 3 days start day today 09/13/19- next review 09/15/19, care coordinator is Norva Pavlov.     Expected Discharge Plan: Skilled Nursing Facility Barriers to Discharge: Insurance Authorization  Expected Discharge Plan and Services Expected Discharge Plan: Skilled Nursing Facility   Discharge Planning Services: CM Consult Post Acute Care Choice: Skilled Nursing Facility Living arrangements for the past 2 months: Single Family Home Expected Discharge Date: 09/13/19               DME Arranged: Community education officer wheelchair with seat cushion DME Agency: [employer] Date DME Agency Contacted: 09/10/19 Time DME Agency Contacted: 1244 Representative spoke with at DME Agency: Mitchell Heir HH Arranged: PT, OT, Nurse's Aide HH Agency: [facility] Date HH Agency Contacted: 09/10/19 Time HH Agency Contacted: 1244 Representative spoke with at [facility] Agency: Wilnette Kales   Social Determinants of Health (SDOH) Interventions    Readmission Risk Interventions No flowsheet data found.

## 2019-09-13 NOTE — Care Management Important Message (Signed)
Important Message  Patient Details  Name: Larry Lawrence MRN: [id number] Date of Birth: Nov 04, 1940   Medicare Important Message Given:  Yes     Olegario Messier A Kayliana Codd 09/13/2019, 11:00 AM

## 2019-09-13 NOTE — TOC Progression Note (Signed)
Transition of Care [facility]) - Progression Note    Patient Details  Name: Larry Lawrence MRN: [id number] Date of Birth: [birth year]  Transition of Care [facility]) CM/SW Contact  Allayne Butcher, RN Phone Number: 09/13/2019, 10:47 AM  Clinical Narrative:    Plan for patient to discharge to SNF today.  Insurance authorization is pending, patient and family have chosen [employer] forces operational [employer].  Respiratory panel pending for DC, facility needs updated negative COVID result.     Expected Discharge Plan: Skilled Nursing Facility Barriers to Discharge: Insurance Authorization  Expected Discharge Plan and Services Expected Discharge Plan: Skilled Nursing Facility   Discharge Planning Services: CM Consult Post Acute Care Choice: Skilled Nursing Facility Living arrangements for the past 2 months: Single Family Home Expected Discharge Date: 09/13/19               DME Arranged: Community education officer wheelchair with seat cushion DME Agency: [employer] Date DME Agency Contacted: 09/10/19 Time DME Agency Contacted: 1244 Representative spoke with at DME Agency: Mitchell Heir HH Arranged: PT, OT, Nurse's Aide HH Agency: [facility] Date HH Agency Contacted: 09/10/19 Time HH Agency Contacted: 1244 Representative spoke with at [facility] Agency: Wilnette Kales   Social Determinants of Health (SDOH) Interventions    Readmission Risk Interventions No flowsheet data found.

## 2019-09-13 NOTE — Progress Notes (Signed)
Occupational Therapy Treatment Patient Details Name: Larry Lawrence MRN: [id number] DOB: [birth year] Today's Date: 09/13/2019    History of present illness pt is a 79 yo presenting to ED after falling at home and now reporting R knee pain, imaging negative for stroke, acute intracranial abnormalities and fx  PMH includes HTN, HLD, CAD, TIA, CHF, right TKA   OT comments  Patient supine in bed upon entry with grandson, Josh, at beside.  Patient agreeable to participate in therapy.  Able to transition to EOB with MAX A, extra time and HOB elevated.  Patient verbalized decreased pain but demonstrated less ability to participate physically.  Demonstrated poor sitting balance at EOB often required MAX A to maintain upright position. Partial co-tx with PT to attempt sit to stand.  Co-tx needed for optimal safety d/t physical assist and equipment.  Attempted 2 trials; able to clear bottom from EOB but unable to fully stand. BP remained WFL.  HR up to 117 with activity.  Assisted patient back to supine and positioned with MAX A x 2 with all needs in reach and RN present.  Updating follow up recommendation to SNF due to increased amount of assist needed and limited progression with therapy.      Follow Up Recommendations  SNF;Supervision/Assistance - 24 hour(Recommending SNF only due to limited progression)    Equipment Recommendations  Other (comment)(defer to next level of care)    Recommendations for Other Services      Precautions / Restrictions Precautions Precautions: Fall Restrictions Weight Bearing Restrictions: Yes RLE Weight Bearing: Weight bearing as tolerated       Mobility Bed Mobility Overal bed mobility: Needs Assistance Bed Mobility: Supine to Sit;Sit to Supine     Supine to sit: Max assist;HOB elevated Sit to supine: Max assist;+2 for physical assistance   General bed mobility comments: Patient demonstrated decreased sitting balance this date.  Often required MAX A to  maintain upright posture.  DEmonstrated less pain in R knee, but more difficulty with bed mobility.  Transfers Overall transfer level: Needs assistance Equipment used: Rolling walker (2 wheeled) Transfers: Sit to/from Stand Sit to Stand: +2 physical assistance;Max assist;From elevated surface         General transfer comment: Unable to perform full sit to stand with MAX A x 2.  Able to clear bottom from EOB.  2 trials.    Balance Overall balance assessment: Needs assistance;History of Falls Sitting-balance support: Feet supported;Bilateral upper extremity supported Sitting balance-Leahy Scale: Poor Sitting balance - Comments: Very heavy posterior lean noted.  REquired MAX A to maintain sitting balance. Postural control: Posterior lean                                 ADL either performed or assessed with clinical judgement   ADL Overall ADL's : Needs assistance/impaired                                             Vision Patient Visual Report: No change from baseline     Perception     Praxis      Cognition Arousal/Alertness: Awake/alert Behavior During Therapy: WFL for tasks assessed/performed Overall Cognitive Status: Within Functional Limits for tasks assessed  General Comments: A&Ox4, pleasant and cooperative        Exercises Other Exercises Other Exercises: Patient performed bed mobility with MAX A to reach EOB.  Attempted sit<>stand on 2 trials without success from elevated surface with MAX A x2.  Returned to supine with MAX A x 2.  MAX A x 2 to scoot up in bed and position. Other Exercises: Provided education on body mechanics, sequencing and pacing during all activities.   Shoulder Instructions       General Comments B LEs still with edema    Pertinent Vitals/ Pain       Pain Assessment: No/denies pain Pain Location: Patient with no complaints of pain during session  Home  Living                                          Prior Functioning/Environment              Frequency  Min 1X/week        Progress Toward Goals  OT Goals(current goals can now be found in the care plan section)  Progress towards OT goals: OT to reassess next treatment     Plan Frequency remains appropriate;Discharge plan needs to be updated    Co-evaluation                 AM-PAC OT "6 Clicks" Daily Activity     Outcome Measure                    End of Session Equipment Utilized During Treatment: Gait belt;Rolling walker  OT Visit Diagnosis: History of falling (Z91.81)   Activity Tolerance Patient limited by fatigue;Other (comment)(Limited due to weakness)   Patient Left in bed;with call bell/phone within reach;with bed alarm set;with nursing/sitter in room;with family/visitor present   Nurse Communication          Time: 1610-9604 OT Time Calculation (min): 29 min  Charges: OT General Charges $OT Visit: 1 Visit OT Treatments $Therapeutic Activity: 8-22 mins  Louanne Belton, MS, OTR/L 09/13/19, 12:20 PM

## 2019-09-13 NOTE — TOC Transition Note (Signed)
Transition of Care [facility]) - CM/SW Discharge Note   Patient Details  Name: Larry Lawrence MRN: [id number] Date of Birth: [birth year]  Transition of Care [facility]) CM/SW Contact:  Allayne Butcher, RN Phone Number: 09/13/2019, 12:54 PM   Clinical Narrative:     Patient is medically cleared for discharge to [facility] today.  Patient will go to room 507, bedside RN to call report to [phone].   RNCM will set up EMS transport.  Grandson notified about discharge.   Final next level of care: Skilled Nursing Facility Barriers to Discharge: Barriers Resolved   Patient Goals and CMS Choice Patient states their goals for this hospitalization and ongoing recovery are:: wants to go home CMS Medicare.gov Compare Post Acute Care list provided to:: Patient Choice offered to / list presented to : Patient  Discharge Placement              Patient chooses bed at: [facility] Patient to be transferred to facility by: [facility] Name of family member notified: Legrand Como- grandson Patient and family notified of of transfer: 09/13/19  Discharge Plan and Services   Discharge Planning Services: CM Consult Post Acute Care Choice: Skilled Nursing Facility          DME Arranged: Lightweight manual wheelchair with seat cushion DME Agency: [employer] Date DME Agency Contacted: 09/10/19 Time DME Agency Contacted: 1244 Representative spoke with at DME Agency: Mitchell Heir HH Arranged: PT, OT, Nurse's Aide HH Agency: [facility] Date HH Agency Contacted: 09/10/19 Time HH Agency Contacted: 1244 Representative spoke with at [facility] Agency: Wilnette Kales  Social Determinants of Health (SDOH) Interventions     Readmission Risk Interventions No flowsheet data found.

## 2019-09-14 LAB — BODY FLUID CULTURE: Culture: NO GROWTH

## 2019-09-15 DIAGNOSIS — I5022 Chronic systolic (congestive) heart failure: Secondary | ICD-10-CM | POA: Diagnosis not present

## 2019-09-15 DIAGNOSIS — N183 Chronic kidney disease, stage 3 unspecified: Secondary | ICD-10-CM | POA: Diagnosis not present

## 2019-09-15 DIAGNOSIS — M109 Gout, unspecified: Secondary | ICD-10-CM | POA: Diagnosis not present

## 2019-09-15 DIAGNOSIS — R262 Difficulty in walking, not elsewhere classified: Secondary | ICD-10-CM | POA: Diagnosis not present

## 2019-09-15 DIAGNOSIS — I480 Paroxysmal atrial fibrillation: Secondary | ICD-10-CM | POA: Diagnosis not present

## 2019-09-28 DIAGNOSIS — S8391XD Sprain of unspecified site of right knee, subsequent encounter: Secondary | ICD-10-CM | POA: Diagnosis not present

## 2019-09-28 DIAGNOSIS — Z96651 Presence of right artificial knee joint: Secondary | ICD-10-CM | POA: Diagnosis not present

## 2019-09-28 IMAGING — US US RENAL
1 series · 14 of 25 positions shown · non-contrast
Comparison: 11/05/2009 CT

CLINICAL DATA: 77-year-old male with acute renal insufficiency.
Initial encounter.

EXAM:
RENAL / URINARY TRACT ULTRASOUND COMPLETE

[Series 1: us renal · 0.28mm/px · 14 of 31 slices shown]
[im 1/31]
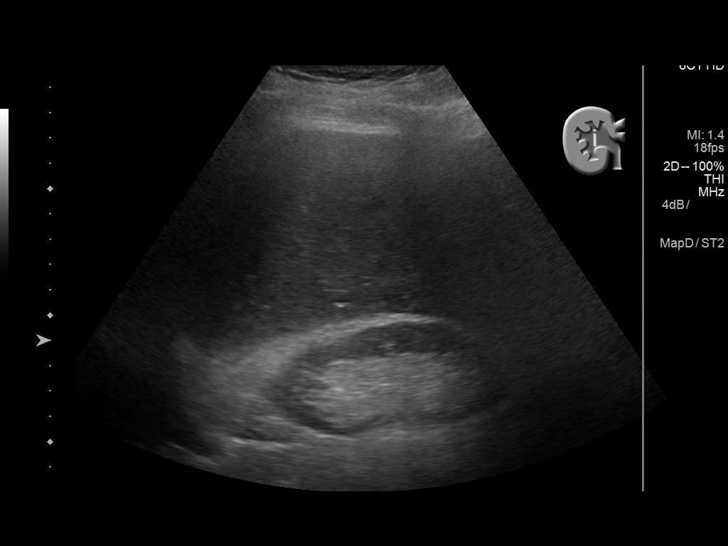
[im 3/31]
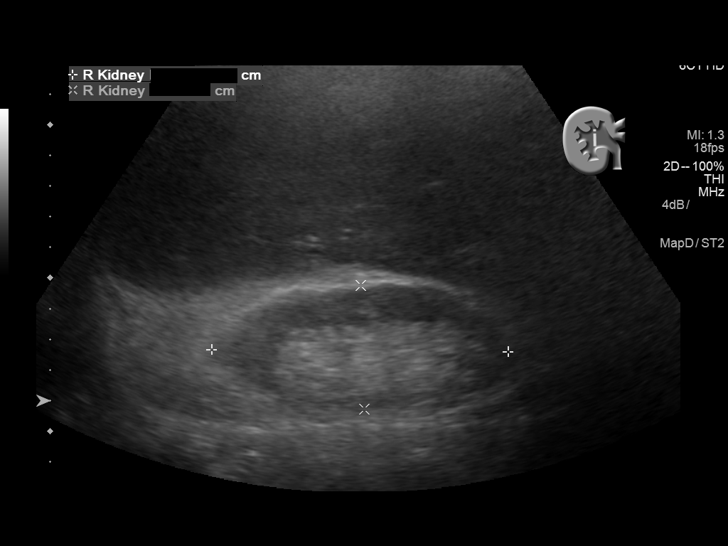
[im 6/31]
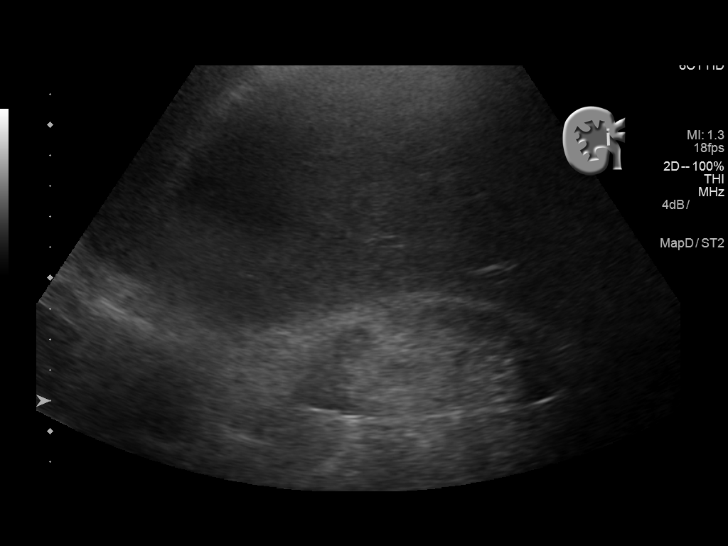
[im 8/31]
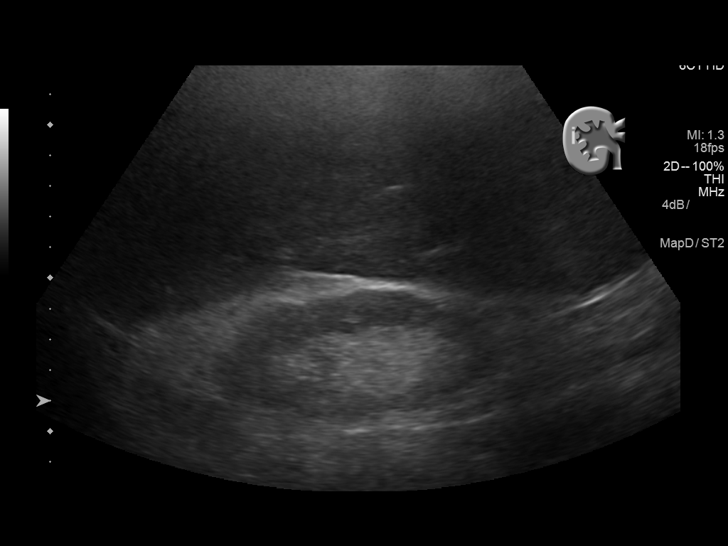
[im 11/31]
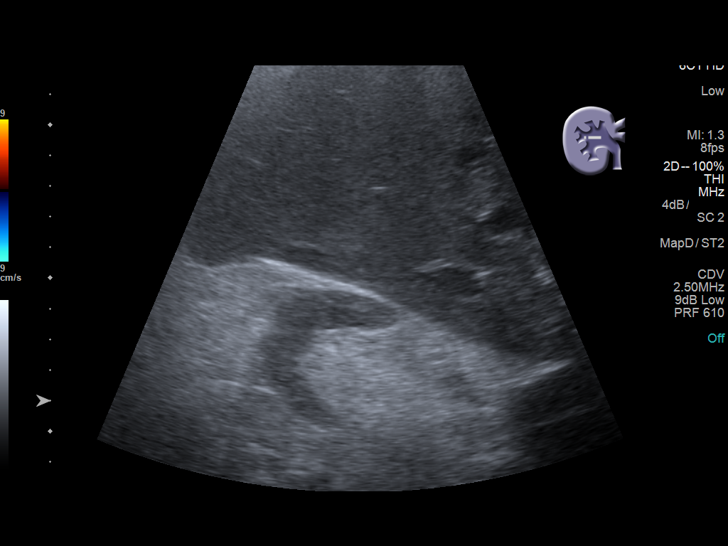
[im 12/31]
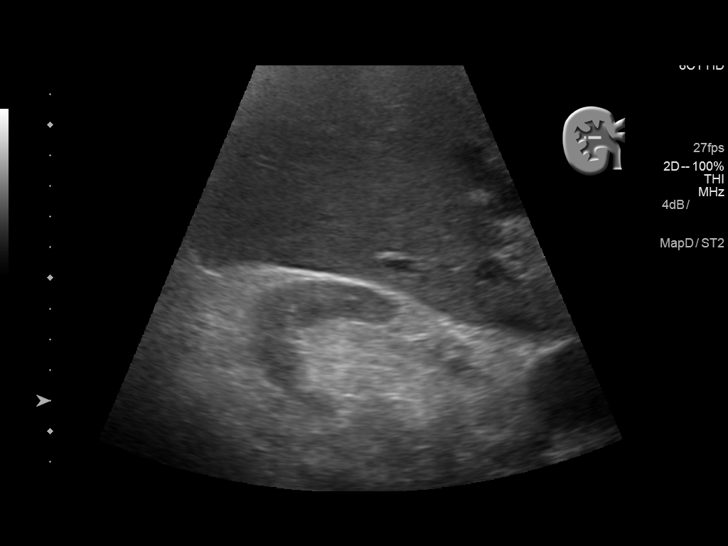
[im 14/31]
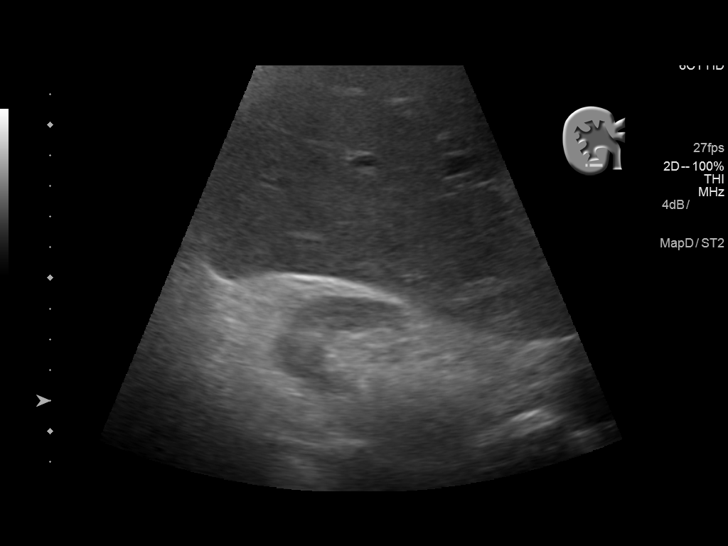
[im 17/31]
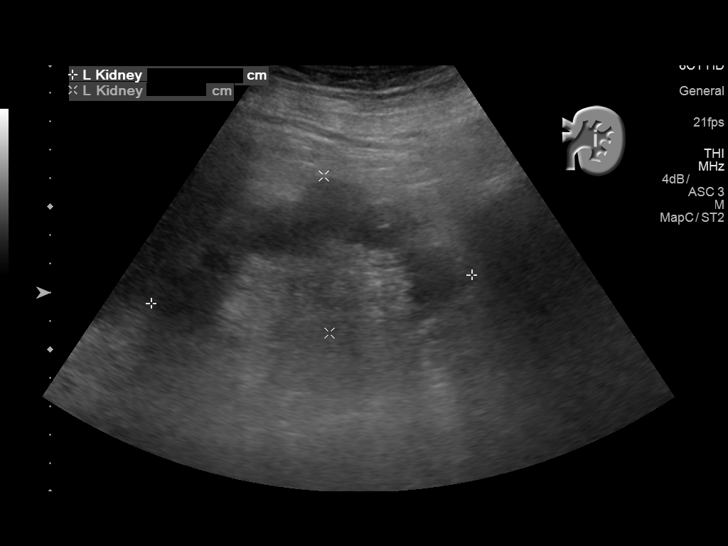
[im 19/31]
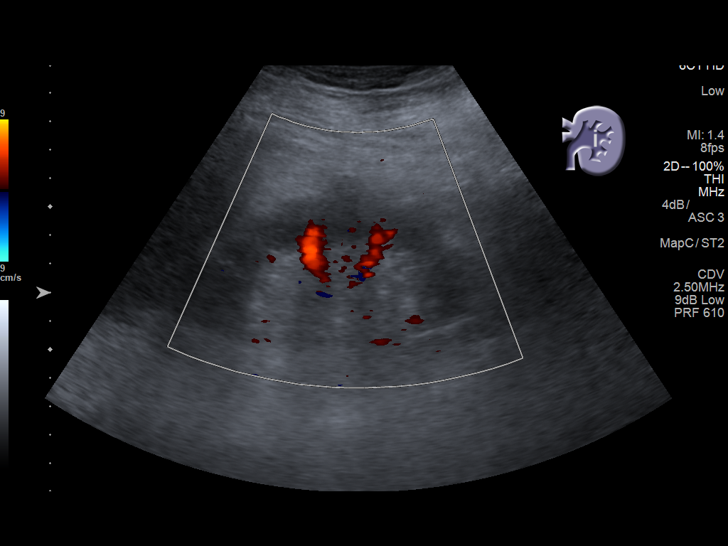
[im 21/31]
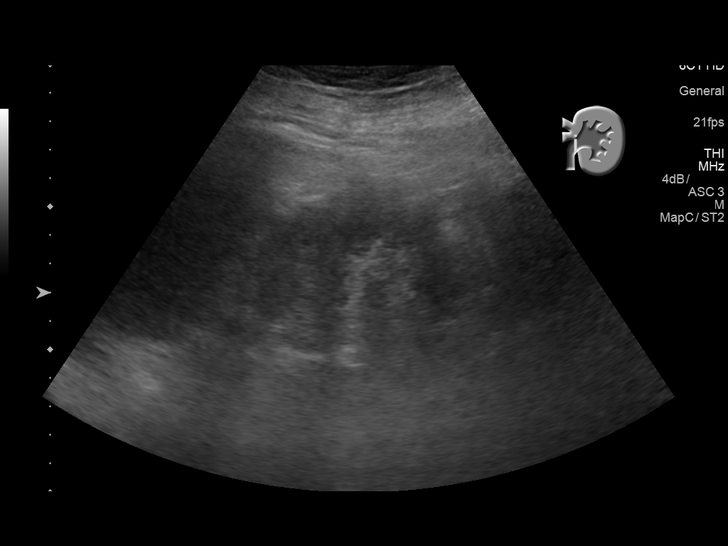
[im 23/31]
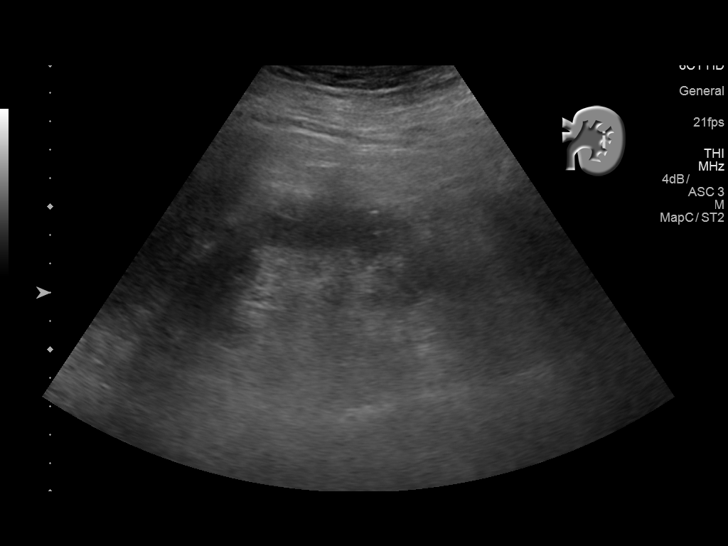
[im 26/31]
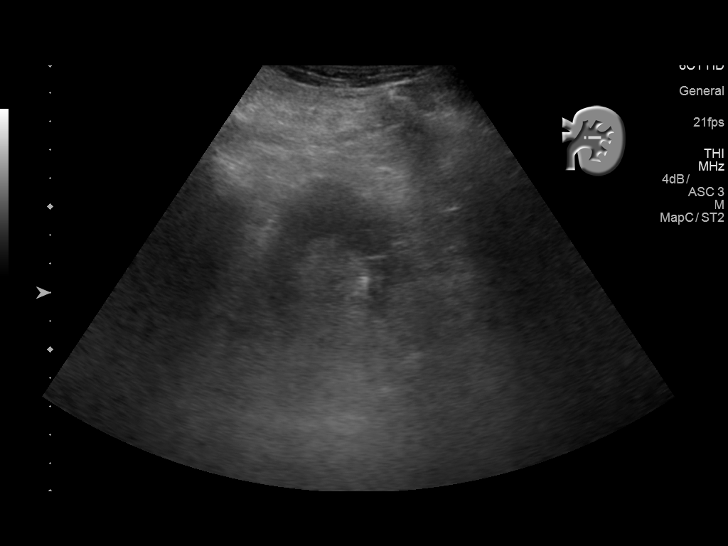
[im 28/31]
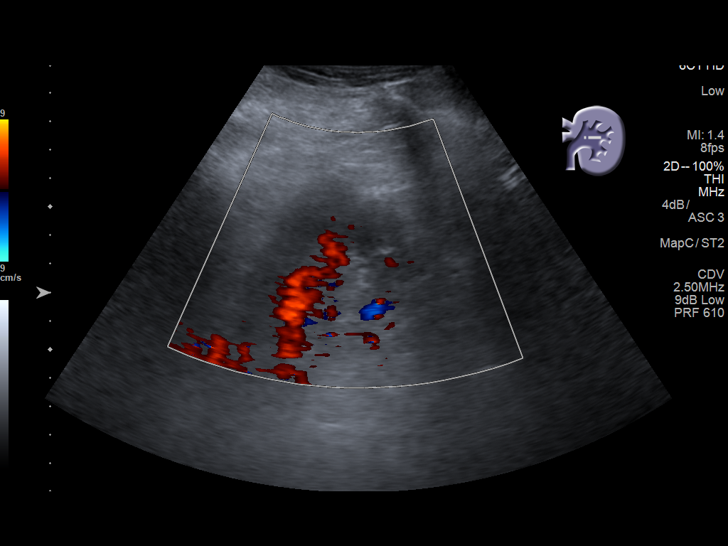
[im 31/31]
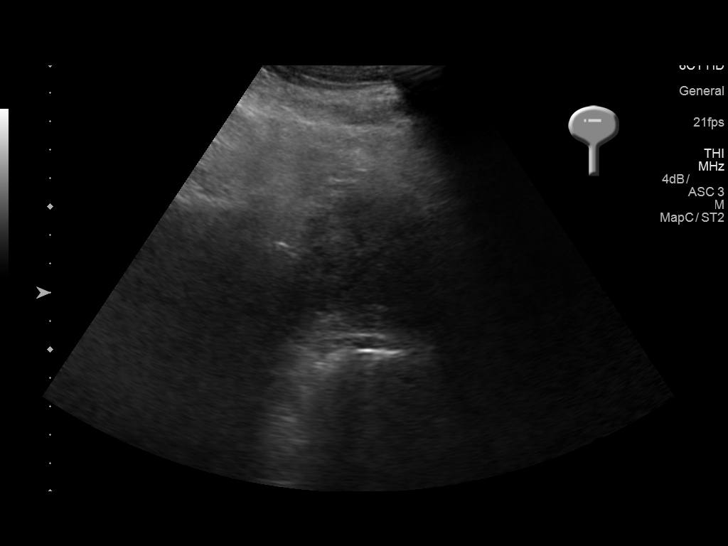

[14 of 25 positions shown; findings below may reference images not displayed]

FINDINGS: Right Kidney:

Renal measurements: 9.7 x 4.0 x 4.7 cm = volume: 94.8 mL. Minimal
increased echogenicity. No mass or hydronephrosis visualized.

Left Kidney:

Renal measurements: 11.3 x 5.5 x 5.5 cm = volume: 177.7 mL.
Echogenicity within normal limits. Lobular contour. No
hydronephrosis or mass noted.

Bladder:

Decompressed and not evaluated.
IMPRESSION: 1. No hydronephrosis.
2. Minimal increased echogenicity right renal parenchyma.
3. Lobulated contour left kidney without mass identified.
4. Decompressed urinary bladder and therefore not evaluated.

## 2019-10-05 ENCOUNTER — Other Ambulatory Visit: Payer: Self-pay

## 2019-10-05 NOTE — Patient Outreach (Signed)
Red Emmi: Date of call 10/04/2019 Reason for alert:  Scheduled follow up- no  Placed call to patient with no answer. Left a message with my contact information and requested a call back.   PLAN: will mail outreach letter and attempt recall in 3 days.  Rowe Pavy, RN, BSN, CEN [facility] [employer] [facility] [phone]

## 2019-10-05 NOTE — Patient Outreach (Signed)
Red Emmi:  Incoming call from [employer], grandson. I explained reason for call and grandson states that no appointment made yet but he will call and schedule follow up with primary MD.  No other needs identified.  PLAN: case closure.  Rowe Pavy, RN, BSN, CEN [facility] [employer] [phone]

## 2019-10-08 ENCOUNTER — Ambulatory Visit: Payer: Medicare Other

## 2019-10-19 ENCOUNTER — Telehealth: Payer: Self-pay

## 2019-10-19 NOTE — Telephone Encounter (Signed)
Spoke to [place] concerning some papers we received in the mail on patient. I informed her that we were not his PCP when the papers were signed their facility on March 17. Also, it appears that Dr Einar Crow is the MD that was involved in his care on that date. She asked that I fax the papers back to her at [phone] and I did

## 2019-12-01 ENCOUNTER — Ambulatory Visit: Payer: Medicare Other | Admitting: Family Medicine

## 2019-12-07 ENCOUNTER — Emergency Department: Payer: Medicare Other

## 2019-12-07 ENCOUNTER — Other Ambulatory Visit: Payer: Self-pay

## 2019-12-07 ENCOUNTER — Inpatient Hospital Stay
Admission: EM | Admit: 2019-12-07 | Discharge: 2019-12-14 | DRG: 291 | Disposition: A | Discharge status: Skilled Nursing Facility | Payer: Medicare Other | Attending: Internal Medicine | Admitting: Internal Medicine

## 2019-12-07 ENCOUNTER — Encounter: Payer: Self-pay | Admitting: Emergency Medicine

## 2019-12-07 DIAGNOSIS — N179 Acute kidney failure, unspecified: Secondary | ICD-10-CM | POA: Diagnosis not present

## 2019-12-07 DIAGNOSIS — Z87891 Personal history of nicotine dependence: Secondary | ICD-10-CM

## 2019-12-07 DIAGNOSIS — N1831 Chronic kidney disease, stage 3a: Secondary | ICD-10-CM

## 2019-12-07 DIAGNOSIS — Z800 Family history of malignant neoplasm of digestive organs: Secondary | ICD-10-CM | POA: Diagnosis not present

## 2019-12-07 DIAGNOSIS — Z8673 Personal history of transient ischemic attack (TIA), and cerebral infarction without residual deficits: Secondary | ICD-10-CM | POA: Diagnosis not present

## 2019-12-07 DIAGNOSIS — E785 Hyperlipidemia, unspecified: Secondary | ICD-10-CM | POA: Diagnosis not present

## 2019-12-07 DIAGNOSIS — Z888 Allergy status to other drugs, medicaments and biological substances status: Secondary | ICD-10-CM

## 2019-12-07 DIAGNOSIS — I509 Heart failure, unspecified: Secondary | ICD-10-CM | POA: Diagnosis not present

## 2019-12-07 DIAGNOSIS — G459 Transient cerebral ischemic attack, unspecified: Secondary | ICD-10-CM | POA: Diagnosis not present

## 2019-12-07 DIAGNOSIS — R531 Weakness: Secondary | ICD-10-CM

## 2019-12-07 DIAGNOSIS — Z66 Do not resuscitate: Secondary | ICD-10-CM | POA: Diagnosis not present

## 2019-12-07 DIAGNOSIS — Z7189 Other specified counseling: Secondary | ICD-10-CM | POA: Diagnosis not present

## 2019-12-07 DIAGNOSIS — J90 Pleural effusion, not elsewhere classified: Secondary | ICD-10-CM | POA: Diagnosis not present

## 2019-12-07 DIAGNOSIS — M6281 Muscle weakness (generalized): Secondary | ICD-10-CM | POA: Diagnosis not present

## 2019-12-07 DIAGNOSIS — Z8249 Family history of ischemic heart disease and other diseases of the circulatory system: Secondary | ICD-10-CM

## 2019-12-07 DIAGNOSIS — L89151 Pressure ulcer of sacral region, stage 1: Secondary | ICD-10-CM | POA: Diagnosis present

## 2019-12-07 DIAGNOSIS — R5381 Other malaise: Secondary | ICD-10-CM | POA: Diagnosis not present

## 2019-12-07 DIAGNOSIS — I4891 Unspecified atrial fibrillation: Secondary | ICD-10-CM

## 2019-12-07 DIAGNOSIS — Z7982 Long term (current) use of aspirin: Secondary | ICD-10-CM

## 2019-12-07 DIAGNOSIS — I5023 Acute on chronic systolic (congestive) heart failure: Secondary | ICD-10-CM | POA: Diagnosis not present

## 2019-12-07 DIAGNOSIS — R778 Other specified abnormalities of plasma proteins: Secondary | ICD-10-CM | POA: Diagnosis not present

## 2019-12-07 DIAGNOSIS — Z515 Encounter for palliative care: Secondary | ICD-10-CM

## 2019-12-07 DIAGNOSIS — I2510 Atherosclerotic heart disease of native coronary artery without angina pectoris: Secondary | ICD-10-CM | POA: Diagnosis not present

## 2019-12-07 DIAGNOSIS — G4700 Insomnia, unspecified: Secondary | ICD-10-CM | POA: Diagnosis present

## 2019-12-07 DIAGNOSIS — Z8042 Family history of malignant neoplasm of prostate: Secondary | ICD-10-CM

## 2019-12-07 DIAGNOSIS — U071 COVID-19: Secondary | ICD-10-CM | POA: Diagnosis not present

## 2019-12-07 DIAGNOSIS — I130 Hypertensive heart and chronic kidney disease with heart failure and stage 1 through stage 4 chronic kidney disease, or unspecified chronic kidney disease: Secondary | ICD-10-CM | POA: Diagnosis not present

## 2019-12-07 DIAGNOSIS — R05 Cough: Secondary | ICD-10-CM | POA: Diagnosis not present

## 2019-12-07 DIAGNOSIS — R2681 Unsteadiness on feet: Secondary | ICD-10-CM | POA: Diagnosis not present

## 2019-12-07 DIAGNOSIS — R7989 Other specified abnormal findings of blood chemistry: Secondary | ICD-10-CM | POA: Diagnosis present

## 2019-12-07 DIAGNOSIS — I248 Other forms of acute ischemic heart disease: Secondary | ICD-10-CM | POA: Diagnosis not present

## 2019-12-07 DIAGNOSIS — Z79899 Other long term (current) drug therapy: Secondary | ICD-10-CM

## 2019-12-07 DIAGNOSIS — R519 Headache, unspecified: Secondary | ICD-10-CM | POA: Diagnosis not present

## 2019-12-07 DIAGNOSIS — I10 Essential (primary) hypertension: Secondary | ICD-10-CM | POA: Diagnosis present

## 2019-12-07 DIAGNOSIS — Z743 Need for continuous supervision: Secondary | ICD-10-CM | POA: Diagnosis not present

## 2019-12-07 DIAGNOSIS — I480 Paroxysmal atrial fibrillation: Secondary | ICD-10-CM | POA: Diagnosis present

## 2019-12-07 DIAGNOSIS — M109 Gout, unspecified: Secondary | ICD-10-CM | POA: Diagnosis present

## 2019-12-07 DIAGNOSIS — M2550 Pain in unspecified joint: Secondary | ICD-10-CM | POA: Diagnosis not present

## 2019-12-07 DIAGNOSIS — N183 Chronic kidney disease, stage 3 unspecified: Secondary | ICD-10-CM | POA: Diagnosis not present

## 2019-12-07 DIAGNOSIS — Z79891 Long term (current) use of opiate analgesic: Secondary | ICD-10-CM

## 2019-12-07 DIAGNOSIS — J1282 Pneumonia due to coronavirus disease 2019: Secondary | ICD-10-CM | POA: Diagnosis not present

## 2019-12-07 DIAGNOSIS — Z7401 Bed confinement status: Secondary | ICD-10-CM | POA: Diagnosis not present

## 2019-12-07 DIAGNOSIS — J811 Chronic pulmonary edema: Secondary | ICD-10-CM | POA: Diagnosis not present

## 2019-12-07 DIAGNOSIS — R2689 Other abnormalities of gait and mobility: Secondary | ICD-10-CM | POA: Diagnosis not present

## 2019-12-07 LAB — CBC WITH DIFFERENTIAL/PLATELET
Abs Immature Granulocytes: 0.02 10*3/uL (ref 0.00–0.07)
Basophils Absolute: 0 10*3/uL (ref 0.0–0.1)
Basophils Relative: 0 %
Eosinophils Absolute: 0 10*3/uL (ref 0.0–0.5)
Eosinophils Relative: 0 %
HCT: 40.4 % (ref 39.0–52.0)
Hemoglobin: 12.5 g/dL — ABNORMAL LOW (ref 13.0–17.0)
Immature Granulocytes: 0 %
Lymphocytes Relative: 25 %
Lymphs Abs: 1.5 10*3/uL (ref 0.7–4.0)
MCH: 29.1 pg (ref 26.0–34.0)
MCHC: 30.9 g/dL (ref 30.0–36.0)
MCV: 94.2 fL (ref 80.0–100.0)
Monocytes Absolute: 0.5 10*3/uL (ref 0.1–1.0)
Monocytes Relative: 8 %
Neutro Abs: 3.9 10*3/uL (ref 1.7–7.7)
Neutrophils Relative %: 67 %
Platelets: 144 10*3/uL — ABNORMAL LOW (ref 150–400)
RBC: 4.29 MIL/uL (ref 4.22–5.81)
RDW: 16.5 % — ABNORMAL HIGH (ref 11.5–15.5)
WBC: 5.9 10*3/uL (ref 4.0–10.5)
nRBC: 0 % (ref 0.0–0.2)

## 2019-12-07 LAB — URINALYSIS, COMPLETE (UACMP) WITH MICROSCOPIC
Bacteria, UA: NONE SEEN
Bilirubin Urine: NEGATIVE
Glucose, UA: NEGATIVE mg/dL
Ketones, ur: NEGATIVE mg/dL
Leukocytes,Ua: NEGATIVE
Nitrite: NEGATIVE
Protein, ur: 30 mg/dL — AB
Specific Gravity, Urine: 1.011 (ref 1.005–1.030)
Squamous Epithelial / HPF: NONE SEEN (ref 0–5)
pH: 5 (ref 5.0–8.0)

## 2019-12-07 LAB — FIBRINOGEN: Fibrinogen: 391 mg/dL (ref 210–475)

## 2019-12-07 LAB — BRAIN NATRIURETIC PEPTIDE: B Natriuretic Peptide: 2150.1 pg/mL — ABNORMAL HIGH (ref 0.0–100.0)

## 2019-12-07 LAB — COMPREHENSIVE METABOLIC PANEL
ALT: 27 U/L (ref 0–44)
AST: 29 U/L (ref 15–41)
Albumin: 3.6 g/dL (ref 3.5–5.0)
Alkaline Phosphatase: 165 U/L — ABNORMAL HIGH (ref 38–126)
Anion gap: 10 (ref 5–15)
BUN: 33 mg/dL — ABNORMAL HIGH (ref 8–23)
CO2: 25 mmol/L (ref 22–32)
Calcium: 8.3 mg/dL — ABNORMAL LOW (ref 8.9–10.3)
Chloride: 109 mmol/L (ref 98–111)
Creatinine, Ser: 1.95 mg/dL — ABNORMAL HIGH (ref 0.61–1.24)
GFR calc Af Amer: 37 mL/min — ABNORMAL LOW (ref >60)
GFR calc non Af Amer: 32 mL/min — ABNORMAL LOW (ref >60)
Glucose, Bld: 127 mg/dL — ABNORMAL HIGH (ref 70–99)
Potassium: 3.5 mmol/L (ref 3.5–5.1)
Sodium: 144 mmol/L (ref 135–145)
Total Bilirubin: 1.7 mg/dL — ABNORMAL HIGH (ref 0.3–1.2)
Total Protein: 7.2 g/dL (ref 6.5–8.1)

## 2019-12-07 LAB — TRIGLYCERIDES: Triglycerides: 54 mg/dL (ref <150)

## 2019-12-07 LAB — FERRITIN: Ferritin: 133 ng/mL (ref 24–336)

## 2019-12-07 LAB — TROPONIN I (HIGH SENSITIVITY)
Troponin I (High Sensitivity): 26 ng/L — ABNORMAL HIGH (ref <18)
Troponin I (High Sensitivity): 25 ng/L — ABNORMAL HIGH (ref <18)
Troponin I (High Sensitivity): 25 ng/L — ABNORMAL HIGH (ref <18)

## 2019-12-07 LAB — C-REACTIVE PROTEIN: CRP: 3.2 mg/dL — ABNORMAL HIGH (ref <1.0)

## 2019-12-07 LAB — HEPATITIS B SURFACE ANTIGEN: Hepatitis B Surface Ag: NONREACTIVE

## 2019-12-07 LAB — LACTATE DEHYDROGENASE: LDH: 143 U/L (ref 98–192)

## 2019-12-07 LAB — FIBRIN DERIVATIVES D-DIMER (ARMC ONLY): Fibrin derivatives D-dimer (ARMC): 530.63 ng/mL (FEU) — ABNORMAL HIGH (ref 0.00–499.00)

## 2019-12-07 LAB — PROCALCITONIN: Procalcitonin: <0.1 ng/mL

## 2019-12-07 LAB — SARS CORONAVIRUS 2 BY RT PCR (HOSPITAL ORDER, PERFORMED IN ~~LOC~~ HOSPITAL LAB): SARS Coronavirus 2: POSITIVE — AB

## 2019-12-07 MED ORDER — HYDRALAZINE HCL 20 MG/ML IJ SOLN: 5.0000 mg | INTRAMUSCULAR | Status: DC | PRN

## 2019-12-07 MED ORDER — ALBUTEROL SULFATE HFA 108 (90 BASE) MCG/ACT IN AERS
2.0000 | INHALATION_SPRAY | RESPIRATORY_TRACT | Status: DC | PRN
  Filled 2019-12-07: qty 6.7

## 2019-12-07 MED ORDER — VITAMIN D 25 MCG (1000 UNIT) PO TABS
1000.0000 [IU] | ORAL_TABLET | Freq: Once | ORAL | Status: AC
  Administered 2019-12-07: 1000 [IU] via ORAL
  Filled 2019-12-07: qty 1

## 2019-12-07 MED ORDER — SODIUM CHLORIDE 0.9 % IV SOLN: 100.0000 mg | Freq: Every day | INTRAVENOUS | Status: DC

## 2019-12-07 MED ORDER — IVERMECTIN 3 MG PO TABS
200.0000 ug/kg | ORAL_TABLET | Freq: Once | ORAL | Status: AC
  Administered 2019-12-07: 18000 ug via ORAL
  Filled 2019-12-07: qty 6

## 2019-12-07 MED ORDER — VITAMIN D 25 MCG (1000 UNIT) PO TABS
1000.0000 [IU] | ORAL_TABLET | Freq: Two times a day (BID) | ORAL | Status: AC
  Administered 2019-12-07 – 2019-12-08 (×3): 1000 [IU] via ORAL
  Filled 2019-12-07 (×3): qty 1

## 2019-12-07 MED ORDER — SODIUM CHLORIDE 0.9 % IV SOLN
200.0000 mg | Freq: Once | INTRAVENOUS | Status: AC
  Administered 2019-12-07: 200 mg via INTRAVENOUS
  Filled 2019-12-07: qty 40

## 2019-12-07 MED ORDER — DM-GUAIFENESIN ER 30-600 MG PO TB12
1.0000 | ORAL_TABLET | Freq: Two times a day (BID) | ORAL | Status: DC | PRN
  Administered 2019-12-08 – 2019-12-14 (×3): 1 via ORAL
  Filled 2019-12-07 (×3): qty 1

## 2019-12-07 MED ORDER — FUROSEMIDE 10 MG/ML IJ SOLN
80.0000 mg | Freq: Once | INTRAMUSCULAR | Status: AC
  Administered 2019-12-07: 80 mg via INTRAVENOUS
  Filled 2019-12-07: qty 8

## 2019-12-07 MED ORDER — SODIUM CHLORIDE 0.9% FLUSH
3.0000 mL | INTRAVENOUS | Status: DC | PRN
  Administered 2019-12-08: 3 mL via INTRAVENOUS

## 2019-12-07 MED ORDER — SODIUM CHLORIDE 0.9% FLUSH
3.0000 mL | Freq: Two times a day (BID) | INTRAVENOUS | Status: DC
  Administered 2019-12-07 – 2019-12-14 (×12): 3 mL via INTRAVENOUS

## 2019-12-07 MED ORDER — ASPIRIN EC 81 MG PO TBEC
81.0000 mg | DELAYED_RELEASE_TABLET | Freq: Every day | ORAL | Status: DC
  Administered 2019-12-07 – 2019-12-14 (×8): 81 mg via ORAL
  Filled 2019-12-07 (×8): qty 1

## 2019-12-07 MED ORDER — IVERMECTIN 3 MG PO TABS
200.0000 ug/kg | ORAL_TABLET | Freq: Two times a day (BID) | ORAL | Status: DC
  Administered 2019-12-07 – 2019-12-08 (×2): 18000 ug via ORAL
  Filled 2019-12-07 (×4): qty 6

## 2019-12-07 MED ORDER — SODIUM CHLORIDE 0.9 % IV SOLN
100.0000 mg | Freq: Every day | INTRAVENOUS | Status: AC
  Administered 2019-12-08 – 2019-12-11 (×4): 100 mg via INTRAVENOUS
  Filled 2019-12-07 (×4): qty 100

## 2019-12-07 MED ORDER — FUROSEMIDE 10 MG/ML IJ SOLN
40.0000 mg | Freq: Two times a day (BID) | INTRAMUSCULAR | Status: DC
  Administered 2019-12-08 – 2019-12-13 (×10): 40 mg via INTRAVENOUS
  Filled 2019-12-07 (×10): qty 4

## 2019-12-07 MED ORDER — ZINC SULFATE 220 (50 ZN) MG PO CAPS
220.0000 mg | ORAL_CAPSULE | Freq: Every day | ORAL | Status: DC
  Administered 2019-12-07 – 2019-12-14 (×8): 220 mg via ORAL
  Filled 2019-12-07 (×8): qty 1

## 2019-12-07 MED ORDER — IPRATROPIUM BROMIDE HFA 17 MCG/ACT IN AERS
2.0000 | INHALATION_SPRAY | RESPIRATORY_TRACT | Status: DC
  Administered 2019-12-07 – 2019-12-14 (×32): 2 via RESPIRATORY_TRACT
  Filled 2019-12-07: qty 12.9

## 2019-12-07 MED ORDER — IPRATROPIUM-ALBUTEROL 0.5-2.5 (3) MG/3ML IN SOLN
3.0000 mL | Freq: Once | RESPIRATORY_TRACT | Status: AC
  Administered 2019-12-07: 3 mL via RESPIRATORY_TRACT
  Filled 2019-12-07: qty 6

## 2019-12-07 MED ORDER — ENOXAPARIN SODIUM 40 MG/0.4ML ~~LOC~~ SOLN
40.0000 mg | SUBCUTANEOUS | Status: DC
  Administered 2019-12-07 – 2019-12-13 (×7): 40 mg via SUBCUTANEOUS
  Filled 2019-12-07 (×6): qty 0.4

## 2019-12-07 MED ORDER — ACETAMINOPHEN 325 MG PO TABS
650.0000 mg | ORAL_TABLET | Freq: Four times a day (QID) | ORAL | Status: DC | PRN
  Administered 2019-12-14: 09:00:00 650 mg via ORAL
  Filled 2019-12-07: qty 2

## 2019-12-07 MED ORDER — ASCORBIC ACID 500 MG PO TABS
500.0000 mg | ORAL_TABLET | Freq: Every day | ORAL | Status: DC
  Administered 2019-12-07 – 2019-12-14 (×8): 500 mg via ORAL
  Filled 2019-12-07 (×8): qty 1

## 2019-12-07 MED ORDER — SODIUM CHLORIDE 0.9 % IV SOLN
250.0000 mL | INTRAVENOUS | Status: DC | PRN
  Administered 2019-12-08: 250 mL via INTRAVENOUS

## 2019-12-07 NOTE — Consult Note (Signed)
Remdesivir - Pharmacy Brief Note     A/P:  Remdesivir 200 mg IVPB once followed by 100 mg IVPB daily x 4 days.   Reviewed pt history without finding previous treatment with remdesivir.  Albina Billet, PharmD, BCPS Clinical Pharmacist 12/07/2019 1:25 PM

## 2019-12-07 NOTE — ED Notes (Signed)
Resumed care from [facility]  Pt alert, watching tv.  Iv in place.  Pt has a cough   Family at bedside.  No chest pain and denies sob.  afib on monitor.

## 2019-12-07 NOTE — ED Provider Notes (Signed)
ER Provider Note       Time seen: 8:31 AM    I have reviewed the vital signs and the nursing notes.  HISTORY   Chief Complaint Weakness    HPI Larry Lawrence is a 79 y.o. male with a history of allergies, CHF, coronary disease, gout, insomnia, renal disorder, TIA who presents today for increased weakness from baseline and not eating for the past several days.  Patient has also had a cough, no fever.  Patient denies any pain on arrival.  Past Medical History:  Diagnosis Date  . Allergy   . CHF (congestive heart failure) (HCC)   . Coronary artery disease   . Gout   . Insomnia   . Renal disorder   . Sleeps in sitting position due to orthopnea   . TIA (transient ischemic attack)     Past Surgical History:  Procedure Laterality Date  . CARDIAC CATHETERIZATION N/A 02/02/2016   Procedure: Left Heart Cath and Coronary Angiography;  Surgeon: Lamar Blinks, MD;  Location: [facility] INVASIVE CV LAB;  Service: Cardiovascular;  Laterality: N/A;  . CARDIAC SURGERY     3 stents in "main artery"  . JOINT REPLACEMENT      Allergies Niaspan [niacin er]   Review of Systems Constitutional: Negative for fever.  Positive for decreased appetite Cardiovascular: Negative for chest pain. Respiratory: Negative for shortness of breath.  Positive for cough Gastrointestinal: Negative for abdominal pain, vomiting and diarrhea. Musculoskeletal: Negative for back pain. Skin: Negative for rash. Neurological: Positive for weakness  All systems negative/normal/unremarkable except as stated in the HPI  ____________________________________________   PHYSICAL EXAM:  VITAL SIGNS: Vitals:   12/07/19 0814  BP: (!) 133/96  Pulse: 93  Resp: 18  Temp: 98.2 F (36.8 C)  SpO2: 94%    Constitutional: Alert and oriented. Well appearing and in no distress. Eyes: Conjunctivae are normal. Normal extraocular movements. ENT      Head: Normocephalic and atraumatic.      Nose: No  congestion/rhinnorhea.      Mouth/Throat: Mucous membranes are moist.      Neck: No stridor. Cardiovascular: Normal rate, regular rhythm. No murmurs, rubs, or gallops. Respiratory: Mild wheezing and rhonchi bilaterally Gastrointestinal: Soft and nontender. Normal bowel sounds Musculoskeletal: Nontender with normal range of motion in extremities.  Chronic lower extremity edema is noted. Neurologic:  Normal speech and language. No gross focal neurologic deficits are appreciated.  Skin:  Skin is warm, dry and intact. No rash noted. Psychiatric: Speech and behavior are normal.  ____________________________________________  EKG: Interpreted by me.  Atrial fibrillation with a rate of 95 bpm, old anterior infarct, long QT  ____________________________________________   LABS (pertinent positives/negatives)  Labs Reviewed  SARS CORONAVIRUS 2 BY RT PCR (HOSPITAL ORDER, PERFORMED IN [facility]) - Abnormal; Notable for the following components:      Result Value   SARS Coronavirus 2 POSITIVE (*)    All other components within normal limits  CBC WITH DIFFERENTIAL/PLATELET - Abnormal; Notable for the following components:   Hemoglobin 12.5 (*)    RDW 16.5 (*)    Platelets 144 (*)    All other components within normal limits  COMPREHENSIVE METABOLIC PANEL - Abnormal; Notable for the following components:   Glucose, Bld 127 (*)    BUN 33 (*)    Creatinine, Ser 1.95 (*)    Calcium 8.3 (*)    Alkaline Phosphatase 165 (*)    Total Bilirubin 1.7 (*)  GFR calc non Af Amer 32 (*)    GFR calc Af Amer 37 (*)    All other components within normal limits  BRAIN NATRIURETIC PEPTIDE - Abnormal; Notable for the following components:   B Natriuretic Peptide 2,150.1 (*)    All other components within normal limits  TROPONIN I (HIGH SENSITIVITY) - Abnormal; Notable for the following components:   Troponin I (High Sensitivity) 26 (*)    All other components within normal limits   URINALYSIS, COMPLETE (UACMP) WITH MICROSCOPIC    RADIOLOGY  Images were viewed by me Chest x-ray IMPRESSION: Small right pleural effusion with pulmonary vascular congestion. Suspect mild CHF.  DIFFERENTIAL DIAGNOSIS  Dehydration, electrolyte abnormality, occult infection, pneumonia, CHF  ASSESSMENT AND PLAN  COVID-19, acute on chronic CHF   Plan: The patient had presented for weakness and cough. Patient's labs did indicate acute on chronic congestive heart failure were overall were not significantly changed.  Labs also indicated COVID-19 which is likely what is causing his fatigue.  Patient is received additional Lasix here for his congestive heart failure and we have started ivermectin.  I will discuss with the hospitalist for admission.  Lenise Arena MD    Note: This note was generated in part or whole with voice recognition software. Voice recognition is usually quite accurate but there are transcription errors that can and very often do occur. I apologize for any typographical errors that were not detected and corrected.     Earleen Newport, MD 12/07/19 1101

## 2019-12-07 NOTE — H&P (Signed)
History and Physical    Larry Lawrence OHY:[id number] DOB: [birth year] DOA: 12/07/2019  Referring MD/NP/PA:   PCP: [name], MD   Patient coming from:  The patient is coming from home.  At baseline, pt is partially dependent for most of ADL.        Chief Complaint: Shortness of breath and generalized weakness  HPI: Larry Lawrence is a 79 y.o. male with medical history significant of hypertension, hyperlipidemia, TIA, gout, CHF with EF 20--25%, CAD, CKD stage III, atrial fibrillation not on anticoagulant, V. tach, gout, who presents with shortness of breath and generalized weakness.  Patient states that he has been having cough and shortness of breath in the past several days which has been progressively worsening.  Denies chest pain, fever or chills.  Patient has bilateral leg edema.  Patient has generalized weakness, but no unilateral numbness or tingling to extremities. No facial droop or slurred speech.  Patient has decreased appetite and oral intake.No nausea, vomiting, diarrhea, abdominal pain, symptoms of UTI.  Patient did not get Covid vaccin.  ED Course: pt was found to have BNP 2150, troponin 26, positive COVID-19 PCR, stable renal function, temperature normal, blood pressure 132/96, tachycardia, tachypnea, oxygen saturation 94-100% on room air.  Chest x-ray showed vascular congestion and small right pleural effusion.  CT head is negative for acute intracranial abnormalities.  Patient is admitted to progressive bed as inpatient.  Review of Systems:   General: no fevers, chills, has poor appetite, has fatigue HEENT: no blurry vision, hearing changes or sore throat Respiratory: has dyspnea, coughing, no wheezing CV: no chest pain, no palpitations GI: no nausea, vomiting, abdominal pain, diarrhea, constipation GU: no dysuria, burning on urination, increased urinary frequency, hematuria  Ext: has leg edema Neuro: no unilateral weakness, numbness, or tingling, no vision  change or hearing loss Skin: no rash, no skin tear. MSK: No muscle spasm, no deformity, no limitation of range of movement in spin Heme: No easy bruising.  Travel history: No recent long distant travel.  Allergy:  Allergies  Allergen Reactions  . Niaspan [Niacin Er] Other (See Comments)    Reaction:  Hot flashes     Past Medical History:  Diagnosis Date  . Allergy   . CHF (congestive heart failure) (HCC)   . Coronary artery disease   . Gout   . Insomnia   . Renal disorder   . Sleeps in sitting position due to orthopnea   . TIA (transient ischemic attack)     Past Surgical History:  Procedure Laterality Date  . CARDIAC CATHETERIZATION N/A 02/02/2016   Procedure: Left Heart Cath and Coronary Angiography;  Surgeon: Lamar Blinks, MD;  Location: [facility] INVASIVE CV LAB;  Service: Cardiovascular;  Laterality: N/A;  . CARDIAC SURGERY     3 stents in "main artery"  . JOINT REPLACEMENT      Social History:  reports that he quit smoking about 61 years ago. His smoking use included cigarettes. He has a 6.00 pack-year smoking history. He has never used smokeless tobacco. He reports that he does not drink alcohol or use drugs.  Family History:  Family History  Problem Relation Age of Onset  . Hypertension Mother   . Pancreatic cancer Mother   . Hypertension Father   . Prostate cancer Father      Prior to Admission medications   Medication Sig Start Date End Date Taking? Authorizing Provider  allopurinol (ZYLOPRIM) 300 MG tablet TAKE (1) TABLET BY MOUTH a  day 06/09/19  Yes Duanne Limerick, MD  amiodarone (PACERONE) 200 MG tablet Take 200 mg by mouth daily. Dr Suzzanne Cloud, MD  aspirin 81 MG EC tablet Take by mouth.   Yes [provider]  atorvastatin (LIPITOR) 20 MG tablet Take 20 mg by mouth daily.  01/05/18 12/07/19 Yes [provider]  carvedilol (COREG) 12.5 MG tablet Take 2 tablets (25 mg total) by mouth 2 (two) times daily. Dr Gwen Pounds  09/13/19 12/07/19 Yes Enedina Finner, MD  isosorbide mononitrate (IMDUR) 30 MG 24 hr tablet Take 30 mg by mouth daily. [email or web address]   Yes [provider]  loratadine (CLARITIN) 10 MG tablet Take by mouth. 01/04/16  Yes [provider]  metolazone (ZAROXOLYN) 5 MG tablet Take by mouth. 01/29/19 01/29/20 Yes [provider]  montelukast (SINGULAIR) 10 MG tablet TAKE (1) TABLET BY MOUTH DAILY AT BEDTIME 06/09/19  Yes Duanne Limerick, MD  potassium chloride SA (K-DUR,KLOR-CON) 20 MEQ tablet Take 1 tablet (20 mEq total) by mouth 2 (two) times daily. Patient taking differently: Take 20 mEq by mouth 2 (two) times daily. Dr Gwen Pounds 07/22/18  Yes Enid Baas, Jude, MD  furosemide (LASIX) 20 MG tablet Take by mouth.    [provider]  hydrALAZINE (APRESOLINE) 25 MG tablet Take 1 tablet (25 mg total) by mouth every 8 (eight) hours. Dr Gwen Pounds Patient not taking: Reported on 12/07/2019 09/13/19   Enedina Finner, MD  oxyCODONE (OXY IR/ROXICODONE) 5 MG immediate release tablet Take 1 tablet (5 mg total) by mouth every 6 (six) hours as needed for severe pain. Patient not taking: Reported on 12/07/2019 09/13/19   Enedina Finner, MD  timolol (TIMOPTIC) 0.5 % ophthalmic solution Place 1 drop into both eyes 2 (two) times daily. Dr Inez Pilgrim    [provider]  torsemide (DEMADEX) 20 MG tablet Take 40 mg by mouth daily. Dr Tamera Punt 05/24/19   [provider]    Physical Exam: Vitals:   12/07/19 0915 12/07/19 0930 12/07/19 0945 12/07/19 1000  BP:  (!) 135/96  (!) 132/97  Pulse: 98 87 99 80  Resp: (!) 28 (!) 23 (!) 23 (!) 21  Temp:      TempSrc:      SpO2: 97% 96% 97% 100%  Weight:      Height:       General: Not in acute distress HEENT:       Eyes: PERRL, EOMI, no scleral icterus.       ENT: No discharge from the ears and nose, no pharynx injection, no tonsillar enlargement.        Neck: postive JVD, no bruit, no mass felt. Heme: No neck lymph node enlargement. Cardiac: S1/S2, RRR,  No murmurs, No gallops or rubs. Respiratory: Has fine crackles bilaterally GI: Soft, nondistended, nontender, no rebound pain, no organomegaly, BS present. GU: No hematuria Ext: 1+ pitting leg edema bilaterally. 2+DP/PT pulse bilaterally. Musculoskeletal: No joint deformities, No joint redness or warmth, no limitation of ROM in spin. Skin: No rashes.  Neuro: Alert, oriented X3, cranial nerves II-XII grossly intact, moves all extremities normally.  Psych: Patient is not psychotic, no suicidal or hemocidal ideation.  Labs on Admission: I have personally reviewed following labs and imaging studies  CBC: Recent Labs  Lab 12/07/19 0833  WBC 5.9  NEUTROABS 3.9  HGB 12.5*  HCT 40.4  MCV 94.2  PLT 144*   Basic Metabolic Panel: Recent Labs  Lab 12/07/19 0833  NA 144  K  3.5  CL 109  CO2 25  GLUCOSE 127*  BUN 33*  CREATININE 1.95*  CALCIUM 8.3*   GFR: Estimated Creatinine Clearance: 37 mL/min (A) (by C-G formula based on SCr of 1.95 mg/dL (H)). Liver Function Tests: Recent Labs  Lab 12/07/19 0833  AST 29  ALT 27  ALKPHOS 165*  BILITOT 1.7*  PROT 7.2  ALBUMIN 3.6   No results for input(s): LIPASE, AMYLASE in the last 168 hours. No results for input(s): AMMONIA in the last 168 hours. Coagulation Profile: No results for input(s): INR, PROTIME in the last 168 hours. Cardiac Enzymes: No results for input(s): CKTOTAL, CKMB, CKMBINDEX, TROPONINI in the last 168 hours. BNP (last 3 results) No results for input(s): PROBNP in the last 8760 hours. HbA1C: No results for input(s): HGBA1C in the last 72 hours. CBG: No results for input(s): GLUCAP in the last 168 hours. Lipid Profile: No results for input(s): CHOL, HDL, LDLCALC, TRIG, CHOLHDL, LDLDIRECT in the last 72 hours. Thyroid Function Tests: No results for input(s): TSH, T4TOTAL, FREET4, T3FREE, THYROIDAB in the last 72 hours. Anemia Panel: No results for input(s): VITAMINB12, FOLATE, FERRITIN, TIBC, IRON, RETICCTPCT in  the last 72 hours. Urine analysis:    Component Value Date/Time   COLORURINE YELLOW (A) 09/09/2019 0535   APPEARANCEUR CLEAR (A) 09/09/2019 0535   LABSPEC 1.013 09/09/2019 0535   PHURINE 5.0 09/09/2019 0535   GLUCOSEU NEGATIVE 09/09/2019 0535   HGBUR NEGATIVE 09/09/2019 0535   BILIRUBINUR NEGATIVE 09/09/2019 0535   KETONESUR NEGATIVE 09/09/2019 0535   PROTEINUR NEGATIVE 09/09/2019 0535   NITRITE NEGATIVE 09/09/2019 0535   LEUKOCYTESUR NEGATIVE 09/09/2019 0535   Sepsis Labs: @LABRCNTIP (procalcitonin:4,lacticidven:4) ) Recent Results (from the past 240 hour(s))  SARS Coronavirus 2 by RT PCR (hospital order, performed in [facility] hospital lab) Nasopharyngeal Nasopharyngeal Swab     Status: Abnormal   Collection Time: 12/07/19  9:18 AM   Specimen: Nasopharyngeal Swab  Result Value Ref Range Status   SARS Coronavirus 2 POSITIVE (A) NEGATIVE Final    Comment: RESULT CALLED TO, READ BACK BY AND VERIFIED WITH: JANE RYAN ON 12/07/19 AT 1021 QSD (NOTE) SARS-CoV-2 target nucleic acids are DETECTED SARS-CoV-2 RNA is generally detectable in upper respiratory specimens  during the acute phase of infection.  Positive results are indicative  of the presence of the identified virus, but do not rule out bacterial infection or co-infection with other pathogens not detected by the test.  Clinical correlation with patient history and  other diagnostic information is necessary to determine patient infection status.  The expected result is negative. Fact Sheet for Patients:   StrictlyIdeas.no  Fact Sheet for Healthcare Providers:   BankingDealers.co.za   This test is not yet approved or cleared by the [place] FDA and  has been authorized for detection and/or diagnosis of SARS-CoV-2 by FDA under an Emergency Use Authorization (EUA).  This EUA will remain in effect (meaning this test can be u sed) for the duration of  the COVID-19 declaration  under Section 564(b)(1) of the Act, 21 U.S.C. section 360-bbb-3(b)(1), unless the authorization is terminated or revoked sooner. Performed at [facility], [address] [place], [place] [address]      Radiological Exams on Admission: DG Chest 1 View  Result Date: 12/07/2019 CLINICAL DATA:  Weakness.  Cough EXAM: CHEST  1 VIEW COMPARISON:  07/19/2018 FINDINGS: Stable mild cardiac enlargement. Aortic atherosclerosis. Small right pleural effusion with pulmonary vascular congestion. No airspace consolidation. IMPRESSION: Small right pleural effusion with  pulmonary vascular congestion. Suspect mild CHF. Electronically Signed   By: Signa Kell M.D.   On: 12/07/2019 09:09   CT Head Wo Contrast  Result Date: 12/07/2019 CLINICAL DATA:  Increased weakness, cough EXAM: CT HEAD WITHOUT CONTRAST TECHNIQUE: Contiguous axial images were obtained from the base of the skull through the vertex without intravenous contrast. COMPARISON:  09/09/2019 FINDINGS: Brain: There is atrophy and chronic small vessel disease changes. No acute intracranial abnormality. Specifically, no hemorrhage, hydrocephalus, mass lesion, acute infarction, or significant intracranial injury. Vascular: No hyperdense vessel or unexpected calcification. Skull: No acute calvarial abnormality. Sinuses/Orbits: Visualized paranasal sinuses and mastoids clear. Orbital soft tissues unremarkable. Other: None IMPRESSION: Atrophy, chronic microvascular disease. No acute intracranial abnormality. Electronically Signed   By: Charlett Nose M.D.   On: 12/07/2019 11:01     EKG: Independently reviewed.  Atrial fibrillation, QTC 543, LAD, PVC, poor R wave progression, unstable baseline of strip of EKG   Assessment/Plan Principal Problem:   Acute on chronic systolic CHF (congestive heart failure) (HCC) Active Problems:   Elevated troponin   Gout   TIA (transient ischemic attack)   CAD (coronary artery disease)   HTN (hypertension)   HLD  (hyperlipidemia)   Atrial fibrillation (HCC)   CKD (chronic kidney disease), stage III   COVID-19 virus infection   Acute on chronic systolic CHF and elevated trop: 2D echo on 09/10/2019 showed EF 20-25%.  Patient has leg edema, elevated BNP, vascular congestion on chest x-ray, clinically consistent with CHF exacerbation.  Patient has a mildly elevated troponin 26.  No chest pain, most likely due to demand ischemia secondary to CHF exacerbation.  -Will admit to progressive unit as inpatient -Lasix 40 mg bid by IV (patient received 80 mg of Lasix in ED) -trend trop -2d echo -Daily weights -strict I/O's -Low salt diet -Fluid restriction -Obtain REDs Vest reading  COVID-19 virus infection: Patient does not have oxygen desaturation.  No obvious pulmonary infiltration on chest x-ray, but he has multiple comorbidities, he is at high risk of deteriorating.  Will start remdesivir treatment. Pt received 1 dose of ivermectin [id number] mcg in ED -Remdesivir per pharm -vitamin C, zinc.  -Bronchodilators -PRN Mucinex for cough -D-dimer, BNP,Trop, LFT, CRP, LDH, Procalcitonin, Ferritin, fibinogen, TG, Hep B SAg, HIV ab -Daily CRP, Ferritin, D-dimer,  Gout -Allopurinol  TIA (transient ischemic attack) -Aspirin  CAD (coronary artery disease): No chest pain, but has elevatedTroponin -see above work-up - Imdur -On aspirin,  Coreg  HTN (hypertension) -Coreg -IV hydralazine as needed  HLD (hyperlipidemia): Patient is not taking statin -Follow-up FLP  Atrial fibrillation (HCC): -Continue Coreg and amiodarone  CKD (chronic kidney disease), stage IIIa: Stable -Follow-up by BMP      DVT ppx: SQ Lovenox Code Status: DNR per her daughter and pt Family Communication:  Yes, patient's daughter at bed side Disposition Plan:  Anticipate discharge back to previous environment Consults called: None Admission status:   progressive unit as inpt      Status is: Inpatient  Remains inpatient  appropriate because:Inpatient level of care appropriate due to severity of illness.  Patient has multiple comorbidities, now presents with acute on chronic systolic CHF exacerbation and [id number] infection.  Patient also has positive troponin.  His presentation is highly complicated.  Patient is at high risk of deteriorating.  Will need to be treated in hospital for at least 2 days.   Dispo: The patient is from: Home  Anticipated d/c is to: Home              Anticipated d/c date is: 2 days              Patient currently is not medically stable to d/c.               Date of Service 12/07/2019    Lorretta HarpXilin Raeford [facility]   If 7PM-7AM, please contact night-coverage [email or web address] 12/07/2019, 11:30 AM

## 2019-12-07 NOTE — ED Notes (Signed)
Family leaving.  Pt alert.  Iv in place.

## 2019-12-07 NOTE — ED Notes (Signed)
Patient denies pain and is resting comfortably.  

## 2019-12-07 NOTE — ED Triage Notes (Signed)
Pt here for increased weakness from baseline and not eating for last couple days. Has also had a cough. No fever. Unlabored. VSS

## 2019-12-07 NOTE — ED Notes (Signed)
This RN spoke with family about pt via phone

## 2019-12-07 NOTE — ED Notes (Signed)
Pt resting quietly.  Waiting on admission.  Family with pt.

## 2019-12-07 NOTE — ED Notes (Signed)
Pt eating dinner tray.  Waiting on admission bed.

## 2019-12-07 NOTE — ED Notes (Signed)
Attempted to call report to floor.  Floor unable to accept patient at this time due to poor staffing overnight.  confirmed with [facility], Charge alerted.

## 2019-12-08 ENCOUNTER — Inpatient Hospital Stay
Admit: 2019-12-08 | Discharge: 2019-12-08 | Disposition: A | Discharge status: Home / Self Care | Payer: Medicare Other | Attending: Internal Medicine | Admitting: Internal Medicine

## 2019-12-08 ENCOUNTER — Encounter: Payer: Self-pay | Admitting: Internal Medicine

## 2019-12-08 DIAGNOSIS — Z7189 Other specified counseling: Secondary | ICD-10-CM

## 2019-12-08 DIAGNOSIS — Z515 Encounter for palliative care: Secondary | ICD-10-CM

## 2019-12-08 LAB — LIPID PANEL
Cholesterol: 57 mg/dL (ref 0–200)
HDL: 29 mg/dL — ABNORMAL LOW (ref >40)
LDL Cholesterol: 21 mg/dL (ref 0–99)
Total CHOL/HDL Ratio: 2 RATIO
Triglycerides: 33 mg/dL (ref <150)
VLDL: 7 mg/dL (ref 0–40)

## 2019-12-08 LAB — FERRITIN: Ferritin: 138 ng/mL (ref 24–336)

## 2019-12-08 LAB — COMPREHENSIVE METABOLIC PANEL
ALT: 22 U/L (ref 0–44)
AST: 26 U/L (ref 15–41)
Albumin: 3.2 g/dL — ABNORMAL LOW (ref 3.5–5.0)
Alkaline Phosphatase: 156 U/L — ABNORMAL HIGH (ref 38–126)
Anion gap: 7 (ref 5–15)
BUN: 29 mg/dL — ABNORMAL HIGH (ref 8–23)
CO2: 27 mmol/L (ref 22–32)
Calcium: 8.1 mg/dL — ABNORMAL LOW (ref 8.9–10.3)
Chloride: 109 mmol/L (ref 98–111)
Creatinine, Ser: 1.71 mg/dL — ABNORMAL HIGH (ref 0.61–1.24)
GFR calc Af Amer: 44 mL/min — ABNORMAL LOW (ref >60)
GFR calc non Af Amer: 38 mL/min — ABNORMAL LOW (ref >60)
Glucose, Bld: 104 mg/dL — ABNORMAL HIGH (ref 70–99)
Potassium: 3.2 mmol/L — ABNORMAL LOW (ref 3.5–5.1)
Sodium: 143 mmol/L (ref 135–145)
Total Bilirubin: 1.6 mg/dL — ABNORMAL HIGH (ref 0.3–1.2)
Total Protein: 6.7 g/dL (ref 6.5–8.1)

## 2019-12-08 LAB — CBC WITH DIFFERENTIAL/PLATELET
Abs Immature Granulocytes: 0.02 10*3/uL (ref 0.00–0.07)
Basophils Absolute: 0 10*3/uL (ref 0.0–0.1)
Basophils Relative: 0 %
Eosinophils Absolute: 0 10*3/uL (ref 0.0–0.5)
Eosinophils Relative: 0 %
HCT: 36.5 % — ABNORMAL LOW (ref 39.0–52.0)
Hemoglobin: 11.9 g/dL — ABNORMAL LOW (ref 13.0–17.0)
Immature Granulocytes: 0 %
Lymphocytes Relative: 16 %
Lymphs Abs: 1 10*3/uL (ref 0.7–4.0)
MCH: 29.7 pg (ref 26.0–34.0)
MCHC: 32.6 g/dL (ref 30.0–36.0)
MCV: 91 fL (ref 80.0–100.0)
Monocytes Absolute: 0.5 10*3/uL (ref 0.1–1.0)
Monocytes Relative: 8 %
Neutro Abs: 4.5 10*3/uL (ref 1.7–7.7)
Neutrophils Relative %: 76 %
Platelets: 125 10*3/uL — ABNORMAL LOW (ref 150–400)
RBC: 4.01 MIL/uL — ABNORMAL LOW (ref 4.22–5.81)
RDW: 16.4 % — ABNORMAL HIGH (ref 11.5–15.5)
WBC: 6 10*3/uL (ref 4.0–10.5)
nRBC: 0 % (ref 0.0–0.2)

## 2019-12-08 LAB — HEMOGLOBIN A1C
Hgb A1c MFr Bld: 6.9 % — ABNORMAL HIGH (ref 4.8–5.6)
Mean Plasma Glucose: 151.33 mg/dL

## 2019-12-08 LAB — ECHOCARDIOGRAM COMPLETE
Height: 71 in
Weight: 3291.03 oz

## 2019-12-08 LAB — TROPONIN I (HIGH SENSITIVITY): Troponin I (High Sensitivity): 24 ng/L — ABNORMAL HIGH (ref <18)

## 2019-12-08 LAB — C-REACTIVE PROTEIN: CRP: 4.6 mg/dL — ABNORMAL HIGH (ref <1.0)

## 2019-12-08 LAB — FIBRIN DERIVATIVES D-DIMER (ARMC ONLY): Fibrin derivatives D-dimer (ARMC): 404.36 ng/mL (FEU) (ref 0.00–499.00)

## 2019-12-08 LAB — MAGNESIUM: Magnesium: 2 mg/dL (ref 1.7–2.4)

## 2019-12-08 MED ORDER — ALLOPURINOL 100 MG PO TABS
300.0000 mg | ORAL_TABLET | Freq: Every day | ORAL | Status: DC
  Administered 2019-12-08 – 2019-12-14 (×7): 300 mg via ORAL
  Filled 2019-12-08 (×2): qty 1
  Filled 2019-12-08: qty 3
  Filled 2019-12-08 (×3): qty 1
  Filled 2019-12-08: qty 3
  Filled 2019-12-08: qty 1

## 2019-12-08 MED ORDER — POTASSIUM CHLORIDE CRYS ER 20 MEQ PO TBCR
40.0000 meq | EXTENDED_RELEASE_TABLET | Freq: Once | ORAL | Status: AC
  Administered 2019-12-08: 40 meq via ORAL
  Filled 2019-12-08: qty 2

## 2019-12-08 MED ORDER — ISOSORBIDE MONONITRATE ER 30 MG PO TB24
30.0000 mg | ORAL_TABLET | Freq: Every day | ORAL | Status: DC
  Administered 2019-12-08 – 2019-12-14 (×7): 30 mg via ORAL
  Filled 2019-12-08 (×7): qty 1

## 2019-12-08 MED ORDER — MONTELUKAST SODIUM 10 MG PO TABS
10.0000 mg | ORAL_TABLET | Freq: Every day | ORAL | Status: DC
  Administered 2019-12-08 – 2019-12-13 (×6): 10 mg via ORAL
  Filled 2019-12-08 (×6): qty 1

## 2019-12-08 MED ORDER — AMIODARONE HCL 200 MG PO TABS
200.0000 mg | ORAL_TABLET | Freq: Every day | ORAL | Status: DC
  Administered 2019-12-08 – 2019-12-14 (×7): 200 mg via ORAL
  Filled 2019-12-08 (×7): qty 1

## 2019-12-08 MED ORDER — CARVEDILOL 25 MG PO TABS
25.0000 mg | ORAL_TABLET | Freq: Two times a day (BID) | ORAL | Status: DC
  Administered 2019-12-08 – 2019-12-14 (×13): 25 mg via ORAL
  Filled 2019-12-08 (×14): qty 1

## 2019-12-08 NOTE — Progress Notes (Signed)
1        [facility] [place] NAME: Larry Lawrence    MR#:  [id number]  DATE OF BIRTH:  10/31/1940  SUBJECTIVE:  CHIEF COMPLAINT:   Chief Complaint  Patient presents with  . Weakness  Seen him in the ED.  Feeling short of breath and very weak REVIEW OF SYSTEMS:  Review of Systems  Constitutional: Positive for malaise/fatigue. Negative for diaphoresis, fever and weight loss.  HENT: Negative for ear discharge, ear pain, hearing loss, nosebleeds, sore throat and tinnitus.   Eyes: Negative for blurred vision and pain.  Respiratory: Positive for shortness of breath. Negative for cough, hemoptysis and wheezing.   Cardiovascular: Negative for chest pain, palpitations, orthopnea and leg swelling.  Gastrointestinal: Negative for abdominal pain, blood in stool, constipation, diarrhea, heartburn, nausea and vomiting.  Genitourinary: Negative for dysuria, frequency and urgency.  Musculoskeletal: Negative for back pain and myalgias.  Skin: Negative for itching and rash.  Neurological: Negative for dizziness, tingling, tremors, focal weakness, seizures, weakness and headaches.  Psychiatric/Behavioral: Negative for depression. The patient is not nervous/anxious.    DRUG ALLERGIES:   Allergies  Allergen Reactions  . Niaspan [Niacin Er] Other (See Comments)    Reaction:  Hot flashes    VITALS:  Blood pressure 121/89, pulse 88, temperature 97.9 F (36.6 C), temperature source Axillary, resp. rate (!) 22, height 5\' 11"  (1.803 m), weight 91.9 kg, SpO2 98 %. PHYSICAL EXAMINATION:  Physical Exam HENT:     Head: Normocephalic and atraumatic.  Eyes:     Conjunctiva/sclera: Conjunctivae normal.     Pupils: Pupils are equal, round, and reactive to light.  Neck:     Thyroid: No thyromegaly.     Trachea: No tracheal deviation.  Cardiovascular:     Rate and Rhythm: Normal rate and regular rhythm.     Heart sounds: Normal heart sounds.  Pulmonary:     Effort: Pulmonary  effort is normal. No respiratory distress.     Breath sounds: Normal breath sounds. No wheezing.  Chest:     Chest wall: No tenderness.  Abdominal:     General: Bowel sounds are normal. There is no distension.     Palpations: Abdomen is soft.     Tenderness: There is no abdominal tenderness.  Musculoskeletal:        General: Normal range of motion.     Cervical back: Normal range of motion and neck supple.  Skin:    General: Skin is warm and dry.     Findings: No rash.  Neurological:     Mental Status: He is alert and oriented to person, place, and time.     Cranial Nerves: No cranial nerve deficit.    LABORATORY PANEL:  Male CBC Recent Labs  Lab 12/08/19 0452  WBC 6.0  HGB 11.9*  HCT 36.5*  PLT 125*   ------------------------------------------------------------------------------------------------------------------ Chemistries  Recent Labs  Lab 12/08/19 0452  NA 143  K 3.2*  CL 109  CO2 27  GLUCOSE 104*  BUN 29*  CREATININE 1.71*  CALCIUM 8.1*  MG 2.0  AST 26  ALT 22  ALKPHOS 156*  BILITOT 1.6*   RADIOLOGY:  ECHOCARDIOGRAM COMPLETE  Result Date: 12/08/2019    ECHOCARDIOGRAM REPORT   Patient Name:   Larry Lawrence Date of Exam: 12/08/2019 Medical Rec #:  [id number]      Height:       71.0 in Accession #:    [id number]  Weight:       205.7 lb Date of Birth:  1940-08-05      BSA:          2.134 m Patient Age:    79 years       BP:           137/90 mmHg Patient Gender: M              HR:           100 bpm. Exam Location:  [facility] Procedure: 2D Echo, Cardiac Doppler and Color Doppler Indications:     CHF- acute diastolic 428.31  History:         Patient has prior history of Echocardiogram examinations, most                  recent 09/10/2019. CHF, CAD; TIA.  Sonographer:     Cristela Blue RDCS (AE) Referring Phys:  Kern Reap Brien Few NIU Diagnosing Phys: Arnoldo Hooker MD  Sonographer Comments: Technically challenging study due to limited acoustic windows and no apical window.  IMPRESSIONS  1. Left ventricular ejection fraction, by estimation, is 20 to 25%. The left ventricle has severely decreased function. The left ventricle demonstrates global hypokinesis. The left ventricular internal cavity size was moderately to severely dilated. Left ventricular diastolic function could not be evaluated.  2. Right ventricular systolic function is normal. The right ventricular size is normal. There is mildly elevated pulmonary artery systolic pressure.  3. Left atrial size was mildly dilated.  4. Right atrial size was mildly dilated.  5. The mitral valve is normal in structure. Mild to moderate mitral valve regurgitation.  6. The aortic valve is normal in structure. Aortic valve regurgitation is mild. FINDINGS  Left Ventricle: Left ventricular ejection fraction, by estimation, is 20 to 25%. The left ventricle has severely decreased function. The left ventricle demonstrates global hypokinesis. The left ventricular internal cavity size was moderately to severely  dilated. There is no left ventricular hypertrophy. Left ventricular diastolic function could not be evaluated. Right Ventricle: The right ventricular size is normal. No increase in right ventricular wall thickness. Right ventricular systolic function is normal. There is mildly elevated pulmonary artery systolic pressure. The tricuspid regurgitant velocity is 2.76  m/s, and with an assumed right atrial pressure of 10 mmHg, the estimated right ventricular systolic pressure is 40.5 mmHg. Left Atrium: Left atrial size was mildly dilated. Right Atrium: Right atrial size was mildly dilated. Pericardium: There is no evidence of pericardial effusion. Mitral Valve: The mitral valve is normal in structure. Mild to moderate mitral valve regurgitation. Tricuspid Valve: The tricuspid valve is normal in structure. Tricuspid valve regurgitation is mild. Aortic Valve: The aortic valve is normal in structure. Aortic valve regurgitation is mild. Pulmonic Valve:  The pulmonic valve was not assessed. Pulmonic valve regurgitation is not visualized. Aorta: The aortic root and ascending aorta are structurally normal, with no evidence of dilitation. IAS/Shunts: The interatrial septum was not assessed.  LEFT VENTRICLE PLAX 2D LVIDd:         6.26 cm LVIDs:         5.18 cm LV PW:         1.20 cm LV IVS:        1.03 cm LVOT diam:     2.30 cm LVOT Area:     4.15 cm  LEFT ATRIUM         Index LA diam:    5.60 cm 2.62 cm/m  PULMONIC VALVE AORTA                 PV Vmax:        0.60 m/s Ao Root diam: 3.00 cm PV Peak grad:   1.5 mmHg                       RVOT Peak grad: 1 mmHg  TRICUSPID VALVE TR Peak grad:   30.5 mmHg TR Vmax:        276.00 cm/s  SHUNTS Systemic Diam: 2.30 cm Arnoldo Hooker MD Electronically signed by Arnoldo Hooker MD Signature Date/Time: 12/08/2019/8:52:03 AM    Final    ASSESSMENT AND PLAN:   Acute on chronic systolic CHF and elevated trop: 2D echo on 09/10/2019 showed EF 20-25%.  Patient has leg edema, elevated BNP, vascular congestion on chest x-ray, clinically consistent with CHF exacerbation.  Patient has a mildly elevated troponin 26.  No chest pain, most likely due to demand ischemia secondary to CHF exacerbation. - Lasix 40 mg bid by IV  -2d echo shows EF of 20-25% -Daily weights -strict I/O's -Low salt diet -Fluid restriction -Obtain REDs Vest reading  COVID-19 virus infection: Patient does not have oxygen desaturation.  No obvious pulmonary infiltration on chest x-ray, but he has multiple comorbidities, he is at high risk of deteriorating.   Remdesivir day 2/5. Pt received 1 dose of ivermectin 70623 mcg in ED, no further need -vitamin C, zinc.  -Bronchodilators -PRN Mucinex for cough -Monitor inflammatory markers daily  Gout -Allopurinol  History of TIA (transient ischemic attack) -Aspirin  CAD (coronary artery disease): No chest pain,  troponins trending down - Imdur -On aspirin,  Coreg  HTN  (hypertension) -Coreg -IV hydralazine as needed  HLD (hyperlipidemia): Patient is not taking statin -LDL 21.  Hold off statin at this time  Atrial fibrillation [facility]): -Continue Coreg and amiodarone  CKD (chronic kidney disease), stage IIIa: Stable   Pressure Injury 09/12/19 Coccyx Medial Stage 1 -  Intact skin with non-blanchable redness of a localized area usually over a bony prominence. non-blanchable redness to sacral area (Active)  09/12/19 2155  Location: Coccyx  Location Orientation: Medial  Staging: Stage 1 -  Intact skin with non-blanchable redness of a localized area usually over a bony prominence.  Wound Description (Comments): non-blanchable redness to sacral area  Present on Admission: No     Status is: Inpatient  Remains inpatient appropriate because:IV treatments appropriate due to intensity of illness or inability to take PO   Dispo: The patient is from: SNF              Anticipated d/c is to: SNF              Anticipated d/c date is: > 3 days              Patient currently is not medically stable to d/c.  Needs to finish 5-day course of IV remdesivir while here in the hospital    DVT prophylaxis: Lovenox Family Communication: Discussed with patient   All the records are reviewed and case discussed with Care Management/Social Worker. Management plans discussed with the patient, nursing and they are in agreement.  CODE STATUS: DNR  TOTAL TIME TAKING CARE OF THIS PATIENT: 35 minutes.   More than 50% of the time was spent in counseling/coordination of care: YES  POSSIBLE D/C IN 3-4 DAYS, DEPENDING ON CLINICAL CONDITION.   Delfino Lovett M.D on 12/08/2019 at 4:53 PM  [facility]   CC: Primary care physician; [name], MD  Note: This dictation was prepared with Dragon dictation along with smaller phrase technology. Any transcriptional errors that result from this process are unintentional.

## 2019-12-08 NOTE — ED Notes (Signed)
ECHO at bedside.

## 2019-12-08 NOTE — Progress Notes (Signed)
Spoke with the RN about giving the patient the incentive spirometry and flutter valve. I left the breathing devices on the outside of the door of the patient's room. RN said that she would take the devices in & instruct since she was going in soon to give medications.

## 2019-12-08 NOTE — ED Notes (Signed)
Report off to butch rn  

## 2019-12-08 NOTE — Consult Note (Signed)
Consultation Note Date: 12/08/2019   Patient Name: Larry Lawrence  DOB: 01-09-1941  MRN: [id number]  Age / Sex: 79 y.o., male  PCP: [name], MD Referring Physician: Delfino Lovett, MD  Reason for Consultation: Establishing goals of care and Psychosocial/spiritual support  HPI/Patient Profile: 79 y.o. male coming from home who is at baseline partially dependent for most ADLs with past medical history of HTN/HLD, TIA, CHF with EF of 20%, A. fib not on anticoagulation, CAD, CKD 3, gout admitted on 12/07/2019 with acute on chronic systolic heart failure with elevated troponin.  Echo March 2021 with EF 20 to 25%.  Covid virus infection without oxygen desaturation and no obvious pulmonary infiltration on chest x-ray  Clinical Assessment and Goals of Care: I have reviewed medical records including EPIC notes, labs and imaging, visually examined the patient.  Call to Mr. Torbert room, but no answer.  Call to daughter Teige Rountree to discuss diagnosis prognosis, GOC, EOL wishes, disposition and options.  Tammy states that Mr. Brod has trouble hearing.   I introduced Palliative Medicine as specialized medical care for people living with serious illness. It focuses on providing relief from the symptoms and stress of a serious illness.   As far as functional and nutritional status, Mr. Sigal grandson Legrand Como lives with him, and has helped him with ADLs for about 1 year.    We talked about Mr. Deviney acute and chronic health concerns including but not limited to heart failure and Covid infection.  We talked about heart failure treatment plan and testing, EF, sodium and fluid restrictions, and taking extra fluid pills at times.  Tammy shares that Sharia Reeve helps manage Mr. Marasigan meds and at times will give him extra fluid pills for a few days.  She states that Mr. Bogacki is on fluid restrictions at this time, but  she is unsure of the amount.  We talked about Covid infection, the treatment plan, testing, respiratory status and treatments.  We talked about functional status, and Mr. Dufresne needing help with ADLs.  Tammy shares that she believes Mr. Ose would accept going to short-term rehab for a few weeks for strength and balance.  She states their preference would be [facility].  We discussed current illness and what it means in the larger context of her on-going co-morbidities.  Natural disease trajectory and expectations at EOL were discussed. Mr. Noone is currently DNR/DNI.  Questions and concerns were addressed.  The family was encouraged to call with questions or concerns.   Conference with attending, bedside nursing staff transition of care team related to patient condition, needs, goals of care, rehab.  HCPOA   NEXT OF KIN -daughter, Tilak Oakley.  Abagail Kitchens lives with Mr. Ferrebee and helps manage medications and assist with ADLs.    SUMMARY OF RECOMMENDATIONS   Treat the treatable, but no CPR or intubation  Anticipate that he will be agreeable to go to short-term rehab if qualified. Facility choice would be [facility]. Outpatient palliative to follow  Code Status/Advance Care Planning:  DNR  Symptom Management:   Per hospitalist, no additional needs at this time.   Palliative Prophylaxis:   Frequent Pain Assessment and Oral Care  Additional Recommendations (Limitations, Scope, Preferences):  treat the treatable, but no CPR or intubation    Psycho-social/Spiritual:   Desire for further Chaplaincy support:no  Additional Recommendations: Caregiving  Support/Resources and Education on Hospice  Prognosis:   Unable to determine, based on outcomes. 6 months or less would not be surprising based on chronic illness burden, CHF with EF of 20%, poor functional status.   Discharge Planning: To Be Determined      Primary Diagnoses: Present on  Admission: . Acute on chronic systolic CHF (congestive heart failure) (HCC) . Atrial fibrillation (HCC) . CAD (coronary artery disease) . HLD (hyperlipidemia) . HTN (hypertension) . TIA (transient ischemic attack) . Gout . CKD (chronic kidney disease), stage III . Elevated troponin . COVID-19 virus infection . Acute on chronic systolic (congestive) heart failure (HCC)   I have reviewed the medical record, interviewed the patient and family, and examined the patient. The following aspects are pertinent.  Past Medical History:  Diagnosis Date  . Allergy   . CHF (congestive heart failure) (HCC)   . Coronary artery disease   . Gout   . Insomnia   . Renal disorder   . Sleeps in sitting position due to orthopnea   . TIA (transient ischemic attack)    Social History   Socioeconomic History  . Marital status: Widowed    Spouse name: Not on file  . Number of children: 2  . Years of education: Not on file  . Highest education level: 12th grade  Occupational History  . Occupation: retired  Tobacco Use  . Smoking status: Former Smoker    Packs/day: 2.00    Years: 3.00    Pack years: 6.00    Types: Cigarettes    Quit date: 1960    Years since quitting: 61.4  . Smokeless tobacco: Never Used  . Tobacco comment: Smoking cessation materials not required  Substance and Sexual Activity  . Alcohol use: No  . Drug use: No  . Sexual activity: Not Currently  Other Topics Concern  . Not on file  Social History Narrative  . Not on file   Social Determinants of Health   Financial Resource Strain:   . Difficulty of Paying Living Expenses:   Food Insecurity:   . Worried About Programme researcher, broadcasting/film/video in the Last Year:   . Barista in the Last Year:   Transportation Needs:   . Freight forwarder (Medical):   Marland Kitchen Lack of Transportation (Non-Medical):   Physical Activity:   . Days of Exercise per Week:   . Minutes of Exercise per Session:   Stress:   . Feeling of Stress :    Social Connections:   . Frequency of Communication with Friends and Family:   . Frequency of Social Gatherings with Friends and Family:   . Attends Religious Services:   . Active Member of Clubs or Organizations:   . Attends Banker Meetings:   Marland Kitchen Marital Status:    Family History  Problem Relation Age of Onset  . Hypertension Mother   . Pancreatic cancer Mother   . Hypertension Father   . Prostate cancer Father    Scheduled Meds: . allopurinol  300 mg Oral Daily  . amiodarone  200 mg Oral Daily  . vitamin C  500 mg Oral Daily  . aspirin EC  81 mg Oral Daily  . carvedilol  25 mg Oral BID  . cholecalciferol  1,000 Units Oral BID WC  . enoxaparin (LOVENOX) injection  40 mg Subcutaneous Q24H  . furosemide  40 mg Intravenous Q12H  . ipratropium  2 puff Inhalation Q4H  . isosorbide mononitrate  30 mg Oral Daily  . montelukast  10 mg Oral QHS  . sodium chloride flush  3 mL Intravenous Q12H  . zinc sulfate  220 mg Oral Daily   Continuous Infusions: . sodium chloride 250 mL (12/08/19 1044)  . remdesivir 100 mg in NS 100 mL 100 mg (12/08/19 1045)   PRN Meds:.sodium chloride, acetaminophen, albuterol, dextromethorphan-guaiFENesin, hydrALAZINE, sodium chloride flush Medications Prior to Admission:  Prior to Admission medications   Medication Sig Start Date End Date Taking? Authorizing Provider  allopurinol (ZYLOPRIM) 300 MG tablet TAKE (1) TABLET BY MOUTH a day 06/09/19  Yes Juline Patch, MD  amiodarone (PACERONE) 200 MG tablet Take 200 mg by mouth daily. Dr Lupita Raider, MD  carvedilol (COREG) 12.5 MG tablet Take 2 tablets (25 mg total) by mouth 2 (two) times daily. Dr Nehemiah Massed 09/13/19 12/07/19 Yes Fritzi Mandes, MD  hydrALAZINE (APRESOLINE) 25 MG tablet Take 25 mg by mouth 2 (two) times daily.   Yes [provider]  isosorbide mononitrate (IMDUR) 30 MG 24 hr tablet Take 30 mg by mouth daily. kowalski   Yes [provider]   montelukast (SINGULAIR) 10 MG tablet TAKE (1) TABLET BY MOUTH DAILY AT BEDTIME 06/09/19  Yes Juline Patch, MD  Potassium Chloride ER 20 MEQ TBCR Take 20 mEq by mouth 2 (two) times daily.    Yes [provider]  torsemide (DEMADEX) 20 MG tablet Take 20 mg by mouth daily.   Yes [provider]  aspirin 81 MG EC tablet Take by mouth.    [provider]   Allergies  Allergen Reactions  . Niaspan [Niacin Er] Other (See Comments)    Reaction:  Hot flashes    Review of Systems  Unable to perform ROS: Age    Physical Exam Vitals and nursing note reviewed.     Vital Signs: BP 130/87 (BP Location: Right Arm)   Pulse 89   Temp 97.6 F (36.4 C)   Resp 18   Ht 5\' 11"  (1.803 m)   Wt 91.9 kg   SpO2 98%   BMI 28.26 kg/m  Pain Scale: 0-10   Pain Score: 0-No pain   SpO2: SpO2: 98 % O2 Device:SpO2: 98 % O2 Flow Rate: .   IO: Intake/output summary:   Intake/Output Summary (Last 24 hours) at 12/08/2019 1316 Last data filed at 12/08/2019 1000 Gross per 24 hour  Intake 320 ml  Output 825 ml  Net -505 ml    LBM: Last BM Date: 12/07/19 Baseline Weight: Weight: 93.3 kg Most recent weight: Weight: 91.9 kg     Palliative Assessment/Data:   Flowsheet Rows     Most Recent Value  Intake Tab  Referral Department  Hospitalist  Unit at Time of Referral  Other (Comment)  Palliative Care Primary Diagnosis  Pulmonary  Date Notified  12/08/19  Palliative Care Type  New Palliative care  Reason for referral  Clarify Goals of Care  Date of Admission  12/07/19  Date first seen by Palliative Care  12/08/19  # of days Palliative referral response time  0 Day(s)  # of days  IP prior to Palliative referral  1  Clinical Assessment  Palliative Performance Scale Score  40%  Pain Max last 24 hours  Not able to report  Pain Min Last 24 hours  Not able to report  Dyspnea Max Last 24 Hours  Not able to report  Dyspnea Min Last 24 hours  Not able to report  Psychosocial &  Spiritual Assessment  Palliative Care Outcomes      Time In: 1320 Time Out: 1430 Time Total: 70 minutes  Greater than 50%  of this time was spent counseling and coordinating care related to the above assessment and plan.  Signed by: Katheran Awe, NP   Please contact Palliative Medicine Team phone at [phone] for questions and concerns.  For individual provider: See Loretha Stapler

## 2019-12-08 NOTE — Plan of Care (Signed)

## 2019-12-08 NOTE — Evaluation (Signed)
Physical Therapy Evaluation Patient Details Name: Larry Lawrence MRN: [id number] DOB: [birth year] Today's Date: 12/08/2019   History of Present Illness  Pt is a 79 y.o. male presenting to hospital 6/8 with increased weakness and not eating past several days; also noted with cough and SOB.  Pt admitted with acute on chronic systolic CHF and elevated troponin (most likely d/t demand ischemia secondary CHF exacerbation); gout; also positive COVID.  PMH includes CHF, CAD, gout, insomnia, renal disorder, TIA, cardiac surgery with 3 stents, a-fib, joint replacement, and sleeps in sitting position d/t orthopnea.  Clinical Impression  Prior to hospital admission, pt reports being ambulatory with 4ww; lives with family who provides assist.  Pt reports he is currently in hospital d/t being unable to stand from his chair at home.  Currently pt is 1 assist with bed mobility (pt reports sleeping in recliner at home d/t needing to be more upright to breathe); min to mod assist x1 for transfers (x3 trials with bed height mildly elevated and use of RW); and CGA to min assist x1 to take steps in place with RW a few reps B LE's (pt kept reporting that he was having difficulty using RW--prefers his 4ww--and RW was limiting his mobility although generalized weakness noted).  Pt would benefit from skilled PT to address noted impairments and functional limitations (see below for any additional details).  Upon hospital discharge, pt would benefit from STR.    Follow Up Recommendations SNF    Equipment Recommendations       Recommendations for Other Services OT consult     Precautions / Restrictions Precautions Precautions: Fall Precaution Comments: Strict I/O Restrictions Weight Bearing Restrictions: No      Mobility  Bed Mobility Overal bed mobility: Needs Assistance Bed Mobility: Supine to Sit;Sit to Supine     Supine to sit: Min assist;Mod assist;HOB elevated Sit to supine: Mod assist;Max assist;HOB  elevated   General bed mobility comments: assist for B LE's to bring off edge of bed and assist to scoot to edge of bed; assist for trunk and B LE's sit to semi-supine in bed; 2 assist to boost pt up in bed using bed sheets end of session  Transfers Overall transfer level: Needs assistance Equipment used: Rolling walker (2 wheeled) Transfers: Sit to/from Stand Sit to Stand: Min assist;Mod assist         General transfer comment: x3 trials standing up to RW; bed height mildly elevated; assist to initiate and come to full stand  Ambulation/Gait Ambulation/Gait assistance: Min guard;Min assist Gait Distance (Feet): (march in place a few reps B LE's) Assistive device: Rolling walker (2 wheeled)   Gait velocity: decreased   General Gait Details: decreased B LE foot clearance  Stairs            Wheelchair Mobility    Modified Rankin (Stroke Patients Only)       Balance Overall balance assessment: Needs assistance Sitting-balance support: No upper extremity supported;Feet supported Sitting balance-Leahy Scale: Good Sitting balance - Comments: steady sitting reaching within BOS   Standing balance support: Single extremity supported Standing balance-Leahy Scale: Poor Standing balance comment: pt requiring at least single UE support for static standing balance                             Pertinent Vitals/Pain Pain Assessment: No/denies pain  Vitals (HR and O2 on room air) stable and WFL throughout treatment session.  Home Living Family/patient expects to be discharged to:: Private residence Living Arrangements: Other relatives(pt's grandson and grandson's wife) Available Help at Discharge: Family Type of Home: House Home Access: Stairs to enter Entrance Stairs-Rails: Right;Left;Can reach both Entrance Stairs-Number of Steps: 5 Home Layout: One level Home Equipment: Grab bars - toilet;Walker - 4 wheels;Bedside commode;Shower seat      Prior Function  Level of Independence: Needs assistance   Gait / Transfers Assistance Needed: Pt at baseline able to ambulate with 4ww modified independent in home; sleeps in recliner d/t difficulty breathing baseline  ADL's / Homemaking Assistance Needed: Assist for meals, medications, bathing (sits in shower seat by sink for sink bath), and driving        Hand Dominance        Extremity/Trunk Assessment   Upper Extremity Assessment Upper Extremity Assessment: Generalized weakness    Lower Extremity Assessment Lower Extremity Assessment: Generalized weakness    Cervical / Trunk Assessment Cervical / Trunk Assessment: (forward head/shoulders)  Communication   Communication: HOH  Cognition Arousal/Alertness: Awake/alert Behavior During Therapy: WFL for tasks assessed/performed Overall Cognitive Status: Within Functional Limits for tasks assessed                                 General Comments: Oriented to person, place, situation, and month/year (pt reports he normally does not keep track of the days)      General Comments   Nursing cleared pt for participation in physical therapy.  Pt agreeable to PT session.    Exercises  Transfer and mobility training   Assessment/Plan    PT Assessment Patient needs continued PT services  PT Problem List Decreased strength;Decreased activity tolerance;Decreased balance;Decreased mobility;Decreased knowledge of use of DME;Decreased knowledge of precautions       PT Treatment Interventions DME instruction;Gait training;Stair training;Functional mobility training;Therapeutic activities;Therapeutic exercise;Balance training;Patient/family education    PT Goals (Current goals can be found in the Care Plan section)  Acute Rehab PT Goals Patient Stated Goal: to improve strength and mobility PT Goal Formulation: With patient Time For Goal Achievement: 12/22/19 Potential to Achieve Goals: Good    Frequency Min 2X/week   Barriers to  discharge Decreased caregiver support      Co-evaluation               AM-PAC PT "6 Clicks" Mobility  Outcome Measure Help needed turning from your back to your side while in a flat bed without using bedrails?: None Help needed moving from lying on your back to sitting on the side of a flat bed without using bedrails?: A Little Help needed moving to and from a bed to a chair (including a wheelchair)?: A Lot Help needed standing up from a chair using your arms (e.g., wheelchair or bedside chair)?: A Lot Help needed to walk in hospital room?: A Lot Help needed climbing 3-5 steps with a railing? : Total 6 Click Score: 14    End of Session Equipment Utilized During Treatment: Gait belt Activity Tolerance: Patient tolerated treatment well Patient left: in bed;with call bell/phone within reach;with bed alarm set;with nursing/sitter in room Nurse Communication: Mobility status;Precautions PT Visit Diagnosis: Other abnormalities of gait and mobility (R26.89);Muscle weakness (generalized) (M62.81);History of falling (Z91.81);Difficulty in walking, not elsewhere classified (R26.2)    Time: 3154-0086 PT Time Calculation (min) (ACUTE ONLY): 50 min   Charges:   PT Evaluation $PT Eval Low Complexity: 1 Low PT Treatments $Therapeutic Activity:  23-37 mins       Hendricks Limes, PT 12/08/19, 1:05 PM

## 2019-12-08 NOTE — ED Notes (Signed)
Pt sleeping. Pt waiting on admission bed.

## 2019-12-08 NOTE — Progress Notes (Signed)
*  PRELIMINARY RESULTS* Echocardiogram 2D Echocardiogram has been performed.  Cristela Blue 12/08/2019, 8:38 AM

## 2019-12-08 NOTE — NC FL2 (Signed)
[place] LEVEL OF CARE SCREENING TOOL     IDENTIFICATION  Patient Name: Larry Lawrence Birthdate: 15-Jun-1941 Sex: male Admission Date (Current Location): 12/07/2019  [facility] and [place] Number:  Engineering geologist and Address:  [facility], [address], [place], [place] [address]      Provider Number: [id number]  Attending Physician Name and Address:  Max Sane, MD  Relative Name and Phone Number:       Current Level of Care: Hospital Recommended Level of Care: [place] Prior Approval Number:    Date Approved/Denied:   PASRR Number: [id number]  Discharge Plan: SNF    Current Diagnoses: Patient Active Problem List   Diagnosis Date Noted   Acute on chronic systolic CHF (congestive heart failure) ([place]) 12/07/2019   CKD (chronic kidney disease), stage III 12/07/2019   COVID-19 virus infection 12/07/2019   Acute on chronic systolic (congestive) heart failure ([place]) 12/07/2019   Pressure injury of skin 09/13/2019   Ambulatory dysfunction    Atrial fibrillation ([place])    Fall 09/09/2019   Leukocytosis 09/09/2019   TIA (transient ischemic attack)    CAD (coronary artery disease)    HTN (hypertension)    HLD (hyperlipidemia)    V tach ([place])    Right knee pain    Atrial fibrillation, new onset ([place])    Generalized weakness    Lymphedema 08/24/2018   Acute CHF (congestive heart failure) ([place]) 06/30/2018   Near syncope 04/24/2017   Bradycardia 04/24/2017   Gout 02/01/2016   Syncope 08/09/2015   Hypokalemia 08/09/2015   Elevated troponin 02/20/2015   Acute renal failure superimposed on stage 3a chronic kidney disease ([place]) 79/08/4095   Chronic systolic CHF (congestive heart failure) ([place]) 02/20/2015   CKD (chronic kidney disease) 02/20/2015    Orientation RESPIRATION BLADDER Height & Weight     Self, Place, Situation  Normal Incontinent Weight: 202 lb 9.6 oz (91.9 kg) Height:   5\' 11"  (180.3 cm)  BEHAVIORAL SYMPTOMS/MOOD NEUROLOGICAL BOWEL NUTRITION STATUS      Continent Diet(2 gram sodium)  AMBULATORY STATUS COMMUNICATION OF NEEDS Skin   Limited Assist Verbally Normal                       Personal Care Assistance Level of Assistance  Bathing, Feeding, Dressing Bathing Assistance: Limited assistance Feeding assistance: Independent Dressing Assistance: Limited assistance     Functional Limitations Info  Sight, Hearing, Speech Sight Info: Adequate Hearing Info: Adequate Speech Info: Adequate    SPECIAL CARE FACTORS FREQUENCY  PT (By licensed PT), OT (By licensed OT)     PT Frequency: 5x OT Frequency: 5x            Contractures Contractures Info: Not present    Additional Factors Info  Code Status, Allergies Code Status Info: DNR Allergies Info: Niaspan (Niacin Er)           Current Medications (12/08/2019):  This is the current hospital active medication list Current Facility-Administered Medications  Medication Dose Route Frequency Provider Last Rate Last Admin   0.9 %  sodium chloride infusion  250 mL Intravenous PRN Ivor Costa, MD 10 mL/hr at 12/08/19 1044 250 mL at 12/08/19 1044   acetaminophen (TYLENOL) tablet 650 mg  650 mg Oral Q6H PRN Ivor Costa, MD       albuterol (VENTOLIN HFA) 108 (90 Base) MCG/ACT inhaler 2 puff  2 puff Inhalation Q4H PRN Ivor Costa, MD  allopurinol (ZYLOPRIM) tablet 300 mg  300 mg Oral Daily Lorretta Harp, MD   300 mg at 12/08/19 1213   amiodarone (PACERONE) tablet 200 mg  200 mg Oral Daily Lorretta Harp, MD   200 mg at 12/08/19 1213   ascorbic acid (VITAMIN C) tablet 500 mg  500 mg Oral Daily Lorretta Harp, MD   500 mg at 12/08/19 0941   aspirin EC tablet 81 mg  81 mg Oral Daily Lorretta Harp, MD   81 mg at 12/08/19 0941   carvedilol (COREG) tablet 25 mg  25 mg Oral BID Lorretta Harp, MD   25 mg at 12/08/19 1213   cholecalciferol (VITAMIN D3) tablet 1,000 Units  1,000 Units Oral BID WC Lorretta Harp, MD    1,000 Units at 12/08/19 0941   dextromethorphan-guaiFENesin (MUCINEX DM) 30-600 MG per 12 hr tablet 1 tablet  1 tablet Oral BID PRN Lorretta Harp, MD   1 tablet at 12/08/19 1214   enoxaparin (LOVENOX) injection 40 mg  40 mg Subcutaneous Q24H Lorretta Harp, MD   40 mg at 12/07/19 2358   furosemide (LASIX) injection 40 mg  40 mg Intravenous Q12H Lorretta Harp, MD       hydrALAZINE (APRESOLINE) injection 5 mg  5 mg Intravenous Q2H PRN Lorretta Harp, MD       ipratropium (ATROVENT HFA) inhaler 2 puff  2 puff Inhalation Q4H Lorretta Harp, MD   2 puff at 12/08/19 1214   isosorbide mononitrate (IMDUR) 24 hr tablet 30 mg  30 mg Oral Daily Lorretta Harp, MD   30 mg at 12/08/19 1213   montelukast (SINGULAIR) tablet 10 mg  10 mg Oral QHS Lorretta Harp, MD       remdesivir 100 mg in sodium chloride 0.9 % 100 mL IVPB  100 mg Intravenous Daily Lorretta Harp, MD 200 mL/hr at 12/08/19 1045 100 mg at 12/08/19 1045   sodium chloride flush (NS) 0.9 % injection 3 mL  3 mL Intravenous Q12H Lorretta Harp, MD   3 mL at 12/08/19 0945   sodium chloride flush (NS) 0.9 % injection 3 mL  3 mL Intravenous PRN Lorretta Harp, MD       zinc sulfate capsule 220 mg  220 mg Oral Daily Lorretta Harp, MD   220 mg at 12/08/19 5830     Discharge Medications: Please see discharge summary for a list of discharge medications.  Relevant Imaging Results:  Relevant Lab Results:   Additional Information SSN:[id number]  Reuel Boom Zarria Towell, LCSW

## 2019-12-08 NOTE — Evaluation (Signed)
Occupational Therapy Evaluation Patient Details Name: Larry Lawrence MRN: [id number] DOB: 01/07/1941 Today's Date: 12/08/2019    History of Present Illness Pt is a 79 y.o. male presenting to hospital 6/8 with increased weakness and not eating past several days; also noted with cough and SOB.  Pt admitted with acute on chronic systolic CHF and elevated troponin (most likely d/t demand ischemia secondary CHF exacerbation); gout; also positive COVID.  PMH includes CHF, CAD, gout, insomnia, renal disorder, TIA, cardiac surgery with 3 stents, a-fib, joint replacement, and sleeps in sitting position d/t orthopnea.   Clinical Impression   Pt seen for OT evaluation this date. At baseline, pt reports living with son and DIL in [facility] with 5 STE. States he is able to do his basic self care, but has family assist for [facility] IADLs such as yard work, cooking and cleaning. Pt states he has 4WW at home that he uses for fxl mobility. Pt this date is requiring MOD to MAX A with ADL transfers with RW (trial from bed and from chair). In addition, pt requring increased assist with LB ADLs (MOD A in sitting). Overall, pt would benefit from continued skilled OT in acute setting to increase strength and tolerance as it pertains to ADLs. Anticipate pt will require SNF to increase strength/tolerance further to ensure safety/fall prevention prior to going home, upon d/c from hospital.    Follow Up Recommendations  SNF    Equipment Recommendations  Other (comment)(defer to next venue of care)    Recommendations for Other Services       Precautions / Restrictions Precautions Precautions: Fall Precaution Comments: Strict I/O Restrictions Weight Bearing Restrictions: No      Mobility Bed Mobility Overal bed mobility: Needs Assistance Bed Mobility: Supine to Sit     Supine to sit: Min assist;Mod assist;HOB elevated        Transfers Overall transfer level: Needs assistance Equipment used: Rolling walker (2  wheeled) Transfers: Sit to/from Stand Sit to Stand: Mod assist         General transfer comment: MOD A from elevated surface with RW, MAX A from lower recliner surface with RW. Assist to stabilize once to top of stand, demos slightly flexed hips still.    Balance Overall balance assessment: Needs assistance Sitting-balance support: No upper extremity supported;Feet supported Sitting balance-Leahy Scale: Good Sitting balance - Comments: G static sitting, F dynamic-needs UE support   Standing balance support: Bilateral upper extremity supported Standing balance-Leahy Scale: Poor Standing balance comment: requires UE support and MOD A external support/physical assist to sustain static stand                           ADL either performed or assessed with clinical judgement   ADL                                         General ADL Comments: Requires MIN/MOD A with LB ADLs (threading socks, limited knee ROM, h/o knee replacement to R knee), setup for UB ADLs in sitting. MOD A for ADL transfers with RW from elevated surface, MAX A from lower surface.     Vision Patient Visual Report: No change from baseline Additional Comments: decreased eye opening/vision in R eye, pt states baseline     Perception     Praxis      Pertinent Vitals/Pain  Pain Assessment: No/denies pain     Hand Dominance Right   Extremity/Trunk Assessment Upper Extremity Assessment Upper Extremity Assessment: Generalized weakness   Lower Extremity Assessment Lower Extremity Assessment: Generalized weakness   Cervical / Trunk Assessment Cervical / Trunk Assessment: (FWD head/shoulders)   Communication Communication Communication: HOH;Other (comment)(somewhat scratchy/garbled voice)   Cognition Arousal/Alertness: Awake/alert Behavior During Therapy: WFL for tasks assessed/performed Overall Cognitive Status: Within Functional Limits for tasks assessed                                  General Comments: Mostly oriented, difficulty with temporal concepts. Appropriate converstaionally, but some garbled speech making communication difficult.   General Comments       Exercises Other Exercises Other Exercises: OT facilitates education with pt re: role of OT, pt with MIN/MOD reception, difficulty hearing over fan in room for negative pressure. Other Exercises: OT facilitates education with pt re: fall prevention including use of call light and fall alarm under his bottom. Pt with MIN/MOD reception detected.   Shoulder Instructions      Home Living Family/patient expects to be discharged to:: Private residence Living Arrangements: Other relatives(son and DIL? difficult to understand/some garbled speech? different answers to this author versus answers on PT eval.) Available Help at Discharge: Family Type of Home: House Home Access: Stairs to enter [employer] of Steps: 5 Entrance Stairs-Rails: Right;Left;Can reach both Home Layout: One level     Bathroom Shower/Tub: Chief Strategy Officer: Handicapped height     Home Equipment: Grab bars - toilet;Walker - 4 wheels;Bedside commode;Shower seat          Prior Functioning/Environment Level of Independence: Needs assistance  Gait / Transfers Assistance Needed: Pt at baseline able to ambulate with 4ww modified independent in home; sleeps in recliner d/t difficulty breathing baseline ADL's / Homemaking Assistance Needed: Son/DIL Assist for meals, medications, bathing (sits in shower seat by sink for sink bath), and driving            OT Problem List: Decreased strength;Decreased activity tolerance;Impaired balance (sitting and/or standing);Decreased safety awareness;Decreased knowledge of use of DME or AE      OT Treatment/Interventions: Self-care/ADL training;Therapeutic exercise;Energy conservation;DME and/or AE instruction;Therapeutic activities;Balance  training;Patient/family education    OT Goals(Current goals can be found in the care plan section) Acute Rehab OT Goals Patient Stated Goal: to improve strength and mobility OT Goal Formulation: With patient Time For Goal Achievement: 12/22/19 Potential to Achieve Goals: Good  OT Frequency: Min 2X/week   Barriers to D/C:            Co-evaluation              AM-PAC OT "6 Clicks" Daily Activity     Outcome Measure Help from another person eating meals?: None Help from another person taking care of personal grooming?: A Little Help from another person toileting, which includes using toliet, bedpan, or urinal?: A Lot Help from another person bathing (including washing, rinsing, drying)?: A Lot Help from another person to put on and taking off regular upper body clothing?: A Little Help from another person to put on and taking off regular lower body clothing?: A Lot 6 Click Score: 16   End of Session Equipment Utilized During Treatment: Gait belt;Rolling walker Nurse Communication: Mobility status  Activity Tolerance: Patient tolerated treatment well Patient left: in chair;with call bell/phone within reach;with chair alarm set;with nursing/sitter in  room(nurse presents for med pass)  OT Visit Diagnosis: Unsteadiness on feet (R26.81);Other abnormalities of gait and mobility (R26.89);Muscle weakness (generalized) (M62.81)                Time: 2574-9355 OT Time Calculation (min): 38 min Charges:  OT General Charges $OT Visit: 1 Visit OT Evaluation $OT Eval Moderate Complexity: 1 Mod OT Treatments $Self Care/Home Management : 8-22 mins $Therapeutic Activity: 8-22 mins  Gerrianne Scale, MS, OTR/L ascom [phone] 12/08/19, 5:17 PM

## 2019-12-08 NOTE — TOC Initial Note (Signed)
Transition of Care [facility]) - Initial/Assessment Note    Patient Details  Name: Larry Lawrence MRN: [id number] Date of Birth: 17-Sep-1940  Transition of Care [facility]) CM/SW Contact:    Maree Krabbe, LCSW Phone Number: 12/08/2019, 3:28 PM  Clinical Narrative:     Pt is COVID +. CSW spoke with pt's Grandson via telephone. Pt's [name] states he was at [employer] prior to admission. Pt's Grandson would like for pt to return at d/c.              Expected Discharge Plan: Skilled Nursing Facility Barriers to Discharge: Continued Medical Work up   Patient Goals and CMS Choice Patient states their goals for this hospitalization and ongoing recovery are:: for pt to get better   Choice offered to / list presented to : Adult Children  Expected Discharge Plan and Services Expected Discharge Plan: Skilled Nursing Facility In-house Referral: Clinical Social Work   Post Acute Care Choice: Skilled Nursing Facility Living arrangements for the past 2 months: Single Family Home                                      Prior Living Arrangements/Services Living arrangements for the past 2 months: Single Family Home Lives with:: Self Patient language and need for interpreter reviewed:: Yes Do you feel safe going back to the place where you live?: Yes      Need for Family Participation in Patient Care: Yes (Comment) Care giver support system in place?: Yes (comment)   Criminal Activity/Legal Involvement Pertinent to Current Situation/Hospitalization: No - Comment as needed  Activities of Daily Living Home Assistive Devices/Equipment: Walker (specify type) ADL Screening (condition at time of admission) Patient's cognitive ability adequate to safely complete daily activities?: Yes Is the patient deaf or have difficulty hearing?: No Does the patient have difficulty seeing, even when wearing glasses/contacts?: No Does the patient have difficulty concentrating, remembering, or making  decisions?: No Patient able to express need for assistance with ADLs?: Yes Does the patient have difficulty dressing or bathing?: No Independently performs ADLs?: Yes (appropriate for developmental age) Does the patient have difficulty walking or climbing stairs?: Yes Weakness of Legs: None Weakness of Arms/Hands: None  Permission Sought/Granted Permission sought to share information with : Family Supports    Share Information with NAME: Sharia Reeve  Permission granted to share info w AGENCY: [facility]  Permission granted to share info w Relationship: grandson     Emotional Assessment Appearance:: Appears stated age Attitude/Demeanor/Rapport: Unable to Assess Affect (typically observed): Unable to Assess Orientation: : Oriented to Self, Oriented to Place, Oriented to Situation Alcohol / Substance Use: Not Applicable Psych Involvement: No (comment)  Admission diagnosis:  Weakness [R53.1] Acute on chronic systolic CHF (congestive heart failure) (HCC) [I50.23] Acute on chronic systolic (congestive) heart failure (HCC) [I50.23] COVID-19 [U07.1] Patient Active Problem List   Diagnosis Date Noted  . Goals of care, counseling/discussion   . Palliative care by specialist   . Acute on chronic systolic CHF (congestive heart failure) (HCC) 12/07/2019  . CKD (chronic kidney disease), stage III 12/07/2019  . COVID-19 virus infection 12/07/2019  . Acute on chronic systolic (congestive) heart failure (HCC) 12/07/2019  . Pressure injury of skin 09/13/2019  . Ambulatory dysfunction   . Atrial fibrillation (HCC)   . Fall 09/09/2019  . Leukocytosis 09/09/2019  . TIA (transient ischemic attack)   . CAD (coronary artery disease)   .  HTN (hypertension)   . HLD (hyperlipidemia)   . V tach ([place])   . Right knee pain   . Atrial fibrillation, new onset ([place])   . Generalized weakness   . Lymphedema 08/24/2018  . Acute CHF (congestive heart failure) ([place]) 06/30/2018  . Near syncope 04/24/2017   . Bradycardia 04/24/2017  . Gout 02/01/2016  . Syncope 08/09/2015  . Hypokalemia 08/09/2015  . Elevated troponin 02/20/2015  . Acute renal failure superimposed on stage 3a chronic kidney disease ([place]) 02/20/2015  . Chronic systolic CHF (congestive heart failure) ([place]) 02/20/2015  . CKD (chronic kidney disease) 02/20/2015   PCP:  Kirk Ruths, MD Pharmacy:   [facility], [place] - [address] [place] [address] Phone: [phone] Fax: [phone]     Social Determinants of Health (SDOH) Interventions    Readmission Risk Interventions No flowsheet data found.

## 2019-12-09 DIAGNOSIS — I2510 Atherosclerotic heart disease of native coronary artery without angina pectoris: Secondary | ICD-10-CM

## 2019-12-09 LAB — COMPREHENSIVE METABOLIC PANEL
ALT: 20 U/L (ref 0–44)
AST: 22 U/L (ref 15–41)
Albumin: 3 g/dL — ABNORMAL LOW (ref 3.5–5.0)
Alkaline Phosphatase: 153 U/L — ABNORMAL HIGH (ref 38–126)
Anion gap: 10 (ref 5–15)
BUN: 32 mg/dL — ABNORMAL HIGH (ref 8–23)
CO2: 25 mmol/L (ref 22–32)
Calcium: 8.3 mg/dL — ABNORMAL LOW (ref 8.9–10.3)
Chloride: 106 mmol/L (ref 98–111)
Creatinine, Ser: 1.59 mg/dL — ABNORMAL HIGH (ref 0.61–1.24)
GFR calc Af Amer: 48 mL/min — ABNORMAL LOW (ref >60)
GFR calc non Af Amer: 41 mL/min — ABNORMAL LOW (ref >60)
Glucose, Bld: 106 mg/dL — ABNORMAL HIGH (ref 70–99)
Potassium: 3.2 mmol/L — ABNORMAL LOW (ref 3.5–5.1)
Sodium: 141 mmol/L (ref 135–145)
Total Bilirubin: 1.3 mg/dL — ABNORMAL HIGH (ref 0.3–1.2)
Total Protein: 6.3 g/dL — ABNORMAL LOW (ref 6.5–8.1)

## 2019-12-09 LAB — CBC WITH DIFFERENTIAL/PLATELET
Abs Immature Granulocytes: 0.02 10*3/uL (ref 0.00–0.07)
Basophils Absolute: 0 10*3/uL (ref 0.0–0.1)
Basophils Relative: 0 %
Eosinophils Absolute: 0 10*3/uL (ref 0.0–0.5)
Eosinophils Relative: 0 %
HCT: 35.7 % — ABNORMAL LOW (ref 39.0–52.0)
Hemoglobin: 11.3 g/dL — ABNORMAL LOW (ref 13.0–17.0)
Immature Granulocytes: 0 %
Lymphocytes Relative: 14 %
Lymphs Abs: 0.9 10*3/uL (ref 0.7–4.0)
MCH: 29.1 pg (ref 26.0–34.0)
MCHC: 31.7 g/dL (ref 30.0–36.0)
MCV: 92 fL (ref 80.0–100.0)
Monocytes Absolute: 0.6 10*3/uL (ref 0.1–1.0)
Monocytes Relative: 9 %
Neutro Abs: 4.8 10*3/uL (ref 1.7–7.7)
Neutrophils Relative %: 77 %
Platelets: 123 10*3/uL — ABNORMAL LOW (ref 150–400)
RBC: 3.88 MIL/uL — ABNORMAL LOW (ref 4.22–5.81)
RDW: 16.3 % — ABNORMAL HIGH (ref 11.5–15.5)
WBC: 6.3 10*3/uL (ref 4.0–10.5)
nRBC: 0 % (ref 0.0–0.2)

## 2019-12-09 LAB — C-REACTIVE PROTEIN: CRP: 6.7 mg/dL — ABNORMAL HIGH (ref <1.0)

## 2019-12-09 LAB — MAGNESIUM: Magnesium: 2 mg/dL (ref 1.7–2.4)

## 2019-12-09 LAB — FERRITIN: Ferritin: 144 ng/mL (ref 24–336)

## 2019-12-09 LAB — FIBRIN DERIVATIVES D-DIMER (ARMC ONLY): Fibrin derivatives D-dimer (ARMC): 286.55 ng/mL (FEU) (ref 0.00–499.00)

## 2019-12-09 MED ORDER — POTASSIUM CHLORIDE CRYS ER 20 MEQ PO TBCR
40.0000 meq | EXTENDED_RELEASE_TABLET | Freq: Two times a day (BID) | ORAL | Status: AC
  Administered 2019-12-09 (×2): 40 meq via ORAL
  Filled 2019-12-09 (×2): qty 2

## 2019-12-09 NOTE — Plan of Care (Signed)

## 2019-12-09 NOTE — Progress Notes (Signed)
1        [place] [facility] [place] NAME: Larry Lawrence    MR#:  [id number]  DATE OF BIRTH:  01/22/1941  SUBJECTIVE:  CHIEF COMPLAINT:   Chief Complaint  Patient presents with  . Weakness  Shortness of breath improving feeling very weak REVIEW OF SYSTEMS:  Review of Systems  Constitutional: Positive for malaise/fatigue. Negative for diaphoresis, fever and weight loss.  HENT: Negative for ear discharge, ear pain, hearing loss, nosebleeds, sore throat and tinnitus.   Eyes: Negative for blurred vision and pain.  Respiratory: Positive for shortness of breath. Negative for cough, hemoptysis and wheezing.   Cardiovascular: Negative for chest pain, palpitations, orthopnea and leg swelling.  Gastrointestinal: Negative for abdominal pain, blood in stool, constipation, diarrhea, heartburn, nausea and vomiting.  Genitourinary: Negative for dysuria, frequency and urgency.  Musculoskeletal: Negative for back pain and myalgias.  Skin: Negative for itching and rash.  Neurological: Negative for dizziness, tingling, tremors, focal weakness, seizures, weakness and headaches.  Psychiatric/Behavioral: Negative for depression. The patient is not nervous/anxious.    DRUG ALLERGIES:   Allergies  Allergen Reactions  . Niaspan [Niacin Er] Other (See Comments)    Reaction:  Hot flashes    VITALS:  Blood pressure 98/73, pulse 82, temperature 98.1 F (36.7 C), temperature source Oral, resp. rate 18, height 5\' 11"  (1.803 m), weight 93 kg, SpO2 100 %. PHYSICAL EXAMINATION:  Physical Exam HENT:     Head: Normocephalic and atraumatic.  Eyes:     Conjunctiva/sclera: Conjunctivae normal.     Pupils: Pupils are equal, round, and reactive to light.  Neck:     Thyroid: No thyromegaly.     Trachea: No tracheal deviation.  Cardiovascular:     Rate and Rhythm: Normal rate and regular rhythm.     Heart sounds: Normal heart sounds.  Pulmonary:     Effort: Pulmonary effort is normal. No  respiratory distress.     Breath sounds: Normal breath sounds. No wheezing.  Chest:     Chest wall: No tenderness.  Abdominal:     General: Bowel sounds are normal. There is no distension.     Palpations: Abdomen is soft.     Tenderness: There is no abdominal tenderness.  Musculoskeletal:        General: Normal range of motion.     Cervical back: Normal range of motion and neck supple.  Skin:    General: Skin is warm and dry.     Findings: No rash.  Neurological:     Mental Status: He is alert and oriented to person, place, and time.     Cranial Nerves: No cranial nerve deficit.    LABORATORY PANEL:  Male CBC Recent Labs  Lab 12/09/19 0601  WBC 6.3  HGB 11.3*  HCT 35.7*  PLT 123*   ------------------------------------------------------------------------------------------------------------------ Chemistries  Recent Labs  Lab 12/09/19 0601  NA 141  K 3.2*  CL 106  CO2 25  GLUCOSE 106*  BUN 32*  CREATININE 1.59*  CALCIUM 8.3*  MG 2.0  AST 22  ALT 20  ALKPHOS 153*  BILITOT 1.3*   RADIOLOGY:  No results found. ASSESSMENT AND PLAN:   Acute on chronic systolic CHF and elevated trop: 2D echo on 09/10/2019 showed EF 20-25%.  Patient has leg edema, elevated BNP, vascular congestion on chest x-ray, clinically consistent with CHF exacerbation.  Patient has a mildly elevated troponin 26.  No chest pain, most likely due to demand ischemia secondary to  CHF exacerbation. - Lasix 40 mg bid by IV  -2d echo shows EF of 20-25% -Daily weights -strict I/O's.  -1.4 liters -Low salt diet -Fluid restriction -Obtain REDs Vest reading  COVID-19 virus infection: Patient does not have oxygen desaturation.  No obvious pulmonary infiltration on chest x-ray, but he has multiple comorbidities, he is at high risk of deteriorating.   Remdesivir day 3/5. Pt received 1 dose of ivermectin 25852 mcg in ED, no further need -vitamin C, zinc.  -Bronchodilators -PRN Mucinex for cough -Monitor  inflammatory markers daily  Gout -Allopurinol  History of TIA (transient ischemic attack) -Aspirin  CAD (coronary artery disease): No chest pain,  troponins trending down - Imdur -On aspirin,  Coreg  HTN (hypertension) -Coreg -IV hydralazine as needed  HLD (hyperlipidemia): Patient is not taking statin -LDL 21.  Hold off statin at this time  Atrial fibrillation [facility]): -Continue Coreg and amiodarone  CKD (chronic kidney disease), stage IIIa: Stable   Pressure Injury 09/12/19 Coccyx Medial Stage 1 -  Intact skin with non-blanchable redness of a localized area usually over a bony prominence. non-blanchable redness to sacral area (Active)  09/12/19 2155  Location: Coccyx  Location Orientation: Medial  Staging: Stage 1 -  Intact skin with non-blanchable redness of a localized area usually over a bony prominence.  Wound Description (Comments): non-blanchable redness to sacral area  Present on Admission: No     Status is: Inpatient  Remains inpatient appropriate because:IV treatments appropriate due to intensity of illness or inability to take PO   Dispo: The patient is from: SNF              Anticipated d/c is to: SNF              Anticipated d/c date is: 2 days              Patient currently is not medically stable to d/c.  Needs to finish 5-day course of IV remdesivir while here in the hospital    DVT prophylaxis: Lovenox Family Communication: Discussed with patient   All the records are reviewed and case discussed with Care Management/Social Worker. Management plans discussed with the patient, nursing and they are in agreement.  CODE STATUS: DNR  TOTAL TIME TAKING CARE OF THIS PATIENT: 35 minutes.   More than 50% of the time was spent in counseling/coordination of care: YES  POSSIBLE D/C IN 2-3 DAYS, DEPENDING ON CLINICAL CONDITION.   Delfino Lovett M.D on 12/09/2019 at 2:36 PM  [facility]   CC: Primary care physician; [name],  MD  Note: This dictation was prepared with Dragon dictation along with smaller phrase technology. Any transcriptional errors that result from this process are unintentional.

## 2019-12-09 NOTE — Progress Notes (Addendum)
Palliative: Mr. Mecham is resting well, with no trouble breathing.  Goals are set for rehab if possible.  Detailed conference with bedside nursing staff related to patient respiratory status and needs, PT/OT consults.   Conference with attending, nursing staff and [facility] team related to patient condition, needs, GOC.   Plan: Continue to treat the treatable but no CPR or intubation.  PT consult to rehab at [facility] if qualified.   15 minutes  Lillia Carmel, NP Palliative Medicine Team Team phone [phone] Greater than 50% of this time was spent counseling and coordinating care related to the above assessment and plan.

## 2019-12-10 LAB — CBC WITH DIFFERENTIAL/PLATELET
Abs Immature Granulocytes: 0.02 10*3/uL (ref 0.00–0.07)
Basophils Absolute: 0 10*3/uL (ref 0.0–0.1)
Basophils Relative: 0 %
Eosinophils Absolute: 0 10*3/uL (ref 0.0–0.5)
Eosinophils Relative: 0 %
HCT: 35.8 % — ABNORMAL LOW (ref 39.0–52.0)
Hemoglobin: 11.5 g/dL — ABNORMAL LOW (ref 13.0–17.0)
Immature Granulocytes: 0 %
Lymphocytes Relative: 16 %
Lymphs Abs: 1.1 10*3/uL (ref 0.7–4.0)
MCH: 29.4 pg (ref 26.0–34.0)
MCHC: 32.1 g/dL (ref 30.0–36.0)
MCV: 91.6 fL (ref 80.0–100.0)
Monocytes Absolute: 0.6 10*3/uL (ref 0.1–1.0)
Monocytes Relative: 9 %
Neutro Abs: 4.8 10*3/uL (ref 1.7–7.7)
Neutrophils Relative %: 75 %
Platelets: 159 10*3/uL (ref 150–400)
RBC: 3.91 MIL/uL — ABNORMAL LOW (ref 4.22–5.81)
RDW: 16.2 % — ABNORMAL HIGH (ref 11.5–15.5)
WBC: 6.5 10*3/uL (ref 4.0–10.5)
nRBC: 0 % (ref 0.0–0.2)

## 2019-12-10 LAB — MAGNESIUM: Magnesium: 2.1 mg/dL (ref 1.7–2.4)

## 2019-12-10 LAB — COMPREHENSIVE METABOLIC PANEL
ALT: 21 U/L (ref 0–44)
AST: 20 U/L (ref 15–41)
Albumin: 3.1 g/dL — ABNORMAL LOW (ref 3.5–5.0)
Alkaline Phosphatase: 155 U/L — ABNORMAL HIGH (ref 38–126)
Anion gap: 9 (ref 5–15)
BUN: 34 mg/dL — ABNORMAL HIGH (ref 8–23)
CO2: 24 mmol/L (ref 22–32)
Calcium: 8 mg/dL — ABNORMAL LOW (ref 8.9–10.3)
Chloride: 104 mmol/L (ref 98–111)
Creatinine, Ser: 1.57 mg/dL — ABNORMAL HIGH (ref 0.61–1.24)
GFR calc Af Amer: 49 mL/min — ABNORMAL LOW (ref >60)
GFR calc non Af Amer: 42 mL/min — ABNORMAL LOW (ref >60)
Glucose, Bld: 106 mg/dL — ABNORMAL HIGH (ref 70–99)
Potassium: 3.5 mmol/L (ref 3.5–5.1)
Sodium: 137 mmol/L (ref 135–145)
Total Bilirubin: 1.3 mg/dL — ABNORMAL HIGH (ref 0.3–1.2)
Total Protein: 6.2 g/dL — ABNORMAL LOW (ref 6.5–8.1)

## 2019-12-10 LAB — FERRITIN: Ferritin: 154 ng/mL (ref 24–336)

## 2019-12-10 LAB — C-REACTIVE PROTEIN: CRP: 7.8 mg/dL — ABNORMAL HIGH (ref <1.0)

## 2019-12-10 LAB — FIBRIN DERIVATIVES D-DIMER (ARMC ONLY): Fibrin derivatives D-dimer (ARMC): 311.08 ng/mL (FEU) (ref 0.00–499.00)

## 2019-12-10 MED ORDER — POTASSIUM CHLORIDE CRYS ER 20 MEQ PO TBCR
40.0000 meq | EXTENDED_RELEASE_TABLET | Freq: Once | ORAL | Status: AC
  Administered 2019-12-10: 40 meq via ORAL
  Filled 2019-12-10: qty 2

## 2019-12-10 NOTE — Plan of Care (Signed)

## 2019-12-10 NOTE — Progress Notes (Signed)
1        [place] at [place] NAME: Larry Lawrence    MR#:  [id number]  DATE OF BIRTH:  09-Aug-1940  SUBJECTIVE:  CHIEF COMPLAINT:   Chief Complaint  Patient presents with  . Weakness  Remains weak, no new complaints REVIEW OF SYSTEMS:  Review of Systems  Constitutional: Positive for malaise/fatigue. Negative for diaphoresis, fever and weight loss.  HENT: Negative for ear discharge, ear pain, hearing loss, nosebleeds, sore throat and tinnitus.   Eyes: Negative for blurred vision and pain.  Respiratory: Positive for shortness of breath. Negative for cough, hemoptysis and wheezing.   Cardiovascular: Negative for chest pain, palpitations, orthopnea and leg swelling.  Gastrointestinal: Negative for abdominal pain, blood in stool, constipation, diarrhea, heartburn, nausea and vomiting.  Genitourinary: Negative for dysuria, frequency and urgency.  Musculoskeletal: Negative for back pain and myalgias.  Skin: Negative for itching and rash.  Neurological: Negative for dizziness, tingling, tremors, focal weakness, seizures, weakness and headaches.  Psychiatric/Behavioral: Negative for depression. The patient is not nervous/anxious.    DRUG ALLERGIES:   Allergies  Allergen Reactions  . Niaspan [Niacin Er] Other (See Comments)    Reaction:  Hot flashes    VITALS:  Blood pressure 115/78, pulse 66, temperature 98.2 F (36.8 C), temperature source Oral, resp. rate 18, height 5\' 11"  (1.803 m), weight 93.5 kg, SpO2 98 %. PHYSICAL EXAMINATION:  Physical Exam HENT:     Head: Normocephalic and atraumatic.  Eyes:     Conjunctiva/sclera: Conjunctivae normal.     Pupils: Pupils are equal, round, and reactive to light.  Neck:     Thyroid: No thyromegaly.     Trachea: No tracheal deviation.  Cardiovascular:     Rate and Rhythm: Normal rate and regular rhythm.     Heart sounds: Normal heart sounds.  Pulmonary:     Effort: Pulmonary effort is normal. No respiratory  distress.     Breath sounds: Normal breath sounds. No wheezing.  Chest:     Chest wall: No tenderness.  Abdominal:     General: Bowel sounds are normal. There is no distension.     Palpations: Abdomen is soft.     Tenderness: There is no abdominal tenderness.  Musculoskeletal:        General: Normal range of motion.     Cervical back: Normal range of motion and neck supple.  Skin:    General: Skin is warm and dry.     Findings: No rash.  Neurological:     Mental Status: He is alert and oriented to person, place, and time.     Cranial Nerves: No cranial nerve deficit.    LABORATORY PANEL:  Male CBC Recent Labs  Lab 12/10/19 0711  WBC 6.5  HGB 11.5*  HCT 35.8*  PLT 159   ------------------------------------------------------------------------------------------------------------------ Chemistries  Recent Labs  Lab 12/10/19 0711  NA 137  K 3.5  CL 104  CO2 24  GLUCOSE 106*  BUN 34*  CREATININE 1.57*  CALCIUM 8.0*  MG 2.1  AST 20  ALT 21  ALKPHOS 155*  BILITOT 1.3*   RADIOLOGY:  No results found. ASSESSMENT AND PLAN:   Acute on chronic systolic CHF and elevated trop: 2D echo on 09/10/2019 showed EF 20-25%.  Patient has leg edema, elevated BNP, vascular congestion on chest x-ray, clinically consistent with CHF exacerbation.  Patient has a mildly elevated troponin 26.  No chest pain, most likely due to demand ischemia secondary to CHF exacerbation. -  Lasix 40 mg bid by IV  -2d echo shows EF of 20-25% -Daily weights -strict I/O's.  -2.2 liters -Low salt diet -Fluid restriction -Obtain REDs Vest reading  COVID-19 virus infection: Patient does not have oxygen desaturation.  No obvious pulmonary infiltration on chest x-ray, but he has multiple comorbidities, he is at high risk of deteriorating.   Remdesivir day 4/5. Pt received 1 dose of ivermectin 89373 mcg in ED, no further need -vitamin C, zinc.  -Bronchodilators -PRN Mucinex for cough -Monitor inflammatory  markers daily  Gout -Allopurinol  History of TIA (transient ischemic attack) -Aspirin  CAD (coronary artery disease): No chest pain,  troponins trending down - Imdur -On aspirin,  Coreg  HTN (hypertension) -Coreg -IV hydralazine as needed  HLD (hyperlipidemia): Patient is not taking statin -LDL 21.  Hold off statin at this time  Atrial fibrillation [facility]): -Continue Coreg and amiodarone  CKD (chronic kidney disease), stage IIIa: Stable   Pressure Injury 09/12/19 Coccyx Medial Stage 1 -  Intact skin with non-blanchable redness of a localized area usually over a bony prominence. non-blanchable redness to sacral area (Active)  09/12/19 2155  Location: Coccyx  Location Orientation: Medial  Staging: Stage 1 -  Intact skin with non-blanchable redness of a localized area usually over a bony prominence.  Wound Description (Comments): non-blanchable redness to sacral area  Present on Admission: No   TOC team is aware of him needing SNF.  They are working on [facility] [employer]  Status is: Inpatient  Remains inpatient appropriate because:IV treatments appropriate due to intensity of illness or inability to take PO   Dispo: The patient is from: SNF              Anticipated d/c is to: SNF              Anticipated d/c date is: 1 day              Patient currently is not medically stable to d/c.  Needs to finish 5-day course of IV remdesivir while here in the hospital    DVT prophylaxis: Lovenox Family Communication: Discussed with patient   All the records are reviewed and case discussed with Care Management/Social Worker. Management plans discussed with the patient, nursing and they are in agreement.  CODE STATUS: DNR  TOTAL TIME TAKING CARE OF THIS PATIENT: 35 minutes.   More than 50% of the time was spent in counseling/coordination of care: YES  POSSIBLE D/C IN 1-2 DAYS, DEPENDING ON CLINICAL CONDITION.   Delfino Lovett M.D on 12/10/2019  at 1:39 PM  [facility]   CC: Primary care physician; [name], MD  Note: This dictation was prepared with Dragon dictation along with smaller phrase technology. Any transcriptional errors that result from this process are unintentional.

## 2019-12-10 NOTE — TOC Progression Note (Signed)
Transition of Care [facility]) - Progression Note    Patient Details  Name: Larry Lawrence MRN: [id number] Date of Birth: [birth year]  [facility]) CM/SW Contact  Maree Krabbe, LCSW Phone Number: 12/10/2019, 9:15 AM  Clinical Narrative: Spoke with pt's son and explained only bed offer at this time is [facility]--pt's son understanding and agreeable. CSW got verbal permission from pt's son to initiate insurance auth for [facility].      Expected Discharge Plan: Skilled Nursing Facility Barriers to Discharge: Continued Medical Work up  Expected Discharge Plan and Services Expected Discharge Plan: Skilled Nursing Facility In-house Referral: Clinical Social Work   Post Acute Care Choice: Skilled Nursing Facility Living arrangements for the past 2 months: Single Family Home                                       Social Determinants of Health (SDOH) Interventions    Readmission Risk Interventions No flowsheet data found.

## 2019-12-10 NOTE — Progress Notes (Signed)
[facility] Room 232 [facility]) Hospital Liaison RN note:  New referral received for [employer] community palliative program to follow at [facility] healthcare, received from palliative NP, Lillia Carmel.  Information has been sent to referral. TOC, Bridget Cobb aware.  Thank you for the referral.  Cyndra Numbers, RN [facility] Liaison [phone]

## 2019-12-10 NOTE — Care Management Important Message (Signed)
Important Message  Patient Details  Name: Larry Lawrence MRN: [id number] Date of Birth: [birth year]   Medicare Important Message Given:  Yes  Reviewed verbally with patient over room phone due to isolation status.  Will give copy to unit secretary to have nursing staff deliver to patient next time someone goes into room.     Johnell Comings 12/10/2019, 3:57 PM

## 2019-12-10 NOTE — Progress Notes (Signed)
Palliative:   Larry Lawrence is progressing nicely.  His vital signs remained stable.  Call to grandson, main caregiver who lives in his home, Larry Lawrence.  We talked about Larry Lawrence vital signs, current health concerns, disposition to [facility] for rehab.  I encouraged Larry Lawrence to work closely with Child psychotherapist onsite for any future needs.  Conference with attending, bedside nursing staff, transition of care team related to patient condition, needs, goals of care, disposition.  Plan: Continue to treat the treatable but no CPR or intubation.  Discharged to [facility] for short-term rehab, ultimate goal is to return to his own home.  Outpatient palliative services to follow.  25 minutes  Larry Carmel, NP Palliative medicine team Team phone [phone] Greater than 50% of this time was spent counseling and coordinating care related to the above assessment and plan.

## 2019-12-11 LAB — CBC WITH DIFFERENTIAL/PLATELET
Abs Immature Granulocytes: 0.02 10*3/uL (ref 0.00–0.07)
Basophils Absolute: 0 10*3/uL (ref 0.0–0.1)
Basophils Relative: 0 %
Eosinophils Absolute: 0.1 10*3/uL (ref 0.0–0.5)
Eosinophils Relative: 1 %
HCT: 35.6 % — ABNORMAL LOW (ref 39.0–52.0)
Hemoglobin: 11.5 g/dL — ABNORMAL LOW (ref 13.0–17.0)
Immature Granulocytes: 0 %
Lymphocytes Relative: 17 %
Lymphs Abs: 1 10*3/uL (ref 0.7–4.0)
MCH: 29.3 pg (ref 26.0–34.0)
MCHC: 32.3 g/dL (ref 30.0–36.0)
MCV: 90.8 fL (ref 80.0–100.0)
Monocytes Absolute: 0.5 10*3/uL (ref 0.1–1.0)
Monocytes Relative: 9 %
Neutro Abs: 4.3 10*3/uL (ref 1.7–7.7)
Neutrophils Relative %: 73 %
Platelets: 170 10*3/uL (ref 150–400)
RBC: 3.92 MIL/uL — ABNORMAL LOW (ref 4.22–5.81)
RDW: 16.1 % — ABNORMAL HIGH (ref 11.5–15.5)
WBC: 5.9 10*3/uL (ref 4.0–10.5)
nRBC: 0 % (ref 0.0–0.2)

## 2019-12-11 LAB — COMPREHENSIVE METABOLIC PANEL
ALT: 17 U/L (ref 0–44)
AST: 19 U/L (ref 15–41)
Albumin: 2.9 g/dL — ABNORMAL LOW (ref 3.5–5.0)
Alkaline Phosphatase: 149 U/L — ABNORMAL HIGH (ref 38–126)
Anion gap: 11 (ref 5–15)
BUN: 36 mg/dL — ABNORMAL HIGH (ref 8–23)
CO2: 24 mmol/L (ref 22–32)
Calcium: 8.3 mg/dL — ABNORMAL LOW (ref 8.9–10.3)
Chloride: 103 mmol/L (ref 98–111)
Creatinine, Ser: 1.57 mg/dL — ABNORMAL HIGH (ref 0.61–1.24)
GFR calc Af Amer: 49 mL/min — ABNORMAL LOW (ref >60)
GFR calc non Af Amer: 42 mL/min — ABNORMAL LOW (ref >60)
Glucose, Bld: 112 mg/dL — ABNORMAL HIGH (ref 70–99)
Potassium: 3.5 mmol/L (ref 3.5–5.1)
Sodium: 138 mmol/L (ref 135–145)
Total Bilirubin: 1 mg/dL (ref 0.3–1.2)
Total Protein: 6.2 g/dL — ABNORMAL LOW (ref 6.5–8.1)

## 2019-12-11 LAB — MAGNESIUM: Magnesium: 2.1 mg/dL (ref 1.7–2.4)

## 2019-12-11 LAB — C-REACTIVE PROTEIN: CRP: 7.8 mg/dL — ABNORMAL HIGH (ref <1.0)

## 2019-12-11 LAB — FIBRIN DERIVATIVES D-DIMER (ARMC ONLY): Fibrin derivatives D-dimer (ARMC): 309.34 ng/mL (FEU) (ref 0.00–499.00)

## 2019-12-11 LAB — FERRITIN: Ferritin: 139 ng/mL (ref 24–336)

## 2019-12-11 NOTE — Progress Notes (Signed)
Physical Therapy Treatment Patient Details Name: Larry Lawrence MRN: [id number] DOB: 1941-05-04 Today's Date: 12/11/2019    History of Present Illness Pt is a 79 y.o. male presenting to hospital 6/8 with increased weakness and not eating past several days; also noted with cough and SOB.  Pt admitted with acute on chronic systolic CHF and elevated troponin (most likely d/t demand ischemia secondary CHF exacerbation); gout; also positive COVID.  PMH includes CHF, CAD, gout, insomnia, renal disorder, TIA, cardiac surgery with 3 stents, a-fib, joint replacement, and sleeps in sitting position d/t orthopnea.    PT Comments    Pt was long sitting in bed upon arriving. He agrees to PT session and is cooperative throughout. BP 134/77 sao2 96% on rm air and HR stable throughout.  Pt required mod-max assist to exit and re-enter bed. He requires increased time for all mobility and transfers 2/2 to weakness. Tolerated standing at EOB 3 x with mod assist + moderate vcs for hand placement and technique. Pt does fatigue quickly.  Max assist to reposition to [facility] at conclusion of session. He had call bell in reach and bed alarm in place. Pt will benefit from SNF at DC to address strength, balance, and overall safe functional mobility deficits.    Follow Up Recommendations  SNF     Equipment Recommendations  Other (comment) (defer to next level of care)    Recommendations for Other Services       Precautions / Restrictions Precautions Precautions: Fall Precaution Comments: Strict I/O Restrictions Weight Bearing Restrictions: No    Mobility  Bed Mobility Overal bed mobility: Needs Assistance Bed Mobility: Supine to Sit;Sit to Supine     Supine to sit: Mod assist;HOB elevated Sit to supine: Mod assist;Max assist   General bed mobility comments: Pt is very weak throughout. Required mod assist to exit L side of bed and max assist to return to supine  Transfers Overall transfer level: Needs  assistance Equipment used: Rolling walker (2 wheeled) Transfers: Sit to/from Stand Sit to Stand: Mod assist;From elevated surface         General transfer comment: Pt was able to stand 3 x EOB with mod assist. increased time to perform with vcs for hand placement and fwd wt shift. pt does not like using regular RW but was agreeable with encouragement. was able to tolerate standing ~ 30 sec each trial prior to fatigue and needing seated rest.  Ambulation/Gait Ambulation/Gait assistance: Mod assist Gait Distance (Feet): 2 Feet Assistive device: Rolling walker (2 wheeled) Gait Pattern/deviations: Step-to pattern Gait velocity: decreased   General Gait Details: decreased B LE foot clearance. Pt only able to take 2 steps laterally at EOB to [facility]. pt unable to clear LEs 2/2 to weakness. poor standing posture throughout.   Stairs             Wheelchair Mobility    Modified Rankin (Stroke Patients Only)       Balance Overall balance assessment: Needs assistance Sitting-balance support: Feet supported Sitting balance-Leahy Scale: Fair     Standing balance support: Bilateral upper extremity supported Standing balance-Leahy Scale: Poor                              Cognition Arousal/Alertness: Awake/alert Behavior During Therapy: WFL for tasks assessed/performed Overall Cognitive Status: Within Functional Limits for tasks assessed  General Comments: pt was A and O but fatigues quickly with incraesed time to process required      Exercises      General Comments        Pertinent Vitals/Pain Pain Assessment: No/denies pain    Home Living                      Prior Function            PT Goals (current goals can now be found in the care plan section) Acute Rehab PT Goals Patient Stated Goal: to improve strength and mobility Progress towards PT goals: Progressing toward goals    Frequency     Min 2X/week      PT Plan Current plan remains appropriate    Co-evaluation              AM-PAC PT "6 Clicks" Mobility   Outcome Measure  Help needed turning from your back to your side while in a flat bed without using bedrails?: A Lot Help needed moving from lying on your back to sitting on the side of a flat bed without using bedrails?: A Lot Help needed moving to and from a bed to a chair (including a wheelchair)?: A Lot Help needed standing up from a chair using your arms (e.g., wheelchair or bedside chair)?: A Lot Help needed to walk in hospital room?: A Lot Help needed climbing 3-5 steps with a railing? : Total 6 Click Score: 11    End of Session Equipment Utilized During Treatment: Gait belt Activity Tolerance: Patient tolerated treatment well Patient left: in bed;with call bell/phone within reach;with bed alarm set;with nursing/sitter in room Nurse Communication: Mobility status;Precautions PT Visit Diagnosis: Other abnormalities of gait and mobility (R26.89);Muscle weakness (generalized) (M62.81);History of falling (Z91.81);Difficulty in walking, not elsewhere classified (R26.2)     Time: 7893-8101 PT Time Calculation (min) (ACUTE ONLY): 20 min  Charges:  $Therapeutic Activity: 8-22 mins                     Julaine Fusi PTA 12/11/19, 10:28 AM

## 2019-12-11 NOTE — Progress Notes (Signed)
1        [place] [facility]   PATIENT NAME: Larry Lawrence    MR#:  [id number]  DATE OF BIRTH:  [birth year]  SUBJECTIVE:  CHIEF COMPLAINT:   Chief Complaint  Patient presents with  . Weakness  Remains weak, shortness of breath improving. would like to get released if possible REVIEW OF SYSTEMS:  Review of Systems  Constitutional: Positive for malaise/fatigue. Negative for diaphoresis, fever and weight loss.  HENT: Negative for ear discharge, ear pain, hearing loss, nosebleeds, sore throat and tinnitus.   Eyes: Negative for blurred vision and pain.  Respiratory: Positive for shortness of breath. Negative for cough, hemoptysis and wheezing.   Cardiovascular: Negative for chest pain, palpitations, orthopnea and leg swelling.  Gastrointestinal: Negative for abdominal pain, blood in stool, constipation, diarrhea, heartburn, nausea and vomiting.  Genitourinary: Negative for dysuria, frequency and urgency.  Musculoskeletal: Negative for back pain and myalgias.  Skin: Negative for itching and rash.  Neurological: Negative for dizziness, tingling, tremors, focal weakness, seizures, weakness and headaches.  Psychiatric/Behavioral: Negative for depression. The patient is not nervous/anxious.    DRUG ALLERGIES:   Allergies  Allergen Reactions  . Niaspan [Niacin Er] Other (See Comments)    Reaction:  Hot flashes    VITALS:  Blood pressure (!) 133/96, pulse 83, temperature 98.7 F (37.1 C), temperature source Oral, resp. rate 18, height 5\' 11"  (1.803 m), weight 92.2 kg, SpO2 99 %. PHYSICAL EXAMINATION:  Physical Exam HENT:     Head: Normocephalic and atraumatic.  Eyes:     Conjunctiva/sclera: Conjunctivae normal.     Pupils: Pupils are equal, round, and reactive to light.  Neck:     Thyroid: No thyromegaly.     Trachea: No tracheal deviation.  Cardiovascular:     Rate and Rhythm: Normal rate and regular rhythm.     Heart sounds: Normal heart sounds.  Pulmonary:      Effort: Pulmonary effort is normal. No respiratory distress.     Breath sounds: Normal breath sounds. No wheezing.  Chest:     Chest wall: No tenderness.  Abdominal:     General: Bowel sounds are normal. There is no distension.     Palpations: Abdomen is soft.     Tenderness: There is no abdominal tenderness.  Musculoskeletal:        General: Normal range of motion.     Cervical back: Normal range of motion and neck supple.  Skin:    General: Skin is warm and dry.     Findings: No rash.  Neurological:     Mental Status: He is alert and oriented to person, place, and time.     Cranial Nerves: No cranial nerve deficit.    LABORATORY PANEL:  Male CBC Recent Labs  Lab 12/11/19 0622  WBC 5.9  HGB 11.5*  HCT 35.6*  PLT 170   ------------------------------------------------------------------------------------------------------------------ Chemistries  Recent Labs  Lab 12/11/19 0622  NA 138  K 3.5  CL 103  CO2 24  GLUCOSE 112*  BUN 36*  CREATININE 1.57*  CALCIUM 8.3*  MG 2.1  AST 19  ALT 17  ALKPHOS 149*  BILITOT 1.0   RADIOLOGY:  No results found. ASSESSMENT AND PLAN:   Acute on chronic systolic CHF and elevated trop: 2D echo on 09/10/2019 showed EF 20-25%.  Patient has leg edema, elevated BNP, vascular congestion on chest x-ray, clinically consistent with CHF exacerbation.  Patient has a mildly elevated troponin 26.  No chest pain, most  likely due to demand ischemia secondary to CHF exacerbation. - Lasix 40 mg bid by IV  -2d echo shows EF of 20-25% -Daily weights -strict I/O's.  -3.1 liters -Low salt diet -Fluid restriction -Obtain REDs Vest reading  COVID-19 virus infection: Patient does not have oxygen desaturation.  No obvious pulmonary infiltration on chest x-ray, but he has multiple comorbidities, he is at high risk of deteriorating.   Remdesivir day 5/5. Pt received 1 dose of ivermectin 18000 mcg in ED, no further need -vitamin C, zinc.   -Bronchodilators -PRN Mucinex for cough -Monitor inflammatory markers daily - He is not requiring any oxygen.  He is on room air now  Gout -Allopurinol  History of TIA (transient ischemic attack) -Aspirin  CAD (coronary artery disease): No chest pain,  troponins trending down - Imdur -On aspirin,  Coreg  HTN (hypertension) -Coreg -IV hydralazine as needed  HLD (hyperlipidemia): Patient is not taking statin -LDL 21.  Hold off statin at this time  Atrial fibrillation [facility]): -Continue Coreg and amiodarone  CKD (chronic kidney disease), stage IIIa: Stable   Pressure Injury 09/12/19 Coccyx Medial Stage 1 -  Intact skin with non-blanchable redness of a localized area usually over a bony prominence. non-blanchable redness to sacral area (Active)  09/12/19 2155  Location: Coccyx  Location Orientation: Medial  Staging: Stage 1 -  Intact skin with non-blanchable redness of a localized area usually over a bony prominence.  Wound Description (Comments): non-blanchable redness to sacral area  Present on Admission: No   TOC team is aware of him needing SNF.  They are working on Dentist for [employer]. patient is medically stable for discharge as he completes his fifth day of IV remdesivir today on 6/12  Status is: Inpatient  Remains inpatient appropriate because:IV treatments appropriate due to intensity of illness or inability to take PO   Dispo: The patient is from: SNF              Anticipated d/c is to: SNF              Anticipated d/c date is: 1 day              Patient currently is medically stable to d/c.  Waiting for insurance authorization at this point.  TOC team is aware.   DVT prophylaxis: enoxaparin (LOVENOX) injection 40 mg Start: 12/07/19 2200  Family Communication: Discussed with patient   All the records are reviewed and case discussed with Care Management/Social Worker. Management plans discussed with the patient, nursing and  they are in agreement.  CODE STATUS: DNR  TOTAL TIME TAKING CARE OF THIS PATIENT: 35 minutes.   More than 50% of the time was spent in counseling/coordination of care: YES  POSSIBLE D/C IN 1 DAYS, DEPENDING ON CLINICAL CONDITION.   Max Sane M.D on 12/11/2019 at 12:02 PM  [facility]   CC: Primary care physician; Kirk Ruths, MD  Note: This dictation was prepared with Dragon dictation along with smaller phrase technology. Any transcriptional errors that result from this process are unintentional.

## 2019-12-11 NOTE — TOC Progression Note (Signed)
Transition of Care [facility]) - Progression Note    Patient Details  Name: TREVARIS PENNELLA MRN: [id number] Date of Birth: 1941/06/14  Transition of Care [facility]) CM/SW Contact  Maree Krabbe, LCSW Phone Number: 12/11/2019, 3:56 PM  Clinical Narrative:   Authorization still pending, reference number [id number].   Expected Discharge Plan: Skilled Nursing Facility Barriers to Discharge: Continued Medical Work up  Expected Discharge Plan and Services Expected Discharge Plan: Skilled Nursing Facility In-house Referral: Clinical Social Work   Post Acute Care Choice: Skilled Nursing Facility Living arrangements for the past 2 months: Single Family Home                                       Social Determinants of Health (SDOH) Interventions    Readmission Risk Interventions No flowsheet data found.

## 2019-12-12 LAB — CBC WITH DIFFERENTIAL/PLATELET
Abs Immature Granulocytes: 0.04 10*3/uL (ref 0.00–0.07)
Basophils Absolute: 0 10*3/uL (ref 0.0–0.1)
Basophils Relative: 0 %
Eosinophils Absolute: 0.1 10*3/uL (ref 0.0–0.5)
Eosinophils Relative: 1 %
HCT: 35.6 % — ABNORMAL LOW (ref 39.0–52.0)
Hemoglobin: 11.2 g/dL — ABNORMAL LOW (ref 13.0–17.0)
Immature Granulocytes: 1 %
Lymphocytes Relative: 17 %
Lymphs Abs: 1.1 10*3/uL (ref 0.7–4.0)
MCH: 28.6 pg (ref 26.0–34.0)
MCHC: 31.5 g/dL (ref 30.0–36.0)
MCV: 90.8 fL (ref 80.0–100.0)
Monocytes Absolute: 0.6 10*3/uL (ref 0.1–1.0)
Monocytes Relative: 9 %
Neutro Abs: 4.7 10*3/uL (ref 1.7–7.7)
Neutrophils Relative %: 72 %
Platelets: 182 10*3/uL (ref 150–400)
RBC: 3.92 MIL/uL — ABNORMAL LOW (ref 4.22–5.81)
RDW: 16.1 % — ABNORMAL HIGH (ref 11.5–15.5)
WBC: 6.6 10*3/uL (ref 4.0–10.5)
nRBC: 0 % (ref 0.0–0.2)

## 2019-12-12 LAB — COMPREHENSIVE METABOLIC PANEL
ALT: 15 U/L (ref 0–44)
AST: 15 U/L (ref 15–41)
Albumin: 2.8 g/dL — ABNORMAL LOW (ref 3.5–5.0)
Alkaline Phosphatase: 149 U/L — ABNORMAL HIGH (ref 38–126)
Anion gap: 8 (ref 5–15)
BUN: 36 mg/dL — ABNORMAL HIGH (ref 8–23)
CO2: 27 mmol/L (ref 22–32)
Calcium: 8.2 mg/dL — ABNORMAL LOW (ref 8.9–10.3)
Chloride: 103 mmol/L (ref 98–111)
Creatinine, Ser: 1.55 mg/dL — ABNORMAL HIGH (ref 0.61–1.24)
GFR calc Af Amer: 49 mL/min — ABNORMAL LOW (ref >60)
GFR calc non Af Amer: 43 mL/min — ABNORMAL LOW (ref >60)
Glucose, Bld: 110 mg/dL — ABNORMAL HIGH (ref 70–99)
Potassium: 3 mmol/L — ABNORMAL LOW (ref 3.5–5.1)
Sodium: 138 mmol/L (ref 135–145)
Total Bilirubin: 1.3 mg/dL — ABNORMAL HIGH (ref 0.3–1.2)
Total Protein: 6.1 g/dL — ABNORMAL LOW (ref 6.5–8.1)

## 2019-12-12 LAB — FERRITIN: Ferritin: 141 ng/mL (ref 24–336)

## 2019-12-12 LAB — C-REACTIVE PROTEIN: CRP: 6.1 mg/dL — ABNORMAL HIGH (ref <1.0)

## 2019-12-12 LAB — MAGNESIUM: Magnesium: 2.1 mg/dL (ref 1.7–2.4)

## 2019-12-12 LAB — FIBRIN DERIVATIVES D-DIMER (ARMC ONLY): Fibrin derivatives D-dimer (ARMC): 359.11 ng/mL (FEU) (ref 0.00–499.00)

## 2019-12-12 NOTE — Progress Notes (Signed)
Occupational Therapy Treatment Patient Details Name: Larry Lawrence MRN: [id number] DOB: Sep 17, 1940 Today's Date: 12/12/2019    History of present illness Pt is a 79 y.o. male presenting to hospital 6/8 with increased weakness and not eating past several days; also noted with cough and SOB.  Pt admitted with acute on chronic systolic CHF and elevated troponin (most likely d/t demand ischemia secondary CHF exacerbation); gout; also positive COVID.  PMH includes CHF, CAD, gout, insomnia, renal disorder, TIA, cardiac surgery with 3 stents, a-fib, joint replacement, and sleeps in sitting position d/t orthopnea.   OT comments  Larry Lawrence was seen for OT treatment on this date. Upon arrival to room pt long sitting in bed reporting no pain and agreeable to tx. Pt instructed in importance of mobility for functional strengthening, falls prevention, energy conservation. Requiring MAX A to don B socks seated EOB. MAX A sup<>sit c MOD A to maintain static sitting balance at EOB - pt unable to maintain balance c BUE support, requires MOD A to correct posterior lean. Pt able to clear rear sit<>stand c MAX A + elevated surface - unable to achieve upright posture this date. Pt continues to benefit from skilled OT services to maximize return to PLOF and minimize risk of future falls, injury, caregiver burden, and readmission. Will continue to follow POC. Discharge recommendation remains appropriate.    Follow Up Recommendations  SNF    Equipment Recommendations  Other (comment) (defer to next venue of care)    Recommendations for Other Services      Precautions / Restrictions Precautions Precautions: Fall Precaution Comments: Strict I/O Restrictions Weight Bearing Restrictions: No       Mobility Bed Mobility Overal bed mobility: Needs Assistance Bed Mobility: Supine to Sit;Sit to Supine     Supine to sit: Max assist;HOB elevated Sit to supine: Max assist      Transfers Overall transfer level:  Needs assistance   Transfers: Sit to/from Stand Sit to Stand: Max assist;From elevated surface         General transfer comment: Cleared rear c MAX A - unable to achieve full upright posture    Balance Overall balance assessment: Needs assistance Sitting-balance support: Feet supported;Bilateral upper extremity supported Sitting balance-Leahy Scale: Poor Sitting balance - Comments: MOD A maintain static sitting, pt requires MOD A to maintain neutral, MAX A to lean to L side Postural control: Posterior lean;Right lateral lean                                 ADL either performed or assessed with clinical judgement   ADL Overall ADL's : Needs assistance/impaired                                       General ADL Comments: MAX A don B socks seated EOB. MOD A maintain static sitting balance at EOB - pt unable to maintain balance c BUE support, requires MOD A to correct posterior lean      Vision       Perception     Praxis      Cognition Arousal/Alertness: Awake/alert Behavior During Therapy: WFL for tasks assessed/performed Overall Cognitive Status: Within Functional Limits for tasks assessed  General Comments: Questionsable HoH vs cognition        Exercises Exercises: Other exercises Other Exercises Other Exercises: Pt educated re: importance of mobility for functional strengthening, falls prevention, energy conservation Other Exercises: LBD, sitting balance/tolerance, sup<>sit, sit<>stand, lateral scooting   Shoulder Instructions       General Comments      Pertinent Vitals/ Pain       Pain Assessment: No/denies pain  Home Living Family/patient expects to be discharged to:: Private residence Living Arrangements: Other relatives Available Help at Discharge: Family Type of Home: House Home Access: Stairs to enter                                Prior  Functioning/Environment              Frequency  Min 2X/week        Progress Toward Goals  OT Goals(current goals can now be found in the care plan section)  Progress towards OT goals: Progressing toward goals  Acute Rehab OT Goals Patient Stated Goal: to improve strength and mobility OT Goal Formulation: With patient Time For Goal Achievement: 12/22/19 Potential to Achieve Goals: Good ADL Goals Pt Will Perform Lower Body Dressing: with supervision;sit to/from stand Pt Will Transfer to Toilet: with supervision;ambulating;bedside commode (c RW to [facility] ~15' away to increase tolerance for fxl HH dist) Pt Will Perform Toileting - Clothing Manipulation and hygiene: with supervision;sit to/from stand Pt/caregiver will Perform Home Exercise Program: Increased strength;Both right and left upper extremity;With Supervision  Plan Discharge plan remains appropriate;Frequency remains appropriate    Co-evaluation                 AM-PAC OT "6 Clicks" Daily Activity     Outcome Measure   Help from another person eating meals?: None Help from another person taking care of personal grooming?: A Little Help from another person toileting, which includes using toliet, bedpan, or urinal?: A Lot Help from another person bathing (including washing, rinsing, drying)?: A Lot Help from another person to put on and taking off regular upper body clothing?: A Little Help from another person to put on and taking off regular lower body clothing?: A Lot 6 Click Score: 16    End of Session    OT Visit Diagnosis: Unsteadiness on feet (R26.81);Other abnormalities of gait and mobility (R26.89);Muscle weakness (generalized) (M62.81)   Activity Tolerance Patient tolerated treatment well   Patient Left in bed;with call bell/phone within reach;with bed alarm set   Nurse Communication          Time: 6578-4696 OT Time Calculation (min): 32 min  Charges: OT General Charges $OT Visit: 1  Visit OT Treatments $Self Care/Home Management : 23-37 mins  Dessie Coma, M.S. OTR/L  12/12/19, 3:51 PM

## 2019-12-12 NOTE — Progress Notes (Signed)
Patient called to report nose bleed. Assessed and then  NT [place] cleaned patient. No significant amount of blood. Possibly thinks blew nose too hard.

## 2019-12-12 NOTE — Progress Notes (Signed)
1        [place] at [facility]   PATIENT NAME: Larry Lawrence    MR#:  [id number]  DATE OF BIRTH:  07-May-1941  SUBJECTIVE:  CHIEF COMPLAINT:   Chief Complaint  Patient presents with  . Weakness  Remains weak, wants to go.  Waiting for insurance authorization REVIEW OF SYSTEMS:  Review of Systems  Constitutional: Positive for malaise/fatigue. Negative for diaphoresis, fever and weight loss.  HENT: Negative for ear discharge, ear pain, hearing loss, nosebleeds, sore throat and tinnitus.   Eyes: Negative for blurred vision and pain.  Respiratory: Positive for shortness of breath. Negative for cough, hemoptysis and wheezing.   Cardiovascular: Negative for chest pain, palpitations, orthopnea and leg swelling.  Gastrointestinal: Negative for abdominal pain, blood in stool, constipation, diarrhea, heartburn, nausea and vomiting.  Genitourinary: Negative for dysuria, frequency and urgency.  Musculoskeletal: Negative for back pain and myalgias.  Skin: Negative for itching and rash.  Neurological: Negative for dizziness, tingling, tremors, focal weakness, seizures, weakness and headaches.  Psychiatric/Behavioral: Negative for depression. The patient is not nervous/anxious.    DRUG ALLERGIES:   Allergies  Allergen Reactions  . Niaspan [Niacin Er] Other (See Comments)    Reaction:  Hot flashes    VITALS:  Blood pressure (!) 128/96, pulse 67, temperature 98.7 F (37.1 C), temperature source Oral, resp. rate 19, height 5\' 11"  (1.803 m), weight 95 kg, SpO2 99 %. PHYSICAL EXAMINATION:  Physical Exam HENT:     Head: Normocephalic and atraumatic.  Eyes:     Conjunctiva/sclera: Conjunctivae normal.     Pupils: Pupils are equal, round, and reactive to light.  Neck:     Thyroid: No thyromegaly.     Trachea: No tracheal deviation.  Cardiovascular:     Rate and Rhythm: Normal rate and regular rhythm.     Lawrence sounds: Normal Lawrence sounds.  Pulmonary:     Effort: Pulmonary  effort is normal. No respiratory distress.     Breath sounds: Normal breath sounds. No wheezing.  Chest:     Chest wall: No tenderness.  Abdominal:     General: Bowel sounds are normal. There is no distension.     Palpations: Abdomen is soft.     Tenderness: There is no abdominal tenderness.  Musculoskeletal:        General: Normal range of motion.     Cervical back: Normal range of motion and neck supple.  Skin:    General: Skin is warm and dry.     Findings: No rash.  Neurological:     Mental Status: He is alert and oriented to person, place, and time.     Cranial Nerves: No cranial nerve deficit.    LABORATORY PANEL:  Male CBC Recent Labs  Lab 12/12/19 0624  WBC 6.6  HGB 11.2*  HCT 35.6*  PLT 182   ------------------------------------------------------------------------------------------------------------------ Chemistries  Recent Labs  Lab 12/12/19 0624  NA 138  K 3.0*  CL 103  CO2 27  GLUCOSE 110*  BUN 36*  CREATININE 1.55*  CALCIUM 8.2*  MG 2.1  AST 15  ALT 15  ALKPHOS 149*  BILITOT 1.3*   RADIOLOGY:  No results found. ASSESSMENT AND PLAN:   Acute on chronic systolic CHF and elevated trop: 2D echo on 09/10/2019 showed EF 20-25%.  Patient has leg edema, elevated BNP, vascular congestion on chest x-ray, clinically consistent with CHF exacerbation.  Patient has a mildly elevated troponin 26.  No chest pain, most likely due to  demand ischemia secondary to CHF exacerbation. - Lasix 40 mg bid by IV  -2d echo shows EF of 20-25% -Daily weights -strict I/O's.  -4.5 liters -Low salt diet -Fluid restriction -Obtain REDs Vest reading  COVID-19 virus infection: Patient does not have oxygen desaturation.  No obvious pulmonary infiltration on chest x-ray, but he has multiple comorbidities, he is at high risk of deteriorating.   Completed remdesivir day 5/5. Pt received 1 dose of ivermectin 18000 mcg in ED, no further need -vitamin C, zinc.   -Bronchodilators -PRN Mucinex for cough -Monitor inflammatory markers daily - He is not requiring any oxygen.  He is on room air now  Gout -Allopurinol  History of TIA (transient ischemic attack) -Aspirin  CAD (coronary artery disease): No chest pain,  troponins trending down - Imdur -On aspirin,  Coreg  HTN (hypertension) -Coreg -IV hydralazine as needed  HLD (hyperlipidemia): Patient is not taking statin -LDL 21.  Hold off statin at this time  Atrial fibrillation [facility]): -Continue Coreg and amiodarone  CKD (chronic kidney disease), stage IIIa: Stable   Pressure Injury 09/12/19 Coccyx Medial Stage 1 -  Intact skin with non-blanchable redness of a localized area usually over a bony prominence. non-blanchable redness to sacral area (Active)  09/12/19 2155  Location: Coccyx  Location Orientation: Medial  Staging: Stage 1 -  Intact skin with non-blanchable redness of a localized area usually over a bony prominence.  Wound Description (Comments): non-blanchable redness to sacral area  Present on Admission: No   TOC team is aware of him needing SNF.  They are working on Dentist for [employer]. patient is medically stable for discharge as he completes his fifth day of IV remdesivir today on 6/12  Status is: Inpatient  Remains inpatient appropriate because:IV treatments appropriate due to intensity of illness or inability to take PO   Dispo: The patient is from: SNF              Anticipated d/c is to: SNF              Anticipated d/c date is: 1 day              Patient currently is medically stable to d/c.  Still waiting for insurance authorization.  TOC team is aware.   DVT prophylaxis: enoxaparin (LOVENOX) injection 40 mg Start: 12/07/19 2200  Family Communication: Discussed with patient   All the records are reviewed and case discussed with Care Management/Social Worker. Management plans discussed with the patient, nursing and they are  in agreement.  CODE STATUS: DNR  TOTAL TIME TAKING CARE OF THIS PATIENT: 35 minutes.   More than 50% of the time was spent in counseling/coordination of care: YES  POSSIBLE D/C IN 1 DAYS, DEPENDING ON CLINICAL CONDITION.   Max Sane M.D on 12/12/2019 at 11:44 AM  [facility]   CC: Primary care physician; Kirk Ruths, MD  Note: This dictation was prepared with Dragon dictation along with smaller phrase technology. Any transcriptional errors that result from this process are unintentional.

## 2019-12-12 NOTE — TOC Progression Note (Signed)
Transition of Care [facility]) - Progression Note    Patient Details  Name: GWYNN CROSSLEY MRN: [id number] Date of Birth: 1940-08-13  Transition of Care [facility]) CM/SW Contact  Marina Goodell Phone Number: [phone] 12/12/2019, 11:34 AM  Clinical Narrative:     CSW spoke with attending w/ patient placement.  Insurance authorization still pending, REF [id number]. This CSW informed attending, I would update him once I received authorization information.  [facility] has approved bed offer, pending insurance auth.  Expected Discharge Plan: Skilled Nursing Facility Barriers to Discharge: Continued Medical Work up  Expected Discharge Plan and Services Expected Discharge Plan: Skilled Nursing Facility In-house Referral: Clinical Social Work   Post Acute Care Choice: Skilled Nursing Facility Living arrangements for the past 2 months: Single Family Home                                       Social Determinants of Health (SDOH) Interventions    Readmission Risk Interventions No flowsheet data found.

## 2019-12-13 LAB — MAGNESIUM: Magnesium: 2.3 mg/dL (ref 1.7–2.4)

## 2019-12-13 MED ORDER — POTASSIUM CHLORIDE CRYS ER 20 MEQ PO TBCR
40.0000 meq | EXTENDED_RELEASE_TABLET | Freq: Once | ORAL | Status: AC
  Administered 2019-12-13: 40 meq via ORAL
  Filled 2019-12-13: qty 2

## 2019-12-13 MED ORDER — FUROSEMIDE 40 MG PO TABS
40.0000 mg | ORAL_TABLET | Freq: Two times a day (BID) | ORAL | Status: DC
  Administered 2019-12-13 – 2019-12-14 (×2): 40 mg via ORAL
  Filled 2019-12-13: qty 1

## 2019-12-13 NOTE — Progress Notes (Signed)
1        [place] at [place] NAME: Larry Lawrence    MR#:  [id number]  DATE OF BIRTH:  1941-02-16  SUBJECTIVE:  CHIEF COMPLAINT:   Chief Complaint  Patient presents with  . Weakness  Remains weak, wants to go.  Tired of being here REVIEW OF SYSTEMS:  Review of Systems  Constitutional: Positive for malaise/fatigue. Negative for diaphoresis, fever and weight loss.  HENT: Negative for ear discharge, ear pain, hearing loss, nosebleeds, sore throat and tinnitus.   Eyes: Negative for blurred vision and pain.  Respiratory: Negative for cough, hemoptysis and wheezing.   Cardiovascular: Negative for chest pain, palpitations, orthopnea and leg swelling.  Gastrointestinal: Negative for abdominal pain, blood in stool, constipation, diarrhea, heartburn, nausea and vomiting.  Genitourinary: Negative for dysuria, frequency and urgency.  Musculoskeletal: Negative for back pain and myalgias.  Skin: Negative for itching and rash.  Neurological: Negative for dizziness, tingling, tremors, focal weakness, seizures, weakness and headaches.  Psychiatric/Behavioral: Negative for depression. The patient is not nervous/anxious.    DRUG ALLERGIES:   Allergies  Allergen Reactions  . Niaspan [Niacin Er] Other (See Comments)    Reaction:  Hot flashes    VITALS:  Blood pressure (!) 125/101, pulse 63, temperature 98.5 F (36.9 C), temperature source Oral, resp. rate 19, height 5\' 11"  (1.803 m), weight 95 kg, SpO2 98 %. PHYSICAL EXAMINATION:  Physical Exam HENT:     Head: Normocephalic and atraumatic.  Eyes:     Conjunctiva/sclera: Conjunctivae normal.     Pupils: Pupils are equal, round, and reactive to light.  Neck:     Thyroid: No thyromegaly.     Trachea: No tracheal deviation.  Cardiovascular:     Rate and Rhythm: Normal rate and regular rhythm.     Heart sounds: Normal heart sounds.  Pulmonary:     Effort: Pulmonary effort is normal. No respiratory distress.      Breath sounds: Normal breath sounds. No wheezing.  Chest:     Chest wall: No tenderness.  Abdominal:     General: Bowel sounds are normal. There is no distension.     Palpations: Abdomen is soft.     Tenderness: There is no abdominal tenderness.  Musculoskeletal:        General: Normal range of motion.     Cervical back: Normal range of motion and neck supple.  Skin:    General: Skin is warm and dry.     Findings: No rash.  Neurological:     Mental Status: He is alert and oriented to person, place, and time.     Cranial Nerves: No cranial nerve deficit.    LABORATORY PANEL:  Male CBC Recent Labs  Lab 12/12/19 0624  WBC 6.6  HGB 11.2*  HCT 35.6*  PLT 182   ------------------------------------------------------------------------------------------------------------------ Chemistries  Recent Labs  Lab 12/12/19 0624 12/12/19 0624 12/13/19 0651  NA 138  --   --   K 3.0*  --   --   CL 103  --   --   CO2 27  --   --   GLUCOSE 110*  --   --   BUN 36*  --   --   CREATININE 1.55*  --   --   CALCIUM 8.2*  --   --   MG 2.1   < > 2.3  AST 15  --   --   ALT 15  --   --   [facility]  149*  --   --   BILITOT 1.3*  --   --    < > = values in this interval not displayed.   RADIOLOGY:  No results found. ASSESSMENT AND PLAN:   Acute on chronic systolic CHF and elevated trop: 2D echo on 09/10/2019 showed EF 20-25%.  Patient has leg edema, elevated BNP, vascular congestion on chest x-ray, clinically consistent with CHF exacerbation.  Patient has a mildly elevated troponin 26.  No chest pain, most likely due to demand ischemia secondary to CHF exacerbation. - Lasix 40 mg bid by IV  -2d echo shows EF of 20-25% -Daily weights -strict I/O's.  -5.8 liters -Low salt diet -Fluid restriction -Obtain REDs Vest reading  COVID-19 virus infection: Patient does not have oxygen desaturation.  No obvious pulmonary infiltration on chest x-ray, but he has multiple comorbidities, he is at high  risk of deteriorating.   Completed remdesivir day 5/5. Pt received 1 dose of ivermectin 16109 mcg in ED, no further need -vitamin C, zinc.  -Bronchodilators -PRN Mucinex for cough -Monitor inflammatory markers daily - He is not requiring any oxygen.  He is on room air now  Gout -Allopurinol  History of TIA (transient ischemic attack) -Aspirin  CAD (coronary artery disease): No chest pain,  troponins trending down - Imdur -On aspirin,  Coreg  HTN (hypertension) -Coreg -IV hydralazine as needed  HLD (hyperlipidemia): Patient is not taking statin -LDL 21.  Hold off statin at this time  Atrial fibrillation [facility]): -Continue Coreg and amiodarone  CKD (chronic kidney disease), stage IIIa: Stable   Pressure Injury 09/12/19 Coccyx Medial Stage 1 -  Intact skin with non-blanchable redness of a localized area usually over a bony prominence. non-blanchable redness to sacral area (Active)  09/12/19 2155  Location: Coccyx  Location Orientation: Medial  Staging: Stage 1 -  Intact skin with non-blanchable redness of a localized area usually over a bony prominence.  Wound Description (Comments): non-blanchable redness to sacral area  Present on Admission: No   TOC team is aware of him needing SNF.  They are working on Curator for [employer]. patient is medically stable for discharge as he completes his fifth day of IV remdesivir today on 6/12  Status is: Inpatient  Remains inpatient appropriate because:IV treatments appropriate due to intensity of illness or inability to take PO   Dispo: The patient is from: SNF              Anticipated d/c is to: SNF              Anticipated d/c date is: 1 day              Patient currently is medically stable to d/c.  Still waiting for insurance authorization.  TOC team is aware.   DVT prophylaxis: enoxaparin (LOVENOX) injection 40 mg Start: 12/07/19 2200  Family Communication: Discussed with patient   All the  records are reviewed and case discussed with Care Management/Social Worker. Management plans discussed with the patient, nursing and they are in agreement.  CODE STATUS: DNR  TOTAL TIME TAKING CARE OF THIS PATIENT: 35 minutes.   More than 50% of the time was spent in counseling/coordination of care: YES  POSSIBLE D/C IN 1 DAYS, DEPENDING ON CLINICAL CONDITION.   Delfino Lovett M.D on 12/13/2019 at 12:13 PM  [facility]   CC: Primary care physician; [name], MD  Note: This dictation was prepared with Dragon dictation along with smaller  Company secretary. Any transcriptional errors that result from this process are unintentional.

## 2019-12-13 NOTE — Progress Notes (Signed)
Pt arrived from transferring unit in stable condition. Denies any unmet needs at present time. Lunch tray set up.

## 2019-12-14 DIAGNOSIS — R0989 Other specified symptoms and signs involving the circulatory and respiratory systems: Secondary | ICD-10-CM | POA: Diagnosis not present

## 2019-12-14 DIAGNOSIS — I509 Heart failure, unspecified: Secondary | ICD-10-CM | POA: Diagnosis not present

## 2019-12-14 DIAGNOSIS — U071 COVID-19: Secondary | ICD-10-CM | POA: Diagnosis not present

## 2019-12-14 DIAGNOSIS — R609 Edema, unspecified: Secondary | ICD-10-CM | POA: Diagnosis not present

## 2019-12-14 DIAGNOSIS — I480 Paroxysmal atrial fibrillation: Secondary | ICD-10-CM

## 2019-12-14 DIAGNOSIS — R05 Cough: Secondary | ICD-10-CM | POA: Diagnosis not present

## 2019-12-14 DIAGNOSIS — M6281 Muscle weakness (generalized): Secondary | ICD-10-CM | POA: Diagnosis not present

## 2019-12-14 DIAGNOSIS — Z20822 Contact with and (suspected) exposure to covid-19: Secondary | ICD-10-CM | POA: Diagnosis not present

## 2019-12-14 DIAGNOSIS — J90 Pleural effusion, not elsewhere classified: Secondary | ICD-10-CM | POA: Diagnosis not present

## 2019-12-14 DIAGNOSIS — D649 Anemia, unspecified: Secondary | ICD-10-CM | POA: Diagnosis not present

## 2019-12-14 DIAGNOSIS — R2689 Other abnormalities of gait and mobility: Secondary | ICD-10-CM | POA: Diagnosis not present

## 2019-12-14 DIAGNOSIS — Z743 Need for continuous supervision: Secondary | ICD-10-CM | POA: Diagnosis not present

## 2019-12-14 DIAGNOSIS — R2681 Unsteadiness on feet: Secondary | ICD-10-CM | POA: Diagnosis not present

## 2019-12-14 DIAGNOSIS — I5189 Other ill-defined heart diseases: Secondary | ICD-10-CM | POA: Diagnosis not present

## 2019-12-14 DIAGNOSIS — J188 Other pneumonia, unspecified organism: Secondary | ICD-10-CM | POA: Diagnosis not present

## 2019-12-14 DIAGNOSIS — I5023 Acute on chronic systolic (congestive) heart failure: Secondary | ICD-10-CM | POA: Diagnosis not present

## 2019-12-14 DIAGNOSIS — Z7401 Bed confinement status: Secondary | ICD-10-CM | POA: Diagnosis not present

## 2019-12-14 DIAGNOSIS — R5381 Other malaise: Secondary | ICD-10-CM | POA: Diagnosis not present

## 2019-12-14 DIAGNOSIS — N178 Other acute kidney failure: Secondary | ICD-10-CM | POA: Diagnosis not present

## 2019-12-14 DIAGNOSIS — M109 Gout, unspecified: Secondary | ICD-10-CM | POA: Diagnosis not present

## 2019-12-14 DIAGNOSIS — I10 Essential (primary) hypertension: Secondary | ICD-10-CM | POA: Diagnosis not present

## 2019-12-14 DIAGNOSIS — I517 Cardiomegaly: Secondary | ICD-10-CM | POA: Diagnosis not present

## 2019-12-14 DIAGNOSIS — N1831 Chronic kidney disease, stage 3a: Secondary | ICD-10-CM | POA: Diagnosis not present

## 2019-12-14 DIAGNOSIS — G4709 Other insomnia: Secondary | ICD-10-CM | POA: Diagnosis not present

## 2019-12-14 DIAGNOSIS — R0602 Shortness of breath: Secondary | ICD-10-CM | POA: Diagnosis not present

## 2019-12-14 DIAGNOSIS — J189 Pneumonia, unspecified organism: Secondary | ICD-10-CM | POA: Diagnosis not present

## 2019-12-14 DIAGNOSIS — M2550 Pain in unspecified joint: Secondary | ICD-10-CM | POA: Diagnosis not present

## 2019-12-14 DIAGNOSIS — I248 Other forms of acute ischemic heart disease: Secondary | ICD-10-CM | POA: Diagnosis not present

## 2019-12-14 LAB — BASIC METABOLIC PANEL
Anion gap: 10 (ref 5–15)
BUN: 31 mg/dL — ABNORMAL HIGH (ref 8–23)
CO2: 25 mmol/L (ref 22–32)
Calcium: 8.1 mg/dL — ABNORMAL LOW (ref 8.9–10.3)
Chloride: 102 mmol/L (ref 98–111)
Creatinine, Ser: 1.44 mg/dL — ABNORMAL HIGH (ref 0.61–1.24)
GFR calc Af Amer: 54 mL/min — ABNORMAL LOW (ref >60)
GFR calc non Af Amer: 47 mL/min — ABNORMAL LOW (ref >60)
Glucose, Bld: 125 mg/dL — ABNORMAL HIGH (ref 70–99)
Potassium: 3 mmol/L — ABNORMAL LOW (ref 3.5–5.1)
Sodium: 137 mmol/L (ref 135–145)

## 2019-12-14 LAB — SARS CORONAVIRUS 2 BY RT PCR (HOSPITAL ORDER, PERFORMED IN ~~LOC~~ HOSPITAL LAB): SARS Coronavirus 2: POSITIVE — AB

## 2019-12-14 LAB — MAGNESIUM: Magnesium: 2.2 mg/dL (ref 1.7–2.4)

## 2019-12-14 MED ORDER — ZINC SULFATE 220 (50 ZN) MG PO CAPS: 220.0000 mg | ORAL_CAPSULE | Freq: Every day | ORAL | 0 refills | Status: DC

## 2019-12-14 MED ORDER — ASCORBIC ACID 500 MG PO TABS: 500.0000 mg | ORAL_TABLET | Freq: Every day | ORAL | 0 refills | Status: DC

## 2019-12-14 NOTE — TOC Progression Note (Addendum)
Transition of Care [facility]) - Progression Note    Patient Details  Name: Larry Lawrence MRN: [id number] Date of Birth: August 17, 1940  Transition [facility]) CM/SW Contact  Barrie Dunker, RN Phone Number: 12/14/2019, 11:17 AM  Clinical Narrative:Recieved a call from [facility] offering a Covid Pos bed   I called the Daughter Tammy and discussed the new bed offer, she is in agreeance and will call the grandson that the patient lives with and let him know I called [employer] to get the insurance auth approval switched, new ref number is [id number], I notified Dannielle Karvonen of the new ref number The patient will be able to DC today,    Expected Discharge Plan: Skilled Nursing Facility Barriers to Discharge: Barriers Resolved  Expected Discharge Plan and Services Expected Discharge Plan: Skilled Nursing Facility In-house Referral: Clinical Social Work   Post Acute Care Choice: Skilled Nursing Facility Living arrangements for the past 2 months: Single Family Home Expected Discharge Date: 12/14/19                                     Social Determinants of Health (SDOH) Interventions    Readmission Risk Interventions No flowsheet data found.

## 2019-12-14 NOTE — TOC Progression Note (Signed)
Transition of Care [facility]) - Progression Note    Patient Details  Name: Larry Lawrence MRN: [id number] Date of Birth: Apr 13, 1941  Transition of Care [facility]) CM/SW Contact  Barrie Dunker, RN Phone Number: 12/14/2019, 9:07 AM  Clinical Narrative:    Received notice of Authorization approval from [facility], Next review date 6/16 [id number], ref number [id number] I notified [name] with [facility] and called the daughter [name] via phone call at [phone] The patient has not had covid vaccine   Expected Discharge Plan: Skilled Nursing Facility Barriers to Discharge: Continued Medical Work up  Expected Discharge Plan and Services Expected Discharge Plan: Skilled Nursing Facility In-house Referral: Clinical Social Work   Post Acute Care Choice: Skilled Nursing Facility Living arrangements for the past 2 months: Single Family Home                                       Social Determinants of Health (SDOH) Interventions    Readmission Risk Interventions No flowsheet data found.

## 2019-12-14 NOTE — TOC Progression Note (Signed)
Transition of Care [facility]) - Progression Note    Patient Details  Name: Larry Lawrence MRN: [id number] Date of Birth: [birth year]  Transition of Care [facility]) CM/SW Contact  Barrie Dunker, RN Phone Number: 12/14/2019, 10:16 AM  Clinical Narrative:    Received a call from [name] at [facility], they are unable to accept the patient with a COvid Positive test on June 8th, He was reswabbed today but even if that is negative they are unable to accept due to Positive test previously, I attempted to contact the daughter [name] and let her know we are redoing the bedsearch to find a facility to take the patient with the positive covid, Unable to reach, will try again, I notified the Doctor and the bedside nurse of this also, Will need to call and get insurance auth changed to a new facility once one is found. I called and left a VM for [place] at [facility] and Dannielle Karvonen at [facility] to see if they would offer a bed awaiting a call back, I sent the bed search thru the Hub as well   Expected Discharge Plan: Skilled Nursing Facility Barriers to Discharge: Barriers Resolved  Expected Discharge Plan and Services Expected Discharge Plan: Skilled Nursing Facility In-house Referral: Clinical Social Work   Post Acute Care Choice: Skilled Nursing Facility Living arrangements for the past 2 months: Single Family Home Expected Discharge Date: 12/14/19                                     Social Determinants of Health (SDOH) Interventions    Readmission Risk Interventions No flowsheet data found.

## 2019-12-14 NOTE — TOC Transition Note (Signed)
Transition of Care [facility]) - CM/SW Discharge Note   Patient Details  Name: ADONI GREENOUGH MRN: [id number] Date of Birth: 06-Mar-1941  Transition of Care [facility]) CM/SW Contact:  Barrie Dunker, RN Phone Number: 12/14/2019, 12:18 PM   Clinical Narrative:    Patient to DC to [facility] in [place] room 808P, the patient is Covid pos The auth approval ref number [id number] I provided this number to tiffiany  The bedside nurse was provied the number to call report to [facility] She is ready for EMS, I called [facility] and arranged for Transport    Final next level of care: Skilled Nursing Facility Barriers to Discharge: Barriers Resolved   Patient Goals and CMS Choice Patient states their goals for this hospitalization and ongoing recovery are:: for pt to get better   Choice offered to / list presented to : Adult Children  Discharge Placement              Patient chooses bed at: [facility] Patient to be transferred to facility by: [facility] Name of family member notified: TAmmy Patient and family notified of of transfer: 12/14/19  Discharge Plan and Services In-house Referral: Clinical Social Work   Post Acute Care Choice: Skilled Nursing Facility                               Social Determinants of Health (SDOH) Interventions     Readmission Risk Interventions No flowsheet data found.

## 2019-12-14 NOTE — Discharge Instructions (Signed)
COVID-19 COVID-19 is a respiratory infection that is caused by a virus called severe acute respiratory syndrome coronavirus 2 (SARS-CoV-2). The disease is also known as coronavirus disease or novel coronavirus. In some people, the virus may not cause any symptoms. In others, it may cause a serious infection. The infection can get worse quickly and can lead to complications, such as:  Pneumonia, or infection of the lungs.  Acute respiratory distress syndrome or ARDS. This is a condition in which fluid build-up in the lungs prevents the lungs from filling with air and passing oxygen into the blood.  Acute respiratory failure. This is a condition in which there is not enough oxygen passing from the lungs to the body or when carbon dioxide is not passing from the lungs out of the body.  Sepsis or septic shock. This is a serious bodily reaction to an infection.  Blood clotting problems.  Secondary infections due to bacteria or fungus.  Organ failure. This is when your body's organs stop working. The virus that causes COVID-19 is contagious. This means that it can spread from person to person through droplets from coughs and sneezes (respiratory secretions). What are the causes? This illness is caused by a virus. You may catch the virus by:  Breathing in droplets from an infected person. Droplets can be spread by a person breathing, speaking, singing, coughing, or sneezing.  Touching something, like a table or a doorknob, that was exposed to the virus (contaminated) and then touching your mouth, nose, or eyes. What increases the risk? Risk for infection You are more likely to be infected with this virus if you:  Are within 6 feet (2 meters) of a person with COVID-19.  Provide care for or live with a person who is infected with COVID-19.  Spend time in crowded indoor spaces or live in shared housing. Risk for serious illness You are more likely to become seriously ill from the virus if  you:  Are 50 years of age or older. The higher your age, the more you are at risk for serious illness.  Live in a nursing home or long-term care facility.  Have cancer.  Have a long-term (chronic) disease such as: ? Chronic lung disease, including chronic obstructive pulmonary disease or asthma. ? A long-term disease that lowers your body's ability to fight infection (immunocompromised). ? Heart disease, including heart failure, a condition in which the arteries that lead to the heart become narrow or blocked (coronary artery disease), a disease which makes the heart muscle thick, weak, or stiff (cardiomyopathy). ? Diabetes. ? Chronic kidney disease. ? Sickle cell disease, a condition in which red blood cells have an abnormal "sickle" shape. ? Liver disease.  Are obese. What are the signs or symptoms? Symptoms of this condition can range from mild to severe. Symptoms may appear any time from 2 to 14 days after being exposed to the virus. They include:  A fever or chills.  A cough.  Difficulty breathing.  Headaches, body aches, or muscle aches.  Runny or stuffy (congested) nose.  A sore throat.  New loss of taste or smell. Some people may also have stomach problems, such as nausea, vomiting, or diarrhea. Other people may not have any symptoms of COVID-19. How is this diagnosed? This condition may be diagnosed based on:  Your signs and symptoms, especially if: ? You live in an area with a COVID-19 outbreak. ? You recently traveled to or from an area where the virus is common. ? You   provide care for or live with a person who was diagnosed with COVID-19. ? You were exposed to a person who was diagnosed with COVID-19.  A physical exam.  Lab tests, which may include: ? Taking a sample of fluid from the back of your nose and throat (nasopharyngeal fluid), your nose, or your throat using a swab. ? A sample of mucus from your lungs (sputum). ? Blood tests.  Imaging tests,  which may include, X-rays, CT scan, or ultrasound. How is this treated? At present, there is no medicine to treat COVID-19. Medicines that treat other diseases are being used on a trial basis to see if they are effective against COVID-19. Your health care provider will talk with you about ways to treat your symptoms. For most people, the infection is mild and can be managed at home with rest, fluids, and over-the-counter medicines. Treatment for a serious infection usually takes places in a hospital intensive care unit (ICU). It may include one or more of the following treatments. These treatments are given until your symptoms improve.  Receiving fluids and medicines through an IV.  Supplemental oxygen. Extra oxygen is given through a tube in the nose, a face mask, or a hood.  Positioning you to lie on your stomach (prone position). This makes it easier for oxygen to get into the lungs.  Continuous positive airway pressure (CPAP) or bi-level positive airway pressure (BPAP) machine. This treatment uses mild air pressure to keep the airways open. A tube that is connected to a motor delivers oxygen to the body.  Ventilator. This treatment moves air into and out of the lungs by using a tube that is placed in your windpipe.  Tracheostomy. This is a procedure to create a hole in the neck so that a breathing tube can be inserted.  Extracorporeal membrane oxygenation (ECMO). This procedure gives the lungs a chance to recover by taking over the functions of the heart and lungs. It supplies oxygen to the body and removes carbon dioxide. Follow these instructions at home: Lifestyle  If you are sick, stay home except to get medical care. Your health care provider will tell you how long to stay home. Call your health care provider before you go for medical care.  Rest at home as told by your health care provider.  Do not use any products that contain nicotine or tobacco, such as cigarettes,  e-cigarettes, and chewing tobacco. If you need help quitting, ask your health care provider.  Return to your normal activities as told by your health care provider. Ask your health care provider what activities are safe for you. General instructions  Take over-the-counter and prescription medicines only as told by your health care provider.  Drink enough fluid to keep your urine pale yellow.  Keep all follow-up visits as told by your health care provider. This is important. How is this prevented?  There is no vaccine to help prevent COVID-19 infection. However, there are steps you can take to protect yourself and others from this virus. To protect yourself:   Do not travel to areas where COVID-19 is a risk. The areas where COVID-19 is reported change often. To identify high-risk areas and travel restrictions, check the CDC travel website: wwwnc.cdc.gov/travel/notices  If you live in, or must travel to, an area where COVID-19 is a risk, take precautions to avoid infection. ? Stay away from people who are sick. ? Wash your hands often with soap and water for 20 seconds. If soap and water   are not available, use an alcohol-based hand sanitizer. ? Avoid touching your mouth, face, eyes, or nose. ? Avoid going out in public, follow guidance from your state and local health authorities. ? If you must go out in public, wear a cloth face covering or face mask. Make sure your mask covers your nose and mouth. ? Avoid crowded indoor spaces. Stay at least 6 feet (2 meters) away from others. ? Disinfect objects and surfaces that are frequently touched every day. This may include:  Counters and tables.  Doorknobs and light switches.  Sinks and faucets.  Electronics, such as phones, remote controls, keyboards, computers, and tablets. To protect others: If you have symptoms of COVID-19, take steps to prevent the virus from spreading to others.  If you think you have a COVID-19 infection, contact  your health care provider right away. Tell your health care team that you think you may have a COVID-19 infection.  Stay home. Leave your house only to seek medical care. Do not use public transport.  Do not travel while you are sick.  Wash your hands often with soap and water for 20 seconds. If soap and water are not available, use alcohol-based hand sanitizer.  Stay away from other members of your household. Let healthy household members care for children and pets, if possible. If you have to care for children or pets, wash your hands often and wear a mask. If possible, stay in your own room, separate from others. Use a different bathroom.  Make sure that all people in your household wash their hands well and often.  Cough or sneeze into a tissue or your sleeve or elbow. Do not cough or sneeze into your hand or into the air.  Wear a cloth face covering or face mask. Make sure your mask covers your nose and mouth. Where to find more information  Centers for Disease Control and Prevention: www.cdc.gov/coronavirus/2019-ncov/index.html  World Health Organization: www.who.int/health-topics/coronavirus Contact a health care provider if:  You live in or have traveled to an area where COVID-19 is a risk and you have symptoms of the infection.  You have had contact with someone who has COVID-19 and you have symptoms of the infection. Get help right away if:  You have trouble breathing.  You have pain or pressure in your chest.  You have confusion.  You have bluish lips and fingernails.  You have difficulty waking from sleep.  You have symptoms that get worse. These symptoms may represent a serious problem that is an emergency. Do not wait to see if the symptoms will go away. Get medical help right away. Call your local emergency services (911 in the [place] Do not drive yourself to the hospital. Let the emergency medical personnel know if you think you have  COVID-19. Summary  COVID-19 is a respiratory infection that is caused by a virus. It is also known as coronavirus disease or novel coronavirus. It can cause serious infections, such as pneumonia, acute respiratory distress syndrome, acute respiratory failure, or sepsis.  The virus that causes COVID-19 is contagious. This means that it can spread from person to person through droplets from breathing, speaking, singing, coughing, or sneezing.  You are more likely to develop a serious illness if you are 50 years of age or older, have a weak immune system, live in a nursing home, or have chronic disease.  There is no medicine to treat COVID-19. Your health care provider will talk with you about ways to treat your symptoms.    Take steps to protect yourself and others from infection. Wash your hands often and disinfect objects and surfaces that are frequently touched every day. Stay away from people who are sick and wear a mask if you are sick. This information is not intended to replace advice given to you by your health care provider. Make sure you discuss any questions you have with your health care provider. Document Revised: 04/16/2019 Document Reviewed: 07/23/2018 Elsevier Patient Education  2020 [place].  COVID-19: How to Protect Yourself and Others Know how it spreads  There is currently no vaccine to prevent coronavirus disease 2019 (COVID-19).  The best way to prevent illness is to avoid being exposed to this virus.  The virus is thought to spread mainly from person-to-person. ? Between people who are in close contact with one another (within about 6 feet). ? Through respiratory droplets produced when an infected person coughs, sneezes or talks. ? These droplets can land in the mouths or noses of people who are nearby or possibly be inhaled into the lungs. ? COVID-19 may be spread by people who are not showing symptoms. Everyone should Clean your hands often  Wash your hands  often with soap and water for at least 20 seconds especially after you have been in a public place, or after blowing your nose, coughing, or sneezing.  If soap and water are not readily available, use a hand sanitizer that contains at least 60% alcohol. Cover all surfaces of your hands and rub them together until they feel dry.  Avoid touching your eyes, nose, and mouth with unwashed hands. Avoid close contact  Limit contact with others as much as possible.  Avoid close contact with people who are sick.  Put distance between yourself and other people. ? Remember that some people without symptoms may be able to spread virus. ? This is especially important for people who are at higher risk of getting very GainPain.com.cy Cover your mouth and nose with a mask when around others  You could spread COVID-19 to others even if you do not feel sick.  Everyone should wear a mask in public settings and when around people not living in their household, especially when social distancing is difficult to maintain. ? Masks should not be placed on young children under age 92, anyone who has trouble breathing, or is unconscious, incapacitated or otherwise unable to remove the mask without assistance.  The mask is meant to protect other people in case you are infected.  Do NOT use a facemask meant for a Dietitian.  Continue to keep about 6 feet between yourself and others. The mask is not a substitute for social distancing. Cover coughs and sneezes  Always cover your mouth and nose with a tissue when you cough or sneeze or use the inside of your elbow.  Throw used tissues in the trash.  Immediately wash your hands with soap and water for at least 20 seconds. If soap and water are not readily available, clean your hands with a hand sanitizer that contains at least 60% alcohol. Clean and disinfect  Clean AND disinfect  frequently touched surfaces daily. This includes tables, doorknobs, light switches, countertops, handles, desks, phones, keyboards, toilets, faucets, and sinks. RackRewards.fr  If surfaces are dirty, clean them: Use detergent or soap and water prior to disinfection.  Then, use a household disinfectant. You can see a list of EPA-registered household disinfectants here. [email or web address].[email or web address] 03/03/2019 This information is not intended to replace advice given to you by  your health care provider. Make sure you discuss any questions you have with your health care provider. Document Revised: 03/11/2019 Document Reviewed: 01/07/2019 Elsevier Patient Education  [place].   COVID-19 Frequently Asked Questions COVID-19 (coronavirus disease) is an infection that is caused by a large family of viruses. Some viruses cause illness in people and others cause illness in animals like camels, cats, and bats. In some cases, the viruses that cause illness in animals can spread to humans. Where did the coronavirus come from? In December 2019, [place] told the [facility]) of several cases of lung disease (human respiratory illness). These cases were linked to an open seafood and livestock market in the city of [place]. The link to the seafood and livestock market suggests that the virus may have spread from animals to humans. However, since that first outbreak in December, the virus has also been shown to spread from person to person. What is the name of the disease and the virus? Disease name Early on, this disease was called novel coronavirus. This is because scientists determined that the disease was caused by a new (novel) respiratory virus. The World Health Organization [facility]) has now named the disease COVID-19, or coronavirus disease. Virus name The virus that causes the disease is called severe acute respiratory syndrome  coronavirus 2 (SARS-CoV-2). More information on disease and virus naming World Health Organization [facility]): www.who.int/emergencies/diseases/novel-coronavirus-2019/technical-guidance/naming-the-coronavirus-disease-(covid-2019)-and-the-virus-that-causes-it Who is at risk for complications from coronavirus disease? Some people may be at higher risk for complications from coronavirus disease. This includes older adults and people who have chronic diseases, such as heart disease, diabetes, and lung disease. If you are at higher risk for complications, take these extra precautions:  Stay home as much as possible.  Avoid social gatherings and travel.  Avoid close contact with others. Stay at least 6 ft (2 m) away from others, if possible.  Wash your hands often with soap and water for at least 20 seconds.  Avoid touching your face, mouth, nose, or eyes.  Keep supplies on hand at home, such as food, medicine, and cleaning supplies.  If you must go out in public, wear a cloth face covering or face mask. Make sure your mask covers your nose and mouth. How does coronavirus disease spread? The virus that causes coronavirus disease spreads easily from person to person (is contagious). You may catch the virus by:  Breathing in droplets from an infected person. Droplets can be spread by a person breathing, speaking, singing, coughing, or sneezing.  Touching something, like a table or a doorknob, that was exposed to the virus (contaminated) and then touching your mouth, nose, or eyes. Can I get the virus from touching surfaces or objects? There is still a lot that we do not know about the virus that causes coronavirus disease. Scientists are basing a lot of information on what they know about similar viruses, such as:  Viruses cannot generally survive on surfaces for long. They need a human body (host) to survive.  It is more likely that the virus is spread by close contact with people who are sick  (direct contact), such as through: ? Shaking hands or hugging. ? Breathing in respiratory droplets that travel through the air. Droplets can be spread by a person breathing, speaking, singing, coughing, or sneezing.  It is less likely that the virus is spread when a person touches a surface or object that has the virus on it (indirect contact). The virus may be able to enter  the body if the person touches a surface or object and then touches his or her face, eyes, nose, or mouth. Can a person spread the virus without having symptoms of the disease? It may be possible for the virus to spread before a person has symptoms of the disease, but this is most likely not the main way the virus is spreading. It is more likely for the virus to spread by being in close contact with people who are sick and breathing in the respiratory droplets spread by a person breathing, speaking, singing, coughing, or sneezing. What are the symptoms of coronavirus disease? Symptoms vary from person to person and can range from mild to severe. Symptoms may include:  Fever or chills.  Cough.  Difficulty breathing or feeling short of breath.  Headaches, body aches, or muscle aches.  Runny or stuffy (congested) nose.  Sore throat.  New loss of taste or smell.  Nausea, vomiting, or diarrhea. These symptoms can appear anywhere from 2 to 14 days after you have been exposed to the virus. Some people may not have any symptoms. If you develop symptoms, call your health care provider. People with severe symptoms may need hospital care. Should I be tested for this virus? Your health care provider will decide whether to test you based on your symptoms, history of exposure, and your risk factors. How does a health care provider test for this virus? Health care providers will collect samples to send for testing. Samples may include:  Taking a swab of fluid from the back of your nose and throat, your nose, or your  throat.  Taking fluid from the lungs by having you cough up mucus (sputum) into a sterile cup.  Taking a blood sample. Is there a treatment or vaccine for this virus? Currently, there is no vaccine to prevent coronavirus disease. Also, there are no medicines like antibiotics or antivirals to treat the virus. A person who becomes sick is given supportive care, which means rest and fluids. A person may also relieve his or her symptoms by using over-the-counter medicines that treat sneezing, coughing, and runny nose. These are the same medicines that a person takes for the common cold. If you develop symptoms, call your health care provider. People with severe symptoms may need hospital care. What can I do to protect myself and my family from this virus?     You can protect yourself and your family by taking the same actions that you would take to prevent the spread of other viruses. Take the following actions:  Wash your hands often with soap and water for at least 20 seconds. If soap and water are not available, use alcohol-based hand sanitizer.  Avoid touching your face, mouth, nose, or eyes.  Cough or sneeze into a tissue, sleeve, or elbow. Do not cough or sneeze into your hand or the air. ? If you cough or sneeze into a tissue, throw it away immediately and wash your hands.  Disinfect objects and surfaces that you frequently touch every day.  Stay away from people who are sick.  Avoid going out in public, follow guidance from your state and local health authorities.  Avoid crowded indoor spaces. Stay at least 6 ft (2 m) away from others.  If you must go out in public, wear a cloth face covering or face mask. Make sure your mask covers your nose and mouth.  Stay home if you are sick, except to get medical care. Call your health care provider  before you get medical care. Your health care provider will tell you how long to stay home.  Make sure your vaccines are up to date. Ask your  health care provider what vaccines you need. What should I do if I need to travel? Follow travel recommendations from your local health authority, the CDC, and WHO. Travel information and advice  Centers for Disease Control and Prevention (CDC): BodyEditor.hu  World Health Organization [facility]): ThirdIncome.ca Know the risks and take action to protect your health  You are at higher risk of getting coronavirus disease if you are traveling to areas with an outbreak or if you are exposed to travelers from areas with an outbreak.  Wash your hands often and practice good hygiene to lower the risk of catching or spreading the virus. What should I do if I am sick? General instructions to stop the spread of infection  Wash your hands often with soap and water for at least 20 seconds. If soap and water are not available, use alcohol-based hand sanitizer.  Cough or sneeze into a tissue, sleeve, or elbow. Do not cough or sneeze into your hand or the air.  If you cough or sneeze into a tissue, throw it away immediately and wash your hands.  Stay home unless you must get medical care. Call your health care provider or local health authority before you get medical care.  Avoid public areas. Do not take public transportation, if possible.  If you can, wear a mask if you must go out of the house or if you are in close contact with someone who is not sick. Make sure your mask covers your nose and mouth. Keep your home clean  Disinfect objects and surfaces that are frequently touched every day. This may include: ? Counters and tables. ? Doorknobs and light switches. ? Sinks and faucets. ? Electronics such as phones, remote controls, keyboards, computers, and tablets.  Wash dishes in hot, soapy water or use a dishwasher. Air-dry your dishes.  Wash laundry in hot water. Prevent infecting other household  members  Let healthy household members care for children and pets, if possible. If you have to care for children or pets, wash your hands often and wear a mask.  Sleep in a different bedroom or bed, if possible.  Do not share personal items, such as razors, toothbrushes, deodorant, combs, brushes, towels, and washcloths. Where to find more information Centers for Disease Control and Prevention (CDC)  Information and news updates: https://www.butler-gonzalez.com/ World Health Organization [facility])  Information and news updates: MissExecutive.com.ee  Coronavirus health topic: https://www.castaneda.info/  Questions and answers on COVID-19: OpportunityDebt.at  Global tracker: who.sprinklr.com American Academy of Pediatrics (AAP)  Information for families: www.healthychildren.org/English/health-issues/conditions/chest-lungs/Pages/2019-Novel-Coronavirus.aspx The coronavirus situation is changing rapidly. Check your local health authority website or the CDC and [facility] websites for updates and news. When should I contact a health care provider?  Contact your health care provider if you have symptoms of an infection, such as fever or cough, and you: ? Have been near anyone who is known to have coronavirus disease. ? Have come into contact with a person who is suspected to have coronavirus disease. ? Have traveled to an area where there is an outbreak of COVID-19. When should I get emergency medical care?  Get help right away by calling your local emergency services (911 in the [place] if you have: ? Trouble breathing. ? Pain or pressure in your chest. ? Confusion. ? Blue-tinged lips and fingernails. ? Difficulty waking from sleep. ?  Symptoms that get worse. Let the emergency medical personnel know if you think you have coronavirus disease. Summary  A new respiratory virus is spreading from person to person and  causing COVID-19 (coronavirus disease).  The virus that causes COVID-19 appears to spread easily. It spreads from one person to another through droplets from breathing, speaking, singing, coughing, or sneezing.  Older adults and those with chronic diseases are at higher risk of disease. If you are at higher risk for complications, take extra precautions.  There is currently no vaccine to prevent coronavirus disease. There are no medicines, such as antibiotics or antivirals, to treat the virus.  You can protect yourself and your family by washing your hands often, avoiding touching your face, and covering your coughs and sneezes. This information is not intended to replace advice given to you by your health care provider. Make sure you discuss any questions you have with your health care provider. Document Revised: 04/16/2019 Document Reviewed: 10/13/2018 Elsevier Patient Education  2020 Elsevier Inc.  COVID-19: Quarantine vs. Isolation QUARANTINE keeps someone who was in close contact with someone who has COVID-19 away from others. If you had close contact with a person who has COVID-19  Stay home until 14 days after your last contact.  Check your temperature twice a day and watch for symptoms of COVID-19.  If possible, stay away from people who are at higher-risk for getting very sick from COVID-19. ISOLATION keeps someone who is sick or tested positive for COVID-19 without symptoms away from others, even in their own home. If you are sick and think or know you have COVID-19  Stay home until after ? At least 10 days since symptoms first appeared and ? At least 24 hours with no fever without fever-reducing medication and ? Symptoms have improved If you tested positive for COVID-19 but do not have symptoms  Stay home until after ? 10 days have passed since your positive test If you live with others, stay in a specific "sick room" or area and away from other people or animals,  including pets. Use a separate bathroom, if available. [email or web address] 01/18/2019 This information is not intended to replace advice given to you by your health care provider. Make sure you discuss any questions you have with your health care provider. Document Revised: 06/03/2019 Document Reviewed: 06/03/2019 Elsevier Patient Education  2020 [employer].

## 2019-12-14 NOTE — TOC Progression Note (Signed)
Transition of Care [facility]) - Progression Note    Patient Details  Name: Larry Lawrence MRN: [id number] Date of Birth: [birth year]  Transition of [facility]) CM/SW Contact  Barrie Dunker, RN Phone Number: 12/14/2019, 10:23 AM  Clinical Narrative:     Spoke with the patient's daughter Larry Lawrence and she understands that [facility] is unable to accept the patient due to [facility] Covid test on 6/8, I will call her once we get other bed offers  Expected Discharge Plan: Skilled Nursing Facility Barriers to Discharge: Barriers Resolved  Expected Discharge Plan and Services Expected Discharge Plan: Skilled Nursing Facility In-house Referral: Clinical Social Work   Post Acute Care Choice: Skilled Nursing Facility Living arrangements for the past 2 months: Single Family Home Expected Discharge Date: 12/14/19                                     Social Determinants of Health (SDOH) Interventions    Readmission Risk Interventions No flowsheet data found.

## 2019-12-14 NOTE — Progress Notes (Signed)
Attempts to call report to nursing staff at [facility] made at the following times: 1225, 1230, 1249, 1253, 1317. No answer at nurses station or DON office. Message left on DON VM

## 2019-12-14 NOTE — Discharge Summary (Signed)
[place] at [facility]   PATIENT NAME: Larry Lawrence    MR#:  [id number]  DATE OF BIRTH:  [birth year]  DATE OF ADMISSION:  12/07/2019   ADMITTING PHYSICIAN: Lorretta Harp, MD  DATE OF DISCHARGE: No discharge date for patient encounter.  PRIMARY CARE PHYSICIAN: [name], MD   ADMISSION DIAGNOSIS:  Weakness [R53.1] Acute on chronic systolic CHF (congestive heart failure) (HCC) [I50.23] Acute on chronic systolic (congestive) heart failure (HCC) [I50.23] COVID-19 [U07.1] DISCHARGE DIAGNOSIS:  Principal Problem:   Acute on chronic systolic CHF (congestive heart failure) (HCC) Active Problems:   Elevated troponin   Gout   TIA (transient ischemic attack)   CAD (coronary artery disease)   HTN (hypertension)   HLD (hyperlipidemia)   Atrial fibrillation (HCC)   CKD (chronic kidney disease), stage III   COVID-19 virus infection   Acute on chronic systolic (congestive) heart failure (HCC)   Goals of care, counseling/discussion   Palliative care by specialist  SECONDARY DIAGNOSIS:   Past Medical History:  Diagnosis Date  . Allergy   . CHF (congestive heart failure) (HCC)   . Coronary artery disease   . Gout   . Insomnia   . Renal disorder   . Sleeps in sitting position due to orthopnea   . TIA (transient ischemic attack)    HOSPITAL COURSE:  Larry Lawrence is a 79 y.o. male with medical history significant of hypertension, hyperlipidemia, TIA, gout, CHF with EF 20--25%, CAD, CKD stage III, atrial fibrillation not on anticoagulant, V. tach, gout, who presents with shortness of breath and generalized weakness admitted for Covid 19 infection and CHF exacerbation.  Acute on chronic systolic CHFand elevated trop:2D echo on 09/10/2019 showed EF 20-25%.Patient had leg edema, elevated BNP, vascular congestion on chest x-ray, clinically consistent with CHF exacerbation.Patient has a mildly elevated troponin 26. No chest pain, most likely due to demand ischemia  secondary to CHF exacerbation. -Diuresed well with IV Lasix.  -7.8 L of fluid balance -2d echo shows EF of 20-25%.  COVID-19 virus infection: Completed remdesivir day 5/5. Ptreceived 1 dose of ivermectin 98921 mcg in ED, remains on room air.  Being discharged on vitamin C, zinc.  Gout - Allopurinol  History of TIA (transient ischemic attack) - Aspirin  CAD (coronary artery disease):No chest pain,continue Imdur, aspirin, Coreg  HTN (hypertension) -Coreg  HLD (hyperlipidemia):Patient is not taking statin, LDL 21.  Hold off statin at this time  Paroxysmal atrial fibrillation [facility]): Continue Coreg and amiodarone, not on anticoagulation by his primary cardiologist.  Will defer decision to them.  Follow as an outpatient with Dr. Gwen Pounds.  CKD (chronic kidney disease), stage IIIa:Stable   Pressure Injury 09/12/19 Coccyx Medial Stage 1 -  Intact skin with non-blanchable redness of a localized area usually over a bony prominence. non-blanchable redness to sacral area (Active)  09/12/19 2155  Location: Coccyx  Location Orientation: Medial  Staging: Stage 1 -  Intact skin with non-blanchable redness of a localized area usually over a bony prominence.  Wound Description (Comments): non-blanchable redness to sacral area  Present on Admission: No   DISCHARGE CONDITIONS:  Stable CONSULTS OBTAINED:   DRUG ALLERGIES:   Allergies  Allergen Reactions  . Niaspan [Niacin Er] Other (See Comments)    Reaction:  Hot flashes    DISCHARGE MEDICATIONS:   Allergies as of 12/14/2019      Reactions   Niaspan [niacin Er] Other (See Comments)   Reaction:  Hot flashes  Medication List    TAKE these medications   allopurinol 300 MG tablet Commonly known as: ZYLOPRIM TAKE (1) TABLET BY MOUTH a day   amiodarone 200 MG tablet Commonly known as: PACERONE Take 200 mg by mouth daily. Dr Nehemiah Massed   ascorbic acid 500 MG tablet Commonly known as: VITAMIN C Take 1 tablet (500 mg total)  by mouth daily. Start taking on: December 15, 2019   aspirin 81 MG EC tablet Take by mouth.   carvedilol 12.5 MG tablet Commonly known as: Coreg Take 2 tablets (25 mg total) by mouth 2 (two) times daily. Dr Nehemiah Massed   hydrALAZINE 25 MG tablet Commonly known as: APRESOLINE Take 25 mg by mouth 2 (two) times daily.   isosorbide mononitrate 30 MG 24 hr tablet Commonly known as: IMDUR Take 30 mg by mouth daily. kowalski   montelukast 10 MG tablet Commonly known as: SINGULAIR TAKE (1) TABLET BY MOUTH DAILY AT BEDTIME   Potassium Chloride ER 20 MEQ Tbcr Take 20 mEq by mouth 2 (two) times daily.   torsemide 20 MG tablet Commonly known as: DEMADEX Take 20 mg by mouth daily.   zinc sulfate 220 (50 Zn) MG capsule Take 1 capsule (220 mg total) by mouth daily. Start taking on: December 15, 2019      DISCHARGE INSTRUCTIONS:   DIET:  Regular diet DISCHARGE CONDITION:  Stable ACTIVITY:  Activity as tolerated OXYGEN:  Home Oxygen: No.  Oxygen Delivery: room air DISCHARGE LOCATION:  nursing home -palliative care to follow  If you experience worsening of your admission symptoms, develop shortness of breath, life threatening emergency, suicidal or homicidal thoughts you must seek medical attention immediately by calling 911 or calling your MD immediately  if symptoms less severe.  You Must read complete instructions/literature along with all the possible adverse reactions/side effects for all the Medicines you take and that have been prescribed to you. Take any new Medicines after you have completely understood and accpet all the possible adverse reactions/side effects.   Please note  You were cared for by a hospitalist during your hospital stay. If you have any questions about your discharge medications or the care you received while you were in the hospital after you are discharged, you can call the unit and asked to speak with the hospitalist on call if the hospitalist that took care of  you is not available. Once you are discharged, your primary care physician will handle any further medical issues. Please note that NO REFILLS for any discharge medications will be authorized once you are discharged, as it is imperative that you return to your primary care physician (or establish a relationship with a primary care physician if you do not have one) for your aftercare needs so that they can reassess your need for medications and monitor your lab values.    On the day of Discharge:  VITAL SIGNS:  Blood pressure 119/74, pulse 74, temperature 98.1 F (36.7 C), temperature source Oral, resp. rate 18, height 5\' 11"  (1.803 m), weight 94.8 kg, SpO2 98 %. PHYSICAL EXAMINATION:  GENERAL:  79 y.o.-year-old patient lying in the bed with no acute distress.  EYES: Pupils equal, round, reactive to light and accommodation. No scleral icterus. Extraocular muscles intact.  HEENT: Head atraumatic, normocephalic. Oropharynx and nasopharynx clear.  NECK:  Supple, no jugular venous distention. No thyroid enlargement, no tenderness.  LUNGS: Normal breath sounds bilaterally, no wheezing, rales,rhonchi or crepitation. No use of accessory muscles of respiration.  CARDIOVASCULAR: S1, S2 normal.  No murmurs, rubs, or gallops.  ABDOMEN: Soft, non-tender, non-distended. Bowel sounds present. No organomegaly or mass.  EXTREMITIES: No pedal edema, cyanosis, or clubbing.  NEUROLOGIC: Cranial nerves II through XII are intact. Muscle strength 5/5 in all extremities. Sensation intact. Gait not checked.  PSYCHIATRIC: The patient is alert and oriented x 3.  SKIN: No obvious rash, lesion, or ulcer.  DATA REVIEW:   CBC Recent Labs  Lab 12/12/19 0624  WBC 6.6  HGB 11.2*  HCT 35.6*  PLT 182    Chemistries  Recent Labs  Lab 12/12/19 0624 12/13/19 0651 12/14/19 0543  NA 138  --  137  K 3.0*  --  3.0*  CL 103  --  102  CO2 27  --  25  GLUCOSE 110*  --  125*  BUN 36*  --  31*  CREATININE 1.55*  --   1.44*  CALCIUM 8.2*  --  8.1*  MG 2.1   < > 2.2  AST 15  --   --   ALT 15  --   --   ALKPHOS 149*  --   --   BILITOT 1.3*  --   --    < > = values in this interval not displayed.     Outpatient follow-up  Contact information for follow-up providers    [facility] Follow up on 01/10/2020.   Specialty: Cardiology Why: at 10:30am. Enter through the Medical Mall entrance Contact information: [address] [place] [address] [phone]       [name], MD. Schedule an appointment as soon as possible for a visit in 1 week(s).   Specialty: Internal Medicine Contact information: [address] [facility] [place] [address] [phone]        Vida Rigger, MD. Schedule an appointment as soon as possible for a visit in 2 week(s).   Specialty: Pulmonary Disease Contact information: [address] [place] [address] [phone]            Contact information for after-discharge care    Destination    [facility] Preferred SNF .   Service: Skilled Nursing Contact information: [address] [place] [address] [phone]                   Management plans discussed with the patient, family and they are in agreement.  CODE STATUS: DNR   TOTAL TIME TAKING CARE OF THIS PATIENT: 45 minutes.    Delfino Lovett M.D on 12/14/2019 at 9:14 AM  [facility]   CC: Primary care physician; [name], MD   Note: This dictation was prepared with Dragon dictation along with smaller phrase technology. Any transcriptional errors that result from this process are unintentional.

## 2019-12-16 DIAGNOSIS — I509 Heart failure, unspecified: Secondary | ICD-10-CM | POA: Diagnosis not present

## 2019-12-16 DIAGNOSIS — I248 Other forms of acute ischemic heart disease: Secondary | ICD-10-CM | POA: Diagnosis not present

## 2019-12-16 DIAGNOSIS — M109 Gout, unspecified: Secondary | ICD-10-CM | POA: Diagnosis not present

## 2019-12-21 ENCOUNTER — Telehealth: Payer: Self-pay | Admitting: Family

## 2019-12-21 NOTE — Telephone Encounter (Signed)
Called and LVM with transportation department at [facility] for patients follow up appointment with [place].    Larry Lawrence, [place]

## 2019-12-23 DIAGNOSIS — I5189 Other ill-defined heart diseases: Secondary | ICD-10-CM | POA: Diagnosis not present

## 2019-12-25 ENCOUNTER — Other Ambulatory Visit: Payer: Self-pay | Admitting: Internal Medicine

## 2019-12-25 DIAGNOSIS — U071 COVID-19: Secondary | ICD-10-CM

## 2019-12-27 DIAGNOSIS — G4709 Other insomnia: Secondary | ICD-10-CM | POA: Diagnosis not present

## 2019-12-28 DIAGNOSIS — I5189 Other ill-defined heart diseases: Secondary | ICD-10-CM | POA: Diagnosis not present

## 2019-12-28 DIAGNOSIS — J188 Other pneumonia, unspecified organism: Secondary | ICD-10-CM | POA: Diagnosis not present

## 2019-12-28 DIAGNOSIS — N178 Other acute kidney failure: Secondary | ICD-10-CM | POA: Diagnosis not present

## 2019-12-29 DIAGNOSIS — J189 Pneumonia, unspecified organism: Secondary | ICD-10-CM | POA: Diagnosis not present

## 2019-12-29 DIAGNOSIS — J90 Pleural effusion, not elsewhere classified: Secondary | ICD-10-CM | POA: Diagnosis not present

## 2019-12-29 DIAGNOSIS — I517 Cardiomegaly: Secondary | ICD-10-CM | POA: Diagnosis not present

## 2019-12-30 DIAGNOSIS — K922 Gastrointestinal hemorrhage, unspecified: Secondary | ICD-10-CM

## 2019-12-30 DIAGNOSIS — I5189 Other ill-defined heart diseases: Secondary | ICD-10-CM | POA: Diagnosis not present

## 2019-12-30 DIAGNOSIS — J188 Other pneumonia, unspecified organism: Secondary | ICD-10-CM | POA: Diagnosis not present

## 2019-12-30 DIAGNOSIS — N178 Other acute kidney failure: Secondary | ICD-10-CM | POA: Diagnosis not present

## 2019-12-30 HISTORY — DX: Gastrointestinal hemorrhage, unspecified: K92.2

## 2020-01-05 DIAGNOSIS — I10 Essential (primary) hypertension: Secondary | ICD-10-CM | POA: Diagnosis not present

## 2020-01-05 DIAGNOSIS — E785 Hyperlipidemia, unspecified: Secondary | ICD-10-CM | POA: Diagnosis not present

## 2020-01-05 DIAGNOSIS — D649 Anemia, unspecified: Secondary | ICD-10-CM | POA: Diagnosis not present

## 2020-01-05 DIAGNOSIS — I480 Paroxysmal atrial fibrillation: Secondary | ICD-10-CM | POA: Diagnosis not present

## 2020-01-06 DIAGNOSIS — I5189 Other ill-defined heart diseases: Secondary | ICD-10-CM | POA: Diagnosis not present

## 2020-01-06 DIAGNOSIS — I248 Other forms of acute ischemic heart disease: Secondary | ICD-10-CM | POA: Diagnosis not present

## 2020-01-06 DIAGNOSIS — I480 Paroxysmal atrial fibrillation: Secondary | ICD-10-CM | POA: Diagnosis not present

## 2020-01-06 DIAGNOSIS — N183 Chronic kidney disease, stage 3 unspecified: Secondary | ICD-10-CM | POA: Diagnosis not present

## 2020-01-06 DIAGNOSIS — U071 COVID-19: Secondary | ICD-10-CM | POA: Diagnosis not present

## 2020-01-09 NOTE — Progress Notes (Deleted)
Patient ID: Larry Lawrence, male    DOB: 1940/12/13, 79 y.o.   MRN: [id number]  HPI  Larry Lawrence is a 79 y/o male with a history of CAD, CKD, gout, TIA, remote tobacco use and chronic heart failure.   Echo report from 12/08/19 reviewed and showed an EF of 20-25% along with mildly elevated PA Pressure and mild/moderate Larry. Echo report from 07/20/2018 reviewed and showed an EF of 25-30% along with mild AR and moderate Larry/TR.   Catheterization done 02/02/16 showed an EF of 30% along with:  RPDA lesion, 55 %stenosed.  Prox RCA to Mid RCA lesion, 30 %stenosed.  Mid LAD to Dist LAD lesion, 20 %stenosed.  Prox LAD to Mid LAD lesion, 30 %stenosed.  Ramus-2 lesion, 40 %stenosed.  Ramus-1 lesion, 35 %stenosed.  Mid Cx to Dist Cx lesion, 40 %stenosed.  abnormal left ventricular function with ejection fraction of 30% and global dysfunction moderate 3 vessel coronary artery disease with patent lad stent There is not significant stenosis of left anterior descending, lcx , and or rca  Admitted 12/07/19 due to HF exacerbation along with COVID 19. Palliative care consult obtained. Initially given IV lasix with transition to oral diuretics. Completed 5 days of remdesivir. Discharged after 7 days.   He presents today for a follow-up visit although hasn't been seen since Feb 2020. He presents with a chief complaint of  Past Medical History:  Diagnosis Date  . Allergy   . CHF (congestive heart failure) (HCC)   . Coronary artery disease   . Gout   . Insomnia   . Renal disorder   . Sleeps in sitting position due to orthopnea   . TIA (transient ischemic attack)    Past Surgical History:  Procedure Laterality Date  . CARDIAC CATHETERIZATION N/A 02/02/2016   Procedure: Left Heart Cath and Coronary Angiography;  Surgeon: Larry Blinks, MD;  Location: [facility] INVASIVE CV LAB;  Service: Cardiovascular;  Laterality: N/A;  . CARDIAC SURGERY     3 stents in "main artery"  . JOINT REPLACEMENT     Family  History  Problem Relation Age of Onset  . Hypertension Mother   . Pancreatic cancer Mother   . Hypertension Father   . Prostate cancer Father    Social History   Tobacco Use  . Smoking status: Former Smoker    Packs/day: 2.00    Years: 3.00    Pack years: 6.00    Types: Cigarettes    Quit date: 1960    Years since quitting: 61.5  . Smokeless tobacco: Never Used  . Tobacco comment: Smoking cessation materials not required  Substance Use Topics  . Alcohol use: No   Allergies  Allergen Reactions  . Niaspan [Niacin Er] Other (See Comments)    Reaction:  Hot flashes      Review of Systems  Constitutional: Positive for fatigue. Negative for appetite change.  HENT: Negative for congestion, rhinorrhea and sore throat.   Eyes: Negative.   Respiratory: Positive for cough and shortness of breath (with moderate exertion). Negative for chest tightness.   Cardiovascular: Positive for palpitations (at times) and leg swelling. Negative for chest pain.  Gastrointestinal: Negative for abdominal distention and abdominal pain.  Endocrine: Negative.   Genitourinary: Negative.   Musculoskeletal: Negative for back pain and neck pain.  Skin: Negative.   Allergic/Immunologic: Negative.   Neurological: Negative for dizziness and light-headedness.  Hematological: Negative for adenopathy. Bruises/bleeds easily.  Psychiatric/Behavioral: Negative for dysphoric mood and sleep disturbance (  sleeping in recliner due to comfort). The patient is not nervous/anxious.      Physical Exam Vitals and nursing note reviewed.  Constitutional:      Appearance: He is well-developed.  HENT:     Head: Normocephalic and atraumatic.  Cardiovascular:     Rate and Rhythm: Regular rhythm. Bradycardia present.  Pulmonary:     Effort: Pulmonary effort is normal. No respiratory distress.     Breath sounds: No wheezing or rales.  Musculoskeletal:     Cervical back: Normal range of motion and neck supple.      Right lower leg: Edema (1+ pitting) present.     Left lower leg: Edema (1+ pitting) present.  Skin:    General: Skin is warm and dry.  Neurological:     General: No focal deficit present.     Mental Status: He is alert and oriented to person, place, and time.  Psychiatric:        Behavior: Behavior normal.    Assessment & Plan:  1: Chronic heart failure with reduced ejection fraction- - NYHA class II - euvolemic today - weighing daily and he was reminded to call for an overnight weight gain of >2 pounds or a weekly weight gain of >5 pounds - not adding salt and his granddaughter-in-law does most of the cooking. Larry Lawrence says that they have been reading food labels closely for sodium content.  - saw cardiology Larry Lawrence) 04/28/2019 - BNP 12/07/19 was 2150.1 - PharmD reconciled medications  2: Chronic kidney disease- - saw PCP Larry Lawrence) 06/09/2019 - BMP from 12/14/19 reviewed and showed sodium 137, potassium 3.0, creatinine 1.44 and GFR 54  3: Lymphedema- - stage 2 - wearing compression socks but edema persists - elevating his legs "some" and he was encouraged to elevate them when sitting for long periods of time - limited in his ability to exercise due to mobility issues - consider lymphapress compression boots if edema persists  4: COVID-19- - saw pulmonology Larry Lawrence) 12/29/19   Patient did not bring his medications nor a list. Each medication was verbally reviewed with the patient and he was encouraged to bring the bottles to every visit to confirm accuracy of list.

## 2020-01-10 ENCOUNTER — Telehealth: Payer: Self-pay | Admitting: Family

## 2020-01-10 ENCOUNTER — Other Ambulatory Visit: Payer: Self-pay | Admitting: Internal Medicine

## 2020-01-10 ENCOUNTER — Ambulatory Visit: Payer: Medicare Other | Admitting: Family

## 2020-01-10 NOTE — Telephone Encounter (Signed)
Patient did not show for his Heart Failure Clinic appointment on 01/10/20. Will attempt to reschedule.  

## 2020-01-12 DIAGNOSIS — R600 Localized edema: Secondary | ICD-10-CM | POA: Diagnosis not present

## 2020-01-12 DIAGNOSIS — N178 Other acute kidney failure: Secondary | ICD-10-CM | POA: Diagnosis not present

## 2020-01-12 DIAGNOSIS — N183 Chronic kidney disease, stage 3 unspecified: Secondary | ICD-10-CM | POA: Diagnosis not present

## 2020-01-12 DIAGNOSIS — I5189 Other ill-defined heart diseases: Secondary | ICD-10-CM | POA: Diagnosis not present

## 2020-01-20 DIAGNOSIS — E876 Hypokalemia: Secondary | ICD-10-CM | POA: Diagnosis not present

## 2020-01-20 DIAGNOSIS — I480 Paroxysmal atrial fibrillation: Secondary | ICD-10-CM | POA: Diagnosis not present

## 2020-01-20 DIAGNOSIS — Z7409 Other reduced mobility: Secondary | ICD-10-CM | POA: Diagnosis not present

## 2020-01-20 DIAGNOSIS — R2681 Unsteadiness on feet: Secondary | ICD-10-CM | POA: Diagnosis not present

## 2020-01-20 DIAGNOSIS — U071 COVID-19: Secondary | ICD-10-CM | POA: Diagnosis not present

## 2020-01-20 DIAGNOSIS — I5189 Other ill-defined heart diseases: Secondary | ICD-10-CM | POA: Diagnosis not present

## 2020-01-20 DIAGNOSIS — I5023 Acute on chronic systolic (congestive) heart failure: Secondary | ICD-10-CM | POA: Diagnosis not present

## 2020-01-20 DIAGNOSIS — M6281 Muscle weakness (generalized): Secondary | ICD-10-CM | POA: Diagnosis not present

## 2020-01-20 DIAGNOSIS — D649 Anemia, unspecified: Secondary | ICD-10-CM | POA: Diagnosis not present

## 2020-01-20 DIAGNOSIS — I2510 Atherosclerotic heart disease of native coronary artery without angina pectoris: Secondary | ICD-10-CM | POA: Diagnosis not present

## 2020-01-20 DIAGNOSIS — Z8673 Personal history of transient ischemic attack (TIA), and cerebral infarction without residual deficits: Secondary | ICD-10-CM | POA: Diagnosis not present

## 2020-01-20 DIAGNOSIS — R2689 Other abnormalities of gait and mobility: Secondary | ICD-10-CM | POA: Diagnosis not present

## 2020-01-21 DIAGNOSIS — M25561 Pain in right knee: Secondary | ICD-10-CM | POA: Diagnosis not present

## 2020-01-21 DIAGNOSIS — R609 Edema, unspecified: Secondary | ICD-10-CM | POA: Diagnosis not present

## 2020-01-21 DIAGNOSIS — U071 COVID-19: Secondary | ICD-10-CM | POA: Diagnosis not present

## 2020-01-21 DIAGNOSIS — M6281 Muscle weakness (generalized): Secondary | ICD-10-CM | POA: Diagnosis not present

## 2020-01-21 DIAGNOSIS — R2681 Unsteadiness on feet: Secondary | ICD-10-CM | POA: Diagnosis not present

## 2020-01-21 DIAGNOSIS — R2689 Other abnormalities of gait and mobility: Secondary | ICD-10-CM | POA: Diagnosis not present

## 2020-01-21 DIAGNOSIS — M25562 Pain in left knee: Secondary | ICD-10-CM | POA: Diagnosis not present

## 2020-01-21 DIAGNOSIS — I509 Heart failure, unspecified: Secondary | ICD-10-CM | POA: Diagnosis not present

## 2020-01-21 DIAGNOSIS — Z8673 Personal history of transient ischemic attack (TIA), and cerebral infarction without residual deficits: Secondary | ICD-10-CM | POA: Diagnosis not present

## 2020-01-21 DIAGNOSIS — I480 Paroxysmal atrial fibrillation: Secondary | ICD-10-CM | POA: Diagnosis not present

## 2020-01-21 DIAGNOSIS — Z7409 Other reduced mobility: Secondary | ICD-10-CM | POA: Diagnosis not present

## 2020-01-21 DIAGNOSIS — I2510 Atherosclerotic heart disease of native coronary artery without angina pectoris: Secondary | ICD-10-CM | POA: Diagnosis not present

## 2020-01-22 DIAGNOSIS — M25562 Pain in left knee: Secondary | ICD-10-CM | POA: Diagnosis not present

## 2020-01-22 DIAGNOSIS — M25561 Pain in right knee: Secondary | ICD-10-CM | POA: Diagnosis not present

## 2020-01-24 DIAGNOSIS — U071 COVID-19: Secondary | ICD-10-CM | POA: Diagnosis not present

## 2020-01-24 DIAGNOSIS — I480 Paroxysmal atrial fibrillation: Secondary | ICD-10-CM | POA: Diagnosis not present

## 2020-01-24 DIAGNOSIS — I2510 Atherosclerotic heart disease of native coronary artery without angina pectoris: Secondary | ICD-10-CM | POA: Diagnosis not present

## 2020-01-24 DIAGNOSIS — R2689 Other abnormalities of gait and mobility: Secondary | ICD-10-CM | POA: Diagnosis not present

## 2020-01-24 DIAGNOSIS — M6281 Muscle weakness (generalized): Secondary | ICD-10-CM | POA: Diagnosis not present

## 2020-01-24 DIAGNOSIS — Z7409 Other reduced mobility: Secondary | ICD-10-CM | POA: Diagnosis not present

## 2020-01-24 DIAGNOSIS — R2681 Unsteadiness on feet: Secondary | ICD-10-CM | POA: Diagnosis not present

## 2020-01-24 DIAGNOSIS — Z8673 Personal history of transient ischemic attack (TIA), and cerebral infarction without residual deficits: Secondary | ICD-10-CM | POA: Diagnosis not present

## 2020-01-25 ENCOUNTER — Emergency Department (HOSPITAL_COMMUNITY): Payer: Medicare Other

## 2020-01-25 ENCOUNTER — Other Ambulatory Visit: Payer: Self-pay

## 2020-01-25 ENCOUNTER — Encounter (HOSPITAL_COMMUNITY): Payer: Self-pay

## 2020-01-25 ENCOUNTER — Inpatient Hospital Stay (HOSPITAL_COMMUNITY)
Admission: EM | Admit: 2020-01-25 | Discharge: 2020-02-03 | DRG: 378 | Disposition: A | Discharge status: Skilled Nursing Facility | Payer: Medicare Other | Attending: Family Medicine | Admitting: Family Medicine

## 2020-01-25 DIAGNOSIS — Z87891 Personal history of nicotine dependence: Secondary | ICD-10-CM

## 2020-01-25 DIAGNOSIS — D62 Acute posthemorrhagic anemia: Secondary | ICD-10-CM | POA: Diagnosis present

## 2020-01-25 DIAGNOSIS — M7989 Other specified soft tissue disorders: Secondary | ICD-10-CM | POA: Diagnosis present

## 2020-01-25 DIAGNOSIS — Z66 Do not resuscitate: Secondary | ICD-10-CM | POA: Diagnosis not present

## 2020-01-25 DIAGNOSIS — L89159 Pressure ulcer of sacral region, unspecified stage: Secondary | ICD-10-CM | POA: Diagnosis not present

## 2020-01-25 DIAGNOSIS — I130 Hypertensive heart and chronic kidney disease with heart failure and stage 1 through stage 4 chronic kidney disease, or unspecified chronic kidney disease: Secondary | ICD-10-CM | POA: Diagnosis not present

## 2020-01-25 DIAGNOSIS — I4891 Unspecified atrial fibrillation: Secondary | ICD-10-CM | POA: Diagnosis present

## 2020-01-25 DIAGNOSIS — N179 Acute kidney failure, unspecified: Secondary | ICD-10-CM | POA: Diagnosis present

## 2020-01-25 DIAGNOSIS — R609 Edema, unspecified: Secondary | ICD-10-CM

## 2020-01-25 DIAGNOSIS — R2681 Unsteadiness on feet: Secondary | ICD-10-CM | POA: Diagnosis not present

## 2020-01-25 DIAGNOSIS — G4733 Obstructive sleep apnea (adult) (pediatric): Secondary | ICD-10-CM | POA: Diagnosis present

## 2020-01-25 DIAGNOSIS — I10 Essential (primary) hypertension: Secondary | ICD-10-CM | POA: Diagnosis present

## 2020-01-25 DIAGNOSIS — Z7401 Bed confinement status: Secondary | ICD-10-CM

## 2020-01-25 DIAGNOSIS — Z515 Encounter for palliative care: Secondary | ICD-10-CM | POA: Diagnosis not present

## 2020-01-25 DIAGNOSIS — Z20822 Contact with and (suspected) exposure to covid-19: Secondary | ICD-10-CM | POA: Diagnosis not present

## 2020-01-25 DIAGNOSIS — Z955 Presence of coronary angioplasty implant and graft: Secondary | ICD-10-CM

## 2020-01-25 DIAGNOSIS — R2689 Other abnormalities of gait and mobility: Secondary | ICD-10-CM | POA: Diagnosis not present

## 2020-01-25 DIAGNOSIS — K922 Gastrointestinal hemorrhage, unspecified: Secondary | ICD-10-CM

## 2020-01-25 DIAGNOSIS — R58 Hemorrhage, not elsewhere classified: Secondary | ICD-10-CM | POA: Diagnosis not present

## 2020-01-25 DIAGNOSIS — K5731 Diverticulosis of large intestine without perforation or abscess with bleeding: Principal | ICD-10-CM | POA: Diagnosis present

## 2020-01-25 DIAGNOSIS — E785 Hyperlipidemia, unspecified: Secondary | ICD-10-CM | POA: Diagnosis not present

## 2020-01-25 DIAGNOSIS — M6281 Muscle weakness (generalized): Secondary | ICD-10-CM | POA: Diagnosis not present

## 2020-01-25 DIAGNOSIS — I480 Paroxysmal atrial fibrillation: Secondary | ICD-10-CM | POA: Diagnosis not present

## 2020-01-25 DIAGNOSIS — R54 Age-related physical debility: Secondary | ICD-10-CM | POA: Diagnosis present

## 2020-01-25 DIAGNOSIS — Z6831 Body mass index (BMI) 31.0-31.9, adult: Secondary | ICD-10-CM

## 2020-01-25 DIAGNOSIS — I5022 Chronic systolic (congestive) heart failure: Secondary | ICD-10-CM | POA: Diagnosis not present

## 2020-01-25 DIAGNOSIS — I517 Cardiomegaly: Secondary | ICD-10-CM | POA: Diagnosis not present

## 2020-01-25 DIAGNOSIS — K5900 Constipation, unspecified: Secondary | ICD-10-CM | POA: Diagnosis present

## 2020-01-25 DIAGNOSIS — D500 Iron deficiency anemia secondary to blood loss (chronic): Secondary | ICD-10-CM | POA: Diagnosis not present

## 2020-01-25 DIAGNOSIS — K625 Hemorrhage of anus and rectum: Secondary | ICD-10-CM | POA: Diagnosis not present

## 2020-01-25 DIAGNOSIS — E8809 Other disorders of plasma-protein metabolism, not elsewhere classified: Secondary | ICD-10-CM | POA: Diagnosis present

## 2020-01-25 DIAGNOSIS — M109 Gout, unspecified: Secondary | ICD-10-CM | POA: Diagnosis present

## 2020-01-25 DIAGNOSIS — Z7409 Other reduced mobility: Secondary | ICD-10-CM | POA: Diagnosis not present

## 2020-01-25 DIAGNOSIS — U071 COVID-19: Secondary | ICD-10-CM | POA: Diagnosis not present

## 2020-01-25 DIAGNOSIS — Z79899 Other long term (current) drug therapy: Secondary | ICD-10-CM

## 2020-01-25 DIAGNOSIS — I2510 Atherosclerotic heart disease of native coronary artery without angina pectoris: Secondary | ICD-10-CM | POA: Diagnosis not present

## 2020-01-25 DIAGNOSIS — Z8673 Personal history of transient ischemic attack (TIA), and cerebral infarction without residual deficits: Secondary | ICD-10-CM | POA: Diagnosis not present

## 2020-01-25 DIAGNOSIS — Z743 Need for continuous supervision: Secondary | ICD-10-CM | POA: Diagnosis not present

## 2020-01-25 DIAGNOSIS — Z7189 Other specified counseling: Secondary | ICD-10-CM | POA: Diagnosis not present

## 2020-01-25 DIAGNOSIS — N1831 Chronic kidney disease, stage 3a: Secondary | ICD-10-CM | POA: Diagnosis not present

## 2020-01-25 DIAGNOSIS — R531 Weakness: Secondary | ICD-10-CM

## 2020-01-25 DIAGNOSIS — I959 Hypotension, unspecified: Secondary | ICD-10-CM | POA: Diagnosis not present

## 2020-01-25 DIAGNOSIS — Z8249 Family history of ischemic heart disease and other diseases of the circulatory system: Secondary | ICD-10-CM

## 2020-01-25 DIAGNOSIS — N5089 Other specified disorders of the male genital organs: Secondary | ICD-10-CM | POA: Diagnosis present

## 2020-01-25 DIAGNOSIS — Z7982 Long term (current) use of aspirin: Secondary | ICD-10-CM

## 2020-01-25 DIAGNOSIS — J90 Pleural effusion, not elsewhere classified: Secondary | ICD-10-CM | POA: Diagnosis not present

## 2020-01-25 DIAGNOSIS — Z888 Allergy status to other drugs, medicaments and biological substances status: Secondary | ICD-10-CM

## 2020-01-25 DIAGNOSIS — I5023 Acute on chronic systolic (congestive) heart failure: Secondary | ICD-10-CM | POA: Diagnosis not present

## 2020-01-25 DIAGNOSIS — E669 Obesity, unspecified: Secondary | ICD-10-CM | POA: Diagnosis present

## 2020-01-25 LAB — COMPREHENSIVE METABOLIC PANEL
ALT: 17 U/L (ref 0–44)
AST: 23 U/L (ref 15–41)
Albumin: 1.9 g/dL — ABNORMAL LOW (ref 3.5–5.0)
Alkaline Phosphatase: 156 U/L — ABNORMAL HIGH (ref 38–126)
Anion gap: 9 (ref 5–15)
BUN: 38 mg/dL — ABNORMAL HIGH (ref 8–23)
CO2: 23 mmol/L (ref 22–32)
Calcium: 8 mg/dL — ABNORMAL LOW (ref 8.9–10.3)
Chloride: 109 mmol/L (ref 98–111)
Creatinine, Ser: 1.93 mg/dL — ABNORMAL HIGH (ref 0.61–1.24)
GFR calc Af Amer: 38 mL/min — ABNORMAL LOW (ref >60)
GFR calc non Af Amer: 33 mL/min — ABNORMAL LOW (ref >60)
Glucose, Bld: 188 mg/dL — ABNORMAL HIGH (ref 70–99)
Potassium: 4.4 mmol/L (ref 3.5–5.1)
Sodium: 141 mmol/L (ref 135–145)
Total Bilirubin: 1 mg/dL (ref 0.3–1.2)
Total Protein: 5.3 g/dL — ABNORMAL LOW (ref 6.5–8.1)

## 2020-01-25 LAB — TROPONIN I (HIGH SENSITIVITY): Troponin I (High Sensitivity): 12 ng/L (ref <18)

## 2020-01-25 LAB — URINALYSIS, ROUTINE W REFLEX MICROSCOPIC
Bilirubin Urine: NEGATIVE
Glucose, UA: NEGATIVE mg/dL
Hgb urine dipstick: NEGATIVE
Ketones, ur: NEGATIVE mg/dL
Nitrite: NEGATIVE
Protein, ur: 30 mg/dL — AB
Specific Gravity, Urine: 1.017 (ref 1.005–1.030)
pH: 8 (ref 5.0–8.0)

## 2020-01-25 LAB — CBC WITH DIFFERENTIAL/PLATELET
Abs Immature Granulocytes: 0.09 10*3/uL — ABNORMAL HIGH (ref 0.00–0.07)
Basophils Absolute: 0 10*3/uL (ref 0.0–0.1)
Basophils Relative: 0 %
Eosinophils Absolute: 0.1 10*3/uL (ref 0.0–0.5)
Eosinophils Relative: 1 %
HCT: 31.3 % — ABNORMAL LOW (ref 39.0–52.0)
Hemoglobin: 9.4 g/dL — ABNORMAL LOW (ref 13.0–17.0)
Immature Granulocytes: 1 %
Lymphocytes Relative: 12 %
Lymphs Abs: 0.9 10*3/uL (ref 0.7–4.0)
MCH: 29.3 pg (ref 26.0–34.0)
MCHC: 30 g/dL (ref 30.0–36.0)
MCV: 97.5 fL (ref 80.0–100.0)
Monocytes Absolute: 0.6 10*3/uL (ref 0.1–1.0)
Monocytes Relative: 8 %
Neutro Abs: 6.2 10*3/uL (ref 1.7–7.7)
Neutrophils Relative %: 78 %
Platelets: 255 10*3/uL (ref 150–400)
RBC: 3.21 MIL/uL — ABNORMAL LOW (ref 4.22–5.81)
RDW: 17.1 % — ABNORMAL HIGH (ref 11.5–15.5)
WBC: 7.9 10*3/uL (ref 4.0–10.5)
nRBC: 0 % (ref 0.0–0.2)

## 2020-01-25 LAB — PROTIME-INR
INR: 1.3 — ABNORMAL HIGH (ref 0.8–1.2)
Prothrombin Time: 15.4 seconds — ABNORMAL HIGH (ref 11.4–15.2)

## 2020-01-25 LAB — LACTIC ACID, PLASMA: Lactic Acid, Venous: 2 mmol/L (ref 0.5–1.9)

## 2020-01-25 LAB — ABO/RH: ABO/RH(D): A POS

## 2020-01-25 MED ORDER — SODIUM CHLORIDE 0.9 % IV BOLUS
500.0000 mL | Freq: Once | INTRAVENOUS | Status: AC
  Administered 2020-01-25: 500 mL via INTRAVENOUS

## 2020-01-25 MED ORDER — SODIUM CHLORIDE 0.9 % IV SOLN: Freq: Once | INTRAVENOUS | Status: AC

## 2020-01-25 NOTE — H&P (Addendum)
PCP:   [name], MD   Chief Complaint:  Rectal bleeding  HPI: 79 year old male resident of a nursing home.  He presents to the ER after it was noted he had rectal bleeding.  Per patient he said that this started last night.  He was not much blood but he states recurred today and was a bit worse.  He states he has never had rectal bleeding before.  He states was bright red blood per rectum.  He denies any pain.  He states his never had a colonoscopy.  He denies any significant problems with constipation.  He said his appetite has been good but just a bit decreased lately.  In the ER his hemoglobin is down to 9.4.  Which represents a 2 unit drop.  The hospitalist have been asked admit.  Guaiac done by the ER physician which shows a dark red blood.  He has occult positive stool.  Review of Systems:  The patient denies anorexia, fever, weight loss,, vision loss, decreased hearing, hoarseness, chest pain, syncope, dyspnea on exertion, peripheral edema, balance deficits, hemoptysis, abdominal pain, melena, hematochezia, severe indigestion/heartburn, hematuria, incontinence, genital sores, muscle weakness, suspicious skin lesions, transient blindness, difficulty walking, depression, unusual weight change, abnormal bleeding, enlarged lymph nodes, angioedema, and breast masses.  Past Medical History: Past Medical History:  Diagnosis Date  . Allergy   . CHF (congestive heart failure) (HCC)   . Coronary artery disease   . Gout   . Insomnia   . Renal disorder   . Sleeps in sitting position due to orthopnea   . TIA (transient ischemic attack)    Past Surgical History:  Procedure Laterality Date  . CARDIAC CATHETERIZATION N/A 02/02/2016   Procedure: Left Heart Cath and Coronary Angiography;  Surgeon: Lamar Blinks, MD;  Location: [facility] INVASIVE CV LAB;  Service: Cardiovascular;  Laterality: N/A;  . CARDIAC SURGERY     3 stents in "main artery"  . JOINT REPLACEMENT       Medications: Prior to Admission medications   Medication Sig Start Date End Date Taking? Authorizing Provider  allopurinol (ZYLOPRIM) 300 MG tablet TAKE (1) TABLET BY MOUTH a day 06/09/19   Duanne Limerick, MD  amiodarone (PACERONE) 200 MG tablet Take 200 mg by mouth daily. Dr Andres Ege, Quentin Cornwall, MD  ascorbic acid (VITAMIN C) 500 MG tablet Take 1 tablet (500 mg total) by mouth daily. 12/15/19   Delfino Lovett, MD  aspirin 81 MG EC tablet Take by mouth.    [provider]  carvedilol (COREG) 12.5 MG tablet Take 2 tablets (25 mg total) by mouth 2 (two) times daily. Dr Gwen Pounds 09/13/19 12/07/19  Enedina Finner, MD  hydrALAZINE (APRESOLINE) 25 MG tablet Take 25 mg by mouth 2 (two) times daily.    [provider]  isosorbide mononitrate (IMDUR) 30 MG 24 hr tablet Take 30 mg by mouth daily. kowalski    [provider]  montelukast (SINGULAIR) 10 MG tablet TAKE (1) TABLET BY MOUTH DAILY AT BEDTIME 06/09/19   Duanne Limerick, MD  Potassium Chloride ER 20 MEQ TBCR Take 20 mEq by mouth 2 (two) times daily.     [provider]  torsemide (DEMADEX) 20 MG tablet Take 20 mg by mouth daily.    [provider]  zinc sulfate 220 (50 Zn) MG capsule Take 1 capsule (220 mg total) by mouth daily. 12/15/19   Delfino Lovett, MD    Allergies:   Allergies  Allergen Reactions  . Niaspan [Niacin Er] Other (See Comments)    Reaction:  Hot flashes     Social History:  reports that he quit smoking about 61 years ago. His smoking use included cigarettes. He has a 6.00 pack-year smoking history. He has never used smokeless tobacco. He reports that he does not drink alcohol and does not use drugs.  Family History: Family History  Problem Relation Age of Onset  . Hypertension Mother   . Pancreatic cancer Mother   . Hypertension Father   . Prostate cancer Father     Physical Exam: Vitals:   01/25/20 2102 01/25/20 2130 01/25/20 2145 01/25/20 2245  BP: 111/79 (!)  114/90 121/82 115/83  Pulse: 103 69 81 88  Resp: 21 19 19 19   Temp:      TempSrc:      SpO2: 99% 100% 100% 97%  Weight:      Height:        General:  Alert and oriented times three, well developed and nourished, no acute distress Eyes: PERRLA, pink conjunctiva, no scleral icterus ENT: Moist oral mucosa, neck supple, no thyromegaly Lungs: clear to ascultation, no wheeze, no crackles, no use of accessory muscles Cardiovascular: regular rate and rhythm, no regurgitation, no gallops, no murmurs. No carotid bruits, no JVD Abdomen: soft, positive BS, non-tender, non-distended, no organomegaly, not an acute abdomen GU: not examined Neuro: CN II - XII grossly intact, sensation intact Musculoskeletal: strength 5/5 all extremities, no clubbing, cyanosis or edema Skin: no rash, no subcutaneous crepitation, no decubitus Psych: appropriate patient RUE: swollen  Labs on Admission:  Recent Labs    01/25/20 2104  NA 141  K 4.4  CL 109  CO2 23  GLUCOSE 188*  BUN 38*  CREATININE 1.93*  CALCIUM 8.0*   Recent Labs    01/25/20 2104  AST 23  ALT 17  ALKPHOS 156*  BILITOT 1.0  PROT 5.3*  ALBUMIN 1.9*   No results for input(s): LIPASE, AMYLASE in the last 72 hours. Recent Labs    01/25/20 2104  WBC 7.9  NEUTROABS 6.2  HGB 9.4*  HCT 31.3*  MCV 97.5  PLT 255   No results for input(s): CKTOTAL, CKMB, CKMBINDEX, TROPONINI in the last 72 hours. Invalid input(s): POCBNP No results for input(s): DDIMER in the last 72 hours. No results for input(s): HGBA1C in the last 72 hours. No results for input(s): CHOL, HDL, LDLCALC, TRIG, CHOLHDL, LDLDIRECT in the last 72 hours. No results for input(s): TSH, T4TOTAL, T3FREE, THYROIDAB in the last 72 hours.  Invalid input(s): FREET3 No results for input(s): VITAMINB12, FOLATE, FERRITIN, TIBC, IRON, RETICCTPCT in the last 72 hours.  Micro Results: No results found for this or any previous visit (from the past 240 hour(s)).   Radiological  Exams on Admission: DG Chest Port 1 View  Result Date: 01/25/2020 CLINICAL DATA:  Weakness EXAM: PORTABLE CHEST 1 VIEW COMPARISON:  December 07, 2019 FINDINGS: There is unchanged moderate cardiomegaly. Aortic knob calcifications are seen. Mildly increased interstitial markings seen throughout lungs however improved since the prior exam. A small bilateral effusions are seen, right greater than. IMPRESSION: Slight interval improvement in the interstitial edema. Small bilateral effusions, right greater. Electronically Signed   By: December 09, 2019 M.D.   On: 01/25/2020 20:14    Assessment/Plan Present on Admission: . GI bleed . Anemia due to GI blood loss -bring in for overnight obs -serial H/H -NPO at midnight -GI consult called by ER physician -IV Protonix -CEA  ordered  . CAD (coronary artery disease) . Chronic systolic CHF (congestive heart failure) (HCC) -Last 2D echo 12/08/2019, EF 20 to 25% -careful for fluid overload with transfusions.  Goal hemoglobin to keep approximately 10 -Patient typed and screened  Right upper extremity swelling -Dopplers ordered, rule out DVT  . HTN (hypertension) -Stable, home meds resumed with hold parameters in his blood pressure  . HLD (hyperlipidemia) -Stable, home meds resumed  Chronic kidney disease -Stable, at baseline  Larry Lawrence 01/25/2020, 11:23 PM

## 2020-01-25 NOTE — ED Notes (Signed)
Larry Lawrence, daughter, [phone] would like an update when available

## 2020-01-25 NOTE — ED Provider Notes (Signed)
[facility] EMERGENCY DEPARTMENT Provider Note   CSN: [id number] Arrival date & time: 01/25/20  1942     History Chief Complaint  Patient presents with  . Rectal Bleeding    Larry Lawrence is a 79 y.o. male.  79 year old male with prior medical history detailed below presents for evaluation of reported GI bleed.  Patient with bloody stool noted over the last 24 hours.  Patient is otherwise without complaint.  He denies abdominal pain.  He denies associated nausea, vomiting, fever.  The history is provided by the patient and medical records.  Rectal Bleeding Quality:  Maroon Amount:  Moderate Duration:  1 day Timing:  Constant Chronicity:  New Relieved by:  Nothing Worsened by:  Nothing Associated symptoms: no abdominal pain and no fever        Past Medical History:  Diagnosis Date  . Allergy   . CHF (congestive heart failure) (HCC)   . Coronary artery disease   . Gout   . Insomnia   . Renal disorder   . Sleeps in sitting position due to orthopnea   . TIA (transient ischemic attack)     Patient Active Problem List   Diagnosis Date Noted  . Goals of care, counseling/discussion   . Palliative care by specialist   . Acute on chronic systolic CHF (congestive heart failure) (HCC) 12/07/2019  . CKD (chronic kidney disease), stage III 12/07/2019  . COVID-19 virus infection 12/07/2019  . Acute on chronic systolic (congestive) heart failure (HCC) 12/07/2019  . Pressure injury of skin 09/13/2019  . Ambulatory dysfunction   . Atrial fibrillation (HCC)   . Fall 09/09/2019  . Leukocytosis 09/09/2019  . TIA (transient ischemic attack)   . CAD (coronary artery disease)   . HTN (hypertension)   . HLD (hyperlipidemia)   . V tach (HCC)   . Right knee pain   . Atrial fibrillation, new onset (HCC)   . Generalized weakness   . Lymphedema 08/24/2018  . Acute CHF (congestive heart failure) (HCC) 06/30/2018  . Near syncope 04/24/2017  . Bradycardia  04/24/2017  . Gout 02/01/2016  . Syncope 08/09/2015  . Hypokalemia 08/09/2015  . Elevated troponin 02/20/2015  . Acute renal failure superimposed on stage 3a chronic kidney disease (HCC) 02/20/2015  . Chronic systolic CHF (congestive heart failure) (HCC) 02/20/2015  . CKD (chronic kidney disease) 02/20/2015    Past Surgical History:  Procedure Laterality Date  . CARDIAC CATHETERIZATION N/A 02/02/2016   Procedure: Left Heart Cath and Coronary Angiography;  Surgeon: Lamar Blinks, MD;  Location: [facility] INVASIVE CV LAB;  Service: Cardiovascular;  Laterality: N/A;  . CARDIAC SURGERY     3 stents in "main artery"  . JOINT REPLACEMENT         Family History  Problem Relation Age of Onset  . Hypertension Mother   . Pancreatic cancer Mother   . Hypertension Father   . Prostate cancer Father     Social History   Tobacco Use  . Smoking status: Former Smoker    Packs/day: 2.00    Years: 3.00    Pack years: 6.00    Types: Cigarettes    Quit date: 1960    Years since quitting: 61.6  . Smokeless tobacco: Never Used  . Tobacco comment: Smoking cessation materials not required  Vaping Use  . Vaping Use: Never used  Substance Use Topics  . Alcohol use: No  . Drug use: No    Home Medications Prior to Admission  medications   Medication Sig Start Date End Date Taking? Authorizing Provider  allopurinol (ZYLOPRIM) 300 MG tablet TAKE (1) TABLET BY MOUTH a day 06/09/19   Duanne Limerick, MD  amiodarone (PACERONE) 200 MG tablet Take 200 mg by mouth daily. Dr Andres Ege, Quentin Cornwall, MD  ascorbic acid (VITAMIN C) 500 MG tablet Take 1 tablet (500 mg total) by mouth daily. 12/15/19   Delfino Lovett, MD  aspirin 81 MG EC tablet Take by mouth.    [provider]  carvedilol (COREG) 12.5 MG tablet Take 2 tablets (25 mg total) by mouth 2 (two) times daily. Dr Gwen Pounds 09/13/19 12/07/19  Enedina Finner, MD  hydrALAZINE (APRESOLINE) 25 MG tablet Take 25 mg by mouth 2 (two) times daily.     [provider]  isosorbide mononitrate (IMDUR) 30 MG 24 hr tablet Take 30 mg by mouth daily. kowalski    [provider]  montelukast (SINGULAIR) 10 MG tablet TAKE (1) TABLET BY MOUTH DAILY AT BEDTIME 06/09/19   Duanne Limerick, MD  Potassium Chloride ER 20 MEQ TBCR Take 20 mEq by mouth 2 (two) times daily.     [provider]  torsemide (DEMADEX) 20 MG tablet Take 20 mg by mouth daily.    [provider]  zinc sulfate 220 (50 Zn) MG capsule Take 1 capsule (220 mg total) by mouth daily. 12/15/19   Delfino Lovett, MD    Allergies    Niaspan [niacin er]  Review of Systems   Review of Systems  Constitutional: Negative for fever.  Gastrointestinal: Positive for hematochezia. Negative for abdominal pain.  All other systems reviewed and are negative.   Physical Exam Updated Vital Signs BP 115/83   Pulse 88   Temp 97.7 F (36.5 C) (Oral)   Resp 19   Ht 5\' 11"  (1.803 m)   Wt (!) 95 kg   SpO2 97%   BMI 29.21 kg/m   Physical Exam Vitals and nursing note reviewed.  Constitutional:      General: He is not in acute distress.    Appearance: Normal appearance. He is well-developed.  HENT:     Head: Normocephalic and atraumatic.  Eyes:     Conjunctiva/sclera: Conjunctivae normal.     Pupils: Pupils are equal, round, and reactive to light.  Cardiovascular:     Rate and Rhythm: Normal rate and regular rhythm.     Heart sounds: Normal heart sounds.  Pulmonary:     Effort: Pulmonary effort is normal. No respiratory distress.     Breath sounds: Normal breath sounds.  Abdominal:     General: There is no distension.     Palpations: Abdomen is soft.     Tenderness: There is no abdominal tenderness.  Genitourinary:    Comments: Reddish blood mixed with maroon-colored stool. Musculoskeletal:        General: No deformity. Normal range of motion.     Cervical back: Normal range of motion and neck supple.  Skin:    General: Skin is warm and dry.   Neurological:     Mental Status: He is alert and oriented to person, place, and time. Mental status is at baseline.     ED Results / Procedures / Treatments   Labs (all labs ordered are listed, but only abnormal results are displayed) Labs Reviewed  COMPREHENSIVE METABOLIC PANEL - Abnormal; Notable for the following components:      Result Value   Glucose, Bld 188 (*)  BUN 38 (*)    Creatinine, Ser 1.93 (*)    Calcium 8.0 (*)    Total Protein 5.3 (*)    Albumin 1.9 (*)    Alkaline Phosphatase 156 (*)    GFR calc non Af Amer 33 (*)    GFR calc Af Amer 38 (*)    All other components within normal limits  CBC WITH DIFFERENTIAL/PLATELET - Abnormal; Notable for the following components:   RBC 3.21 (*)    Hemoglobin 9.4 (*)    HCT 31.3 (*)    RDW 17.1 (*)    Abs Immature Granulocytes 0.09 (*)    All other components within normal limits  PROTIME-INR - Abnormal; Notable for the following components:   Prothrombin Time 15.4 (*)    INR 1.3 (*)    All other components within normal limits  LACTIC ACID, PLASMA - Abnormal; Notable for the following components:   Lactic Acid, Venous 2.0 (*)    All other components within normal limits  LACTIC ACID, PLASMA  URINALYSIS, ROUTINE W REFLEX MICROSCOPIC  POC OCCULT BLOOD, ED  TYPE AND SCREEN  ABO/RH  TROPONIN I (HIGH SENSITIVITY)  TROPONIN I (HIGH SENSITIVITY)    EKG EKG Interpretation  Date/Time:  Tuesday January 25 2020 19:50:56 EDT Ventricular Rate:  95 PR Interval:    QRS Duration: 127 QT Interval:  448 QTC Calculation: 573 R Axis:   -178 Text Interpretation: Atrial fibrillation Nonspecific intraventricular conduction delay Nonspecific repol abnormality, inferior leads Confirmed by Kristine RoyalMessick, Elizibeth Breau [phone]([id number]) on 01/25/2020 7:52:22 PM   Radiology DG Chest Port 1 View  Result Date: 01/25/2020 CLINICAL DATA:  Weakness EXAM: PORTABLE CHEST 1 VIEW COMPARISON:  December 07, 2019 FINDINGS: There is unchanged moderate cardiomegaly. Aortic  knob calcifications are seen. Mildly increased interstitial markings seen throughout lungs however improved since the prior exam. A small bilateral effusions are seen, right greater than. IMPRESSION: Slight interval improvement in the interstitial edema. Small bilateral effusions, right greater. Electronically Signed   By: Jonna ClarkBindu  Avutu M.D.   On: 01/25/2020 20:14    Procedures Procedures (including critical care time)  Medications Ordered in ED Medications  sodium chloride 0.9 % bolus 500 mL (has no administration in time range)  0.9 %  sodium chloride infusion (has no administration in time range)    ED Course  I have reviewed the triage vital signs and the nursing notes.  Pertinent labs & imaging results that were available during my care of the patient were reviewed by me and considered in my medical decision making (see chart for details).    MDM Rules/Calculators/A&P                          MDM  Screen complete  Deneise Leveraron D Neils was evaluated in Emergency Department on 01/25/2020 for the symptoms described in the history of present illness. He was evaluated in the context of the global COVID-19 pandemic, which necessitated consideration that the patient might be at risk for infection with the SARS-CoV-2 virus that causes COVID-19. Institutional protocols and algorithms that pertain to the evaluation of patients at risk for COVID-19 are in a state of rapid change based on information released by regulatory bodies including the CDC and federal and state organizations. These policies and algorithms were followed during the patient's care in the ED.  Patient is presenting for evaluation of suspected lower GI bleed.  Bloody stool is present on rectal exam  Initial hemoglobin is 9.4 down  from 11.2 (69-month prior).  Patient without hemodynamic instability.  Patient does not appear to be on significant anticoagulation other than aspirin.  Patient will benefit from overnight  observation and further work-up.  Hospitalist service [facility]) is aware of case and will evaluate for admission.   Final Clinical Impression(s) / ED Diagnoses Final diagnoses:  Lower GI bleed    Rx / DC Orders ED Discharge Orders    None       Wynetta Fines, MD 01/25/20 2318

## 2020-01-25 NOTE — ED Triage Notes (Signed)
Pt BIB [facility] from [facility] (pt there for Rehab, EMS not sure why).  Staff noted rectal bleeding yesterday (01/24/2020). Staff noted same today as well. Staff non descriptive of stool.   Pt VSS stable on RA and pt has minimal complaints besides weakness.   No other complaints, Pt is A&Ox4. GCS 15.

## 2020-01-25 NOTE — ED Notes (Signed)
EDP made aware of elevated Lactic Acid; no further orders at this time.  

## 2020-01-26 ENCOUNTER — Observation Stay (HOSPITAL_COMMUNITY): Payer: Medicare Other

## 2020-01-26 ENCOUNTER — Encounter (HOSPITAL_COMMUNITY): Payer: Self-pay | Admitting: Family Medicine

## 2020-01-26 ENCOUNTER — Encounter (HOSPITAL_COMMUNITY): Payer: Medicare Other

## 2020-01-26 DIAGNOSIS — Z7189 Other specified counseling: Secondary | ICD-10-CM | POA: Diagnosis not present

## 2020-01-26 DIAGNOSIS — R54 Age-related physical debility: Secondary | ICD-10-CM | POA: Diagnosis present

## 2020-01-26 DIAGNOSIS — Z7401 Bed confinement status: Secondary | ICD-10-CM | POA: Diagnosis not present

## 2020-01-26 DIAGNOSIS — I708 Atherosclerosis of other arteries: Secondary | ICD-10-CM | POA: Diagnosis not present

## 2020-01-26 DIAGNOSIS — E785 Hyperlipidemia, unspecified: Secondary | ICD-10-CM | POA: Diagnosis not present

## 2020-01-26 DIAGNOSIS — D62 Acute posthemorrhagic anemia: Secondary | ICD-10-CM | POA: Diagnosis not present

## 2020-01-26 DIAGNOSIS — I5022 Chronic systolic (congestive) heart failure: Secondary | ICD-10-CM | POA: Diagnosis not present

## 2020-01-26 DIAGNOSIS — D500 Iron deficiency anemia secondary to blood loss (chronic): Secondary | ICD-10-CM | POA: Diagnosis not present

## 2020-01-26 DIAGNOSIS — I2510 Atherosclerotic heart disease of native coronary artery without angina pectoris: Secondary | ICD-10-CM | POA: Diagnosis not present

## 2020-01-26 DIAGNOSIS — Z87891 Personal history of nicotine dependence: Secondary | ICD-10-CM | POA: Diagnosis not present

## 2020-01-26 DIAGNOSIS — K922 Gastrointestinal hemorrhage, unspecified: Secondary | ICD-10-CM | POA: Diagnosis present

## 2020-01-26 DIAGNOSIS — N1831 Chronic kidney disease, stage 3a: Secondary | ICD-10-CM | POA: Diagnosis present

## 2020-01-26 DIAGNOSIS — Z6831 Body mass index (BMI) 31.0-31.9, adult: Secondary | ICD-10-CM | POA: Diagnosis not present

## 2020-01-26 DIAGNOSIS — I10 Essential (primary) hypertension: Secondary | ICD-10-CM | POA: Diagnosis not present

## 2020-01-26 DIAGNOSIS — K5730 Diverticulosis of large intestine without perforation or abscess without bleeding: Secondary | ICD-10-CM | POA: Diagnosis not present

## 2020-01-26 DIAGNOSIS — Z66 Do not resuscitate: Secondary | ICD-10-CM | POA: Diagnosis present

## 2020-01-26 DIAGNOSIS — M7989 Other specified soft tissue disorders: Secondary | ICD-10-CM

## 2020-01-26 DIAGNOSIS — Z955 Presence of coronary angioplasty implant and graft: Secondary | ICD-10-CM | POA: Diagnosis not present

## 2020-01-26 DIAGNOSIS — I499 Cardiac arrhythmia, unspecified: Secondary | ICD-10-CM | POA: Diagnosis not present

## 2020-01-26 DIAGNOSIS — G4733 Obstructive sleep apnea (adult) (pediatric): Secondary | ICD-10-CM | POA: Diagnosis present

## 2020-01-26 DIAGNOSIS — I701 Atherosclerosis of renal artery: Secondary | ICD-10-CM | POA: Diagnosis not present

## 2020-01-26 DIAGNOSIS — I700 Atherosclerosis of aorta: Secondary | ICD-10-CM | POA: Diagnosis not present

## 2020-01-26 DIAGNOSIS — K5900 Constipation, unspecified: Secondary | ICD-10-CM | POA: Diagnosis present

## 2020-01-26 DIAGNOSIS — Z8673 Personal history of transient ischemic attack (TIA), and cerebral infarction without residual deficits: Secondary | ICD-10-CM | POA: Diagnosis not present

## 2020-01-26 DIAGNOSIS — R58 Hemorrhage, not elsewhere classified: Secondary | ICD-10-CM | POA: Diagnosis not present

## 2020-01-26 DIAGNOSIS — I130 Hypertensive heart and chronic kidney disease with heart failure and stage 1 through stage 4 chronic kidney disease, or unspecified chronic kidney disease: Secondary | ICD-10-CM | POA: Diagnosis present

## 2020-01-26 DIAGNOSIS — R531 Weakness: Secondary | ICD-10-CM | POA: Diagnosis not present

## 2020-01-26 DIAGNOSIS — L89159 Pressure ulcer of sacral region, unspecified stage: Secondary | ICD-10-CM | POA: Diagnosis not present

## 2020-01-26 DIAGNOSIS — N5089 Other specified disorders of the male genital organs: Secondary | ICD-10-CM | POA: Diagnosis present

## 2020-01-26 DIAGNOSIS — Z20822 Contact with and (suspected) exposure to covid-19: Secondary | ICD-10-CM | POA: Diagnosis present

## 2020-01-26 DIAGNOSIS — I4891 Unspecified atrial fibrillation: Secondary | ICD-10-CM | POA: Diagnosis present

## 2020-01-26 DIAGNOSIS — K625 Hemorrhage of anus and rectum: Secondary | ICD-10-CM | POA: Diagnosis not present

## 2020-01-26 DIAGNOSIS — R609 Edema, unspecified: Secondary | ICD-10-CM | POA: Diagnosis not present

## 2020-01-26 DIAGNOSIS — E669 Obesity, unspecified: Secondary | ICD-10-CM | POA: Diagnosis present

## 2020-01-26 DIAGNOSIS — N179 Acute kidney failure, unspecified: Secondary | ICD-10-CM | POA: Diagnosis present

## 2020-01-26 DIAGNOSIS — M109 Gout, unspecified: Secondary | ICD-10-CM | POA: Diagnosis present

## 2020-01-26 DIAGNOSIS — Z743 Need for continuous supervision: Secondary | ICD-10-CM | POA: Diagnosis not present

## 2020-01-26 DIAGNOSIS — I959 Hypotension, unspecified: Secondary | ICD-10-CM | POA: Diagnosis present

## 2020-01-26 DIAGNOSIS — K5731 Diverticulosis of large intestine without perforation or abscess with bleeding: Secondary | ICD-10-CM | POA: Diagnosis present

## 2020-01-26 DIAGNOSIS — M2550 Pain in unspecified joint: Secondary | ICD-10-CM | POA: Diagnosis not present

## 2020-01-26 LAB — HEMOGLOBIN AND HEMATOCRIT, BLOOD
HCT: 27.3 % — ABNORMAL LOW (ref 39.0–52.0)
HCT: 28.5 % — ABNORMAL LOW (ref 39.0–52.0)
HCT: 28 % — ABNORMAL LOW (ref 39.0–52.0)
Hemoglobin: 7.8 g/dL — ABNORMAL LOW (ref 13.0–17.0)
Hemoglobin: 9.1 g/dL — ABNORMAL LOW (ref 13.0–17.0)
Hemoglobin: 9.1 g/dL — ABNORMAL LOW (ref 13.0–17.0)

## 2020-01-26 LAB — CBC
HCT: 30.6 % — ABNORMAL LOW (ref 39.0–52.0)
Hemoglobin: 9.2 g/dL — ABNORMAL LOW (ref 13.0–17.0)
MCH: 29.2 pg (ref 26.0–34.0)
MCHC: 30.1 g/dL (ref 30.0–36.0)
MCV: 97.1 fL (ref 80.0–100.0)
Platelets: 289 10*3/uL (ref 150–400)
RBC: 3.15 MIL/uL — ABNORMAL LOW (ref 4.22–5.81)
RDW: 17.2 % — ABNORMAL HIGH (ref 11.5–15.5)
WBC: 7.9 10*3/uL (ref 4.0–10.5)
nRBC: 0 % (ref 0.0–0.2)

## 2020-01-26 LAB — BASIC METABOLIC PANEL
Anion gap: 8 (ref 5–15)
BUN: 35 mg/dL — ABNORMAL HIGH (ref 8–23)
CO2: 23 mmol/L (ref 22–32)
Calcium: 7.9 mg/dL — ABNORMAL LOW (ref 8.9–10.3)
Chloride: 110 mmol/L (ref 98–111)
Creatinine, Ser: 1.75 mg/dL — ABNORMAL HIGH (ref 0.61–1.24)
GFR calc Af Amer: 43 mL/min — ABNORMAL LOW (ref >60)
GFR calc non Af Amer: 37 mL/min — ABNORMAL LOW (ref >60)
Glucose, Bld: 139 mg/dL — ABNORMAL HIGH (ref 70–99)
Potassium: 3.8 mmol/L (ref 3.5–5.1)
Sodium: 141 mmol/L (ref 135–145)

## 2020-01-26 LAB — MAGNESIUM: Magnesium: 2.2 mg/dL (ref 1.7–2.4)

## 2020-01-26 LAB — LACTIC ACID, PLASMA
Lactic Acid, Venous: 2.4 mmol/L (ref 0.5–1.9)
Lactic Acid, Venous: 2.1 mmol/L (ref 0.5–1.9)

## 2020-01-26 LAB — PREPARE RBC (CROSSMATCH)

## 2020-01-26 LAB — TROPONIN I (HIGH SENSITIVITY): Troponin I (High Sensitivity): 14 ng/L (ref <18)

## 2020-01-26 MED ORDER — LACTATED RINGERS IV BOLUS
500.0000 mL | Freq: Once | INTRAVENOUS | Status: AC
  Administered 2020-01-26: 500 mL via INTRAVENOUS

## 2020-01-26 MED ORDER — ALLOPURINOL 300 MG PO TABS
300.0000 mg | ORAL_TABLET | Freq: Every day | ORAL | Status: DC
  Administered 2020-01-27 – 2020-01-28 (×2): 300 mg via ORAL
  Filled 2020-01-26 (×3): qty 1

## 2020-01-26 MED ORDER — FUROSEMIDE 10 MG/ML IJ SOLN
20.0000 mg | Freq: Once | INTRAMUSCULAR | Status: AC
  Administered 2020-01-26: 20 mg via INTRAVENOUS
  Filled 2020-01-26: qty 2

## 2020-01-26 MED ORDER — DIPHENHYDRAMINE HCL 50 MG/ML IJ SOLN
25.0000 mg | Freq: Once | INTRAMUSCULAR | Status: AC
  Administered 2020-01-26: 25 mg via INTRAVENOUS
  Filled 2020-01-26: qty 1

## 2020-01-26 MED ORDER — HYDRALAZINE HCL 25 MG PO TABS
25.0000 mg | ORAL_TABLET | Freq: Two times a day (BID) | ORAL | Status: DC
  Administered 2020-01-27 – 2020-02-03 (×15): 25 mg via ORAL
  Filled 2020-01-26 (×17): qty 1

## 2020-01-26 MED ORDER — AMIODARONE HCL 200 MG PO TABS
200.0000 mg | ORAL_TABLET | Freq: Every day | ORAL | Status: DC
  Administered 2020-01-26 – 2020-02-03 (×9): 200 mg via ORAL
  Filled 2020-01-26 (×9): qty 1

## 2020-01-26 MED ORDER — ACETAMINOPHEN 325 MG PO TABS
650.0000 mg | ORAL_TABLET | Freq: Once | ORAL | Status: DC
  Filled 2020-01-26: qty 2

## 2020-01-26 MED ORDER — ISOSORBIDE MONONITRATE ER 30 MG PO TB24
30.0000 mg | ORAL_TABLET | Freq: Every day | ORAL | Status: DC
  Administered 2020-01-27 – 2020-02-03 (×7): 30 mg via ORAL
  Filled 2020-01-26 (×8): qty 1

## 2020-01-26 MED ORDER — CARVEDILOL 25 MG PO TABS
25.0000 mg | ORAL_TABLET | Freq: Two times a day (BID) | ORAL | Status: DC
  Administered 2020-01-27 – 2020-02-03 (×14): 25 mg via ORAL
  Filled 2020-01-26 (×17): qty 1

## 2020-01-26 MED ORDER — ALBUTEROL SULFATE HFA 108 (90 BASE) MCG/ACT IN AERS
2.0000 | INHALATION_SPRAY | RESPIRATORY_TRACT | Status: DC | PRN
  Filled 2020-01-26: qty 6.7

## 2020-01-26 MED ORDER — SODIUM CHLORIDE 0.9% IV SOLUTION: Freq: Once | INTRAVENOUS | Status: DC

## 2020-01-26 MED ORDER — IOHEXOL 350 MG/ML SOLN
80.0000 mL | Freq: Once | INTRAVENOUS | Status: AC | PRN
  Administered 2020-01-26: 80 mL via INTRAVENOUS

## 2020-01-26 MED ORDER — MONTELUKAST SODIUM 10 MG PO TABS
10.0000 mg | ORAL_TABLET | Freq: Every day | ORAL | Status: DC
  Administered 2020-01-27 – 2020-02-03 (×8): 10 mg via ORAL
  Filled 2020-01-26 (×10): qty 1

## 2020-01-26 MED ORDER — PANTOPRAZOLE SODIUM 40 MG IV SOLR
40.0000 mg | Freq: Two times a day (BID) | INTRAVENOUS | Status: DC
  Administered 2020-01-26 – 2020-01-27 (×4): 40 mg via INTRAVENOUS
  Filled 2020-01-26 (×4): qty 40

## 2020-01-26 NOTE — ED Notes (Signed)
Pt has had 2 bloody stools. Pt is passing clots.

## 2020-01-26 NOTE — Progress Notes (Addendum)
PROGRESS NOTE  Larry Lawrence GEX:[id number] DOB: 01/08/1941 DOA: 01/25/2020 PCP: [name], MD   LOS: 0 days   Brief narrative: As per HPI,  79 year old male with past medical history of congestive heart failure, coronary artery disease, gout, TIA presented to the hospital from skilled nursing facility with complaints of rectal bleeding.  No prior history of rectal bleed or colonoscopy.  He did have some constipation prior to coming in the hospital.  In the ED his hemoglobin was 9.4 with a two-point drop in his previous hemoglobin levels.   Guaiac done by the ER physician which shows a dark red blood.  He has occult positive stool.  Patient was then admitted to hospital for evaluation of GI bleed.  Assessment/Plan:  Principal Problem:   GI bleed Active Problems:   Chronic systolic CHF (congestive heart failure) (HCC)   CAD (coronary artery disease)   HTN (hypertension)   HLD (hyperlipidemia)   Anemia due to GI blood loss   GIB (gastrointestinal bleeding)   GI bleed with acute blood loss anemia. Continue IV Protonix.  Patient received Ringer lactate bolus for hypotension and ongoing GI bleed.  GI has been consulted overnight and has been informed by the night physician as well.  Awaiting for GI intervention/evaluation.  2 units of packed RBC will be transfused.  Will closely monitor hemodynamics.  CT angiogram of the abdomen did not show any evidence of acute GI bleed.  Diverticulosis noted.  Lactate of 2.4.  Latest hemoglobin of 7.8.  Continue H&H.  Follow GI recommendation.   Chronic systolic CHF  Last known last 2D echo 12/08/2019, EF 20 to 25%.  Will receive 2 units of packed RBC.  Will closely monitor.  Watch for volume overload.  Right upper extremity swelling Venous duplex negative for DVT.  Essential HTN  Hypotensive side,  Currently n.p.o.  hyperlipidemia -Stable, home meds resumed.  Currently n.p.o.  Chronic kidney disease Creatinine of 1.7.  We will  close monitor.  DVT prophylaxis: SCDs Start: 01/26/20 0123   Code Status: Full code  Family Communication: None at bedside  Status is: Observation  The patient will require care spanning > 2 midnights and should be moved to inpatient because: Unsafe d/c plan, IV treatments appropriate due to intensity of illness or inability to take PO, Inpatient level of care appropriate due to severity of illness and Ongoing GI bleeding needing GI evaluation, hemodynamically unstable  Dispo: The patient is from: Home              Anticipated d/c is to: Home              Anticipated d/c date is: 2 days              Patient currently is not medically stable to d/c.   Consultants:  GI   Procedures:  PRBC transfusion II units  Antibiotics:  . None  Anti-infectives (From admission, onward)   None       Subjective: Today, patient was seen and examined at bedside.  Patient denies any chest pain, shortness of breath, dizziness lightheadedness.  Denies abdominal pain, nausea vomiting.  Had overnight rectal bleed and GI was notified.  Objective: Vitals:   01/26/20 0802 01/26/20 0828  BP: (!) 89/52 (!) 84/58  Pulse:  86  Resp:  20  Temp:  (!) 96.5 F (35.8 C)  SpO2:  100%    Intake/Output Summary (Last 24 hours) at 01/26/2020 0903 Last data filed at 01/26/2020 [phone]  Gross per 24 hour  Intake 315 ml  Output --  Net 315 ml   Filed Weights   01/25/20 1946  Weight: (!) 95 kg   Body mass index is 29.21 kg/m.   Physical Exam: GENERAL: Patient is alert awake and oriented. Not in obvious distress. HENT: Pallor noted. Pupils equally reactive to light. Oral mucosa is moist NECK: is supple, no gross swelling noted. CHEST: Clear to auscultation. No crackles or wheezes.   CVS: S1 and S2 heard, no murmur. Regular rate and rhythm.  ABDOMEN: Soft, non-tender, bowel sounds are present.  Foley catheter in place. EXTREMITIES: Right upper extremity edema noted.  Nontender on palpation.   Bilateral lower extremities with Unna boot. CNS: Cranial nerves are intact. No focal motor deficits. SKIN: warm and dry without rashes.  Data Review: I have personally reviewed the following laboratory data and studies,  CBC: Recent Labs  Lab 01/25/20 2104 01/26/20 0256 01/26/20 0529  WBC 7.9 7.9  --   NEUTROABS 6.2  --   --   HGB 9.4* 9.2* 7.8*  HCT 31.3* 30.6* 27.3*  MCV 97.5 97.1  --   PLT 255 289  --    Basic Metabolic Panel: Recent Labs  Lab 01/25/20 2104 01/26/20 0256  NA 141 141  K 4.4 3.8  CL 109 110  CO2 23 23  GLUCOSE 188* 139*  BUN 38* 35*  CREATININE 1.93* 1.75*  CALCIUM 8.0* 7.9*  MG  --  2.2   Liver Function Tests: Recent Labs  Lab 01/25/20 2104  AST 23  ALT 17  ALKPHOS 156*  BILITOT 1.0  PROT 5.3*  ALBUMIN 1.9*   No results for input(s): LIPASE, AMYLASE in the last 168 hours. No results for input(s): AMMONIA in the last 168 hours. Cardiac Enzymes: No results for input(s): CKTOTAL, CKMB, CKMBINDEX, TROPONINI in the last 168 hours. BNP (last 3 results) Recent Labs    09/09/19 0320 12/07/19 0833  BNP 820.0* 2,150.1*    ProBNP (last 3 results) No results for input(s): PROBNP in the last 8760 hours.  CBG: No results for input(s): GLUCAP in the last 168 hours. No results found for this or any previous visit (from the past 240 hour(s)).   Studies: DG Chest Port 1 View  Result Date: 01/25/2020 CLINICAL DATA:  Weakness EXAM: PORTABLE CHEST 1 VIEW COMPARISON:  December 07, 2019 FINDINGS: There is unchanged moderate cardiomegaly. Aortic knob calcifications are seen. Mildly increased interstitial markings seen throughout lungs however improved since the prior exam. A small bilateral effusions are seen, right greater than. IMPRESSION: Slight interval improvement in the interstitial edema. Small bilateral effusions, right greater. Electronically Signed   By: Jonna Clark M.D.   On: 01/25/2020 20:14   CT Angio Abd/Pel w/ and/or w/o  Result Date:  01/26/2020 CLINICAL DATA:  79 year old with rectal bleeding. EXAM: CT ANGIOGRAPHY ABDOMEN AND PELVIS WITH CONTRAST AND WITHOUT CONTRAST TECHNIQUE: Multidetector CT imaging of the abdomen and pelvis was performed using the standard protocol during bolus administration of intravenous contrast. Multiplanar reconstructed images and MIPs were obtained and reviewed to evaluate the vascular anatomy. CONTRAST:  103mL OMNIPAQUE IOHEXOL 350 MG/ML SOLN COMPARISON:  CT abdomen 11/05/2009 FINDINGS: VASCULAR Aorta: Atherosclerotic calcifications in the abdominal aorta without aneurysm or dissection. Celiac: Celiac trunk is patent with scattered atherosclerotic calcifications. Extensive atherosclerotic calcifications involving the splenic artery. Main branches of the celiac trunk are patent. However, there is no significant contrast identified in the distal splenic artery and no significant enhancement of the  spleen even on the venous phase imaging. Findings are suggestive for marked arterial compromise to the spleen and distal splenic artery region. SMA: SMA is widely patent with atherosclerotic calcifications. Renals: Atherosclerotic calcifications involving bilateral renal arteries. Cannot exclude hemodynamically significant stenosis involving the origin the right renal artery. Less than 50% stenosis in left renal artery. IMA: Narrowing at the origin.  Patent. Inflow: Diffuse atherosclerotic calcifications in iliac arteries. Common and external iliac arteries are patent without significant stenosis. There appears to be stenosis involving the left internal iliac artery. Right internal iliac artery is patent. Proximal Outflow: Proximal femoral arteries are patent bilaterally with atherosclerotic calcifications. Veins: Main portal venous system is patent. No abnormality to the IVC or iliac veins. Renal veins are patent. Review of the MIP images confirms the above findings. NON-VASCULAR Lower chest: Bilateral pleural effusions,  right side greater than left. The right pleural effusion appears to be at least moderate in size. Compressive atelectasis in both lower lobes. Hepatobiliary: Multiple gallstones without gallbladder distention or inflammatory changes. Normal appearance of the liver. No biliary dilatation. Pancreas: Unremarkable. No pancreatic ductal dilatation or surrounding inflammatory changes. Spleen: Spleen size is normal. No significant enhancement in the spleen even on the delayed images. Adrenals/Urinary Tract: Adrenal glands are unremarkable. Cortical scarring in the posterior left kidney. Negative for hydronephrosis. Small amount of fluid in the urinary bladder. Stomach/Bowel: High-density stool in the colon on the noncontrast image limits evaluation for GI bleeding. No clear evidence for active GI bleeding on this examination. No acute inflammatory changes involving the appendix. Stomach and duodenum are unremarkable. No evidence for bowel dilatation or focal bowel inflammation. Diverticulosis involving the sigmoid colon. Lymphatic: No significant lymph node enlargement in the abdomen or pelvis. Reproductive: Prostate is unremarkable. Other: Extensive subcutaneous edema in the abdomen and pelvis. Presacral edema. No ascites. Negative for free air. Musculoskeletal: No acute bone abnormality. IMPRESSION: VASCULAR 1. No evidence for active GI bleeding. 2. Diffuse atherosclerotic disease involving the aorta, visceral arteries and iliac arteries. Aortic Atherosclerosis (ICD10-I70.0). 3. 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 Concern for a distal occlusion of the splenic artery or markedly decreased flow. 4. Right renal artery stenosis could be hemodynamically significant. NON-VASCULAR 1. Colonic diverticulosis. No evidence for acute bowel inflammation. 2. Extensive subcutaneous edema with bilateral pleural effusions. Findings are  suggestive for fluid overload state. 3. Cholelithiasis. Electronically Signed   By: Richarda Overlie M.D.   On: 01/26/2020 08:40      Joycelyn Das, MD  [facility] 01/26/2020

## 2020-01-26 NOTE — ED Notes (Signed)
[phone] tammy daughter would like an update

## 2020-01-26 NOTE — ED Notes (Signed)
Provider paged.

## 2020-01-26 NOTE — Progress Notes (Signed)
°   01/26/20 1544  Assess: MEWS Score  Temp 97.6 F (36.4 C)  BP (!) 100/58  ECG Heart Rate (!) 113  Resp 16  Level of Consciousness Alert  SpO2 100 %  O2 Device Room Air  Assess: MEWS Score  MEWS Temp 0  MEWS Systolic 1  MEWS Pulse 2  MEWS RR 0  MEWS LOC 0  MEWS Score 3  MEWS Score Color Yellow  Assess: if the MEWS score is Yellow or Red  Were vital signs taken at a resting state? Yes  Focused Assessment No change from prior assessment  Early Detection of Sepsis Score *See Row Information* Low  MEWS guidelines implemented *See Row Information* Yes  Take Vital Signs  Increase Vital Sign Frequency  Yellow: Q 2hr X 2 then Q 4hr X 2, if remains yellow, continue Q 4hrs  Escalate  MEWS: Escalate Yellow: discuss with charge nurse/RN and consider discussing with provider and RRT  Notify: Charge Nurse/RN  Name of Charge Nurse/RN Notified Dannis Deroche  Date Charge Nurse/RN Notified 01/26/20  Time Charge Nurse/RN Notified 1800  Document  Patient Outcome Other (Comment) (no interventions)  Progress note created (see row info) Yes

## 2020-01-26 NOTE — ED Notes (Signed)
Spoke with the provider regarding pt drop in BP. Verbal order received 500cc LR bolus.

## 2020-01-26 NOTE — Progress Notes (Addendum)
Called by RN that patient with copious amount of bright red blood per rectum.  He has filled 3 bedpan's according to RN. Blood pressure has dropped to 71/40. Went to bedside and evaluated patient.  He denies any abdominal pain.  Denies any chest pain or shortness of breath. Hemoglobin 30 minutes ago was repeated and was 7.8 which is decreased from 9.2.  Patient has been given a 500 LR bolus to lower blood pressure earlier. Reviewed H&P.  GI was consulted by the ER doctor last night and is scheduled to see patient this morning.  Cardiovascular-sinus tachycardia.  No murmur rub or gallop. Respiratory- Clear bilaterally.  No wheezing or rhonchi Abdomen-soft nondistended nontender to palpation.  No masses.  Rebound or guarding  Transfuse 2 units of packed red blood cells now. Order serial H&H Continue IV Protonix 500 ml LR bolus provided.  GI has been consulted and will see patient this morning for H&P. Paged GI to update them on pt condition.  Cussed with Dr. Marina Goodell who agrees with PRBC and LR bolus.  Recommends CTA of abdomen and pelvis to evaluate for diverticular bleed that may need IR embolization Transfer to progressive care with continued rectal bleeding and hypotension.

## 2020-01-26 NOTE — ED Notes (Signed)
Dr Rachael Darby made aware patient is hypotensive and continues to saturate brief with blood and blood clots. Level of care to be changed to progressive and blood products to be ordered

## 2020-01-26 NOTE — Consult Note (Signed)
Referring Provider:  EDP Primary Care Physician:  [name], MD Primary Gastroenterologist:  None   Reason for Consultation:  GI bleed  HPI: Larry Lawrence is a 79 y.o. male with PMH of CHF with EF of 20-25% on most recent ECHO, CAD s/p stents remotely.  Presented to [facility] hospital from rehab facility for GI bleeding.  Was at rehab because he had fallen and hurt his knee.  Bleeding began on the evening of 7/26 but continued yesterday and worsened so he was brought to the ED.  He says that he has never had a colonoscopy in the past.  No history of GI bleeding.  Takes ASA 81 mg daily but otherwise no blood thinning agents.  In the ED he was hypotensive to 65/54.  Hgb baseline is in the 11 gram range.  He already received 2 units PRBCs and Hgb is 9.2 grams this AM.  Bleeding became quite profuse overnight and into this AM.  CTA was performed and showed the following:  IMPRESSION: VASCULAR  1. No evidence for active GI bleeding. 2. Diffuse atherosclerotic disease involving the aorta, visceral arteries and iliac arteries. Aortic Atherosclerosis (ICD10-I70.0). 3. 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 Concern for a distal occlusion of the splenic artery or markedly decreased flow. 4. Right renal artery stenosis could be hemodynamically significant.  NON-VASCULAR  1. Colonic diverticulosis. No evidence for acute bowel inflammation. 2. Extensive subcutaneous edema with bilateral pleural effusions.  Findings are suggestive for fluid overload state. 3. Cholelithiasis.  Bleeding seems to be slowing now per the patient and his nurse.  He denies any abdominal pain.  Says that his appetite has not been great recently because he does not like the food at the rehab facility.  No nausea or vomiting.   Past Medical History:  Diagnosis Date  . Allergy   . CHF (congestive heart failure) (HCC)    . Coronary artery disease   . Gout   . Insomnia   . Renal disorder   . Sleeps in sitting position due to orthopnea   . TIA (transient ischemic attack)     Past Surgical History:  Procedure Laterality Date  . CARDIAC CATHETERIZATION N/A 02/02/2016   Procedure: Left Heart Cath and Coronary Angiography;  Surgeon: Lamar Blinks, MD;  Location: [facility] INVASIVE CV LAB;  Service: Cardiovascular;  Laterality: N/A;  . CARDIAC SURGERY     3 stents in "main artery"  . JOINT REPLACEMENT      Prior to Admission medications   Medication Sig Start Date End Date Taking? Authorizing Provider  acetaminophen (TYLENOL) 500 MG tablet Take 1,000 mg by mouth every 8 (eight) hours.   Yes [provider]  albuterol (VENTOLIN HFA) 108 (90 Base) MCG/ACT inhaler Inhale 2 puffs into the lungs in the morning, at noon, and at bedtime.   Yes [provider]  allopurinol (ZYLOPRIM) 300 MG tablet TAKE (1) TABLET BY MOUTH a day Patient taking differently: Take 300 mg by mouth daily.  06/09/19  Yes Duanne Limerick, MD  amiodarone (PACERONE) 200 MG tablet Take 200 mg by mouth daily. Dr Suzzanne Cloud, MD  aspirin 81 MG EC tablet Take 81 mg by mouth daily.    Yes [provider]  carvedilol (COREG) 12.5 MG tablet Take 2 tablets (25 mg total) by mouth 2 (two) times daily. Dr Gwen Pounds 09/13/19 01/24/29 Yes Enedina Finner,  MD  hydrALAZINE (APRESOLINE) 25 MG tablet Take 25 mg by mouth 2 (two) times daily.   Yes [provider]  isosorbide mononitrate (IMDUR) 30 MG 24 hr tablet Take 30 mg by mouth daily. kowalski   Yes [provider]  Lidocaine (ASPERCREME LIDOCAINE) 4 % PTCH Apply 1 patch topically daily as needed (to both knees).   Yes [provider]  melatonin 5 MG TABS Take 5 mg by mouth at bedtime.   Yes [provider]  montelukast (SINGULAIR) 10 MG tablet TAKE (1) TABLET BY MOUTH DAILY AT BEDTIME Patient taking differently: Take 10 mg by mouth  at bedtime.  06/09/19  Yes Duanne Limerick, MD  polyethylene glycol (MIRALAX / GLYCOLAX) 17 g packet Take 17 g by mouth daily.   Yes [provider]  Potassium Chloride ER 20 MEQ TBCR Take 20 mEq by mouth daily.    Yes [provider]  timolol (TIMOPTIC) 0.5 % ophthalmic solution Place 1 drop into both eyes 2 (two) times daily.   Yes [provider]  torsemide (DEMADEX) 20 MG tablet Take 20 mg by mouth daily.   Yes [provider]  ascorbic acid (VITAMIN C) 500 MG tablet Take 1 tablet (500 mg total) by mouth daily. Patient not taking: Reported on 01/25/2020 12/15/19   Delfino Lovett, MD  zinc sulfate 220 (50 Zn) MG capsule Take 1 capsule (220 mg total) by mouth daily. Patient not taking: Reported on 01/25/2020 12/15/19   Delfino Lovett, MD    Current Facility-Administered Medications  Medication Dose Route Frequency Provider Last Rate Last Admin  . 0.9 %  sodium chloride infusion (Manually program via Guardrails IV Fluids)   Intravenous Once Chotiner, Claudean Severance, MD      . acetaminophen (TYLENOL) tablet 650 mg  650 mg Oral Once Chotiner, Claudean Severance, MD      . albuterol (VENTOLIN HFA) 108 (90 Base) MCG/ACT inhaler 2 puff  2 puff Inhalation Q4H PRN Crosley, Debby, MD      . allopurinol (ZYLOPRIM) tablet 300 mg  300 mg Oral Daily Crosley, Debby, MD      . amiodarone (PACERONE) tablet 200 mg  200 mg Oral Daily Crosley, Debby, MD   200 mg at 01/26/20 1249  . carvedilol (COREG) tablet 25 mg  25 mg Oral BID Crosley, Debby, MD      . hydrALAZINE (APRESOLINE) tablet 25 mg  25 mg Oral BID Crosley, Debby, MD      . isosorbide mononitrate (IMDUR) 24 hr tablet 30 mg  30 mg Oral Daily Crosley, Debby, MD      . montelukast (SINGULAIR) tablet 10 mg  10 mg Oral QHS Crosley, Debby, MD      . pantoprazole (PROTONIX) injection 40 mg  40 mg Intravenous Q12H Crosley, Debby, MD   40 mg at 01/26/20 0930   Current Outpatient Medications  Medication Sig Dispense Refill  . acetaminophen  (TYLENOL) 500 MG tablet Take 1,000 mg by mouth every 8 (eight) hours.    Marland Kitchen albuterol (VENTOLIN HFA) 108 (90 Base) MCG/ACT inhaler Inhale 2 puffs into the lungs in the morning, at noon, and at bedtime.    Marland Kitchen allopurinol (ZYLOPRIM) 300 MG tablet TAKE (1) TABLET BY MOUTH a day (Patient taking differently: Take 300 mg by mouth daily. ) 90 tablet 1  . amiodarone (PACERONE) 200 MG tablet Take 200 mg by mouth daily. Dr Gwen Pounds    . aspirin 81 MG EC tablet Take 81 mg by mouth daily.     Marland Kitchen  carvedilol (COREG) 12.5 MG tablet Take 2 tablets (25 mg total) by mouth 2 (two) times daily. Dr Gwen Pounds 60 tablet 0  . hydrALAZINE (APRESOLINE) 25 MG tablet Take 25 mg by mouth 2 (two) times daily.    . isosorbide mononitrate (IMDUR) 30 MG 24 hr tablet Take 30 mg by mouth daily. kowalski    . Lidocaine (ASPERCREME LIDOCAINE) 4 % PTCH Apply 1 patch topically daily as needed (to both knees).    . melatonin 5 MG TABS Take 5 mg by mouth at bedtime.    . montelukast (SINGULAIR) 10 MG tablet TAKE (1) TABLET BY MOUTH DAILY AT BEDTIME (Patient taking differently: Take 10 mg by mouth at bedtime. ) 90 tablet 3  . polyethylene glycol (MIRALAX / GLYCOLAX) 17 g packet Take 17 g by mouth daily.    . Potassium Chloride ER 20 MEQ TBCR Take 20 mEq by mouth daily.     . timolol (TIMOPTIC) 0.5 % ophthalmic solution Place 1 drop into both eyes 2 (two) times daily.    Marland Kitchen torsemide (DEMADEX) 20 MG tablet Take 20 mg by mouth daily.    Marland Kitchen ascorbic acid (VITAMIN C) 500 MG tablet Take 1 tablet (500 mg total) by mouth daily. (Patient not taking: Reported on 01/25/2020) 30 tablet 0  . zinc sulfate 220 (50 Zn) MG capsule Take 1 capsule (220 mg total) by mouth daily. (Patient not taking: Reported on 01/25/2020) 30 capsule 0    Allergies as of 01/25/2020 - Review Complete 01/25/2020  Allergen Reaction Noted  . Niaspan [niacin er] Other (See Comments) 02/20/2015    Family History  Problem Relation Age of Onset  . Hypertension Mother   . Pancreatic  cancer Mother   . Hypertension Father   . Prostate cancer Father     Social History   Socioeconomic History  . Marital status: Widowed    Spouse name: Not on file  . Number of children: 2  . Years of education: Not on file  . Highest education level: 12th grade  Occupational History  . Occupation: retired  Tobacco Use  . Smoking status: Former Smoker    Packs/day: 2.00    Years: 3.00    Pack years: 6.00    Types: Cigarettes    Quit date: 1960    Years since quitting: 61.6  . Smokeless tobacco: Never Used  . Tobacco comment: Smoking cessation materials not required  Vaping Use  . Vaping Use: Never used  Substance and Sexual Activity  . Alcohol use: No  . Drug use: No  . Sexual activity: Not Currently  Other Topics Concern  . Not on file  Social History Narrative  . Not on file   Social Determinants of Health   Financial Resource Strain:   . Difficulty of Paying Living Expenses:   Food Insecurity:   . Worried About Programme researcher, broadcasting/film/video in the Last Year:   . Barista in the Last Year:   Transportation Needs:   . Freight forwarder (Medical):   Marland Kitchen Lack of Transportation (Non-Medical):   Physical Activity:   . Days of Exercise per Week:   . Minutes of Exercise per Session:   Stress:   . Feeling of Stress :   Social Connections:   . Frequency of Communication with Friends and Family:   . Frequency of Social Gatherings with Friends and Family:   . Attends Religious Services:   . Active Member of Clubs or Organizations:   . Attends  Club or Organization Meetings:   Marland Kitchen Marital Status:   Intimate Partner Violence:   . Fear of Current or Ex-Partner:   . Emotionally Abused:   Marland Kitchen Physically Abused:   . Sexually Abused:     Review of Systems: ROS is O/W negative except as mentioned in HPI.  Physical Exam: Vital signs in last 24 hours: Temp:  [96.5 F (35.8 C)-97.8 F (36.6 C)] 97.6 F (36.4 C) (07/28 1234) Pulse Rate:  [69-105] 98 (07/28 1234) Resp:   [16-23] 18 (07/28 1234) BP: (65-126)/(40-98) 100/83 (07/28 1234) SpO2:  [96 %-100 %] 100 % (07/28 0941) Weight:  [95 kg] 95 kg (07/27 1946)   General:  Alert, Well-developed, well-nourished, pleasant and cooperative in NAD Head:  Normocephalic and atraumatic. Eyes:  Sclera clear, no icterus.  Conjunctiva pink. Ears:  Normal auditory acuity. Mouth:  No deformity or lesions.   Lungs:  Clear throughout to auscultation.  No wheezes, crackles, or rhonchi.  Heart:  Regular rate and rhythm; no murmurs, clicks, rubs, or gallops. Abdomen:  Soft, non-distended.  BS present.  Non-tender.   Msk:  Symmetrical without gross deformities. Pulses:  Normal pulses noted. Extremities:  Edema in B/L LEs noted, but they were both wrapped extensively. Neurologic:  Alert and oriented x 4;  grossly normal neurologically. Skin:  Intact without significant lesions or rashes. Psych:  Alert and cooperative. Normal mood and affect.  Intake/Output this shift: Total I/O In: 630 [Blood:630] Out: -   Lab Results: Recent Labs    01/25/20 2104 01/26/20 0256 01/26/20 0529  WBC 7.9 7.9  --   HGB 9.4* 9.2* 7.8*  HCT 31.3* 30.6* 27.3*  PLT 255 289  --    BMET Recent Labs    01/25/20 2104 01/26/20 0256  NA 141 141  K 4.4 3.8  CL 109 110  CO2 23 23  GLUCOSE 188* 139*  BUN 38* 35*  CREATININE 1.93* 1.75*  CALCIUM 8.0* 7.9*   LFT Recent Labs    01/25/20 2104  PROT 5.3*  ALBUMIN 1.9*  AST 23  ALT 17  ALKPHOS 156*  BILITOT 1.0   PT/INR Recent Labs    01/25/20 2104  LABPROT 15.4*  INR 1.3*   Studies/Results: DG Chest Port 1 View  Result Date: 01/25/2020 CLINICAL DATA:  Weakness EXAM: PORTABLE CHEST 1 VIEW COMPARISON:  December 07, 2019 FINDINGS: There is unchanged moderate cardiomegaly. Aortic knob calcifications are seen. Mildly increased interstitial markings seen throughout lungs however improved since the prior exam. A small bilateral effusions are seen, right greater than. IMPRESSION: Slight  interval improvement in the interstitial edema. Small bilateral effusions, right greater. Electronically Signed   By: Jonna Clark M.D.   On: 01/25/2020 20:14   VAS [place] LOWER EXTREMITY VENOUS (DVT)  Result Date: 01/26/2020  Lower Venous DVTStudy Indications: Swelling, and Edema.  Limitations: Bandages. Comparison Study: no prior Performing Technologist: Blanch Media RVS  Examination Guidelines: A complete evaluation includes B-mode imaging, spectral Doppler, color Doppler, and power Doppler as needed of all accessible portions of each vessel. Bilateral testing is considered an integral part of a complete examination. Limited examinations for reoccurring indications may be performed as noted. The reflux portion of the exam is performed with the patient in reverse Trendelenburg.  +---------+---------------+---------+-----------+----------+-------------------+ RIGHT    CompressibilityPhasicitySpontaneityPropertiesThrombus Aging      +---------+---------------+---------+-----------+----------+-------------------+ CFV      Full           Yes      Yes                                      +---------+---------------+---------+-----------+----------+-------------------+  SFJ      Full                                                             +---------+---------------+---------+-----------+----------+-------------------+ FV Prox  Full                                                             +---------+---------------+---------+-----------+----------+-------------------+ FV Mid   Full                                                             +---------+---------------+---------+-----------+----------+-------------------+ FV DistalFull                                                             +---------+---------------+---------+-----------+----------+-------------------+ PFV      Full                                                              +---------+---------------+---------+-----------+----------+-------------------+ POP      Full           Yes      Yes                                      +---------+---------------+---------+-----------+----------+-------------------+ PTV                                                   not visualized due                                                        to bandaging        +---------+---------------+---------+-----------+----------+-------------------+ PERO                                                  not visualized due  to bandaging        +---------+---------------+---------+-----------+----------+-------------------+   +----+---------------+---------+-----------+----------+--------------+ LEFTCompressibilityPhasicitySpontaneityPropertiesThrombus Aging +----+---------------+---------+-----------+----------+--------------+ CFV                                              Not visualized +----+---------------+---------+-----------+----------+--------------+     Summary: RIGHT: - There is no evidence of deep vein thrombosis in the lower extremity.  - No cystic structure found in the popliteal fossa.   *See table(s) above for measurements and observations.    Preliminary    CT Angio Abd/Pel w/ and/or w/o  Result Date: 01/26/2020 CLINICAL DATA:  79 year old with rectal bleeding. EXAM: CT ANGIOGRAPHY ABDOMEN AND PELVIS WITH CONTRAST AND WITHOUT CONTRAST TECHNIQUE: Multidetector CT imaging of the abdomen and pelvis was performed using the standard protocol during bolus administration of intravenous contrast. Multiplanar reconstructed images and MIPs were obtained and reviewed to evaluate the vascular anatomy. CONTRAST:  80mL OMNIPAQUE IOHEXOL 350 MG/ML SOLN COMPARISON:  CT abdomen 11/05/2009 FINDINGS: VASCULAR Aorta: Atherosclerotic calcifications in the abdominal aorta without aneurysm or dissection.  Celiac: Celiac trunk is patent with scattered atherosclerotic calcifications. Extensive atherosclerotic calcifications involving the splenic artery. Main branches of the celiac trunk are patent. However, there is no significant contrast identified in the distal splenic artery and no significant enhancement of the spleen even on the venous phase imaging. Findings are suggestive for marked arterial compromise to the spleen and distal splenic artery region. SMA: SMA is widely patent with atherosclerotic calcifications. Renals: Atherosclerotic calcifications involving bilateral renal arteries. Cannot exclude hemodynamically significant stenosis involving the origin the right renal artery. Less than 50% stenosis in left renal artery. IMA: Narrowing at the origin.  Patent. Inflow: Diffuse atherosclerotic calcifications in iliac arteries. Common and external iliac arteries are patent without significant stenosis. There appears to be stenosis involving the left internal iliac artery. Right internal iliac artery is patent. Proximal Outflow: Proximal femoral arteries are patent bilaterally with atherosclerotic calcifications. Veins: Main portal venous system is patent. No abnormality to the IVC or iliac veins. Renal veins are patent. Review of the MIP images confirms the above findings. NON-VASCULAR Lower chest: Bilateral pleural effusions, right side greater than left. The right pleural effusion appears to be at least moderate in size. Compressive atelectasis in both lower lobes. Hepatobiliary: Multiple gallstones without gallbladder distention or inflammatory changes. Normal appearance of the liver. No biliary dilatation. Pancreas: Unremarkable. No pancreatic ductal dilatation or surrounding inflammatory changes. Spleen: Spleen size is normal. No significant enhancement in the spleen even on the delayed images. Adrenals/Urinary Tract: Adrenal glands are unremarkable. Cortical scarring in the posterior left kidney. Negative  for hydronephrosis. Small amount of fluid in the urinary bladder. Stomach/Bowel: High-density stool in the colon on the noncontrast image limits evaluation for GI bleeding. No clear evidence for active GI bleeding on this examination. No acute inflammatory changes involving the appendix. Stomach and duodenum are unremarkable. No evidence for bowel dilatation or focal bowel inflammation. Diverticulosis involving the sigmoid colon. Lymphatic: No significant lymph node enlargement in the abdomen or pelvis. Reproductive: Prostate is unremarkable. Other: Extensive subcutaneous edema in the abdomen and pelvis. Presacral edema. No ascites. Negative for free air. Musculoskeletal: No acute bone abnormality. IMPRESSION: VASCULAR 1. No evidence for active GI bleeding. 2. Diffuse atherosclerotic disease involving the aorta, visceral arteries and iliac arteries. Aortic Atherosclerosis (ICD10-I70.0). 3. 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 Concern for a distal occlusion of the splenic artery or markedly decreased flow. 4. Right renal artery stenosis could be hemodynamically significant. NON-VASCULAR 1. Colonic diverticulosis. No evidence for acute bowel inflammation. 2. Extensive subcutaneous edema with bilateral pleural effusions. Findings are suggestive for fluid overload state. 3. Cholelithiasis. Electronically Signed   By: Richarda Overlie M.D.   On: 01/26/2020 08:40    IMPRESSION:  *LGIB:  Large volume with hypotension upon initial evaluation that has improved with resuscitation.  Suspect diverticular bleed.  CTA negative for source, but did show diverticular disease.  Patient has never had colonoscopy so cannot definitively rule out malignancy, but CT scan not suggestive of such.  Seems like bleeding has slowed for now. *ABLA:  Secondary to above.  Hgb baseline in 11 gram range.  Down to 9.2 grams today even after 2 units  PRBCs. *Chronic systolic CHF:  Last known last 2D echo 12/08/2019, EF 20 to 25%.  PLAN: -Plan to observe for now.  Monitor CBC and transfuse further prn.  Hopefully bleeding will resolve spontaneously. -I spoke with his daughter, [name], extensively on the phone while in the room with the patient.  Princella Pellegrini. Teauna Dubach  01/26/2020, 1:22 PM

## 2020-01-26 NOTE — Progress Notes (Signed)
Lower extremity venous has been completed.   Preliminary results in CV Proc.   Blanch Media 01/26/2020 10:05 AM

## 2020-01-26 NOTE — Progress Notes (Signed)
Upper extremity venous has been completed.   Preliminary results in CV Proc.   Blanch Media 01/26/2020 2:45 PM

## 2020-01-27 DIAGNOSIS — D62 Acute posthemorrhagic anemia: Secondary | ICD-10-CM

## 2020-01-27 LAB — CEA: CEA: 4.2 ng/mL (ref 0.0–4.7)

## 2020-01-27 LAB — BASIC METABOLIC PANEL
Anion gap: 7 (ref 5–15)
BUN: 37 mg/dL — ABNORMAL HIGH (ref 8–23)
CO2: 24 mmol/L (ref 22–32)
Calcium: 7.7 mg/dL — ABNORMAL LOW (ref 8.9–10.3)
Chloride: 111 mmol/L (ref 98–111)
Creatinine, Ser: 1.83 mg/dL — ABNORMAL HIGH (ref 0.61–1.24)
GFR calc Af Amer: 40 mL/min — ABNORMAL LOW (ref >60)
GFR calc non Af Amer: 35 mL/min — ABNORMAL LOW (ref >60)
Glucose, Bld: 110 mg/dL — ABNORMAL HIGH (ref 70–99)
Potassium: 4.2 mmol/L (ref 3.5–5.1)
Sodium: 142 mmol/L (ref 135–145)

## 2020-01-27 LAB — CBC
HCT: 25.4 % — ABNORMAL LOW (ref 39.0–52.0)
Hemoglobin: 8.1 g/dL — ABNORMAL LOW (ref 13.0–17.0)
MCH: 29.8 pg (ref 26.0–34.0)
MCHC: 31.9 g/dL (ref 30.0–36.0)
MCV: 93.4 fL (ref 80.0–100.0)
Platelets: 228 10*3/uL (ref 150–400)
RBC: 2.72 MIL/uL — ABNORMAL LOW (ref 4.22–5.81)
RDW: 17.1 % — ABNORMAL HIGH (ref 11.5–15.5)
WBC: 7.8 10*3/uL (ref 4.0–10.5)
nRBC: 0.3 % — ABNORMAL HIGH (ref 0.0–0.2)

## 2020-01-27 LAB — BPAM RBC
Blood Product Expiration Date: 202108222359
Blood Product Expiration Date: 202108232359
ISSUE DATE / TIME: 202107280610
ISSUE DATE / TIME: 202107280912
Unit Type and Rh: 6200
Unit Type and Rh: 6200

## 2020-01-27 LAB — TYPE AND SCREEN
ABO/RH(D): A POS
Antibody Screen: NEGATIVE
Unit division: 0
Unit division: 0

## 2020-01-27 LAB — HEMOGLOBIN AND HEMATOCRIT, BLOOD
HCT: 26.4 % — ABNORMAL LOW (ref 39.0–52.0)
Hemoglobin: 8.5 g/dL — ABNORMAL LOW (ref 13.0–17.0)

## 2020-01-27 LAB — PHOSPHORUS: Phosphorus: 3.7 mg/dL (ref 2.5–4.6)

## 2020-01-27 LAB — MAGNESIUM: Magnesium: 2.1 mg/dL (ref 1.7–2.4)

## 2020-01-27 LAB — LACTIC ACID, PLASMA: Lactic Acid, Venous: 1.6 mmol/L (ref 0.5–1.9)

## 2020-01-27 NOTE — Progress Notes (Addendum)
PROGRESS NOTE  Larry Lawrence:[id number] DOB: August 11, 1940 DOA: 01/25/2020 PCP: [name], MD   LOS: 1 day   Brief narrative: As per HPI,  79 year old male with past medical history of congestive heart failure, coronary artery disease, gout, TIA presented to the hospital from skilled nursing facility with complaints of rectal bleeding.  No prior history of rectal bleed or colonoscopy.  He did have some constipation prior to coming in the hospital.  In the ED, his hemoglobin was 9.4 with a two-point drop from his previous hemoglobin levels.   Guaiac done by the ER physician which showed dark red blood.  He has occult positive stool.  Patient was then admitted to hospital for evaluation of GI bleed.  Assessment/Plan:  Principal Problem:   GI bleed Active Problems:   Chronic systolic CHF (congestive heart failure) (HCC)   CAD (coronary artery disease)   HTN (hypertension)   HLD (hyperlipidemia)   Anemia due to GI blood loss   GIB (gastrointestinal bleeding)   GI bleed with acute blood loss anemia. Likely diverticular bleed.  Continue IV Protonix.  GI on board.  Status post 2 units of packed RBC . CT angiogram of the abdomen did not show any evidence of acute GI bleed.  Diverticulosis noted.  Lactate of 2.4.  Latest hemoglobin of 8.1 after transfusion 2 units of RBC, downtrend from 9.1  Continue H&H.  GI on board.  No plans for intervention support.  Will monitor conservatively. Will closely monitor   Chronic systolic CHF  Last known last 2D echo 12/08/2019, EF 20 to 25%.  Status post 2 units of packed RBC.  Will closely monitor.  Watch for volume overload.  Right upper extremity swelling Venous duplex negative for DVT.  Essential HTN  Blood pressure on the hypotensive side,  Currently n.p.o. hold antihypertensive medications for parameters  hyperlipidemia   Currently on clears  Mild AKI on Chronic kidney disease IIa Creatinine of 1.8 today from 1.7.  We will close  monitor.  Avoid diuretics for now.  Debility deconditioning.  Patient is from [facility] assisted living facility.  Will get PT evaluation.  Consult transition of care.  DVT prophylaxis: SCDs Start: 01/26/20 0123   Code Status: Full code  Family Communication: I spoke with the patient's daughter Spoelstra on the phone and updated her about the clinical condition of the patient and the plan for disposition to assisted living tomorrow if patient remains stable.  None at bedside  Status is: inpatient  The patient is inpatient because: Unsafe d/c plan, IV treatments appropriate due to intensity of illness or inability to take PO, Inpatient level of care appropriate due to severity of illness a  Dispo: The patient is from: [facility].              Anticipated d/c is to: Assisted living facility              Anticipated d/c date is: 1 day if no further bleeding and hemoglobin remained stable.              Patient currently is not medically stable to d/c.  Consultants:  GI   Procedures:  PRBC transfusion II units  Antibiotics:  . None  Anti-infectives (From admission, onward)   None      Subjective: Today, patient was seen and examined at bedside.  Patient denied any bowel movements or rectal bleed.  Denies any shortness of breath, cough, fever.    Objective:  Vitals:   01/27/20 0511 01/27/20 0815  BP: 110/66 103/77  Pulse: (!) 108 88  Resp: 16 16  Temp: 98 F (36.7 C) 98.1 F (36.7 C)  SpO2: 100% 100%    Intake/Output Summary (Last 24 hours) at 01/27/2020 0833 Last data filed at 01/26/2020 2000 Gross per 24 hour  Intake 675 ml  Output 400 ml  Net 275 ml   Filed Weights   01/25/20 1946 01/27/20 0511  Weight: (!) 95 kg (!) 98.2 kg   Body mass index is 30.19 kg/m.   Physical Exam GENERAL: Patient is alert awake and oriented. Not in obvious distress. HENT: Mild pallor noted. Pupils equally reactive to light. Oral mucosa is  moist NECK: is supple, no gross swelling noted. CHEST: Clear to auscultation. No crackles or wheezes.   CVS: S1 and S2 heard, no murmur. Regular rate and rhythm.  ABDOMEN: Soft, non-tender, bowel sounds are present.  External urinary catheter in place. EXTREMITIES: Right upper extremity edema noted.  Nontender on palpation.  Bilateral lower extremities with Unna boot. CNS: Cranial nerves are intact. No focal motor deficits. SKIN: warm and dry without rashes.  Data Review: I have personally reviewed the following laboratory data and studies,  CBC: Recent Labs  Lab 01/25/20 2104 01/25/20 2104 01/26/20 0256 01/26/20 0529 01/26/20 1421 01/26/20 1659 01/27/20 0126  WBC 7.9  --  7.9  --   --   --  7.8  NEUTROABS 6.2  --   --   --   --   --   --   HGB 9.4*   < > 9.2* 7.8* 9.1* 9.1* 8.1*  HCT 31.3*   < > 30.6* 27.3* 28.5* 28.0* 25.4*  MCV 97.5  --  97.1  --   --   --  93.4  PLT 255  --  289  --   --   --  228   < > = values in this interval not displayed.   Basic Metabolic Panel: Recent Labs  Lab 01/25/20 2104 01/26/20 0256 01/27/20 0126  NA 141 141 142  K 4.4 3.8 4.2  CL 109 110 111  CO2 GLUCOSE 188* 139* 110*  BUN 38* 35* 37*  CREATININE 1.93* 1.75* 1.83*  CALCIUM 8.0* 7.9* 7.7*  MG  --  2.2 2.1  PHOS  --   --  3.7   Liver Function Tests: Recent Labs  Lab 01/25/20 2104  AST 23  ALT 17  ALKPHOS 156*  BILITOT 1.0  PROT 5.3*  ALBUMIN 1.9*   No results for input(s): LIPASE, AMYLASE in the last 168 hours. No results for input(s): AMMONIA in the last 168 hours. Cardiac Enzymes: No results for input(s): CKTOTAL, CKMB, CKMBINDEX, TROPONINI in the last 168 hours. BNP (last 3 results) Recent Labs    09/09/19 0320 12/07/19 0833  BNP 820.0* 2,150.1*    ProBNP (last 3 results) No results for input(s): PROBNP in the last 8760 hours.  CBG: No results for input(s): GLUCAP in the last 168 hours. No results found for this or any previous visit (from the  past 240 hour(s)).   Studies: DG Chest Port 1 View  Result Date: 01/25/2020 CLINICAL DATA:  Weakness EXAM: PORTABLE CHEST 1 VIEW COMPARISON:  December 07, 2019 FINDINGS: There is unchanged moderate cardiomegaly. Aortic knob calcifications are seen. Mildly increased interstitial markings seen throughout lungs however improved since the prior exam. A small bilateral effusions are seen, right greater than. IMPRESSION: Slight interval improvement in the  interstitial edema. Small bilateral effusions, right greater. Electronically Signed   By: Jonna Clark M.D.   On: 01/25/2020 20:14   VAS [place] LOWER EXTREMITY VENOUS (DVT)  Result Date: 01/26/2020  Lower Venous DVTStudy Indications: Swelling, and Edema.  Limitations: Bandages. Comparison Study: no prior Performing Technologist: Blanch Media RVS  Examination Guidelines: A complete evaluation includes B-mode imaging, spectral Doppler, color Doppler, and power Doppler as needed of all accessible portions of each vessel. Bilateral testing is considered an integral part of a complete examination. Limited examinations for reoccurring indications may be performed as noted. The reflux portion of the exam is performed with the patient in reverse Trendelenburg.  +---------+---------------+---------+-----------+----------+-------------------+ RIGHT    CompressibilityPhasicitySpontaneityPropertiesThrombus Aging      +---------+---------------+---------+-----------+----------+-------------------+ CFV      Full           Yes      Yes                                      +---------+---------------+---------+-----------+----------+-------------------+ SFJ      Full                                                             +---------+---------------+---------+-----------+----------+-------------------+ FV Prox  Full                                                             +---------+---------------+---------+-----------+----------+-------------------+  FV Mid   Full                                                             +---------+---------------+---------+-----------+----------+-------------------+ FV DistalFull                                                             +---------+---------------+---------+-----------+----------+-------------------+ PFV      Full                                                             +---------+---------------+---------+-----------+----------+-------------------+ POP      Full           Yes      Yes                                      +---------+---------------+---------+-----------+----------+-------------------+ PTV  not visualized due                                                        to bandaging        +---------+---------------+---------+-----------+----------+-------------------+ PERO                                                  not visualized due                                                        to bandaging        +---------+---------------+---------+-----------+----------+-------------------+   +----+---------------+---------+-----------+----------+--------------+ LEFTCompressibilityPhasicitySpontaneityPropertiesThrombus Aging +----+---------------+---------+-----------+----------+--------------+ CFV                                              Not visualized +----+---------------+---------+-----------+----------+--------------+     Summary: RIGHT: - There is no evidence of deep vein thrombosis in the lower extremity.  - No cystic structure found in the popliteal fossa.   *See table(s) above for measurements and observations. Electronically signed by Gretta Beganodd Early MD on 01/26/2020 at 5:18:46 PM.    Final    VAS US UPPER EXTREMITY VENOUS DUPLEX  Result Date: 01/26/2020 UPPER VENOUS STUDY  Indications: Edema Comparison Study: no prior Performing Technologist: Blanch MediaMegan Riddle RVS   Examination Guidelines: A complete evaluation includes B-mode imaging, spectral Doppler, color Doppler, and power Doppler as needed of all accessible portions of each vessel. Bilateral testing is considered an integral part of a complete examination. Limited examinations for reoccurring indications may be performed as noted.  Right Findings: +----------+------------+---------+-----------+----------+-------+ RIGHT     CompressiblePhasicitySpontaneousPropertiesSummary +----------+------------+---------+-----------+----------+-------+ IJV           Full       Yes       Yes                      +----------+------------+---------+-----------+----------+-------+ Subclavian    Full       Yes       Yes                      +----------+------------+---------+-----------+----------+-------+ Axillary      Full       Yes       Yes                      +----------+------------+---------+-----------+----------+-------+ Brachial      Full       Yes       Yes                      +----------+------------+---------+-----------+----------+-------+ Radial        Full                                          +----------+------------+---------+-----------+----------+-------+  Ulnar         Full                                          +----------+------------+---------+-----------+----------+-------+ Cephalic      Full                                          +----------+------------+---------+-----------+----------+-------+ Basilic       Full                                          +----------+------------+---------+-----------+----------+-------+  Summary:  Right: No evidence of deep vein thrombosis in the upper extremity. No evidence of superficial vein thrombosis in the upper extremity.  *See table(s) above for measurements and observations.  Diagnosing physician: Gretta Began MD Electronically signed by Gretta Began MD on 01/26/2020 at 5:13:58 PM.    Final    CT Angio  Abd/Pel w/ and/or w/o  Result Date: 01/26/2020 CLINICAL DATA:  79 year old with rectal bleeding. EXAM: CT ANGIOGRAPHY ABDOMEN AND PELVIS WITH CONTRAST AND WITHOUT CONTRAST TECHNIQUE: Multidetector CT imaging of the abdomen and pelvis was performed using the standard protocol during bolus administration of intravenous contrast. Multiplanar reconstructed images and MIPs were obtained and reviewed to evaluate the vascular anatomy. CONTRAST:  32mL OMNIPAQUE IOHEXOL 350 MG/ML SOLN COMPARISON:  CT abdomen 11/05/2009 FINDINGS: VASCULAR Aorta: Atherosclerotic calcifications in the abdominal aorta without aneurysm or dissection. Celiac: Celiac trunk is patent with scattered atherosclerotic calcifications. Extensive atherosclerotic calcifications involving the splenic artery. Main branches of the celiac trunk are patent. However, there is no significant contrast identified in the distal splenic artery and no significant enhancement of the spleen even on the venous phase imaging. Findings are suggestive for marked arterial compromise to the spleen and distal splenic artery region. SMA: SMA is widely patent with atherosclerotic calcifications. Renals: Atherosclerotic calcifications involving bilateral renal arteries. Cannot exclude hemodynamically significant stenosis involving the origin the right renal artery. Less than 50% stenosis in left renal artery. IMA: Narrowing at the origin.  Patent. Inflow: Diffuse atherosclerotic calcifications in iliac arteries. Common and external iliac arteries are patent without significant stenosis. There appears to be stenosis involving the left internal iliac artery. Right internal iliac artery is patent. Proximal Outflow: Proximal femoral arteries are patent bilaterally with atherosclerotic calcifications. Veins: Main portal venous system is patent. No abnormality to the IVC or iliac veins. Renal veins are patent. Review of the MIP images confirms the above findings. NON-VASCULAR Lower  chest: Bilateral pleural effusions, right side greater than left. The right pleural effusion appears to be at least moderate in size. Compressive atelectasis in both lower lobes. Hepatobiliary: Multiple gallstones without gallbladder distention or inflammatory changes. Normal appearance of the liver. No biliary dilatation. Pancreas: Unremarkable. No pancreatic ductal dilatation or surrounding inflammatory changes. Spleen: Spleen size is normal. No significant enhancement in the spleen even on the delayed images. Adrenals/Urinary Tract: Adrenal glands are unremarkable. Cortical scarring in the posterior left kidney. Negative for hydronephrosis. Small amount of fluid in the urinary bladder. Stomach/Bowel: High-density stool in the colon on the noncontrast image limits evaluation for GI bleeding. No clear evidence for active GI bleeding on this examination. No acute  inflammatory changes involving the appendix. Stomach and duodenum are unremarkable. No evidence for bowel dilatation or focal bowel inflammation. Diverticulosis involving the sigmoid colon. Lymphatic: No significant lymph node enlargement in the abdomen or pelvis. Reproductive: Prostate is unremarkable. Other: Extensive subcutaneous edema in the abdomen and pelvis. Presacral edema. No ascites. Negative for free air. Musculoskeletal: No acute bone abnormality. IMPRESSION: VASCULAR 1. No evidence for active GI bleeding. 2. Diffuse atherosclerotic disease involving the aorta, visceral arteries and iliac arteries. Aortic Atherosclerosis (ICD10-I70.0). 3. 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 Concern for a distal occlusion of the splenic artery or markedly decreased flow. 4. Right renal artery stenosis could be hemodynamically significant. NON-VASCULAR 1. Colonic diverticulosis. No evidence for acute bowel inflammation. 2. Extensive subcutaneous edema with bilateral  pleural effusions. Findings are suggestive for fluid overload state. 3. Cholelithiasis. Electronically Signed   By: Richarda Overlie M.D.   On: 01/26/2020 08:40      Joycelyn Das, MD  [facility] 01/27/2020

## 2020-01-27 NOTE — Progress Notes (Signed)
Daily Rounding Note  01/27/2020, 11:03 AM  LOS: 1 day   SUBJECTIVE:   Chief complaint:  Limited rectal bleeding, anemia.     No further BM's or bleeding.  Pt says last BM was on Tuesday, 2 d ago.  Hungry.  On clears.  No abd pain  OBJECTIVE:         Vital signs in last 24 hours:    Temp:  [97.6 F (36.4 C)-98.1 F (36.7 C)] 98.1 F (36.7 C) (07/29 0815) Pulse Rate:  [84-108] 97 (07/29 0947) Resp:  [16-18] 16 (07/29 0815) BP: (93-116)/(58-83) 108/78 (07/29 0947) SpO2:  [100 %] 100 % (07/29 0815) Weight:  [98.2 kg] 98.2 kg (07/29 0511) Last BM Date: 01/26/20 Filed Weights   01/25/20 1946 01/27/20 0511  Weight: (!) 95 kg (!) 98.2 kg   General: looks chronically ill and pale, sallow   Heart: irreg, irreg.  Rate 90s per monitor Chest: clear bil.  No labored breathing. Abdomen: soft, NT, ND.  Active BS  Extremities: LEs ace wrapped bil.   Neuro/Psych:  Pleasant, calm, fluid speech.  No confusion.  No tremor or gross deficits.    Intake/Output from previous day: 07/28 0701 - 07/29 0700 In: 990 [P.O.:360; Blood:630] Out: 400 [Urine:400]  Intake/Output this shift: No intake/output data recorded.  Lab Results: Recent Labs    01/25/20 2104 01/25/20 2104 01/26/20 0256 01/26/20 0529 01/26/20 1659 01/27/20 0126 01/27/20 0852  WBC 7.9  --  7.9  --   --  7.8  --   HGB 9.4*   < > 9.2*   < > 9.1* 8.1* 8.5*  HCT 31.3*   < > 30.6*   < > 28.0* 25.4* 26.4*  PLT 255  --  289  --   --  228  --    < > = values in this interval not displayed.   BMET Recent Labs    01/25/20 2104 01/26/20 0256 01/27/20 0126  NA 141 141 142  K 4.4 3.8 4.2  CL 109 110 111  CO2 23 23 24   GLUCOSE 188* 139* 110*  BUN 38* 35* 37*  CREATININE 1.93* 1.75* 1.83*  CALCIUM 8.0* 7.9* 7.7*   LFT Recent Labs    01/25/20 2104  PROT 5.3*  ALBUMIN 1.9*  AST 23  ALT 17  ALKPHOS 156*  BILITOT 1.0   PT/INR Recent Labs    01/25/20 2104   LABPROT 15.4*  INR 1.3*   Hepatitis Panel No results for input(s): HEPBSAG, HCVAB, HEPAIGM, HEPBIGM in the last 72 hours.  Studies/Results: DG Chest Port 1 View  Result Date: 01/25/2020 CLINICAL DATA:  Weakness EXAM: PORTABLE CHEST 1 VIEW COMPARISON:  December 07, 2019 FINDINGS: There is unchanged moderate cardiomegaly. Aortic knob calcifications are seen. Mildly increased interstitial markings seen throughout lungs however improved since the prior exam. A small bilateral effusions are seen, right greater than. IMPRESSION: Slight interval improvement in the interstitial edema. Small bilateral effusions, right greater. Electronically Signed   By: December 09, 2019 M.D.   On: 01/25/2020 20:14   VAS 01/27/2020 LOWER EXTREMITY VENOUS (DVT)  Result Date: 01/26/2020  Lower Venous DVTStudy Indications: Swelling, and Edema.  Limitations: Bandages. Comparison Study: no prior Performing Technologist: 01/28/2020 RVS  Examination Guidelines: A complete evaluation includes B-mode imaging, spectral Doppler, color Doppler, and power Doppler as needed of all accessible portions of each vessel. Bilateral testing is considered an integral part of a complete examination. Limited examinations for  reoccurring indications may be performed as noted. The reflux portion of the exam is performed with the patient in reverse Trendelenburg.  +---------+---------------+---------+-----------+----------+-------------------+ RIGHT    CompressibilityPhasicitySpontaneityPropertiesThrombus Aging      +---------+---------------+---------+-----------+----------+-------------------+ CFV      Full           Yes      Yes                                      +---------+---------------+---------+-----------+----------+-------------------+ SFJ      Full                                                             +---------+---------------+---------+-----------+----------+-------------------+ FV Prox  Full                                                              +---------+---------------+---------+-----------+----------+-------------------+ FV Mid   Full                                                             +---------+---------------+---------+-----------+----------+-------------------+ FV DistalFull                                                             +---------+---------------+---------+-----------+----------+-------------------+ PFV      Full                                                             +---------+---------------+---------+-----------+----------+-------------------+ POP      Full           Yes      Yes                                      +---------+---------------+---------+-----------+----------+-------------------+ PTV                                                   not visualized due                                                        to  bandaging        +---------+---------------+---------+-----------+----------+-------------------+ PERO                                                  not visualized due                                                        to bandaging        +---------+---------------+---------+-----------+----------+-------------------+   +----+---------------+---------+-----------+----------+--------------+ LEFTCompressibilityPhasicitySpontaneityPropertiesThrombus Aging +----+---------------+---------+-----------+----------+--------------+ CFV                                              Not visualized +----+---------------+---------+-----------+----------+--------------+     Summary: RIGHT: - There is no evidence of deep vein thrombosis in the lower extremity.  - No cystic structure found in the popliteal fossa.   *See table(s) above for measurements and observations. Electronically signed by Gretta Beganodd Early MD on 01/26/2020 at 5:18:46 PM.    Final    VAS US UPPER EXTREMITY VENOUS DUPLEX  Result Date:  01/26/2020 UPPER VENOUS STUDY  Indications: Edema Comparison Study: no prior Performing Technologist: Blanch MediaMegan Riddle RVS  Examination Guidelines: A complete evaluation includes B-mode imaging, spectral Doppler, color Doppler, and power Doppler as needed of all accessible portions of each vessel. Bilateral testing is considered an integral part of a complete examination. Limited examinations for reoccurring indications may be performed as noted.  Right Findings: +----------+------------+---------+-----------+----------+-------+ RIGHT     CompressiblePhasicitySpontaneousPropertiesSummary +----------+------------+---------+-----------+----------+-------+ IJV           Full       Yes       Yes                      +----------+------------+---------+-----------+----------+-------+ Subclavian    Full       Yes       Yes                      +----------+------------+---------+-----------+----------+-------+ Axillary      Full       Yes       Yes                      +----------+------------+---------+-----------+----------+-------+ Brachial      Full       Yes       Yes                      +----------+------------+---------+-----------+----------+-------+ Radial        Full                                          +----------+------------+---------+-----------+----------+-------+ Ulnar         Full                                          +----------+------------+---------+-----------+----------+-------+ Cephalic  Full                                          +----------+------------+---------+-----------+----------+-------+ Basilic       Full                                          +----------+------------+---------+-----------+----------+-------+  Summary:  Right: No evidence of deep vein thrombosis in the upper extremity. No evidence of superficial vein thrombosis in the upper extremity.  *See table(s) above for measurements and observations.  Diagnosing  physician: Gretta Began MD Electronically signed by Gretta Began MD on 01/26/2020 at 5:13:58 PM.    Final    CT Angio Abd/Pel w/ and/or w/o  Result Date: 01/26/2020 CLINICAL DATA:  79 year old with rectal bleeding. EXAM: CT ANGIOGRAPHY ABDOMEN AND PELVIS WITH CONTRAST AND WITHOUT CONTRAST TECHNIQUE: Multidetector CT imaging of the abdomen and pelvis was performed using the standard protocol during bolus administration of intravenous contrast. Multiplanar reconstructed images and MIPs were obtained and reviewed to evaluate the vascular anatomy. CONTRAST:  80mL OMNIPAQUE IOHEXOL 350 MG/ML SOLN COMPARISON:  CT abdomen 11/05/2009 FINDINGS: VASCULAR Aorta: Atherosclerotic calcifications in the abdominal aorta without aneurysm or dissection. Celiac: Celiac trunk is patent with scattered atherosclerotic calcifications. Extensive atherosclerotic calcifications involving the splenic artery. Main branches of the celiac trunk are patent. However, there is no significant contrast identified in the distal splenic artery and no significant enhancement of the spleen even on the venous phase imaging. Findings are suggestive for marked arterial compromise to the spleen and distal splenic artery region. SMA: SMA is widely patent with atherosclerotic calcifications. Renals: Atherosclerotic calcifications involving bilateral renal arteries. Cannot exclude hemodynamically significant stenosis involving the origin the right renal artery. Less than 50% stenosis in left renal artery. IMA: Narrowing at the origin.  Patent. Inflow: Diffuse atherosclerotic calcifications in iliac arteries. Common and external iliac arteries are patent without significant stenosis. There appears to be stenosis involving the left internal iliac artery. Right internal iliac artery is patent. Proximal Outflow: Proximal femoral arteries are patent bilaterally with atherosclerotic calcifications. Veins: Main portal venous system is patent. No abnormality to the IVC  or iliac veins. Renal veins are patent. Review of the MIP images confirms the above findings. NON-VASCULAR Lower chest: Bilateral pleural effusions, right side greater than left. The right pleural effusion appears to be at least moderate in size. Compressive atelectasis in both lower lobes. Hepatobiliary: Multiple gallstones without gallbladder distention or inflammatory changes. Normal appearance of the liver. No biliary dilatation. Pancreas: Unremarkable. No pancreatic ductal dilatation or surrounding inflammatory changes. Spleen: Spleen size is normal. No significant enhancement in the spleen even on the delayed images. Adrenals/Urinary Tract: Adrenal glands are unremarkable. Cortical scarring in the posterior left kidney. Negative for hydronephrosis. Small amount of fluid in the urinary bladder. Stomach/Bowel: High-density stool in the colon on the noncontrast image limits evaluation for GI bleeding. No clear evidence for active GI bleeding on this examination. No acute inflammatory changes involving the appendix. Stomach and duodenum are unremarkable. No evidence for bowel dilatation or focal bowel inflammation. Diverticulosis involving the sigmoid colon. Lymphatic: No significant lymph node enlargement in the abdomen or pelvis. Reproductive: Prostate is unremarkable. Other: Extensive subcutaneous edema in the abdomen and pelvis. Presacral edema. No ascites. Negative for free air. Musculoskeletal: No  acute bone abnormality. IMPRESSION: VASCULAR 1. No evidence for active GI bleeding. 2. Diffuse atherosclerotic disease involving the aorta, visceral arteries and iliac arteries. Aortic Atherosclerosis (ICD10-I70.0). 3. 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 Concern for a distal occlusion of the splenic artery or markedly decreased flow. 4. Right renal artery stenosis could be hemodynamically significant. NON-VASCULAR 1.  Colonic diverticulosis. No evidence for acute bowel inflammation. 2. Extensive subcutaneous edema with bilateral pleural effusions. Findings are suggestive for fluid overload state. 3. Cholelithiasis. Electronically Signed   By: Richarda Overlie M.D.   On: 01/26/2020 08:40   Scheduled Meds: . sodium chloride   Intravenous Once  . acetaminophen  650 mg Oral Once  . allopurinol  300 mg Oral Daily  . amiodarone  200 mg Oral Daily  . carvedilol  25 mg Oral BID  . hydrALAZINE  25 mg Oral BID  . isosorbide mononitrate  30 mg Oral Daily  . montelukast  10 mg Oral QHS  . pantoprazole (PROTONIX) IV  40 mg Intravenous Q12H   Continuous Infusions: PRN Meds:.albuterol   ASSESMENT:   *  Limited bleeding PR.  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  renal artery stenosis.  Sigmoid tics.   Suspect diverticular bleed.    *   Blood loss anemia Hgb 7.8 >> 1 PRBC >> 8.5.  Baseline is ~ 11.5 1 month ago.     *   periph vasc disease.  CAD, cardiac stents.  CHF.  Previous Plavix and Coumadin per note from 2016. 81 ASA now, no platelet agents or thinners PTA.     PLAN   *  Advance diet.  Further plans for colonoscopy, EGD (outpt) or GI office fup per Dr Barron Alvine  *   Stopped the IV Protonix.  No PPI required PTA.      Jennye Moccasin  01/27/2020, 11:03 AM Phone [phone]

## 2020-01-28 LAB — CBC
HCT: 23.7 % — ABNORMAL LOW (ref 39.0–52.0)
Hemoglobin: 7.5 g/dL — ABNORMAL LOW (ref 13.0–17.0)
MCH: 30.7 pg (ref 26.0–34.0)
MCHC: 31.6 g/dL (ref 30.0–36.0)
MCV: 97.1 fL (ref 80.0–100.0)
Platelets: 232 10*3/uL (ref 150–400)
RBC: 2.44 MIL/uL — ABNORMAL LOW (ref 4.22–5.81)
RDW: 17 % — ABNORMAL HIGH (ref 11.5–15.5)
WBC: 7 10*3/uL (ref 4.0–10.5)
nRBC: 0.3 % — ABNORMAL HIGH (ref 0.0–0.2)

## 2020-01-28 LAB — BASIC METABOLIC PANEL
Anion gap: 8 (ref 5–15)
BUN: 36 mg/dL — ABNORMAL HIGH (ref 8–23)
CO2: 22 mmol/L (ref 22–32)
Calcium: 7.6 mg/dL — ABNORMAL LOW (ref 8.9–10.3)
Chloride: 110 mmol/L (ref 98–111)
Creatinine, Ser: 1.85 mg/dL — ABNORMAL HIGH (ref 0.61–1.24)
GFR calc Af Amer: 40 mL/min — ABNORMAL LOW (ref >60)
GFR calc non Af Amer: 34 mL/min — ABNORMAL LOW (ref >60)
Glucose, Bld: 117 mg/dL — ABNORMAL HIGH (ref 70–99)
Potassium: 3.6 mmol/L (ref 3.5–5.1)
Sodium: 140 mmol/L (ref 135–145)

## 2020-01-28 LAB — SARS CORONAVIRUS 2 (TAT 6-24 HRS): SARS Coronavirus 2: NEGATIVE

## 2020-01-28 NOTE — TOC Progression Note (Addendum)
Transition of Care [facility]) - Progression Note    Patient Details  Name: EZEKIAH MASSIE MRN: [id number] Date of Birth: 04/23/1941  [facility]) CM/SW Contact  Terrial Rhodes, LCSWA Phone Number: 01/28/2020, 2:11 PM  Clinical Narrative:     CSW started insurance authorization for patient. Reference number is #[id number].Clinicals faxed over to [facility] for review.  Plan is for patient to return to [facility]. Insurance authorization pending.  CSW will continue to follow.  Expected Discharge Plan: Skilled Nursing Facility Barriers to Discharge: Continued Medical Work up  Expected Discharge Plan and Services Expected Discharge Plan: Skilled Nursing Facility       Living arrangements for the past 2 months: Skilled Nursing Facility                                       Social Determinants of Health (SDOH) Interventions    Readmission Risk Interventions No flowsheet data found.

## 2020-01-28 NOTE — TOC Initial Note (Addendum)
Transition of Care [facility]) - Initial/Assessment Note    Patient Details  Name: Larry Lawrence MRN: [id number] Date of Birth: 1940/11/25  [facility] CM/SW Contact:    Terrial Rhodes, LCSWA Phone Number: 01/28/2020, 2:03 PM  Clinical Narrative:                  CSW spoke with patient at bedside. Patient is agreeable to SNF placement. Patient is long term at [facility].CSW called [facility] and they confirmed that patient is long term resident there. Plan is for him to return to [facility]. Patient gave CSW permission to discuss his care with his Roanna Banning and his Daughter [name].  Plan is for patient to return to [facility]. CSW will start insurance authorization.  CSW will continue to follow. Expected Discharge Plan: Skilled Nursing Facility Barriers to Discharge: Continued Medical Work up   Patient Goals and CMS Choice Patient states their goals for this hospitalization and ongoing recovery are:: to go to SNF CMS Medicare.gov Compare Post Acute Care list provided to:: Patient Choice offered to / list presented to : Patient  Expected Discharge Plan and Services Expected Discharge Plan: Skilled Nursing Facility       Living arrangements for the past 2 months: Skilled Nursing Facility                                      Prior Living Arrangements/Services Living arrangements for the past 2 months: Skilled Nursing Facility Lives with:: Self, Facility Resident Patient language and need for interpreter reviewed:: Yes Do you feel safe going back to the place where you live?: Yes      Need for Family Participation in Patient Care: Yes (Comment) Care giver support system in place?: Yes (comment)   Criminal Activity/Legal Involvement Pertinent to Current Situation/Hospitalization: No - Comment as needed  Activities of Daily Living Home Assistive Devices/Equipment: Walker (specify type) ADL Screening (condition at time of admission) Patient's  cognitive ability adequate to safely complete daily activities?: Yes Is the patient deaf or have difficulty hearing?: No Does the patient have difficulty seeing, even when wearing glasses/contacts?: Yes Does the patient have difficulty concentrating, remembering, or making decisions?: No Patient able to express need for assistance with ADLs?: Yes Does the patient have difficulty dressing or bathing?: No Independently performs ADLs?: Yes (appropriate for developmental age) Does the patient have difficulty walking or climbing stairs?: Yes Weakness of Legs: Both Weakness of Arms/Hands: None  Permission Sought/Granted Permission sought to share information with : Case Manager, Family Supports, Oceanographer granted to share information with : Yes, Verbal Permission Granted  Share Information with NAME: Josh and Tammy  Permission granted to share info w AGENCY: SNFs  Permission granted to share info w Relationship: Grandson and Daughter  Permission granted to share info w Contact Information: Sharia Reeve [phone] and [name] [phone]  Emotional Assessment Appearance:: Appears stated age Attitude/Demeanor/Rapport: Gracious Affect (typically observed): Calm Orientation: :  (WDL) Alcohol / Substance Use: Not Applicable Psych Involvement: No (comment)  Admission diagnosis:  Edema [R60.9] GI bleed [K92.2] GIB (gastrointestinal bleeding) [K92.2] Lower GI bleed [K92.2] Patient Active Problem List   Diagnosis Date Noted  . GIB (gastrointestinal bleeding) 01/26/2020  . GI bleed 01/25/2020  . Acute blood loss anemia 01/25/2020  . Goals of care, counseling/discussion   . Palliative care by specialist   . Acute on chronic systolic  CHF (congestive heart failure) (HCC) 12/07/2019  . CKD (chronic kidney disease), stage III 12/07/2019  . COVID-19 virus infection 12/07/2019  . Acute on chronic systolic (congestive) heart failure (HCC) 12/07/2019  . Pressure injury of  skin 09/13/2019  . Ambulatory dysfunction   . Atrial fibrillation (HCC)   . Fall 09/09/2019  . Leukocytosis 09/09/2019  . TIA (transient ischemic attack)   . CAD (coronary artery disease)   . HTN (hypertension)   . HLD (hyperlipidemia)   . V tach (HCC)   . Right knee pain   . Atrial fibrillation, new onset (HCC)   . Generalized weakness   . Lymphedema 08/24/2018  . Acute CHF (congestive heart failure) (HCC) 06/30/2018  . Near syncope 04/24/2017  . Bradycardia 04/24/2017  . Gout 02/01/2016  . Syncope 08/09/2015  . Hypokalemia 08/09/2015  . Elevated troponin 02/20/2015  . Acute renal failure superimposed on stage 3a chronic kidney disease (HCC) 02/20/2015  . Chronic systolic CHF (congestive heart failure) (HCC) 02/20/2015  . CKD (chronic kidney disease) 02/20/2015   PCP:  [name], MD Pharmacy:  No Pharmacies Listed    Social Determinants of Health (SDOH) Interventions    Readmission Risk Interventions No flowsheet data found.

## 2020-01-28 NOTE — Progress Notes (Addendum)
PROGRESS NOTE  Larry Lawrence CWC:[id number] DOB: [birth year] DOA: 01/25/2020 PCP: [name], MD   LOS: 2 days   Brief narrative: As per HPI,  79 year old male with past medical history of congestive heart failure, coronary artery disease, gout, TIA presented to the hospital from skilled nursing facility with complaints of rectal bleeding.  No prior history of rectal bleed or colonoscopy.  He did have some constipation prior to coming in the hospital.  In the ED, his hemoglobin was 9.4 with a two-point drop from his previous hemoglobin levels.   Guaiac done by the ER physician which showed dark red blood.  He has occult positive stool.  Patient was then admitted to hospital for evaluation of GI bleed.  Assessment/Plan:  Principal Problem:   GI bleed Active Problems:   Chronic systolic CHF (congestive heart failure) (HCC)   CAD (coronary artery disease)   HTN (hypertension)   HLD (hyperlipidemia)   Acute blood loss anemia   GIB (gastrointestinal bleeding)   GI bleed with acute blood loss anemia. Likely diverticular bleed.  Weekly has ceased. Continue IV Protonix.  GI on board and is Status post 2 units of packed RBC . CT angiogram of the abdomen did not show any evidence of acute GI bleed but diverticulosis noted.  Lactate of 2.4.  Latest hemoglobin of 7.5 from 8.1 after transfusion 2 units of RBC.  Patient has not had a bowel movement.  Continue H&H.  Continue to monitor.  GI has signed off at this time.  Will need outpatient colonoscopy    Chronic systolic CHF  Last known last 2D echo 12/08/2019, EF 20 to 25%.  Status post 2 units of packed RBC.  Will closely monitor.  Watch for volume overload.  Currently compensated  Right upper extremity swelling Venous duplex negative for DVT.  Essential HTN  Blood pressure on the hypotensive side, continue to hold antihypertensive medications for parameters  hyperlipidemia   Currently on clears  Mild AKI on Chronic kidney  disease IIa Creatinine of 1.8 today from 1.8.  We will closelyy monitor.  Avoid diuretics for now.  Debility deconditioning.  Patient is from [facility] assisted living facility.  Physical therapy evaluation appreciated.  Recommended skilled nursing facility placement.Marland Kitchen  FL2 Form signed.  Transition of care on board.  DVT prophylaxis: Place and maintain sequential compression device Start: 01/27/20 1426 Place and maintain sequential compression device Start: 01/27/20 1426 SCDs Start: 01/26/20 0123   Code Status: Full code  Family Communication: None today.  I spoke with the patient's daughter yesterday.  Status is: inpatient  The patient is inpatient because: Unsafe d/c plan, Inpatient level of care appropriate due to severity of illness, closer monitoring, plan for skilled nursing facility placement.  Dispo: The patient is from: [facility]              Anticipated d/c is to: Skilled nursing facility              Anticipated d/c date is: 1 to 2 days              Patient currently is  medically stable to d/c.  Consultants:  GI   Procedures:  PRBC transfusion II units  Antibiotics:  . None  Anti-infectives (From admission, onward)   None      Subjective: Today, patient was seen and examined at bedside.  Patient denies any bowel movements.  Denies any shortness of breath, cough, fever but complains of generalized weakness.  Objective:  Vitals:   01/28/20 0730 01/28/20 1055  BP: 105/68 (!) 106/64  Pulse: 91 100  Resp: 16 18  Temp: 97.9 F (36.6 C) 97.6 F (36.4 C)  SpO2: 99% 99%    Intake/Output Summary (Last 24 hours) at 01/28/2020 1300 Last data filed at 01/27/2020 2046 Gross per 24 hour  Intake 360 ml  Output 600 ml  Net -240 ml   Filed Weights   01/25/20 1946 01/27/20 0511 01/28/20 0434  Weight: (!) 95 kg (!) 98.2 kg (!) 104 kg   Body mass index is 31.98 kg/m.   Physical Exam GENERAL: Patient is alert awake and oriented. Not in obvious distress.   Obese HENT: Mild pallor noted. Pupils equally reactive to light. Oral mucosa is moist NECK: is supple, no gross swelling noted. CHEST: Clear to auscultation. No crackles or wheezes.   CVS: S1 and S2 heard, no murmur. Regular rate and rhythm.  ABDOMEN: Soft, non-tender, bowel sounds are present.  External urinary catheter in place. EXTREMITIES: Right upper extremity edema noted.  Nontender on palpation.  Bilateral lower extremities with Unna boot. CNS: Cranial nerves are intact. No focal motor deficits. SKIN: warm and dry without rashes.  Data Review: I have personally reviewed the following laboratory data and studies,  CBC: Recent Labs  Lab 01/25/20 2104 01/25/20 2104 01/26/20 0256 01/26/20 0529 01/26/20 1421 01/26/20 1659 01/27/20 0126 01/27/20 0852 01/28/20 0704  WBC 7.9  --  7.9  --   --   --  7.8  --  7.0  NEUTROABS 6.2  --   --   --   --   --   --   --   --   HGB 9.4*   < > 9.2*   < > 9.1* 9.1* 8.1* 8.5* 7.5*  HCT 31.3*   < > 30.6*   < > 28.5* 28.0* 25.4* 26.4* 23.7*  MCV 97.5  --  97.1  --   --   --  93.4  --  97.1  PLT 255  --  289  --   --   --  228  --  232   < > = values in this interval not displayed.   Basic Metabolic Panel: Recent Labs  Lab 01/25/20 2104 01/26/20 0256 01/27/20 0126 01/28/20 0704  NA 141 141 142 140  K 4.4 3.8 4.2 3.6  CL 109 110 111 110  CO2 23 23 24 22   GLUCOSE 188* 139* 110* 117*  BUN 38* 35* 37* 36*  CREATININE 1.93* 1.75* 1.83* 1.85*  CALCIUM 8.0* 7.9* 7.7* 7.6*  MG  --  2.2 2.1  --   PHOS  --   --  3.7  --    Liver Function Tests: Recent Labs  Lab 01/25/20 2104  AST 23  ALT 17  ALKPHOS 156*  BILITOT 1.0  PROT 5.3*  ALBUMIN 1.9*   No results for input(s): LIPASE, AMYLASE in the last 168 hours. No results for input(s): AMMONIA in the last 168 hours. Cardiac Enzymes: No results for input(s): CKTOTAL, CKMB, CKMBINDEX, TROPONINI in the last 168 hours. BNP (last 3 results) Recent Labs    09/09/19 0320 12/07/19 0833   BNP 820.0* 2,150.1*    ProBNP (last 3 results) No results for input(s): PROBNP in the last 8760 hours.  CBG: No results for input(s): GLUCAP in the last 168 hours. No results found for this or any previous visit (from the past 240 hour(s)).   Studies: VAS 02/06/20 UPPER EXTREMITY VENOUS DUPLEX  Result Date: 01/26/2020 UPPER VENOUS STUDY  Indications: Edema Comparison Study: no prior Performing Technologist: Blanch Media RVS  Examination Guidelines: A complete evaluation includes B-mode imaging, spectral Doppler, color Doppler, and power Doppler as needed of all accessible portions of each vessel. Bilateral testing is considered an integral part of a complete examination. Limited examinations for reoccurring indications may be performed as noted.  Right Findings: +----------+------------+---------+-----------+----------+-------+ RIGHT     CompressiblePhasicitySpontaneousPropertiesSummary +----------+------------+---------+-----------+----------+-------+ IJV           Full       Yes       Yes                      +----------+------------+---------+-----------+----------+-------+ Subclavian    Full       Yes       Yes                      +----------+------------+---------+-----------+----------+-------+ Axillary      Full       Yes       Yes                      +----------+------------+---------+-----------+----------+-------+ Brachial      Full       Yes       Yes                      +----------+------------+---------+-----------+----------+-------+ Radial        Full                                          +----------+------------+---------+-----------+----------+-------+ Ulnar         Full                                          +----------+------------+---------+-----------+----------+-------+ Cephalic      Full                                          +----------+------------+---------+-----------+----------+-------+ Basilic       Full                                           +----------+------------+---------+-----------+----------+-------+  Summary:  Right: No evidence of deep vein thrombosis in the upper extremity. No evidence of superficial vein thrombosis in the upper extremity.  *See table(s) above for measurements and observations.  Diagnosing physician: Gretta Began MD Electronically signed by Gretta Began MD on 01/26/2020 at 5:13:58 PM.    Final       Joycelyn Das, MD  [facility] 01/28/2020

## 2020-01-28 NOTE — NC FL2 (Signed)
[place] MEDICAID FL2 LEVEL OF CARE SCREENING TOOL     IDENTIFICATION  Patient Name: Larry Lawrence Birthdate: March 14, 1941 Sex: male Admission Date (Current Location): 01/25/2020  [facility] and [place] Number:  Producer, television/film/video and Address:  [facility], [address] [place], [place] [address]      Provider Number: [id number]  Attending Physician Name and Address:  Joycelyn Das, MD  Relative Name and Phone Number:  [name] [phone]    Current Level of Care: Hospital Recommended Level of Care: Skilled Nursing Facility Prior Approval Number:    Date Approved/Denied:   PASRR Number: [id number]  Discharge Plan: SNF    Current Diagnoses: Patient Active Problem List   Diagnosis Date Noted  . GIB (gastrointestinal bleeding) 01/26/2020  . GI bleed 01/25/2020  . Acute blood loss anemia 01/25/2020  . Goals of care, counseling/discussion   . Palliative care by specialist   . Acute on chronic systolic CHF (congestive heart failure) (HCC) 12/07/2019  . CKD (chronic kidney disease), stage III 12/07/2019  . COVID-19 virus infection 12/07/2019  . Acute on chronic systolic (congestive) heart failure (HCC) 12/07/2019  . Pressure injury of skin 09/13/2019  . Ambulatory dysfunction   . Atrial fibrillation (HCC)   . Fall 09/09/2019  . Leukocytosis 09/09/2019  . TIA (transient ischemic attack)   . CAD (coronary artery disease)   . HTN (hypertension)   . HLD (hyperlipidemia)   . V tach (HCC)   . Right knee pain   . Atrial fibrillation, new onset (HCC)   . Generalized weakness   . Lymphedema 08/24/2018  . Acute CHF (congestive heart failure) (HCC) 06/30/2018  . Near syncope 04/24/2017  . Bradycardia 04/24/2017  . Gout 02/01/2016  . Syncope 08/09/2015  . Hypokalemia 08/09/2015  . Elevated troponin 02/20/2015  . Acute renal failure superimposed on stage 3a chronic kidney disease (HCC) 02/20/2015  . Chronic systolic CHF (congestive heart  failure) (HCC) 02/20/2015  . CKD (chronic kidney disease) 02/20/2015    Orientation RESPIRATION BLADDER Height & Weight      (WDL)  Normal Incontinent, External catheter (External Urinary Catheter) Weight: (!) 229 lb 4.5 oz (104 kg) Height:  5\' 11"  (180.3 cm)  BEHAVIORAL SYMPTOMS/MOOD NEUROLOGICAL BOWEL NUTRITION STATUS      Continent Diet (See Discharge Summary)  AMBULATORY STATUS COMMUNICATION OF NEEDS Skin   Extensive Assist Verbally Other (Comment) (Approp. for ethnicity,Ecchymosis Arm Right;Left, Excoriated loc. Arm Right;left, non-tenting)                       Personal Care Assistance Level of Assistance  Bathing, Feeding, Dressing Bathing Assistance: Maximum assistance Feeding assistance: Independent (able to feed self) Dressing Assistance: Maximum assistance     Functional Limitations Info  Sight, Hearing, Speech Sight Info: Impaired Hearing Info: Adequate Speech Info: Adequate    SPECIAL CARE FACTORS FREQUENCY  PT (By licensed PT), OT (By licensed OT)     PT Frequency: 5x min weekly OT Frequency: 5x min weekly            Contractures Contractures Info: Not present    Additional Factors Info  Code Status, Allergies Code Status Info: FULL Allergies Info: Niaspan           Current Medications (01/28/2020):  This is the current hospital active medication list Current Facility-Administered Medications  Medication Dose Route Frequency Provider Last Rate Last Admin  . 0.9 %  sodium chloride infusion (Manually program via Guardrails IV  Fluids)   Intravenous Once Chotiner, Claudean Severance, MD      . acetaminophen (TYLENOL) tablet 650 mg  650 mg Oral Once Chotiner, Claudean Severance, MD      . albuterol (VENTOLIN HFA) 108 (90 Base) MCG/ACT inhaler 2 puff  2 puff Inhalation Q4H PRN Crosley, Debby, MD      . allopurinol (ZYLOPRIM) tablet 300 mg  300 mg Oral Daily Crosley, Debby, MD   300 mg at 01/28/20 1001  . amiodarone (PACERONE) tablet 200 mg  200 mg Oral Daily  Crosley, Debby, MD   200 mg at 01/28/20 1001  . carvedilol (COREG) tablet 25 mg  25 mg Oral BID Pokhrel, Laxman, MD   25 mg at 01/28/20 1000  . hydrALAZINE (APRESOLINE) tablet 25 mg  25 mg Oral BID Pokhrel, Laxman, MD   25 mg at 01/28/20 1001  . isosorbide mononitrate (IMDUR) 24 hr tablet 30 mg  30 mg Oral Daily Crosley, Debby, MD   30 mg at 01/28/20 1000  . montelukast (SINGULAIR) tablet 10 mg  10 mg Oral QHS Gery Pray, MD   10 mg at 01/27/20 2136     Discharge Medications: Please see discharge summary for a list of discharge medications.  Relevant Imaging Results:  Relevant Lab Results:   Additional Information [id number]  Terrial Rhodes, LCSWA

## 2020-01-28 NOTE — Evaluation (Signed)
Physical Therapy Evaluation Patient Details Name: Larry Lawrence MRN: [id number] DOB: December 07, 1940 Today's Date: 01/28/2020   History of Present Illness  Pt is a 79 y.o. M with significant PMH of CHF, CAD, TIA, who presents from SNF with GIB with acute blood loss anemia; likely diverticular bleed and monitoring conservatively.  Clinical Impression  Pt presents with decreased functional mobility secondary to debility/deconditioning, balance deficits, BLE edema, and decreased endurance. Requiring maximal assist for bed mobility, unable to stand with +1 assist to walker. BP 121/82 sitting EOB, HR 92-101 bpm. Recommend Stedy for future attempts at transfers. Also recommend return to SNF upon discharge to maximize functional mobility and decrease caregiver burden.     Follow Up Recommendations SNF    Equipment Recommendations  None recommended by PT    Recommendations for Other Services       Precautions / Restrictions Precautions Precautions: Fall Restrictions Weight Bearing Restrictions: No      Mobility  Bed Mobility Overal bed mobility: Needs Assistance Bed Mobility: Sit to Supine;Supine to Sit;Rolling Rolling: Mod assist   Supine to sit: Max assist Sit to supine: Max assist   General bed mobility comments: MaxA for supine <> sit with assist at trunk and BLE's, cues for use of bed rail. Rolling to left and right with modA for removal of soiled chuck  Transfers Overall transfer level: Needs assistance Equipment used: Rolling walker (2 wheeled) Transfers: Sit to/from Stand Sit to Stand: Max assist         General transfer comment: Pt unable to achieve upright stand from elevated bed height with maxA, cues for rocking to gain momentum, "nose over toes."  Ambulation/Gait             General Gait Details: unable  Stairs            Wheelchair Mobility    Modified Rankin (Stroke Patients Only)       Balance Overall balance assessment: Needs  assistance Sitting-balance support: Feet supported Sitting balance-Leahy Scale: Fair                                       Pertinent Vitals/Pain Pain Assessment: Faces Faces Pain Scale: Hurts a little bit Pain Location: R knee Pain Descriptors / Indicators: Grimacing;Discomfort Pain Intervention(s): Monitored during session    Home Living Family/patient expects to be discharged to:: Skilled nursing facility                      Prior Function Level of Independence: Needs assistance   Gait / Transfers Assistance Needed: Using Stedy for out of bed transfers, prior to SNF was using a Rollator   ADL's / Homemaking Assistance Needed: Able to assist with UB bathing        Hand Dominance   Dominant Hand: Right    Extremity/Trunk Assessment   Upper Extremity Assessment Upper Extremity Assessment: Generalized weakness    Lower Extremity Assessment Lower Extremity Assessment: Generalized weakness;RLE deficits/detail;LLE deficits/detail RLE Deficits / Details: increased edema distally LLE Deficits / Details: Increased edema distally       Communication   Communication: HOH  Cognition Arousal/Alertness: Awake/alert Behavior During Therapy: WFL for tasks assessed/performed Overall Cognitive Status: Impaired/Different from baseline Area of Impairment: Memory                     Memory: Decreased short-term memory  General Comments      Exercises General Exercises - Lower Extremity Ankle Circles/Pumps: Both;10 reps;Supine   Assessment/Plan    PT Assessment Patient needs continued PT services  PT Problem List Decreased strength;Decreased activity tolerance;Decreased balance;Decreased mobility;Obesity;Pain       PT Treatment Interventions DME instruction;Gait training;Functional mobility training;Therapeutic activities;Therapeutic exercise;Balance training;Patient/family education    PT Goals (Current goals can be  found in the Care Plan section)  Acute Rehab PT Goals Patient Stated Goal: return to SNF PT Goal Formulation: With patient Time For Goal Achievement: 02/11/20 Potential to Achieve Goals: Fair    Frequency Min 2X/week   Barriers to discharge        Co-evaluation               AM-PAC PT "6 Clicks" Mobility  Outcome Measure Help needed turning from your back to your side while in a flat bed without using bedrails?: A Lot Help needed moving from lying on your back to sitting on the side of a flat bed without using bedrails?: Total Help needed moving to and from a bed to a chair (including a wheelchair)?: Total Help needed standing up from a chair using your arms (e.g., wheelchair or bedside chair)?: Total Help needed to walk in hospital room?: Total Help needed climbing 3-5 steps with a railing? : Total 6 Click Score: 7    End of Session Equipment Utilized During Treatment: Gait belt Activity Tolerance: Patient limited by fatigue Patient left: in bed;with call bell/phone within reach;with bed alarm set Nurse Communication: Mobility status PT Visit Diagnosis: Pain;Other abnormalities of gait and mobility (R26.89);Muscle weakness (generalized) (M62.81) Pain - Right/Left: Right Pain - part of body: Knee    Time: 6270-3500 PT Time Calculation (min) (ACUTE ONLY): 25 min   Charges:   PT Evaluation $PT Eval Moderate Complexity: 1 Mod PT Treatments $Therapeutic Activity: 8-22 mins          Lillia Pauls, PT, DPT Acute Rehabilitation Services Pager [phone] Office [phone]   Larry Lawrence 01/28/2020, 9:57 AM

## 2020-01-29 LAB — CBC
HCT: 22.5 % — ABNORMAL LOW (ref 39.0–52.0)
Hemoglobin: 7 g/dL — ABNORMAL LOW (ref 13.0–17.0)
MCH: 30.2 pg (ref 26.0–34.0)
MCHC: 31.1 g/dL (ref 30.0–36.0)
MCV: 97 fL (ref 80.0–100.0)
Platelets: 271 10*3/uL (ref 150–400)
RBC: 2.32 MIL/uL — ABNORMAL LOW (ref 4.22–5.81)
RDW: 17.2 % — ABNORMAL HIGH (ref 11.5–15.5)
WBC: 7.7 10*3/uL (ref 4.0–10.5)
nRBC: 0.3 % — ABNORMAL HIGH (ref 0.0–0.2)

## 2020-01-29 LAB — BASIC METABOLIC PANEL
Anion gap: 7 (ref 5–15)
BUN: 34 mg/dL — ABNORMAL HIGH (ref 8–23)
CO2: 23 mmol/L (ref 22–32)
Calcium: 7.6 mg/dL — ABNORMAL LOW (ref 8.9–10.3)
Chloride: 107 mmol/L (ref 98–111)
Creatinine, Ser: 2.16 mg/dL — ABNORMAL HIGH (ref 0.61–1.24)
GFR calc Af Amer: 33 mL/min — ABNORMAL LOW (ref >60)
GFR calc non Af Amer: 28 mL/min — ABNORMAL LOW (ref >60)
Glucose, Bld: 153 mg/dL — ABNORMAL HIGH (ref 70–99)
Potassium: 3.6 mmol/L (ref 3.5–5.1)
Sodium: 137 mmol/L (ref 135–145)

## 2020-01-29 LAB — PREPARE RBC (CROSSMATCH)

## 2020-01-29 MED ORDER — SODIUM CHLORIDE 0.9% IV SOLUTION: Freq: Once | INTRAVENOUS | Status: DC

## 2020-01-29 NOTE — TOC Progression Note (Signed)
Transition of Care [facility]) - Progression Note    Patient Details  Name: Larry Lawrence MRN: [id number] Date of Birth: February 19, 1941  [facility]) CM/SW Contact  Patrice [place], [place] Phone Number: [phone] 01/29/2020, 10:10 AM  Clinical Narrative:     CSW checked patient's authorization and it is still pending.  TOC team will continue to assist with discharge planning needs.  Expected Discharge Plan: Skilled Nursing Facility Barriers to Discharge: Continued Medical Work up  Expected Discharge Plan and Services Expected Discharge Plan: Skilled Nursing Facility       Living arrangements for the past 2 months: Skilled Nursing Facility                                       Social Determinants of Health (SDOH) Interventions    Readmission Risk Interventions Readmission Risk Prevention Plan 01/28/2020  Social Work Consult for Recovery Care Planning/Counseling Complete  Some recent data might be hidden

## 2020-01-29 NOTE — Progress Notes (Signed)
PROGRESS NOTE  FRANKO HILLIKER CWU:[id number] DOB: 04/19/1941 DOA: 01/25/2020 PCP: [name], MD   LOS: 3 days   Brief narrative: As per HPI,  79 year old male with past medical history of congestive heart failure, coronary artery disease, gout, TIA presented to the hospital from skilled nursing facility with complaints of rectal bleeding.  No prior history of rectal bleed or colonoscopy.  He did have some constipation prior to coming in the hospital.  In the ED, his hemoglobin was 9.4 with a 2 point drop from his previous hemoglobin levels.   Guaiac done by the ER physician which showed dark red blood.  He has occult positive stool.  Patient was then admitted to hospital for evaluation of GI bleed.  Assessment/Plan:  Principal Problem:   GI bleed Active Problems:   Chronic systolic CHF (congestive heart failure) (HCC)   CAD (coronary artery disease)   HTN (hypertension)   HLD (hyperlipidemia)   Acute blood loss anemia   GIB (gastrointestinal bleeding)   GI bleed with acute blood loss anemia. Likely diverticular bleed.  Has ceased.  Patient has not had a bloody bowel movement. Patient but had a bowel movement yesterday GI followed the patient during hospitalization and recommended conservative treatment. Status post 2 units of packed RBC . CT angiogram of the abdomen did not show any evidence of acute GI bleed but diverticulosis noted. 8.1>7.5>7.0 after transfusion 2 units of RBC.   GI has signed off at this time.  Will need outpatient colonoscopy. Will transfuse I additional unit of PRBC today.   Chronic systolic CHF  Last known last 2D echo 12/08/2019, EF 20 to 25%.  Status post 2 units of packed RBC.  Will closely monitor.  Watch for volume overload.  Currently compensated.  Will transfuse 1 additional unit slowly.  Right upper extremity swelling Venous duplex negative for DVT.  Essential HTN  Blood pressure better.  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  We will closely monitor.  Avoid diuretics for now.  Will receive 1 unit of packed RBC today  Debility deconditioning.  Patient is from [facility].  Physical therapy recommend skilled nursing facility placement.Marland Kitchen  FL2 Form signed.  Transition of care on board.  DVT prophylaxis: Place and maintain sequential compression device Start: 01/27/20 1426 Place and maintain sequential compression device Start: 01/27/20 1426 SCDs Start: 01/26/20 0123   Code Status: Full code  Family Communication:  None today.   Status is: inpatient  The patient is inpatient because: Unsafe d/c plan, Inpatient level of care appropriate due to severity of illness, closer monitoring, plan for skilled nursing facility placement.  Dispo: The patient is from: [facility]              Anticipated d/c is to: Skilled nursing facility              Anticipated d/c date is: 1 to 2 days              Patient currently is  medically stable to d/c.  Consultants:  GI   Procedures:  PRBC transfusion II units  Antibiotics:  . None  Anti-infectives (From admission, onward)   None      Subjective: Today, patient was seen and examined at bedside.  Denies any bleeding per rectum but had one bowel movement yesterday which was small.  Denies abdominal pain, cough, fever, chills.  Overall feels better.  Objective: Vitals:   01/29/20 0502 01/29/20 0906  BP: 121/78 112/75  Pulse: 84 88  Resp: 16 16  Temp: 97.9 F (36.6 C) 97.8 F (36.6 C)  SpO2: 99% 98%    Intake/Output Summary (Last 24 hours) at 01/29/2020 0915 Last data filed at 01/28/2020 1448 Gross per 24 hour  Intake --  Output 650 ml  Net -650 ml   Filed Weights   01/27/20 0511 01/28/20 0434 01/29/20 0502  Weight: (!) 98.2 kg (!) 104 kg (!) 103.2 kg   Body mass index is 31.73 kg/m.   Physical Exam  GENERAL: Patient is alert awake and  oriented. Not in obvious distress.  Obese HENT: Mild pallor noted. Pupils equally reactive to light. Oral mucosa is moist NECK: is supple, no gross swelling noted. CHEST: Clear to auscultation. No crackles or wheezes.   CVS: S1 and S2 heard, no murmur. Regular rate and rhythm.  ABDOMEN: Soft, non-tender, bowel sounds are present.  External urinary catheter in place. EXTREMITIES: Right upper extremity edema noted.  Nontender on palpation.  Bilateral lower extremities with Unna boot. CNS: Cranial nerves are intact. No focal motor deficits. SKIN: warm and dry without rashes.  Data Review: I have personally reviewed the following laboratory data and studies,  CBC: Recent Labs  Lab 01/25/20 2104 01/25/20 2104 01/26/20 0256 01/26/20 0529 01/26/20 1659 01/27/20 0126 01/27/20 0852 01/28/20 0704 01/29/20 0226  WBC 7.9  --  7.9  --   --  7.8  --  7.0 7.7  NEUTROABS 6.2  --   --   --   --   --   --   --   --   HGB 9.4*   < > 9.2*   < > 9.1* 8.1* 8.5* 7.5* 7.0*  HCT 31.3*   < > 30.6*   < > 28.0* 25.4* 26.4* 23.7* 22.5*  MCV 97.5  --  97.1  --   --  93.4  --  97.1 97.0  PLT 255  --  289  --   --  228  --  232 271   < > = values in this interval not displayed.   Basic Metabolic Panel: Recent Labs  Lab 01/25/20 2104 01/26/20 0256 01/27/20 0126 01/28/20 0704 01/29/20 0226  NA 141 141 142 140 137  K 4.4 3.8 4.2 3.6 3.6  CL 109 110 111 110 107  CO2 23 23 24 22 23   GLUCOSE 188* 139* 110* 117* 153*  BUN 38* 35* 37* 36* 34*  CREATININE 1.93* 1.75* 1.83* 1.85* 2.16*  CALCIUM 8.0* 7.9* 7.7* 7.6* 7.6*  MG  --  2.2 2.1  --   --   PHOS  --   --  3.7  --   --    Liver Function Tests: Recent Labs  Lab 01/25/20 2104  AST 23  ALT 17  ALKPHOS 156*  BILITOT 1.0  PROT 5.3*  ALBUMIN 1.9*   No results for input(s): LIPASE, AMYLASE in the last 168 hours. No results for input(s): AMMONIA in the last 168 hours. Cardiac Enzymes: No results for input(s): CKTOTAL, CKMB, CKMBINDEX, TROPONINI  in the last 168 hours. BNP (last 3 results) Recent Labs    09/09/19 0320 12/07/19 0833  BNP 820.0* 2,150.1*    ProBNP (last 3 results) No results for input(s): PROBNP in the last 8760 hours.  CBG: No results for input(s): GLUCAP in the last 168 hours. Recent Results (from the past 240 hour(s))  SARS CORONAVIRUS 2 (TAT 6-24 HRS)  Nasopharyngeal Nasopharyngeal Swab     Status: None   Collection Time: 01/28/20  2:17 PM   Specimen: Nasopharyngeal Swab  Result Value Ref Range Status   SARS Coronavirus 2 NEGATIVE NEGATIVE Final    Comment: (NOTE) SARS-CoV-2 target nucleic acids are NOT DETECTED.  The SARS-CoV-2 RNA is generally detectable in upper and lower respiratory specimens during the acute phase of infection. Negative results do not preclude SARS-CoV-2 infection, do not rule out co-infections with other pathogens, and should not be used as the sole basis for treatment or other patient management decisions. Negative results must be combined with clinical observations, patient history, and epidemiological information. The expected result is Negative.  Fact Sheet for Patients: HairSlick.no  Fact Sheet for Healthcare Providers: quierodirigir.com  This test is not yet approved or cleared by the [place] FDA and  has been authorized for detection and/or diagnosis of SARS-CoV-2 by FDA under an Emergency Use Authorization (EUA). This EUA will remain  in effect (meaning this test can be used) for the duration of the COVID-19 declaration under Se ction 564(b)(1) of the Act, 21 U.S.C. section 360bbb-3(b)(1), unless the authorization is terminated or revoked sooner.  Performed at [facility], [address] [place], [place] [address]      Studies: No results found.    Joycelyn Das, MD  [facility] 01/29/2020

## 2020-01-30 LAB — BASIC METABOLIC PANEL
Anion gap: 7 (ref 5–15)
BUN: 31 mg/dL — ABNORMAL HIGH (ref 8–23)
CO2: 22 mmol/L (ref 22–32)
Calcium: 7.7 mg/dL — ABNORMAL LOW (ref 8.9–10.3)
Chloride: 107 mmol/L (ref 98–111)
Creatinine, Ser: 1.73 mg/dL — ABNORMAL HIGH (ref 0.61–1.24)
GFR calc Af Amer: 43 mL/min — ABNORMAL LOW (ref >60)
GFR calc non Af Amer: 37 mL/min — ABNORMAL LOW (ref >60)
Glucose, Bld: 117 mg/dL — ABNORMAL HIGH (ref 70–99)
Potassium: 3.6 mmol/L (ref 3.5–5.1)
Sodium: 136 mmol/L (ref 135–145)

## 2020-01-30 LAB — TYPE AND SCREEN
ABO/RH(D): A POS
Antibody Screen: NEGATIVE
Unit division: 0

## 2020-01-30 LAB — BPAM RBC
Blood Product Expiration Date: 202108242359
ISSUE DATE / TIME: 202107312128
Unit Type and Rh: 6200

## 2020-01-30 LAB — CBC
HCT: 24.8 % — ABNORMAL LOW (ref 39.0–52.0)
Hemoglobin: 7.8 g/dL — ABNORMAL LOW (ref 13.0–17.0)
MCH: 29.4 pg (ref 26.0–34.0)
MCHC: 31.5 g/dL (ref 30.0–36.0)
MCV: 93.6 fL (ref 80.0–100.0)
Platelets: 249 10*3/uL (ref 150–400)
RBC: 2.65 MIL/uL — ABNORMAL LOW (ref 4.22–5.81)
RDW: 17.9 % — ABNORMAL HIGH (ref 11.5–15.5)
WBC: 8.5 10*3/uL (ref 4.0–10.5)
nRBC: 0 % (ref 0.0–0.2)

## 2020-01-30 MED ORDER — TIMOLOL MALEATE 0.5 % OP SOLN
1.0000 [drp] | Freq: Two times a day (BID) | OPHTHALMIC | Status: DC
  Administered 2020-01-30 – 2020-02-03 (×9): 1 [drp] via OPHTHALMIC
  Filled 2020-01-30: qty 5

## 2020-01-30 MED ORDER — POTASSIUM CHLORIDE CRYS ER 20 MEQ PO TBCR
20.0000 meq | EXTENDED_RELEASE_TABLET | Freq: Every day | ORAL | Status: DC
  Administered 2020-01-31 – 2020-02-03 (×4): 20 meq via ORAL
  Filled 2020-01-30 (×4): qty 1

## 2020-01-30 MED ORDER — LIDOCAINE 5 % EX PTCH: 1.0000 | MEDICATED_PATCH | Freq: Every day | CUTANEOUS | Status: DC | PRN

## 2020-01-30 MED ORDER — MELATONIN 5 MG PO TABS
5.0000 mg | ORAL_TABLET | Freq: Every day | ORAL | Status: DC
  Administered 2020-01-30 – 2020-02-03 (×5): 5 mg via ORAL
  Filled 2020-01-30 (×5): qty 1

## 2020-01-30 MED ORDER — POLYETHYLENE GLYCOL 3350 17 G PO PACK
17.0000 g | PACK | Freq: Every day | ORAL | Status: DC
  Administered 2020-02-03: 17 g via ORAL
  Filled 2020-01-30 (×3): qty 1

## 2020-01-30 NOTE — Progress Notes (Signed)
PROGRESS NOTE  Larry Lawrence [id number]:[id number] DOB: 15-May-1941 DOA: 01/25/2020 PCP: [name], MD   LOS: 4 days   Brief narrative: As per HPI,  79 year old male with past medical history of congestive heart failure, coronary artery disease, gout, TIA presented to the hospital from skilled nursing facility with complaints of rectal bleeding.  No prior history of rectal bleed or colonoscopy.  He did have some constipation prior to coming in the hospital.  In the ED, his hemoglobin was 9.4 with a 2 point drop from his previous hemoglobin levels.   Guaiac done by the ER physician which showed dark red blood.  He has occult positive stool.  Patient was then admitted to hospital for evaluation of GI bleed.  Assessment/Plan:  Principal Problem:   GI bleed Active Problems:   Chronic systolic CHF (congestive heart failure) (HCC)   CAD (coronary artery disease)   HTN (hypertension)   HLD (hyperlipidemia)   Acute blood loss anemia   GIB (gastrointestinal bleeding)   GI bleed with acute blood loss anemia. Likely diverticular bleed.  Has ceased.  Patient has not had a bloody bowel movement. Patient but had a bowel movement yesterday GI followed the patient during hospitalization and recommended conservative treatment. Status post 2 units of packed RBC . CT angiogram of the abdomen did not show any evidence of acute GI bleed but diverticulosis noted. 8.1>7.5>7.0 after transfusion 2 units of RBC.   GI has signed off at this time.  Will need outpatient colonoscopy. Received I additional unit of PRBC on 7/31 and hemoglobin today is 7.8. Patient has not had a bowel movement since yesterday and today.   Chronic systolic CHF  Last known last 2D echo 12/08/2019, EF 20 to 25%.  Status post 2 units of packed RBC.  Will closely monitor.  Watch for volume overload.  Currently compensated. Received 1 unit of packed RBC yesterday  Right upper extremity swelling Venous duplex negative for  DVT.  Essential HTN  Blood pressure ok.  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  We will closely monitor.  Avoid diuretics for now. Status post unit of packed RBC yesterday  Debility deconditioning.  Patient is from [facility].  Physical therapy recommend skilled nursing facility placement..   DVT prophylaxis: Place and maintain sequential compression device Start: 01/27/20 1426 Place and maintain sequential compression device Start: 01/27/20 1426 SCDs Start: 01/26/20 0123   Code Status: Full code  Family Communication: I spoke with the patient's daughter Ms [name] on the phone and updated her  Status is: inpatient  The patient is inpatient because: Unsafe d/c plan, , plan for skilled nursing facility placement.  Dispo: The patient is from: [facility]              Anticipated d/c is to: Skilled nursing facility              Anticipated d/c date is: 1 to 2 days              Patient currently is  medically stable to d/c.  Consultants:  GI   Procedures:  PRBC transfusion III units  Antibiotics:  . None  Anti-infectives (From admission, onward)   None      Subjective: Today, patient was seen and examined at bedside. Patient denies any bowel movements or rectal bleeding. Feels okay. Denies any chest pain shortness of breath cough fever   Objective:  Vitals:   01/30/20 0050 01/30/20 0758  BP: 109/72 (!) 120/57  Pulse:  94  Resp:  18  Temp: 98.5 F (36.9 C) 98.1 F (36.7 C)  SpO2: 96% 100%    Intake/Output Summary (Last 24 hours) at 01/30/2020 0922 Last data filed at 01/30/2020 0529 Gross per 24 hour  Intake 537 ml  Output 1300 ml  Net -763 ml   Filed Weights   01/27/20 0511 01/28/20 0434 01/29/20 0502  Weight: (!) 98.2 kg (!) 104 kg (!) 103.2 kg   Body mass index is 31.73 kg/m.   Physical Exam  GENERAL: Patient is alert awake and oriented. Not in obvious  distress.  Obese HENT: Mild pallor noted. Pupils equally reactive to light. Oral mucosa is moist NECK: is supple, no gross swelling noted. CHEST: Clear to auscultation. No crackles or wheezes.   CVS: S1 and S2 heard, no murmur. Regular rate and rhythm.  ABDOMEN: Soft, non-tender, bowel sounds are present.   EXTREMITIES: Right upper extremity edema noted.  Nontender on palpation.  Bilateral lower extremities with Unna boot CNS: Cranial nerves are intact. No focal motor deficits. SKIN: warm and dry,  Data Review: I have personally reviewed the following laboratory data and studies,  CBC: Recent Labs  Lab 01/25/20 2104 01/25/20 2104 01/26/20 0256 01/26/20 0529 01/27/20 0126 01/27/20 0852 01/28/20 0704 01/29/20 0226 01/30/20 0305  WBC 7.9   < > 7.9  --  7.8  --  7.0 7.7 8.5  NEUTROABS 6.2  --   --   --   --   --   --   --   --   HGB 9.4*   < > 9.2*   < > 8.1* 8.5* 7.5* 7.0* 7.8*  HCT 31.3*   < > 30.6*   < > 25.4* 26.4* 23.7* 22.5* 24.8*  MCV 97.5   < > 97.1  --  93.4  --  97.1 97.0 93.6  PLT 255   < > 289  --  228  --  232 271 249   < > = values in this interval not displayed.   Basic Metabolic Panel: Recent Labs  Lab 01/26/20 0256 01/27/20 0126 01/28/20 0704 01/29/20 0226 01/30/20 0305  NA 141 142 140 137 136  K 3.8 4.2 3.6 3.6 3.6  CL 110 111 110 107 107  CO2 23 24 22 23 22   GLUCOSE 139* 110* 117* 153* 117*  BUN 35* 37* 36* 34* 31*  CREATININE 1.75* 1.83* 1.85* 2.16* 1.73*  CALCIUM 7.9* 7.7* 7.6* 7.6* 7.7*  MG 2.2 2.1  --   --   --   PHOS  --  3.7  --   --   --    Liver Function Tests: Recent Labs  Lab 01/25/20 2104  AST 23  ALT 17  ALKPHOS 156*  BILITOT 1.0  PROT 5.3*  ALBUMIN 1.9*   No results for input(s): LIPASE, AMYLASE in the last 168 hours. No results for input(s): AMMONIA in the last 168 hours. Cardiac Enzymes: No results for input(s): CKTOTAL, CKMB, CKMBINDEX, TROPONINI in the last 168 hours. BNP (last 3 results) Recent Labs    09/09/19 0320  12/07/19 0833  BNP 820.0* 2,150.1*    ProBNP (last 3 results) No results for input(s): PROBNP in the last 8760 hours.  CBG: No results for input(s): GLUCAP in the last 168 hours. Recent Results (from the past 240 hour(s))  SARS CORONAVIRUS 2 (TAT 6-24 HRS) Nasopharyngeal Nasopharyngeal Swab     Status:  None   Collection Time: 01/28/20  2:17 PM   Specimen: Nasopharyngeal Swab  Result Value Ref Range Status   SARS Coronavirus 2 NEGATIVE NEGATIVE Final    Comment: (NOTE) SARS-CoV-2 target nucleic acids are NOT DETECTED.  The SARS-CoV-2 RNA is generally detectable in upper and lower respiratory specimens during the acute phase of infection. Negative results do not preclude SARS-CoV-2 infection, do not rule out co-infections with other pathogens, and should not be used as the sole basis for treatment or other patient management decisions. Negative results must be combined with clinical observations, patient history, and epidemiological information. The expected result is Negative.  Fact Sheet for Patients: HairSlick.no  Fact Sheet for Healthcare Providers: quierodirigir.com  This test is not yet approved or cleared by the [place] FDA and  has been authorized for detection and/or diagnosis of SARS-CoV-2 by FDA under an Emergency Use Authorization (EUA). This EUA will remain  in effect (meaning this test can be used) for the duration of the COVID-19 declaration under Se ction 564(b)(1) of the Act, 21 U.S.C. section 360bbb-3(b)(1), unless the authorization is terminated or revoked sooner.  Performed at [facility], [address], [place], [place] [address]      Studies: No results found.    Joycelyn Das, MD  [facility] 01/30/2020

## 2020-01-30 NOTE — Progress Notes (Signed)
MSW Intern checked patient's authorization and it is still pending.  TOC team will continue to assist with discharge planning needs.  Expected Discharge Plan: Skilled Nursing Facility  Barriers to Discharge: Continued Medical Work up

## 2020-01-31 LAB — CBC
HCT: 23.9 % — ABNORMAL LOW (ref 39.0–52.0)
Hemoglobin: 7.5 g/dL — ABNORMAL LOW (ref 13.0–17.0)
MCH: 29.1 pg (ref 26.0–34.0)
MCHC: 31.4 g/dL (ref 30.0–36.0)
MCV: 92.6 fL (ref 80.0–100.0)
Platelets: 275 10*3/uL (ref 150–400)
RBC: 2.58 MIL/uL — ABNORMAL LOW (ref 4.22–5.81)
RDW: 17.9 % — ABNORMAL HIGH (ref 11.5–15.5)
WBC: 9.2 10*3/uL (ref 4.0–10.5)
nRBC: 0 % (ref 0.0–0.2)

## 2020-01-31 LAB — HEMOGLOBIN AND HEMATOCRIT, BLOOD
HCT: 25.5 % — ABNORMAL LOW (ref 39.0–52.0)
Hemoglobin: 8 g/dL — ABNORMAL LOW (ref 13.0–17.0)

## 2020-01-31 MED ORDER — FUROSEMIDE 10 MG/ML IJ SOLN
40.0000 mg | Freq: Every day | INTRAMUSCULAR | Status: DC
  Administered 2020-01-31 – 2020-02-01 (×2): 40 mg via INTRAVENOUS
  Filled 2020-01-31 (×3): qty 4

## 2020-01-31 NOTE — Progress Notes (Signed)
Patient had a large amount of red bloody stool with clots. Notified Dr. Tyson Babinski. Orders for H & H given and instructed to monitor for further bleeding.

## 2020-01-31 NOTE — TOC Progression Note (Signed)
Transition of Care [facility]) - Progression Note    Patient Details  Name: GARTH DIFFLEY MRN: [id number] Date of Birth: 1941-05-20  Transition of [facility]) CM/SW Contact  Mearl Latin, LCSW Phone Number: 01/31/2020, 9:22 AM  Clinical Narrative:    CSW contacted [employer] to inquire about SNF approval and it is still pending.    Expected Discharge Plan: Skilled Nursing Facility Barriers to Discharge: Continued Medical Work up  Expected Discharge Plan and Services Expected Discharge Plan: Skilled Nursing Facility       Living arrangements for the past 2 months: Skilled Nursing Facility                                       Social Determinants of Health (SDOH) Interventions    Readmission Risk Interventions Readmission Risk Prevention Plan 01/28/2020  Social Work Consult for Recovery Care Planning/Counseling Complete  Some recent data might be hidden

## 2020-01-31 NOTE — Progress Notes (Signed)
PROGRESS NOTE  Larry Lawrence DDU:[id number] DOB: 1941-06-27 DOA: 01/25/2020 PCP: [name], MD   LOS: 5 days   Brief narrative: As per HPI,  79 year old male with past medical history of congestive heart failure, coronary artery disease, gout, TIA presented to the hospital from skilled nursing facility with complaints of rectal bleeding.  No prior history of rectal bleed or colonoscopy.  He did have some constipation prior to coming in the hospital.  In the ED, his hemoglobin was 9.4 with a 2 point drop from his previous hemoglobin levels.   Guaiac done by the ER physician which showed dark red blood.  He has occult positive stool.  Patient was then admitted to hospital for evaluation of GI bleed.  Assessment/Plan:  Principal Problem:   GI bleed Active Problems:   Chronic systolic CHF (congestive heart failure) (HCC)   CAD (coronary artery disease)   HTN (hypertension)   HLD (hyperlipidemia)   Acute blood loss anemia   GIB (gastrointestinal bleeding)   GI bleed with acute blood loss anemia. Likely diverticular bleed.  Has ceased.   GI followed the patient during hospitalization and recommended conservative treatment. Status post 3 units of packed RBC . CT angiogram of the abdomen did not show any evidence of acute GI bleed but diverticulosis noted.   Will need outpatient colonoscopy. Received I additional unit of PRBC on 7/31 and hemoglobin today is 7.5.    Chronic systolic CHF  Last known last 2D echo 12/08/2019, EF 20 to 25%.  Status post 2 units of packed RBC.  Will closely monitor.  Patient is not dyspneic but has been having swelling over his arm and scrotal area.  We will put the patient on Lasix 40 mg x 1.  Patient takes torsemide at home.  Right upper extremity swelling Venous duplex negative for DVT.  Essential HTN  Continue to monitor blood pressure.  On Coreg, hydralazine and torsemide at home.  Also on Imdur at home.   Mild AKI on Chronic kidney disease  IIa Creatinine of 1.7 on 01/30/20.start the patient on diuretics due to increasing edema in the extremities and scrotal area.  Debility deconditioning.  Patient is from [facility].  Physical therapy recommend skilled nursing facility placement.. Insurance pending at this time.  DVT prophylaxis: Place and maintain sequential compression device Start: 01/27/20 1426 Place and maintain sequential compression device Start: 01/27/20 1426 SCDs Start: 01/26/20 0123   Code Status: Full code  Family Communication: None today.  I spoke with the patient's daughter Ms [name] on the phone and updated her about the clinical condition of the patient yesterday  Status is: inpatient  The patient is inpatient because: Unsafe d/c plan, , plan for skilled nursing facility placement.  Dispo: The patient is from: [facility]              Anticipated d/c is to: Skilled nursing facility              Anticipated d/c date is: 1 to 2 days              Patient currently is  medically stable to d/c.  Resumed  Lasix today.  Consultants:  GI   Procedures:  PRBC transfusion III units  Antibiotics:  . None  Anti-infectives (From admission, onward)   None      Subjective: Today, patient was seen and examined at bedside.  Patient denies any shortness of breath dyspnea cough but complains of scrotal swelling and upper  extremity swelling.    Objective: Vitals:   01/31/20 0348 01/31/20 0800  BP:  119/79  Pulse: 85 84  Resp: 18 18  Temp: 98.6 F (37 C) 98.6 F (37 C)  SpO2: 94% 99%    Intake/Output Summary (Last 24 hours) at 01/31/2020 1305 Last data filed at 01/31/2020 0349 Gross per 24 hour  Intake 360 ml  Output 650 ml  Net -290 ml   Filed Weights   01/28/20 0434 01/29/20 0502 01/31/20 0348  Weight: (!) 104 kg (!) 103.2 kg (!) 102 kg   Body mass index is 31.36 kg/m.   Physical Exam  GENERAL: Patient is alert awake and oriented. Not in obvious distress.  Obese HENT: Mild pallor  noted. Pupils equally reactive to light. Oral mucosa is moist NECK: is supple, no gross swelling noted. CHEST: Clear to auscultation. No crackles or wheezes.   CVS: S1 and S2 heard, no murmur. Regular rate and rhythm.  ABDOMEN: Soft, non-tender, bowel sounds are present.  Scrotal edema noted EXTREMITIES: Bilateral upper extremity edema.  Nontender on palpation.  Bilateral lower extremities with Unna boot CNS: Cranial nerves are intact. No focal motor deficits. SKIN: warm and dry,  Data Review: I have personally reviewed the following laboratory data and studies,  CBC: Recent Labs  Lab 01/25/20 2104 01/26/20 0256 01/27/20 0126 01/27/20 0126 01/27/20 2585 01/28/20 0704 01/29/20 0226 01/30/20 0305 01/31/20 0344  WBC 7.9   < > 7.8  --   --  7.0 7.7 8.5 9.2  NEUTROABS 6.2  --   --   --   --   --   --   --   --   HGB 9.4*   < > 8.1*   < > 8.5* 7.5* 7.0* 7.8* 7.5*  HCT 31.3*   < > 25.4*   < > 26.4* 23.7* 22.5* 24.8* 23.9*  MCV 97.5   < > 93.4  --   --  97.1 97.0 93.6 92.6  PLT 255   < > 228  --   --  232 271 249 275   < > = values in this interval not displayed.   Basic Metabolic Panel: Recent Labs  Lab 01/26/20 0256 01/27/20 0126 01/28/20 0704 01/29/20 0226 01/30/20 0305  NA 141 142 140 137 136  K 3.8 4.2 3.6 3.6 3.6  CL 110 111 110 107 107  CO2 23 24 22 23 22   GLUCOSE 139* 110* 117* 153* 117*  BUN 35* 37* 36* 34* 31*  CREATININE 1.75* 1.83* 1.85* 2.16* 1.73*  CALCIUM 7.9* 7.7* 7.6* 7.6* 7.7*  MG 2.2 2.1  --   --   --   PHOS  --  3.7  --   --   --    Liver Function Tests: Recent Labs  Lab 01/25/20 2104  AST 23  ALT 17  ALKPHOS 156*  BILITOT 1.0  PROT 5.3*  ALBUMIN 1.9*   No results for input(s): LIPASE, AMYLASE in the last 168 hours. No results for input(s): AMMONIA in the last 168 hours. Cardiac Enzymes: No results for input(s): CKTOTAL, CKMB, CKMBINDEX, TROPONINI in the last 168 hours. BNP (last 3 results) Recent Labs    09/09/19 0320 12/07/19 0833   BNP 820.0* 2,150.1*    ProBNP (last 3 results) No results for input(s): PROBNP in the last 8760 hours.  CBG: No results for input(s): GLUCAP in the last 168 hours. Recent Results (from the past 240 hour(s))  SARS CORONAVIRUS 2 (TAT 6-24 HRS) Nasopharyngeal Nasopharyngeal  Swab     Status: None   Collection Time: 01/28/20  2:17 PM   Specimen: Nasopharyngeal Swab  Result Value Ref Range Status   SARS Coronavirus 2 NEGATIVE NEGATIVE Final    Comment: (NOTE) SARS-CoV-2 target nucleic acids are NOT DETECTED.  The SARS-CoV-2 RNA is generally detectable in upper and lower respiratory specimens during the acute phase of infection. Negative results do not preclude SARS-CoV-2 infection, do not rule out co-infections with other pathogens, and should not be used as the sole basis for treatment or other patient management decisions. Negative results must be combined with clinical observations, patient history, and epidemiological information. The expected result is Negative.  Fact Sheet for Patients: HairSlick.no  Fact Sheet for Healthcare Providers: quierodirigir.com  This test is not yet approved or cleared by the [place] FDA and  has been authorized for detection and/or diagnosis of SARS-CoV-2 by FDA under an Emergency Use Authorization (EUA). This EUA will remain  in effect (meaning this test can be used) for the duration of the COVID-19 declaration under Se ction 564(b)(1) of the Act, 21 U.S.C. section 360bbb-3(b)(1), unless the authorization is terminated or revoked sooner.  Performed at [facility], [address], [place], [place] [address]      Studies: No results found.    Joycelyn Das, MD  [facility] 01/31/2020

## 2020-01-31 NOTE — Progress Notes (Signed)
Both of pt arms are slightly weeping fluid- and swollen- the right more than the left- I wrapped both arms to keep his bed dry and propped both arms up on pillows to help with the swelling- will had this  Information off to on coming day shift RN.

## 2020-02-01 LAB — COMPREHENSIVE METABOLIC PANEL
ALT: 9 U/L (ref 0–44)
AST: 13 U/L — ABNORMAL LOW (ref 15–41)
Albumin: 1.8 g/dL — ABNORMAL LOW (ref 3.5–5.0)
Alkaline Phosphatase: 101 U/L (ref 38–126)
Anion gap: 7 (ref 5–15)
BUN: 30 mg/dL — ABNORMAL HIGH (ref 8–23)
CO2: 22 mmol/L (ref 22–32)
Calcium: 7.7 mg/dL — ABNORMAL LOW (ref 8.9–10.3)
Chloride: 106 mmol/L (ref 98–111)
Creatinine, Ser: 1.5 mg/dL — ABNORMAL HIGH (ref 0.61–1.24)
GFR calc Af Amer: 51 mL/min — ABNORMAL LOW (ref >60)
GFR calc non Af Amer: 44 mL/min — ABNORMAL LOW (ref >60)
Glucose, Bld: 109 mg/dL — ABNORMAL HIGH (ref 70–99)
Potassium: 3.5 mmol/L (ref 3.5–5.1)
Sodium: 135 mmol/L (ref 135–145)
Total Bilirubin: 1 mg/dL (ref 0.3–1.2)
Total Protein: 4.6 g/dL — ABNORMAL LOW (ref 6.5–8.1)

## 2020-02-01 LAB — HEMOGLOBIN AND HEMATOCRIT, BLOOD
HCT: 25.8 % — ABNORMAL LOW (ref 39.0–52.0)
HCT: 24.1 % — ABNORMAL LOW (ref 39.0–52.0)
HCT: 25.1 % — ABNORMAL LOW (ref 39.0–52.0)
HCT: 23.8 % — ABNORMAL LOW (ref 39.0–52.0)
Hemoglobin: 8.1 g/dL — ABNORMAL LOW (ref 13.0–17.0)
Hemoglobin: 7.7 g/dL — ABNORMAL LOW (ref 13.0–17.0)
Hemoglobin: 7.9 g/dL — ABNORMAL LOW (ref 13.0–17.0)
Hemoglobin: 7.6 g/dL — ABNORMAL LOW (ref 13.0–17.0)

## 2020-02-01 LAB — MAGNESIUM: Magnesium: 2 mg/dL (ref 1.7–2.4)

## 2020-02-01 NOTE — Consult Note (Signed)
   [facility] CM Inpatient Consult   02/01/2020  Larry Lawrence 1940-10-17 [id number]   Patient screened for high risk score for unplanned readmission. Chart reviewed to assess for potential [facility] Care Management community service needs. Per review, current disposition plan is for [facility].  No [name] Care Management follow up needs.  [name], MSN, RN [facility] Liaison Nurse Mobile Phone [phone]  Toll free office [phone]

## 2020-02-01 NOTE — Progress Notes (Addendum)
Daily Rounding Note  02/01/2020, 8:26 AM  LOS: 6 days      .SUBJECTIVE:   Chief complaint: passing blood clots per rectum   Seen x 2 d last week for limited BPR.        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  renal artery stenosis.  Sigmoid tics.    Hgb 7.8 >> 1 PRBC >> 8.5.  basline 11.5 the previous month.     Dr Barron Alvine suspected diverticular bleed which had concluded and deferred inpt wup, planned office fup and possible colonoscopy.    Had brown stool(s?) over weekend.  This AM: painless, large volume hematochezia with clots of red to burgundy blood.  No abd pain, no nausea.  Eating well.  Has been in bed for bulk of admission, non-ambulatory.    OBJECTIVE:         Vital signs in last 24 hours:    Temp:  [98.1 F (36.7 C)-98.5 F (36.9 C)] 98.2 F (36.8 C) (08/03 0650) Pulse Rate:  [87-102] 94 (08/03 0650) Resp:  [17-18] 17 (08/03 0650) BP: (92-118)/(63-81) 118/71 (08/03 0650) SpO2:  [96 %-100 %] 97 % (08/03 0650) Weight:  [101.5 kg] 101.5 kg (08/03 0650) Last BM Date: 01/30/20 Filed Weights   01/29/20 0502 01/31/20 0348 02/01/20 0650  Weight: (!) 103.2 kg (!) 102 kg 101.5 kg   General: frail, obese, comfortable, alert.  Pleasant.   Heart: Irreg, irreg, rate controlled.  A fib rate high 90s to low 100s on tele Chest: clear.  No labored resps, + cough. Abdomen: obese, NT, soft.  Active BS  Extremities: anasarca and massive 4 limb edema.  Legs ace wrapped knee to toes Neuro/Psych:  Alert, calm, pleasant.    Lab Results: Recent Labs    01/30/20 0305 01/30/20 0305 01/31/20 0344 01/31/20 0344 01/31/20 1954 02/01/20 0122 02/01/20 0656  WBC 8.5  --  9.2  --   --   --   --   HGB 7.8*   < > 7.5*   < > 8.0* 8.1* 7.7*  HCT 24.8*   < > 23.9*   < > 25.5* 25.8* 24.1*  PLT 249  --  275  --   --    --   --    < > = values in this interval not displayed.   BMET Recent Labs    01/30/20 0305 02/01/20 0656  NA 136 135  K 3.6 3.5  CL 107 106  CO2 22 22  GLUCOSE 117* 109*  BUN 31* 30*  CREATININE 1.73* 1.50*  CALCIUM 7.7* 7.7*   LFT Recent Labs    02/01/20 0656  PROT 4.6*  ALBUMIN 1.8*  AST 13*  ALT 9  ALKPHOS 101  BILITOT 1.0   PT/INR No results for input(s): LABPROT, INR in the last 72 hours. Hepatitis Panel No results for input(s): HEPBSAG, HCVAB, HEPAIGM, HEPBIGM in the last 72 hours.  Studies/Results: No results found.  ASSESMENT:   *  Limited bleeding PR.  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  renal artery stenosis.  Sigmoid tics.   Dr C suspected  diverticular bleed.    *   Blood loss anemia Hgb nadir 7.0 >> 8 >> 7.7.  S/p 3 PRBCs.  Baseline is ~ 11.5 one month ago.     *   Periph vasc disease.  CAD, cardiac stents.  CHF.  Previous Plavix and Coumadin. 81 ASA, no platelet agents or thinners PTA.     *   CHF.  OSA.  EF 20 to 25%. Anasarca and massive 4 limb edema. Weight of 94.8 in Mid June, 101.5 is current measurement (likley bed scale weight.     PLAN   *   Ideally would pursue colonoscopy but not clear it is safe for this pt to be sedated.   Since just one episode of the recurrent bleeding and not ongoing, suspect NM bleeding scan will be futile.   Will d/w Dr  Christella Hartigan.     Jennye Moccasin  02/01/2020, 8:26 AM Phone [phone]   ________________________________________________________________________  Cor[facility] MD note:  I personally examined the patient, reviewed the data and agree with the assessment and plan described above.  Recurrent overt painless lower GI bleeding.  Diverticulosis noted on recent CT abd.  Very possibly he's had diverticular bleeding, sputtering.  Usually a colonoscopy is recommended however he is  at HIGH RISK for procedures given severe CHF, anasarca, immobile status.  Will follow along, should transfuse as necessary. Should have goals of care discussion.  Not planning on invasive testing at this point.   Rob Bunting, MD [facility] Gastroenterology Pager [phone]

## 2020-02-01 NOTE — Progress Notes (Signed)
PROGRESS NOTE  Larry Lawrence IRJ:[id number] DOB: 1941-05-05 DOA: 01/25/2020 PCP: [name], MD   LOS: 6 days   Brief narrative: As per HPI,  79 year old male with past medical history of congestive heart failure, coronary artery disease, gout, TIA presented to the hospital from skilled nursing facility with complaints of rectal bleeding.  No prior history of rectal bleed or colonoscopy.  He did have some constipation prior to coming in the hospital.  In the ED, his hemoglobin was 9.4 with a 2.0 drop from his previous hemoglobin levels.   Guaiac done by the ER physician which showed dark red blood.  He had occult positive stool.  Patient was then admitted to hospital for evaluation of GI bleed.  Assessment/Plan:  Principal Problem:   GI bleed Active Problems:   Chronic systolic CHF (congestive heart failure) (HCC)   CAD (coronary artery disease)   HTN (hypertension)   HLD (hyperlipidemia)   Acute blood loss anemia   GIB (gastrointestinal bleeding)   GI bleed with acute blood loss anemia. Likely diverticular bleed.  Initially ceased but patient did have a large bowel movement with bright red blood and clots again on 01/31/2020.  H&H was ordered overnight.  No further bowel movements.  Spoke with GI for reconsultation this morning.  GI did not wish to proceed with invasive procedure including colonoscopy.  Patient's daughter also does not wish to have him go through invasive procedures.  Patient did have CT angiogram recently which did not show any bleeding.  Patient has received total of 3 units of packed RBC during this hospitalization.  Will transfuse more PRBC if hemoglobin drops less than 7 or for ongoing bleeding.   Chronic systolic CHF with anasarca scrotal edema Last known last 2D echo 12/08/2019, EF 20 to 25%.  Status post 3 units of packed RBC.  Has been having increasing swelling over his arm and scrotal area and thigh area/anasarca..  Patient takes torsemide at home which  was on hold due to GI bleed and acute kidney injury.  Has been restarted on IV Lasix since yesterday.  Creatinine on IV Lasix.  Creatinine of 1.5 today.  Right upper extremity swelling Present on admission.  Now with lower extremity edema worsening.  Venous duplex of the right upper extremity negative for DVT.  Essential HTN    On Coreg, hydralazine, Imdur and torsemide at home.  Torsemide changed to IV Lasix at this time.  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  Has been started on IV Lasix yesterday.  Monitor BMP  Debility deconditioning.  Patient is from [facility].  Physical therapy recommend skilled nursing facility placement.   DVT prophylaxis: Place and maintain sequential compression device Start: 01/27/20 1426 Place and maintain sequential compression device Start: 01/27/20 1426 SCDs Start: 01/26/20 0123   Code Status: DO NOT RESUSCITATE.  Spoke with the patient's daughter who indicated his CODE STATUS.  Ethics I spoke with the patient's daughter, Ms [name] on the phone and updated her about the clinical condition of the patient today.  Patient is a poor candidate for invasive work-up/family wishes not to do further work-up.  He does have gross peripheral edema/anasarca/heart failure hypoalbuminemia, and significant decline in his physical health over time (as per the patient's daughter) with recurrent diverticular bleed at this time.  Patient has poor overall prognosis and outcome.  This was communicated with the patient's daughter at length who does not wish his father to  undergo invasive work-up.  She understands the limited quality life he has and wishes  discussion regarding palliative care/hospice care.  Will consult palliative care at this time.  Status is: inpatient  The patient is inpatient because: Unsafe d/c plan, , plan for skilled nursing facility placement, palliative care consultation and discussion.  Dispo:  The patient is from: [facility]              Anticipated d/c is to: Skilled nursing facility/hospice/SNF with palliative care              Anticipated d/c date is: 1 to 2 days              Patient currently is  not medically stable to d/c.  Transfuse PRBC if he continues to bleed or hemoglobin drops.  Continue  IV Lasix.  Consultants:  GI   Procedures:  PRBC transfusion III units  Antibiotics:  . None  Anti-infectives (From admission, onward)   None      Subjective: Today, patient was seen and examined at bedside.  Had large bright red blood per rectum with clots as per the nursing staff at bedside.  Has been having weeping edema and increasing scrotal lower extremity swelling.  Denies cough chest pain or dyspnea.  Objective: Vitals:   02/01/20 1031 02/01/20 1157  BP: 106/69 105/67  Pulse:  95  Resp:  18  Temp:  98.4 F (36.9 C)  SpO2:  98%    Intake/Output Summary (Last 24 hours) at 02/01/2020 1423 Last data filed at 02/01/2020 0650 Gross per 24 hour  Intake 360 ml  Output 1150 ml  Net -790 ml   Filed Weights   01/29/20 0502 01/31/20 0348 02/01/20 0650  Weight: (!) 103.2 kg (!) 102 kg 101.5 kg   Body mass index is 31.21 kg/m.   Physical Exam  GENERAL: Patient is alert awake and oriented. Not in obvious distress.  Obese HENT: Mild pallor noted. Pupils equally reactive to light. Oral mucosa is moist NECK: is supple, no gross swelling noted. CHEST: Clear to auscultation. No crackles or wheezes.   CVS: S1 and S2 heard, no murmur. Regular rate and rhythm.  ABDOMEN: Soft, non-tender, bowel sounds are present.  Scrotal edema noted EXTREMITIES: Bilateral upper extremity edema.  Anasarca with scrotal edema thigh edema.  Nontender on palpation.  Bilateral lower extremities with Unna boot CNS: Cranial nerves are intact. No focal motor deficits. SKIN: warm and dry,  Data Review: I have personally reviewed the following laboratory data and studies,  CBC: Recent Labs   Lab Feb 04, 2020 2104 01/26/20 0256 01/27/20 0126 01/27/20 6659 01/28/20 0704 01/28/20 0704 01/29/20 0226 01/29/20 0226 01/30/20 0305 01/30/20 0305 01/31/20 0344 01/31/20 1954 02/01/20 0122 02/01/20 0656 02/01/20 1219  WBC 7.9   < > 7.8  --  7.0  --  7.7  --  8.5  --  9.2  --   --   --   --   NEUTROABS 6.2  --   --   --   --   --   --   --   --   --   --   --   --   --   --   HGB 9.4*   < > 8.1*   < > 7.5*   < > 7.0*   < > 7.8*   < > 7.5* 8.0* 8.1* 7.7* 7.9*  HCT 31.3*   < > 25.4*   < > 23.7*   < >  22.5*   < > 24.8*   < > 23.9* 25.5* 25.8* 24.1* 25.1*  MCV 97.5   < > 93.4  --  97.1  --  97.0  --  93.6  --  92.6  --   --   --   --   PLT 255   < > 228  --  232  --  271  --  249  --  275  --   --   --   --    < > = values in this interval not displayed.   Basic Metabolic Panel: Recent Labs  Lab 01/26/20 0256 01/26/20 0256 01/27/20 0126 01/28/20 0704 01/29/20 0226 01/30/20 0305 02/01/20 0122 02/01/20 0656  NA 141   < > 142 140 137 136  --  135  K 3.8   < > 4.2 3.6 3.6 3.6  --  3.5  CL 110   < > 111 110 107 107  --  106  CO2 23   < > 24 22 23 22   --  22  GLUCOSE 139*   < > 110* 117* 153* 117*  --  109*  BUN 35*   < > 37* 36* 34* 31*  --  30*  CREATININE 1.75*   < > 1.83* 1.85* 2.16* 1.73*  --  1.50*  CALCIUM 7.9*   < > 7.7* 7.6* 7.6* 7.7*  --  7.7*  MG 2.2  --  2.1  --   --   --  2.0  --   PHOS  --   --  3.7  --   --   --   --   --    < > = values in this interval not displayed.   Liver Function Tests: Recent Labs  Lab 01/25/20 2104 02/01/20 0656  AST 23 13*  ALT 17 9  ALKPHOS 156* 101  BILITOT 1.0 1.0  PROT 5.3* 4.6*  ALBUMIN 1.9* 1.8*   No results for input(s): LIPASE, AMYLASE in the last 168 hours. No results for input(s): AMMONIA in the last 168 hours. Cardiac Enzymes: No results for input(s): CKTOTAL, CKMB, CKMBINDEX, TROPONINI in the last 168 hours. BNP (last 3 results) Recent Labs    09/09/19 0320 12/07/19 0833  BNP 820.0* 2,150.1*    ProBNP  (last 3 results) No results for input(s): PROBNP in the last 8760 hours.  CBG: No results for input(s): GLUCAP in the last 168 hours. Recent Results (from the past 240 hour(s))  SARS CORONAVIRUS 2 (TAT 6-24 HRS) Nasopharyngeal Nasopharyngeal Swab     Status: None   Collection Time: 01/28/20  2:17 PM   Specimen: Nasopharyngeal Swab  Result Value Ref Range Status   SARS Coronavirus 2 NEGATIVE NEGATIVE Final    Comment: (NOTE) SARS-CoV-2 target nucleic acids are NOT DETECTED.  The SARS-CoV-2 RNA is generally detectable in upper and lower respiratory specimens during the acute phase of infection. Negative results do not preclude SARS-CoV-2 infection, do not rule out co-infections with other pathogens, and should not be used as the sole basis for treatment or other patient management decisions. Negative results must be combined with clinical observations, patient history, and epidemiological information. The expected result is Negative.  Fact Sheet for Patients: HairSlick.nohttps://www.fda.gov/media/138098/download  Fact Sheet for Healthcare Providers: quierodirigir.comhttps://www.fda.gov/media/138095/download  This test is not yet approved or cleared by the [place] FDA and  has been authorized for detection and/or diagnosis of SARS-CoV-2 by FDA under an Emergency Use Authorization (EUA). This EUA will remain  in effect (meaning this test can be used) for the duration of the COVID-19 declaration under Se ction 564(b)(1) of the Act, 21 U.S.C. section 360bbb-3(b)(1), unless the authorization is terminated or revoked sooner.  Performed at [facility], [address] [place], [place] [address]      Studies: No results found.    Joycelyn Das, MD  [facility] 02/01/2020

## 2020-02-01 NOTE — TOC Progression Note (Addendum)
Transition of Care [facility]) - Progression Note    Patient Details  Name: Larry Lawrence MRN: [id number] Date of Birth: [birth year]  [facility] CM/SW Contact  Terrial Rhodes, LCSWA Phone Number: 02/01/2020, 11:51 AM  Clinical Narrative:     Update 3:09pm-CSW spoke with Chales Abrahams with [facility] and Chales Abrahams will reach out to family for possible palliative services/Hospice for patient.  CSW will continue to follow.  Update 2:48pm- Physician let CSW know that family is wanting palliative/hospice services for patient. Physician requested for CSW to coordinate for patient. CSW called Eben Burow with [facility] and left voicemail to return call. CSW awaiting callback to make referral for patient.  CSW will continue to follow.  CSW called [employer] to follow up on insurance authorization for patient. Patients insurance authorization is still pending and is under Wellsite geologist review. CSW called [facility] where patient is long term resident. Everardo Pacific with [facility] said patient is good to discharge back today if medically ready. Everardo Pacific said they will follow insurance authorization there. Everardo Pacific said if insurance authorization does not get approved that it is okay because patient is long term there. CSW notified physician to let him know that patient has SNF bed at [facility] when medically ready for discharge.  CSW will continue to follow.  Expected Discharge Plan: Skilled Nursing Facility Barriers to Discharge: Continued Medical Work up  Expected Discharge Plan and Services Expected Discharge Plan: Skilled Nursing Facility       Living arrangements for the past 2 months: Skilled Nursing Facility                                       Social Determinants of Health (SDOH) Interventions    Readmission Risk Interventions Readmission Risk Prevention Plan 01/28/2020  Social Work Consult for Recovery Care Planning/Counseling Complete  Some recent data  might be hidden

## 2020-02-01 NOTE — Progress Notes (Signed)
Physical Therapy Treatment Patient Details Name: Larry Lawrence MRN: [id number] DOB: 09/03/1940 Today's Date: 02/01/2020    History of Present Illness Pt is a 79 y.o. M with significant PMH of CHF, CAD, TIA, who presents from SNF with GIB with acute blood loss anemia; likely diverticular bleed and monitoring conservatively.    PT Comments    Pt was seen for mobility on bed to scoot up and reposition, but with min assist only.  Tolerated bed ex well with assist to move R knee, but is making improvements that are leaving him in better position to use RLE with all standing activity soon.  Follow acutely for strength, ROM and balance skills to enhance his abiltiy to transfer and take steps.   Follow Up Recommendations  SNF     Equipment Recommendations  None recommended by PT    Recommendations for Other Services       Precautions / Restrictions Precautions Precautions: Fall Precaution Comments: monitor telemetry values Restrictions Weight Bearing Restrictions: No    Mobility  Bed Mobility Overal bed mobility: Needs Assistance             General bed mobility comments: declined OOB but was min assist to scoot up in the bed  Transfers                 General transfer comment: declined  Ambulation/Gait             General Gait Details: unable   Stairs             Wheelchair Mobility    Modified Rankin (Stroke Patients Only)       Balance Overall balance assessment: History of Falls;Needs assistance Sitting-balance support: Feet supported Sitting balance-Leahy Scale: Fair                                      Cognition Arousal/Alertness: Awake/alert Behavior During Therapy: WFL for tasks assessed/performed Overall Cognitive Status: Impaired/Different from baseline Area of Impairment: Safety/judgement;Following commands;Memory                     Memory: Decreased recall of precautions;Decreased short-term  memory Following Commands: Follows one step commands inconsistently;Follows one step commands with increased time Safety/Judgement: Decreased awareness of safety;Decreased awareness of deficits            Exercises General Exercises - Lower Extremity Ankle Circles/Pumps: AROM;5 reps Quad Sets: AROM;10 reps Gluteal Sets: AROM;10 reps Heel Slides: AROM;AAROM;10 reps Hip ABduction/ADduction: AROM;AAROM;10 reps Straight Leg Raises: AAROM;10 reps Hip Flexion/Marching: AROM;AAROM;10 reps    General Comments General comments (skin integrity, edema, etc.): pt reports he hurt R knee being dropped in SNF      Pertinent Vitals/Pain Pain Assessment: Faces Faces Pain Scale: Hurts a little bit Pain Location: R knee Pain Descriptors / Indicators: Guarding Pain Intervention(s): Monitored during session;Premedicated before session;Repositioned    Home Living                      Prior Function            PT Goals (current goals can now be found in the care plan section) Acute Rehab PT Goals Patient Stated Goal: return to SNF Progress towards PT goals: Progressing toward goals    Frequency    Min 2X/week      PT Plan Current plan remains appropriate    Co-evaluation  AM-PAC PT "6 Clicks" Mobility   Outcome Measure  Help needed turning from your back to your side while in a flat bed without using bedrails?: A Little Help needed moving from lying on your back to sitting on the side of a flat bed without using bedrails?: A Little Help needed moving to and from a bed to a chair (including a wheelchair)?: A Lot Help needed standing up from a chair using your arms (e.g., wheelchair or bedside chair)?: Total Help needed to walk in hospital room?: Total Help needed climbing 3-5 steps with a railing? : Total 6 Click Score: 11    End of Session   Activity Tolerance: Patient limited by fatigue Patient left: in bed;with call bell/phone within  reach Nurse Communication: Mobility status PT Visit Diagnosis: Pain;Other abnormalities of gait and mobility (R26.89);Muscle weakness (generalized) (M62.81) Pain - Right/Left: Right Pain - part of body: Knee     Time: 1350-1413 PT Time Calculation (min) (ACUTE ONLY): 23 min  Charges:  $Therapeutic Exercise: 23-37 mins                  Ivar Drape 02/01/2020, 4:16 PM   Samul Dada, PT MS Acute Rehab Dept. Number: [facility] [phone] and [facility] [phone]

## 2020-02-01 NOTE — Progress Notes (Addendum)
Patent examiner [facility])  Hospital Liaison: RN note       Notified by [facility] manager of patient/family request for [facility] Palliative services at home after discharge.       Spoke with Roanna Banning  to confirm interest and explain services. Sharia Reeve mentions that they are unsure if they would like Palliative care vs Hospice care so I explained both. He is leaning more towards hospice at this point with no further treatment but would like to speak with palliative team in the hospital first. Josh voices concern about going to SNF because from his understanding they would continue to send to the hospital if patient has another bleed. Family also voiced concerns about not being able to take care of him at home. He also voiced that patient is unable to make his own decisions so would like for team to call him or his mother for any decisions made.        [facility] will follow up during hospitalization and once decision has been made discuss DME needs at that time.       Please call with any hospice or palliative related questions.       Thank you for this referral.       Yolande Jolly, RN, BSN [facility] Liaison (listed on AMION under [facility] [place])   [phone]

## 2020-02-02 DIAGNOSIS — K922 Gastrointestinal hemorrhage, unspecified: Secondary | ICD-10-CM

## 2020-02-02 DIAGNOSIS — Z7189 Other specified counseling: Secondary | ICD-10-CM

## 2020-02-02 DIAGNOSIS — R531 Weakness: Secondary | ICD-10-CM

## 2020-02-02 DIAGNOSIS — Z515 Encounter for palliative care: Secondary | ICD-10-CM

## 2020-02-02 LAB — BASIC METABOLIC PANEL
Anion gap: 9 (ref 5–15)
BUN: 32 mg/dL — ABNORMAL HIGH (ref 8–23)
CO2: 22 mmol/L (ref 22–32)
Calcium: 7.8 mg/dL — ABNORMAL LOW (ref 8.9–10.3)
Chloride: 108 mmol/L (ref 98–111)
Creatinine, Ser: 1.73 mg/dL — ABNORMAL HIGH (ref 0.61–1.24)
GFR calc Af Amer: 43 mL/min — ABNORMAL LOW (ref >60)
GFR calc non Af Amer: 37 mL/min — ABNORMAL LOW (ref >60)
Glucose, Bld: 104 mg/dL — ABNORMAL HIGH (ref 70–99)
Potassium: 3.7 mmol/L (ref 3.5–5.1)
Sodium: 139 mmol/L (ref 135–145)

## 2020-02-02 LAB — HEMOGLOBIN AND HEMATOCRIT, BLOOD
HCT: 22.9 % — ABNORMAL LOW (ref 39.0–52.0)
HCT: 27.5 % — ABNORMAL LOW (ref 39.0–52.0)
Hemoglobin: 7.2 g/dL — ABNORMAL LOW (ref 13.0–17.0)
Hemoglobin: 8.6 g/dL — ABNORMAL LOW (ref 13.0–17.0)

## 2020-02-02 MED ORDER — TORSEMIDE 20 MG PO TABS
20.0000 mg | ORAL_TABLET | Freq: Every day | ORAL | Status: DC
  Administered 2020-02-03: 20 mg via ORAL
  Filled 2020-02-02: qty 1

## 2020-02-02 MED ORDER — TRAZODONE HCL 50 MG PO TABS
50.0000 mg | ORAL_TABLET | Freq: Every day | ORAL | Status: DC
  Administered 2020-02-02 – 2020-02-03 (×3): 50 mg via ORAL
  Filled 2020-02-02 (×3): qty 1

## 2020-02-02 MED ORDER — FUROSEMIDE 10 MG/ML IJ SOLN: 20.0000 mg | Freq: Once | INTRAMUSCULAR | Status: DC

## 2020-02-02 NOTE — Progress Notes (Addendum)
Daily Rounding Note  02/02/2020, 10:37 AM  LOS: 7 days   SUBJECTIVE:   Chief complaint:  Painless hematochzia.     No BMs, no bleeding per rectum for at least 24 hours. Patient is eating okay.  Not complaining of abdominal pain, rectal pain, nausea. BPs soft, 90s/60s currently.  No tachycardia Read note from [employer].  Pt's son Larry Lawrence veering toward hospice care and no further aggressive care but wants to talk w pall care to help family (son and pt's wife) decide.       OBJECTIVE:         Vital signs in last 24 hours:    Temp:  [98.2 F (36.8 C)-99.3 F (37.4 C)] 99.3 F (37.4 C) (08/04 0803) Pulse Rate:  [85-96] 89 (08/04 0803) Resp:  [16-19] 18 (08/04 0803) BP: (99-107)/(52-77) 102/68 (08/04 0803) SpO2:  [96 %-100 %] 97 % (08/04 0803) Weight:  [102.2 kg] 102.2 kg (08/04 0528) Last BM Date: 01/31/20 Filed Weights   01/31/20 0348 02/01/20 0650 02/02/20 0528  Weight: (!) 102 kg 101.5 kg 102.2 kg   General:.acutely, chronically ill looking but comfortable sitting up in bed Heart: Irreg, Irreg,  Chest: fine rales R base, diminished at Lbase.  No increased WOB, no cough Abdomen: soft, NT, ND.  BS normal quality but hypoactive  Extremities: 4 limb edema, anasarca Neuro/Psych:  Alert, appropriate, calm.    Intake/Output from previous day: 08/03 0701 - 08/04 0700 In: 360 [P.O.:360] Out: 600 [Urine:600]  Intake/Output this shift: Total I/O In: 240 [P.O.:240] Out: -   Lab Results: Recent Labs    01/31/20 0344 01/31/20 1954 02/01/20 1219 02/01/20 1628 02/02/20 0451  WBC 9.2  --   --   --   --   HGB 7.5*   < > 7.9* 7.6* 7.2*  HCT 23.9*   < > 25.1* 23.8* 22.9*  PLT 275  --   --   --   --    < > = values in this interval not displayed.   BMET Recent Labs    02/01/20 0656 02/02/20 0451  NA 135 139  K 3.5 3.7  CL 106 108  CO2 22 22  GLUCOSE 109* 104*  BUN 30* 32*  CREATININE 1.50* 1.73*  CALCIUM 7.7*  7.8*   LFT Recent Labs    02/01/20 0656  PROT 4.6*  ALBUMIN 1.8*  AST 13*  ALT 9  ALKPHOS 101  BILITOT 1.0   PT/INR No results for input(s): LABPROT, INR in the last 72 hours. Hepatitis Panel No results for input(s): HEPBSAG, HCVAB, HEPAIGM, HEPBIGM in the last 72 hours.  Studies/Results: No results found.   Scheduled Meds: . sodium chloride   Intravenous Once  . sodium chloride   Intravenous Once  . acetaminophen  650 mg Oral Once  . amiodarone  200 mg Oral Daily  . carvedilol  25 mg Oral BID  . furosemide  40 mg Intravenous Daily  . hydrALAZINE  25 mg Oral BID  . isosorbide mononitrate  30 mg Oral Daily  . melatonin  5 mg Oral QHS  . montelukast  10 mg Oral QHS  . polyethylene glycol  17 g Oral Daily  . potassium chloride SA  20 mEq Oral Daily  . timolol  1 drop Both Eyes BID  . traZODone  50 mg Oral QHS   Continuous Infusions: PRN Meds:.albuterol, lidocaine   ASSESMENT:   *   Painless hematochezia.    *  Blood loss anemia 3 PRBCs to date, last on 8/1.   Hgb trending slowly down.    *   CHF, LVEF 20%.  Massive anasarca  *   Advanced deconditioning, bed bound.    *   Hypoalbuminemia at 1.8.  Certainly contributing to anasarca.     PLAN   *   Approaching a "hands off" level of care so discontinued orders for bid H&H, continue w daily AM CBC.  No plans for colonoscopy at present.  Await Pall care consult.      Jennye Moccasin  02/02/2020, 10:37 AM Phone [phone]   ________________________________________________________________________  [facility] MD note:  I personally examined the patient, reviewed the data and agree with the assessment and plan described above. Bleeding seems to have stopped, hopefully will not recur because he is high risk of having complication from any invasive procedures given his acute, chronic conditions.  Will follow along.   Rob Bunting, MD [facility] Pager [phone]

## 2020-02-02 NOTE — TOC Progression Note (Addendum)
Transition of Care [facility]) - Progression Note    Patient Details  Name: Larry Lawrence MRN: [id number] Date of Birth: Nov 02, 1940  [facility] CM/SW Contact  Terrial Rhodes, LCSWA Phone Number: 02/02/2020, 11:41 AM  Clinical Narrative:     CSW spoke with [facility]. [facility] said family is wanting patient to go back to SNF with hospice services. CSW called [place] with [facility] to let her know plan. [facility] told CSW to cancel insurance authorization for patient. It is not needed for patient to return. CSW called [employer] to cancel insurance authorization.CSW called patients daughter [name] to confirm discharge plans with her. CSW let Tammy know that she will be back in touch with her when patient is ready for discharge.  Patient has SNF bed at [facility] with hospice services to follow when medically ready.  CSW will continue to follow.  Expected Discharge Plan: Skilled Nursing Facility Barriers to Discharge: Continued Medical Work up  Expected Discharge Plan and Services Expected Discharge Plan: Skilled Nursing Facility       Living arrangements for the past 2 months: Skilled Nursing Facility                                       Social Determinants of Health (SDOH) Interventions    Readmission Risk Interventions Readmission Risk Prevention Plan 01/28/2020  Social Work Consult for Recovery Care Planning/Counseling Complete  Some recent data might be hidden

## 2020-02-02 NOTE — Progress Notes (Signed)
PROGRESS NOTE    Larry Lawrence  RKY:[id number] DOB: 1940-10-11 DOA: 01/25/2020 PCP: [name], MD   Brief Narrative: Larry Lawrence is a 79 y.o. male with past medical history of congestive heart failure, coronary artery disease, gout, TIA. Patient presented secondary to GI bleed with associated blood loss anemia. GI consulted but recommendations for no invasive procedures. Plan for patient to discharge back to SNF with hospice.   Assessment & Plan:   Principal Problem:   GI bleed Active Problems:   Chronic systolic CHF (congestive heart failure) (HCC)   CAD (coronary artery disease)   HTN (hypertension)   HLD (hyperlipidemia)   Acute blood loss anemia   GIB (gastrointestinal bleeding)   GI bleed Acute blood loss anemia Unknown etiology but likely diverticular. He has required 3 units of PRBC to date. GI consulted however recommendations for no invasive procedure secondary to comorbidities. CT angiogram performed without identification of bleed. Bleeding appears to have stopped. -Hemoglobin and hematocrit this afternoon and transfuse for hemoglobin less than 7  Chronic systolic heart failure Patient with significant anasarca but is stable. EF of 20-25%. Patient is on Coreg, Imdur, torsemide as an outpatient. -Continue Imdur, Coreg  Pressure injury Medial coccyx, not present on admission.   DVT prophylaxis: SCDs Code Status:   Code Status: DNR Family Communication: None at bedside Disposition Plan: Discharge likely in 24 hours to SNF with hospice   Consultants:   Gastroenterology  Palliative care medicine  Procedures:   None  Antimicrobials:  None    Subjective: No issues overnight. He has noticed some slightly worsened swelling.  Objective: Vitals:   02/02/20 0007 02/02/20 0528 02/02/20 0803 02/02/20 1204  BP: (!) 107/52 101/74 102/68 96/62  Pulse: 95 92 89 (!) 102  Resp: 19 16 18 18   Temp: 98.3 F (36.8 C) 98.3 F (36.8 C) 99.3 F  (37.4 C) 98.6 F (37 C)  TempSrc: Oral Oral Oral Oral  SpO2: 100% 96% 97% 99%  Weight:  102.2 kg    Height:        Intake/Output Summary (Last 24 hours) at 02/02/2020 1556 Last data filed at 02/02/2020 1205 Gross per 24 hour  Intake 840 ml  Output 600 ml  Net 240 ml   Filed Weights   01/31/20 0348 02/01/20 0650 02/02/20 0528  Weight: (!) 102 kg 101.5 kg 102.2 kg    Examination:  General exam: Appears calm and comfortable Respiratory system: Clear to auscultation. Respiratory effort normal. Cardiovascular system: S1 & S2 heard, RRR. Gastrointestinal system: Abdomen is nondistended, soft and nontender. No organomegaly or masses felt. Normal bowel sounds heard. Central nervous system: Alert and oriented. No focal neurological deficits. Musculoskeletal: Anasarca. No calf tenderness Skin: No cyanosis. No rashes Psychiatry: Judgement and insight appear normal. Mood & affect appropriate.     Data Reviewed: I have personally reviewed following labs and imaging studies  CBC Lab Results  Component Value Date   WBC 9.2 01/31/2020   RBC 2.58 (L) 01/31/2020   HGB 7.2 (L) 02/02/2020   HCT 22.9 (L) 02/02/2020   MCV 92.6 01/31/2020   MCH 29.1 01/31/2020   PLT 275 01/31/2020   MCHC 31.4 01/31/2020   RDW 17.9 (H) 01/31/2020   LYMPHSABS 0.9 01/25/2020   MONOABS 0.6 01/25/2020   EOSABS 0.1 01/25/2020   BASOSABS 0.0 01/25/2020     Last metabolic panel Lab Results  Component Value Date   NA 139 02/02/2020   K 3.7 02/02/2020   CL 108 02/02/2020  CO2 22 02/02/2020   BUN 32 (H) 02/02/2020   CREATININE 1.73 (H) 02/02/2020   GLUCOSE 104 (H) 02/02/2020   GFRNONAA 37 (L) 02/02/2020   GFRAA 43 (L) 02/02/2020   CALCIUM 7.8 (L) 02/02/2020   PHOS 3.7 01/27/2020   PROT 4.6 (L) 02/01/2020   ALBUMIN 1.8 (L) 02/01/2020   LABGLOB 2.7 07/21/2018   AGRATIO 1.1 07/21/2018   BILITOT 1.0 02/01/2020   ALKPHOS 101 02/01/2020   AST 13 (L) 02/01/2020   ALT 9 02/01/2020   ANIONGAP 9  02/02/2020    CBG (last 3)  No results for input(s): GLUCAP in the last 72 hours.   GFR: Estimated Creatinine Clearance: 43.5 mL/min (A) (by C-G formula based on SCr of 1.73 mg/dL (H)).  Coagulation Profile: No results for input(s): INR, PROTIME in the last 168 hours.  Recent Results (from the past 240 hour(s))  SARS CORONAVIRUS 2 (TAT 6-24 HRS) Nasopharyngeal Nasopharyngeal Swab     Status: None   Collection Time: 01/28/20  2:17 PM   Specimen: Nasopharyngeal Swab  Result Value Ref Range Status   SARS Coronavirus 2 NEGATIVE NEGATIVE Final    Comment: (NOTE) SARS-CoV-2 target nucleic acids are NOT DETECTED.  The SARS-CoV-2 RNA is generally detectable in upper and lower respiratory specimens during the acute phase of infection. Negative results do not preclude SARS-CoV-2 infection, do not rule out co-infections with other pathogens, and should not be used as the sole basis for treatment or other patient management decisions. Negative results must be combined with clinical observations, patient history, and epidemiological information. The expected result is Negative.  Fact Sheet for Patients: HairSlick.no  Fact Sheet for Healthcare Providers: quierodirigir.com  This test is not yet approved or cleared by the [place] FDA and  has been authorized for detection and/or diagnosis of SARS-CoV-2 by FDA under an Emergency Use Authorization (EUA). This EUA will remain  in effect (meaning this test can be used) for the duration of the COVID-19 declaration under Se ction 564(b)(1) of the Act, 21 U.S.C. section 360bbb-3(b)(1), unless the authorization is terminated or revoked sooner.  Performed at [facility], [address], [place], [place] [address]         Radiology Studies: No results found.      Scheduled Meds: . sodium chloride   Intravenous Once  . sodium chloride   Intravenous Once  .  acetaminophen  650 mg Oral Once  . amiodarone  200 mg Oral Daily  . carvedilol  25 mg Oral BID  . furosemide  40 mg Intravenous Daily  . hydrALAZINE  25 mg Oral BID  . isosorbide mononitrate  30 mg Oral Daily  . melatonin  5 mg Oral QHS  . montelukast  10 mg Oral QHS  . polyethylene glycol  17 g Oral Daily  . potassium chloride SA  20 mEq Oral Daily  . timolol  1 drop Both Eyes BID  . traZODone  50 mg Oral QHS   Continuous Infusions:   LOS: 7 days     Jacquelin Hawking, MD [facility] 02/02/2020, 3:56 PM  If 7PM-7AM, please contact night-coverage [email or web address]

## 2020-02-02 NOTE — Consult Note (Signed)
Consultation Note Date: 02/02/2020   Patient Name: Larry Lawrence  DOB: 1941-03-23  MRN: [id number]  Age / Sex: 79 y.o., male  PCP: Larry Lawrence Referring Physician: Narda BondsNettey, Ralph A, Lawrence  Reason for Consultation: Establishing goals of care  HPI/Patient Profile: 79 y.o. male  with past medical history of CHF EF 20-25%, CAD, gout, TIA admitted on 01/25/2020 with rectal bleeding. Hgb of 9.4 with occult positive stool. GI consulted and did not recommend invasive workup including colonoscopy due to underlying conditions. Patient's daughter also does not wish to have him go through invasive procedures. Patient received 3 units PRBC. Initially ceased but patient did have a large bowel movement with bright red blood and clots on 8/2. Hgb 7.2 on 8/4. Also with chronic systolic CHF EF 20-25%. Receiving IV lasix. Creatinine 1.5. Palliative medicine consultation for goals of care.   Clinical Assessment and Goals of Care:  I have reviewed medical records, discussed with care team, and assessed patient at bedside. Larry Lawrence is in good spirits this morning. He is receiving a bath from nurse tech. He denies pain or shortness of breath. No family at bedside.   Conference call with patient's daughter Larry Lawrence(Larry Lawrence) and Larry Lawrence [phone](Larry Lawrence) for GOC discussion.   I introduced Palliative Medicine as specialized medical care for people living with serious illness. It focuses on providing relief from the symptoms and stress of a serious illness. The goal is to improve quality of life for both the patient and the family.  We discussed a brief life review of the patient. Larry Lawrence reports CHF and CKD for many years. He was living with Larry Lawrence until recently. He has now been at Larry Lawrence under rehab for about 2 months. His health has continued to decline with swelling, fluid overload, poor oral intake, and poor progression with therapy.  Essentially bed bound now.   Discussed events leading up to admission and course of hospitalization including diagnoses, interventions, plan of care. Larry Lawrence and Larry Lawrence have a good understanding of Larry Lawrence's conditions and high risk for recurrent hospitalization due to heart failure and also GI bleeding. Larry Lawrence yesterday understanding her father will continue to get worse and medical recommendation for comfort and palliative/hospice route. Larry Lawrence and Larry Lawrence agree they do not wish to for Larry Lawrence to continue to be hospitalized as his health declines.    The difference between aggressive medical intervention and comfort care was considered in light of the patient's goals of care.   Discussed outpatient palliative versus hospice options and philosophy in detail. Unfortunately home is not an option. Larry Lawrence are hopeful for Larry Lawrence to discharge back to Larry Lawrence and are ready to start hospice services on discharge, understanding philosophy. (Spoke with social worker, Larry Lawrence who does confirm the patient is under long-term care at Larry Lawrence). Larry Lawrence and Larry Lawrence confirm DNR code status. They also confirm decision against aggressive workup/procedures for further GI bleeding.   Larry Lawrence and Larry Lawrence are agreeable for another blood transfusion inpatient if  necessary (deferred to Larry Lawrence) but they do understand that he would not continue to receive aggressive IVF, ABX or blood transfusions once discharged to SNF with hospice. Emphasized focus on comfort, symptom management, nature taking course, and preventing recurrent hospitalization.   Questions and concerns were addressed. PMT contact information given.     SUMMARY OF RECOMMENDATIONS    DNR. Continue current plan of care and medical optimization inpatient. Family ok with another blood transfusion inpatient if necessary. Deferred to attending.  Family declines aggressive GI workup/procedures.  Understanding hospice  philosophy, family is ready to initiate hospice services at Larry Lawrence on discharge. Discussed with TOC team. Updated Larry Lawrence.   PMT will continue to follow inpatient.   Code Status/Advance Care Planning:  DNR  Symptom Management:   No current symptom needs.  Palliative Prophylaxis:   Aspiration, Delirium Protocol, Frequent Pain Assessment, Oral Care and Turn Reposition  Psycho-social/Spiritual:   Desire for further Chaplaincy support: yes  Additional Recommendations: Caregiving  Support/Resources, Compassionate Wean Education and Education on Hospice  Prognosis:   Poor long-term prognosis  Discharge Planning: LTC facility with hospice services     Primary Diagnoses: Present on Admission: . GI bleed . CAD (coronary artery disease) . Chronic systolic CHF (congestive heart failure) (HCC) . HTN (hypertension) . HLD (hyperlipidemia) . GIB (gastrointestinal bleeding)   I have reviewed the medical record, interviewed the patient and family, and examined the patient. The following aspects are pertinent.  Past Medical History:  Diagnosis Date  . Allergy   . CHF (congestive heart failure) (HCC)   . Coronary artery disease   . GI bleeding 12/2019  . Gout   . Insomnia   . Renal disorder   . Sleeps in sitting position due to orthopnea   . TIA (transient ischemic attack)    Social History   Socioeconomic History  . Marital status: Widowed    Spouse name: Not on file  . Number of children: 2  . Years of education: Not on file  . Highest education level: 12th grade  Occupational History  . Occupation: retired  Tobacco Use  . Smoking status: Former Smoker    Packs/day: 2.00    Years: 3.00    Pack years: 6.00    Types: Cigarettes    Quit date: 1960    Years since quitting: 61.6  . Smokeless tobacco: Never Used  . Tobacco comment: Smoking cessation materials not required  Vaping Use  . Vaping Use: Never used  Substance and Sexual Activity  . Alcohol  use: No  . Drug use: No  . Sexual activity: Not Currently  Other Topics Concern  . Not on file  Social History Narrative  . Not on file   Social Determinants of Health   Financial Resource Strain:   . Difficulty of Paying Living Expenses:   Food Insecurity:   . Worried About Programme researcher, broadcasting/film/video in the Last Year:   . Barista in the Last Year:   Transportation Needs:   . Freight forwarder (Medical):   Marland Kitchen Lack of Transportation (Non-Medical):   Physical Activity:   . Days of Exercise per Week:   . Minutes of Exercise per Session:   Stress:   . Feeling of Stress :   Social Connections:   . Frequency of Communication with Friends and Family:   . Frequency of Social Gatherings with Friends and Family:   . Attends Religious Services:   . Active Member of Clubs  or Organizations:   . Attends Banker Meetings:   Marland Kitchen Marital Status:    Family History  Problem Relation Age of Onset  . Hypertension Mother   . Pancreatic cancer Mother   . Hypertension Father   . Prostate cancer Father    Scheduled Meds: . sodium chloride   Intravenous Once  . sodium chloride   Intravenous Once  . acetaminophen  650 mg Oral Once  . amiodarone  200 mg Oral Daily  . carvedilol  25 mg Oral BID  . furosemide  40 mg Intravenous Daily  . hydrALAZINE  25 mg Oral BID  . isosorbide mononitrate  30 mg Oral Daily  . melatonin  5 mg Oral QHS  . montelukast  10 mg Oral QHS  . polyethylene glycol  17 g Oral Daily  . potassium chloride SA  20 mEq Oral Daily  . timolol  1 drop Both Eyes BID  . traZODone  50 mg Oral QHS   Continuous Infusions: PRN Meds:.albuterol, lidocaine Medications Prior to Admission:  Prior to Admission medications   Medication Sig Start Date End Date Taking? Authorizing Provider  acetaminophen (TYLENOL) 500 MG tablet Take 1,000 mg by mouth every 8 (eight) hours.   Yes Provider, Historical, Lawrence  albuterol (VENTOLIN HFA) 108 (90 Base) MCG/ACT inhaler Inhale 2  puffs into the lungs in the morning, at noon, and at bedtime.   Yes Provider, Historical, Lawrence  allopurinol (ZYLOPRIM) 300 MG tablet TAKE (1) TABLET BY MOUTH a day Patient taking differently: Take 300 mg by mouth daily.  06/09/19  Yes Duanne Limerick, Lawrence  amiodarone (PACERONE) 200 MG tablet Take 200 mg by mouth daily. Dr Suzzanne Cloud, Lawrence  aspirin 81 MG EC tablet Take 81 mg by mouth daily.    Yes Provider, Historical, Lawrence  carvedilol (COREG) 12.5 MG tablet Take 2 tablets (25 mg total) by mouth 2 (two) times daily. Dr Gwen Pounds 09/13/19 01/24/29 Yes Enedina Finner, Lawrence  hydrALAZINE (APRESOLINE) 25 MG tablet Take 25 mg by mouth 2 (two) times daily.   Yes Provider, Historical, Lawrence  isosorbide mononitrate (IMDUR) 30 MG 24 hr tablet Take 30 mg by mouth daily. kowalski   Yes Provider, Historical, Lawrence  Lidocaine (ASPERCREME LIDOCAINE) 4 % PTCH Apply 1 patch topically daily as needed (to both knees).   Yes Provider, Historical, Lawrence  melatonin 5 MG TABS Take 5 mg by mouth at bedtime.   Yes Provider, Historical, Lawrence  montelukast (SINGULAIR) 10 MG tablet TAKE (1) TABLET BY MOUTH DAILY AT BEDTIME Patient taking differently: Take 10 mg by mouth at bedtime.  06/09/19  Yes Duanne Limerick, Lawrence  polyethylene glycol (MIRALAX / GLYCOLAX) 17 g packet Take 17 g by mouth daily.   Yes Provider, Historical, Lawrence  Potassium Chloride ER 20 MEQ TBCR Take 20 mEq by mouth daily.    Yes Provider, Historical, Lawrence  timolol (TIMOPTIC) 0.5 % ophthalmic solution Lawrence 1 drop into both eyes 2 (two) times daily.   Yes Provider, Historical, Lawrence  torsemide (DEMADEX) 20 MG tablet Take 20 mg by mouth daily.   Yes Provider, Historical, Lawrence   Allergies  Allergen Reactions  . Niaspan [Niacin Er] Other (See Comments)    Reaction:  Hot flashes    Review of Systems  Constitutional: Positive for activity change.   Physical Exam Vitals and nursing note reviewed.  Constitutional:      General: He is awake.     Appearance: He is  ill-appearing.  HENT:     Head: Normocephalic and atraumatic.  Cardiovascular:     Rate and Rhythm: Normal rate.  Pulmonary:     Effort: No tachypnea, accessory muscle usage or respiratory distress.     Breath sounds: Normal breath sounds.  Abdominal:     Tenderness: There is no abdominal tenderness.  Skin:    General: Skin is warm and dry.  Neurological:     Mental Status: He is alert.     Comments: Oriented to person/Lawrence    Vital Signs: BP 96/62 (BP Location: Left Arm)   Pulse (!) 102   Temp 98.6 F (37 C) (Oral)   Resp 18   Ht 5\' 11"  (1.803 m)   Wt 102.2 kg   SpO2 99%   BMI 31.42 kg/m  Pain Scale: 0-10   Pain Score: 0-No pain   SpO2: SpO2: 99 % O2 Device:SpO2: 99 % O2 Flow Rate: .   IO: Intake/output summary:   Intake/Output Summary (Last 24 hours) at 02/02/2020 1639 Last data filed at 02/02/2020 1205 Gross per 24 hour  Intake 840 ml  Output 600 ml  Net 240 ml    LBM: Last BM Date: 01/31/20 Baseline Weight: Weight: (!) 95 kg Most recent weight: Weight: 102.2 kg     Palliative Assessment/Data: PPS 30%   Flowsheet Rows     Most Recent Value  Intake Tab  Referral Department Hospitalist  Unit at Time of Referral Med/Surg Unit  Palliative Care Primary Diagnosis Cardiac  Palliative Care Type New Palliative care  Reason for referral Clarify Goals of Care  Date first seen by Palliative Care 02/02/20  Clinical Assessment  Palliative Performance Scale Score 30%  Psychosocial & Spiritual Assessment  Palliative Care Outcomes  Patient/Family meeting held? Yes  Who was at the meeting? daughter and grandson  Palliative Care Outcomes Clarified goals of care, Counseled regarding hospice, Provided end of life care assistance, Provided psychosocial or spiritual support, ACP counseling assistance      Time In: 1000 Time Out: 1100 Time Total: 60 Greater than 50%  of this time was spent counseling and coordinating care related to the above assessment and  plan.  Signed by:  04/03/20, DNP, FNP-C Palliative Medicine Team  Phone: [phone] Fax: [phone]   Please contact Palliative Medicine Team phone at [phone] for questions and concerns.  For individual provider: See [phone]

## 2020-02-02 NOTE — Progress Notes (Addendum)
Patent examiner [facility]) Hospital Liaison: RN note     Notified by Transition of Care Manger Alfredo Bach, CSW of patient/family request for [facility] services at [facility] after discharge. Chart and patient information under review by [facility] physician. Hospice eligibility confirmed.     Writer spoke with  Daughter, [name]  to initiate education related to hospice philosophy, services and team approach to care.           verbalized understanding of information given.   Please send signed and completed DNR form home with patient/family. Patient will need prescriptions for discharge comfort medications.         [facility] aware of the above. Please notify [facility] when patient is ready to leave the unit at discharge. (Call [phone] or [phone] after 5pm.) [facility] information and contact numbers given to Tammy.      Please call with any hospice related questions.      Thank you for this referral.     Elsie Saas, RN, [facility] (listed on [facility] under [facility])   [phone]

## 2020-02-03 LAB — CBC
HCT: 25.3 % — ABNORMAL LOW (ref 39.0–52.0)
Hemoglobin: 8 g/dL — ABNORMAL LOW (ref 13.0–17.0)
MCH: 30 pg (ref 26.0–34.0)
MCHC: 31.6 g/dL (ref 30.0–36.0)
MCV: 94.8 fL (ref 80.0–100.0)
Platelets: 336 10*3/uL (ref 150–400)
RBC: 2.67 MIL/uL — ABNORMAL LOW (ref 4.22–5.81)
RDW: 17.4 % — ABNORMAL HIGH (ref 11.5–15.5)
WBC: 8.6 10*3/uL (ref 4.0–10.5)
nRBC: 0 % (ref 0.0–0.2)

## 2020-02-03 LAB — SARS CORONAVIRUS 2 BY RT PCR (HOSPITAL ORDER, PERFORMED IN ~~LOC~~ HOSPITAL LAB): SARS Coronavirus 2: NEGATIVE

## 2020-02-03 NOTE — Progress Notes (Signed)
Physical Therapy Treatment Patient Details Name: Larry Lawrence MRN: [id number] DOB: 10-02-1940 Today's Date: 02/03/2020    History of Present Illness Pt is a 79 y.o. M with significant PMH of CHF, CAD, TIA, who presents from SNF with GIB with acute blood loss anemia; likely diverticular bleed and monitoring conservatively.    PT Comments    Pt agreeable to participate with therapy and attempt transfer to chair using stedy. On attempt to rise from EOB, pt was unable to clear buttocks fully to allow for transfer to stedy. Attempted standing 2x before pt fatigued and was returned to supine. Pt is very weak in both UEs and LEs and would benefit from continued skilled PT to maximize functional independence and activity tolerance. Pt's extremities swollen and RN notified of skin tear on wrist, likely cause by ID bracelet. Will continue to follow acutely.     Follow Up Recommendations  SNF     Equipment Recommendations  None recommended by PT    Recommendations for Other Services       Precautions / Restrictions Precautions Precautions: Fall Precaution Comments: monitor telemetry values Restrictions Weight Bearing Restrictions: No    Mobility  Bed Mobility Overal bed mobility: Needs Assistance Bed Mobility: Sit to Supine;Supine to Sit;Rolling Rolling: Mod assist;+2 for physical assistance   Supine to sit: Max assist;+2 for physical assistance Sit to supine: Max assist;+2 for physical assistance   General bed mobility comments: Pt progressed to EOB with max A +2. Multimodal cues for hand placement. Pt very weak and unable to pull up using hand rails.  Transfers Overall transfer level: Needs assistance   Transfers: Sit to/from Stand Sit to Stand: Max assist;+2 physical assistance;+2 safety/equipment         General transfer comment: Attempted 2x to stand with steady and max A +2. Pt unable to fully clear buttocks off EOB to sit on stedy.  Ambulation/Gait              General Gait Details: unable   Stairs             Wheelchair Mobility    Modified Rankin (Stroke Patients Only)       Balance Overall balance assessment: History of Falls;Needs assistance Sitting-balance support: Feet supported Sitting balance-Leahy Scale: Poor Sitting balance - Comments: Significant posterior lean once seated EOB, required mod A to come upright. Did much better with stedy in front to have something to reach for. Postural control: Posterior lean   Standing balance-Leahy Scale: Zero                              Cognition Arousal/Alertness: Awake/alert Behavior During Therapy: WFL for tasks assessed/performed Overall Cognitive Status: Impaired/Different from baseline Area of Impairment: Safety/judgement;Following commands;Memory                     Memory: Decreased recall of precautions;Decreased short-term memory Following Commands: Follows one step commands inconsistently;Follows one step commands with increased time Safety/Judgement: Decreased awareness of safety;Decreased awareness of deficits     General Comments: Perseverating on being set back and how he used to walk 43ft a day.       Exercises      General Comments        Pertinent Vitals/Pain Pain Assessment: Faces Faces Pain Scale: Hurts a little bit Pain Location: R knee Pain Descriptors / Indicators: Guarding Pain Intervention(s): Monitored during session;Limited activity within patient's tolerance  Home Living                      Prior Function            PT Goals (current goals can now be found in the care plan section) Acute Rehab PT Goals Patient Stated Goal: return to SNF PT Goal Formulation: With patient Time For Goal Achievement: 02/11/20 Potential to Achieve Goals: Fair Progress towards PT goals: Progressing toward goals    Frequency    Min 2X/week      PT Plan Current plan remains appropriate    Co-evaluation               AM-PAC PT "6 Clicks" Mobility   Outcome Measure  Help needed turning from your back to your side while in a flat bed without using bedrails?: A Little Help needed moving from lying on your back to sitting on the side of a flat bed without using bedrails?: A Little Help needed moving to and from a bed to a chair (including a wheelchair)?: A Lot Help needed standing up from a chair using your arms (e.g., wheelchair or bedside chair)?: Total Help needed to walk in hospital room?: Total Help needed climbing 3-5 steps with a railing? : Total 6 Click Score: 11    End of Session Equipment Utilized During Treatment: Gait belt Activity Tolerance:  (limited by weakness) Patient left: in bed;with call bell/phone within reach Nurse Communication: Mobility status (condom cath came off) PT Visit Diagnosis: Pain;Other abnormalities of gait and mobility (R26.89);Muscle weakness (generalized) (M62.81) Pain - Right/Left: Right Pain - part of body: Knee     Time: 7672-0947 PT Time Calculation (min) (ACUTE ONLY): 31 min  Charges:  $Therapeutic Activity: 23-37 mins                     Kallie Locks, [place] Pager [phone] Acute Rehab   Sheral Apley 02/03/2020, 3:39 PM

## 2020-02-03 NOTE — TOC Transition Note (Signed)
Transition of Care [facility]) - CM/SW Discharge Note   Patient Details  Name: [name] MRN: [id number] Date of Birth: [birth year]  [facility] CM/SW Contact:  Jimmy Picket, LCSWA Phone Number: 02/03/2020, 3:23 PM   Clinical Narrative:     Pt will discharge back to [facility] via [facility]. Pt daughter [name] has been notified. Tammy request to remind pt to take his cell phone with him at discharge.  Pt will go to room 805A. Nurse to call report to [phone].  Final next level of care: Skilled Nursing Facility Barriers to Discharge: Barriers Resolved   Patient Goals and CMS Choice Patient states their goals for this hospitalization and ongoing recovery are:: Return to [facility] CMS Medicare.gov Compare Post Acute Care list provided to:: Patient Choice offered to / list presented to : Patient  Discharge Placement              Patient chooses bed at: [facility] Patient to be transferred to facility by: PTAR Name of family member notified: Tammy, daughter Patient and family notified of of transfer: 02/03/20  Discharge Plan and Services                                     Social Determinants of Health (SDOH) Interventions     Readmission Risk Interventions Readmission Risk Prevention Plan 01/28/2020  Social Work Consult for Recovery Care Planning/Counseling Complete  Some recent data might be hidden     Jimmy Picket, Theresia Majors, [facility] Clinical Social Worker [phone]

## 2020-02-03 NOTE — Progress Notes (Signed)
Report called to nurse at [facility]. Pt taken to [facility] via [facility].

## 2020-02-03 NOTE — Care Management Important Message (Signed)
Important Message  Patient Details  Name: Larry Lawrence MRN: [id number] Date of Birth: Apr 01, 1941   Medicare Important Message Given:  Yes     Renie Ora 02/03/2020, 12:07 PM

## 2020-02-03 NOTE — Plan of Care (Signed)
  Problem: Education: Goal: Knowledge of General Education information will improve Description Including pain rating scale, medication(s)/side effects and non-pharmacologic comfort measures Outcome: Progressing   Problem: Health Behavior/Discharge Planning: Goal: Ability to manage health-related needs will improve Outcome: Progressing   

## 2020-02-03 NOTE — Discharge Summary (Signed)
Physician Discharge Summary  Larry Lawrence DQQ:[id number] DOB: 27-Nov-1940 DOA: 01/25/2020  PCP: [name], MD  Admit date: 01/25/2020 Discharge date: 02/03/2020  Admitted From: SNF Disposition: SNF  Recommendations for Outpatient Follow-up:  1. SNF with hospice 2. Please follow up on the following pending results: None  Discharge Condition: Stable CODE STATUS: DNR Diet recommendation: Heart healthy   Brief/Interim Summary:  Admission HPI written by Gery Pray, MD   Chief Complaint:  Rectal bleeding  HPI: 79 year old male resident of a nursing home.  He presents to the ER after it was noted he had rectal bleeding.  Per patient he said that this started last night.  He was not much blood but he states recurred today and was a bit worse.  He states he has never had rectal bleeding before.  He states was bright red blood per rectum.  He denies any pain.  He states his never had a colonoscopy.  He denies any significant problems with constipation.  He said his appetite has been good but just a bit decreased lately.  In the ER his hemoglobin is down to 9.4.  Which represents a 2 unit drop.  The hospitalist have been asked admit.  Guaiac done by the ER physician which shows a dark red blood.  He has occult positive stool.  Hospital course:  GI bleed Acute blood loss anemia Unknown etiology but likely diverticular. He has required 3 units of PRBC to date. GI consulted however recommendations for no invasive procedure secondary to comorbidities. CT angiogram performed without identification of bleed. Bleeding appears to have stopped and hemoglobin stable.  Chronic systolic heart failure Patient with significant anasarca but is stable. EF of 20-25%. Patient is on Coreg, Imdur, torsemide as an outpatient. Continue Imdur, Coreg and torsemide. Potassium supplementation while on torsemide.  Pressure injury Medial coccyx, not present on admission.  Discharge Diagnoses:   Principal Problem:   GI bleed Active Problems:   Chronic systolic CHF (congestive heart failure) (HCC)   CAD (coronary artery disease)   HTN (hypertension)   HLD (hyperlipidemia)   Weakness   Acute blood loss anemia   GIB (gastrointestinal bleeding)    Discharge Instructions   Allergies as of 02/03/2020      Reactions   Niaspan [niacin Er] Other (See Comments)   Reaction:  Hot flashes       Medication List    STOP taking these medications   aspirin 81 MG EC tablet     TAKE these medications   acetaminophen 500 MG tablet Commonly known as: TYLENOL Take 1,000 mg by mouth every 8 (eight) hours.   albuterol 108 (90 Base) MCG/ACT inhaler Commonly known as: VENTOLIN HFA Inhale 2 puffs into the lungs in the morning, at noon, and at bedtime.   allopurinol 300 MG tablet Commonly known as: ZYLOPRIM TAKE (1) TABLET BY MOUTH a day What changed:   how much to take  how to take this  when to take this  additional instructions   amiodarone 200 MG tablet Commonly known as: PACERONE Take 200 mg by mouth daily. Dr Gwen Pounds   Aspercreme Lidocaine 4 % Ptch Generic drug: Lidocaine Apply 1 patch topically daily as needed (to both knees).   carvedilol 12.5 MG tablet Commonly known as: Coreg Take 2 tablets (25 mg total) by mouth 2 (two) times daily. Dr Gwen Pounds   hydrALAZINE 25 MG tablet Commonly known as: APRESOLINE Take 25 mg by mouth 2 (two) times daily.   isosorbide  mononitrate 30 MG 24 hr tablet Commonly known as: IMDUR Take 30 mg by mouth daily. kowalski   melatonin 5 MG Tabs Take 5 mg by mouth at bedtime.   montelukast 10 MG tablet Commonly known as: SINGULAIR TAKE (1) TABLET BY MOUTH DAILY AT BEDTIME What changed:   how much to take  how to take this  when to take this  additional instructions   polyethylene glycol 17 g packet Commonly known as: MIRALAX / GLYCOLAX Take 17 g by mouth daily.   Potassium Chloride ER 20 MEQ Tbcr Take 20 mEq by  mouth daily.   timolol 0.5 % ophthalmic solution Commonly known as: TIMOPTIC Place 1 drop into both eyes 2 (two) times daily.   torsemide 20 MG tablet Commonly known as: DEMADEX Take 20 mg by mouth daily.       Allergies  Allergen Reactions  . Niaspan [Niacin Er] Other (See Comments)    Reaction:  Hot flashes     Consultations:  Gastroenterology  Palliative care medicine   Procedures/Studies: DG Chest Port 1 View  Result Date: 01/25/2020 CLINICAL DATA:  Weakness EXAM: PORTABLE CHEST 1 VIEW COMPARISON:  December 07, 2019 FINDINGS: There is unchanged moderate cardiomegaly. Aortic knob calcifications are seen. Mildly increased interstitial markings seen throughout lungs however improved since the prior exam. A small bilateral effusions are seen, right greater than. IMPRESSION: Slight interval improvement in the interstitial edema. Small bilateral effusions, right greater. Electronically Signed   By: Jonna Clark M.D.   On: 01/25/2020 20:14   VAS [place] LOWER EXTREMITY VENOUS (DVT)  Result Date: 01/26/2020  Lower Venous DVTStudy Indications: Swelling, and Edema.  Limitations: Bandages. Comparison Study: no prior Performing Technologist: Blanch Media RVS  Examination Guidelines: A complete evaluation includes B-mode imaging, spectral Doppler, color Doppler, and power Doppler as needed of all accessible portions of each vessel. Bilateral testing is considered an integral part of a complete examination. Limited examinations for reoccurring indications may be performed as noted. The reflux portion of the exam is performed with the patient in reverse Trendelenburg.  +---------+---------------+---------+-----------+----------+-------------------+ RIGHT    CompressibilityPhasicitySpontaneityPropertiesThrombus Aging      +---------+---------------+---------+-----------+----------+-------------------+ CFV      Full           Yes      Yes                                       +---------+---------------+---------+-----------+----------+-------------------+ SFJ      Full                                                             +---------+---------------+---------+-----------+----------+-------------------+ FV Prox  Full                                                             +---------+---------------+---------+-----------+----------+-------------------+ FV Mid   Full                                                             +---------+---------------+---------+-----------+----------+-------------------+  FV DistalFull                                                             +---------+---------------+---------+-----------+----------+-------------------+ PFV      Full                                                             +---------+---------------+---------+-----------+----------+-------------------+ POP      Full           Yes      Yes                                      +---------+---------------+---------+-----------+----------+-------------------+ PTV                                                   not visualized due                                                        to bandaging        +---------+---------------+---------+-----------+----------+-------------------+ PERO                                                  not visualized due                                                        to bandaging        +---------+---------------+---------+-----------+----------+-------------------+   +----+---------------+---------+-----------+----------+--------------+ LEFTCompressibilityPhasicitySpontaneityPropertiesThrombus Aging +----+---------------+---------+-----------+----------+--------------+ CFV                                              Not visualized +----+---------------+---------+-----------+----------+--------------+     Summary: RIGHT: - There is no evidence of deep  vein thrombosis in the lower extremity.  - No cystic structure found in the popliteal fossa.   *See table(s) above for measurements and observations. Electronically signed by Gretta Began MD on 01/26/2020 at 5:18:46 PM.    Final    VAS [place] UPPER EXTREMITY VENOUS DUPLEX  Result Date: 01/26/2020 UPPER VENOUS STUDY  Indications: Edema Comparison Study: no prior Performing Technologist: Blanch Media RVS  Examination Guidelines: A complete evaluation includes B-mode imaging, spectral Doppler, color Doppler, and power Doppler as needed of all accessible portions of each vessel. Bilateral testing is considered an integral part of  a complete examination. Limited examinations for reoccurring indications may be performed as noted.  Right Findings: +----------+------------+---------+-----------+----------+-------+ RIGHT     CompressiblePhasicitySpontaneousPropertiesSummary +----------+------------+---------+-----------+----------+-------+ IJV           Full       Yes       Yes                      +----------+------------+---------+-----------+----------+-------+ Subclavian    Full       Yes       Yes                      +----------+------------+---------+-----------+----------+-------+ Axillary      Full       Yes       Yes                      +----------+------------+---------+-----------+----------+-------+ Brachial      Full       Yes       Yes                      +----------+------------+---------+-----------+----------+-------+ Radial        Full                                          +----------+------------+---------+-----------+----------+-------+ Ulnar         Full                                          +----------+------------+---------+-----------+----------+-------+ Cephalic      Full                                          +----------+------------+---------+-----------+----------+-------+ Basilic       Full                                           +----------+------------+---------+-----------+----------+-------+  Summary:  Right: No evidence of deep vein thrombosis in the upper extremity. No evidence of superficial vein thrombosis in the upper extremity.  *See table(s) above for measurements and observations.  Diagnosing physician: Gretta Began MD Electronically signed by Gretta Began MD on 01/26/2020 at 5:13:58 PM.    Final    CT Angio Abd/Pel w/ and/or w/o  Result Date: 01/26/2020 CLINICAL DATA:  79 year old with rectal bleeding. EXAM: CT ANGIOGRAPHY ABDOMEN AND PELVIS WITH CONTRAST AND WITHOUT CONTRAST TECHNIQUE: Multidetector CT imaging of the abdomen and pelvis was performed using the standard protocol during bolus administration of intravenous contrast. Multiplanar reconstructed images and MIPs were obtained and reviewed to evaluate the vascular anatomy. CONTRAST:  80mL OMNIPAQUE IOHEXOL 350 MG/ML SOLN COMPARISON:  CT abdomen 11/05/2009 FINDINGS: VASCULAR Aorta: Atherosclerotic calcifications in the abdominal aorta without aneurysm or dissection. Celiac: Celiac trunk is patent with scattered atherosclerotic calcifications. Extensive atherosclerotic calcifications involving the splenic artery. Main branches of the celiac trunk are patent. However, there is no significant contrast identified in the distal splenic artery and no significant enhancement of the spleen even on the venous phase imaging. Findings are suggestive for  marked arterial compromise to the spleen and distal splenic artery region. SMA: SMA is widely patent with atherosclerotic calcifications. Renals: Atherosclerotic calcifications involving bilateral renal arteries. Cannot exclude hemodynamically significant stenosis involving the origin the right renal artery. Less than 50% stenosis in left renal artery. IMA: Narrowing at the origin.  Patent. Inflow: Diffuse atherosclerotic calcifications in iliac arteries. Common and external iliac arteries are patent without significant stenosis.  There appears to be stenosis involving the left internal iliac artery. Right internal iliac artery is patent. Proximal Outflow: Proximal femoral arteries are patent bilaterally with atherosclerotic calcifications. Veins: Main portal venous system is patent. No abnormality to the IVC or iliac veins. Renal veins are patent. Review of the MIP images confirms the above findings. NON-VASCULAR Lower chest: Bilateral pleural effusions, right side greater than left. The right pleural effusion appears to be at least moderate in size. Compressive atelectasis in both lower lobes. Hepatobiliary: Multiple gallstones without gallbladder distention or inflammatory changes. Normal appearance of the liver. No biliary dilatation. Pancreas: Unremarkable. No pancreatic ductal dilatation or surrounding inflammatory changes. Spleen: Spleen size is normal. No significant enhancement in the spleen even on the delayed images. Adrenals/Urinary Tract: Adrenal glands are unremarkable. Cortical scarring in the posterior left kidney. Negative for hydronephrosis. Small amount of fluid in the urinary bladder. Stomach/Bowel: High-density stool in the colon on the noncontrast image limits evaluation for GI bleeding. No clear evidence for active GI bleeding on this examination. No acute inflammatory changes involving the appendix. Stomach and duodenum are unremarkable. No evidence for bowel dilatation or focal bowel inflammation. Diverticulosis involving the sigmoid colon. Lymphatic: No significant lymph node enlargement in the abdomen or pelvis. Reproductive: Prostate is unremarkable. Other: Extensive subcutaneous edema in the abdomen and pelvis. Presacral edema. No ascites. Negative for free air. Musculoskeletal: No acute bone abnormality. IMPRESSION: VASCULAR 1. No evidence for active GI bleeding. 2. Diffuse atherosclerotic disease involving the aorta, visceral arteries and iliac arteries. Aortic Atherosclerosis (ICD10-I70.0). 3. 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 Concern for a distal occlusion of the splenic artery or markedly decreased flow. 4. Right renal artery stenosis could be hemodynamically significant. NON-VASCULAR 1. Colonic diverticulosis. No evidence for acute bowel inflammation. 2. Extensive subcutaneous edema with bilateral pleural effusions. Findings are suggestive for fluid overload state. 3. Cholelithiasis. Electronically Signed   By: Richarda OverlieAdam  Henn M.D.   On: 01/26/2020 08:40      Subjective: No issues overnight. Wants to stay in the hospital. No GI bleeding noted. No bowel movement.  Discharge Exam: Vitals:   02/03/20 0829 02/03/20 0830  BP:  118/79  Pulse:  (!) 106  Resp:  18  Temp: 97.8 F (36.6 C)   SpO2:     Vitals:   02/03/20 0026 02/03/20 0314 02/03/20 0829 02/03/20 0830  BP: 120/70 108/75  118/79  Pulse: 93 91  (!) 106  Resp: 18 17  18   Temp: 98 F (36.7 C) 99.2 F (37.3 C) 97.8 F (36.6 C)   TempSrc: Oral Oral Oral   SpO2:  98%    Weight:  101.9 kg    Height:        General: Pt is alert, awake, not in acute distress Cardiovascular: RRR, S1/S2 +, no rubs, no gallops Respiratory: CTA bilaterally, no wheezing, no rhonchi Abdominal: Soft, NT, ND, bowel sounds + Extremities: LE edema, no cyanosis    The results of significant diagnostics from this hospitalization (including imaging, microbiology,  ancillary and laboratory) are listed below for reference.     Microbiology: Recent Results (from the past 240 hour(s))  SARS CORONAVIRUS 2 (TAT 6-24 HRS) Nasopharyngeal Nasopharyngeal Swab     Status: None   Collection Time: 01/28/20  2:17 PM   Specimen: Nasopharyngeal Swab  Result Value Ref Range Status   SARS Coronavirus 2 NEGATIVE NEGATIVE Final    Comment: (NOTE) SARS-CoV-2 target nucleic acids are NOT DETECTED.  The SARS-CoV-2 RNA is generally detectable in upper and  lower respiratory specimens during the acute phase of infection. Negative results do not preclude SARS-CoV-2 infection, do not rule out co-infections with other pathogens, and should not be used as the sole basis for treatment or other patient management decisions. Negative results must be combined with clinical observations, patient history, and epidemiological information. The expected result is Negative.  Fact Sheet for Patients: HairSlick.no  Fact Sheet for Healthcare Providers: quierodirigir.com  This test is not yet approved or cleared by the [place] FDA and  has been authorized for detection and/or diagnosis of SARS-CoV-2 by FDA under an Emergency Use Authorization (EUA). This EUA will remain  in effect (meaning this test can be used) for the duration of the COVID-19 declaration under Se ction 564(b)(1) of the Act, 21 U.S.C. section 360bbb-3(b)(1), unless the authorization is terminated or revoked sooner.  Performed at [facility], [address] [place], [place] [address]      Labs: BNP (last 3 results) Recent Labs    09/09/19 0320 12/07/19 0833  BNP 820.0* 2,150.1*   Basic Metabolic Panel: Recent Labs  Lab 01/28/20 0704 01/29/20 0226 01/30/20 0305 02/01/20 0122 02/01/20 0656 02/02/20 0451  NA 140 137 136  --  135 139  K 3.6 3.6 3.6  --  3.5 3.7  CL 110 107 107  --  106 108  CO2 22 23 22   --  22 22  GLUCOSE 117* 153* 117*  --  109* 104*  BUN 36* 34* 31*  --  30* 32*  CREATININE 1.85* 2.16* 1.73*  --  1.50* 1.73*  CALCIUM 7.6* 7.6* 7.7*  --  7.7* 7.8*  MG  --   --   --  2.0  --   --    Liver Function Tests: Recent Labs  Lab 02/01/20 0656  AST 13*  ALT 9  ALKPHOS 101  BILITOT 1.0  PROT 4.6*  ALBUMIN 1.8*   No results for input(s): LIPASE, AMYLASE in the last 168 hours. No results for input(s): AMMONIA in the last 168 hours. CBC: Recent Labs  Lab 01/28/20 0704 01/28/20 0704  01/29/20 0226 01/29/20 0226 01/30/20 0305 01/30/20 0305 01/31/20 0344 01/31/20 1954 02/01/20 1219 02/01/20 1628 02/02/20 0451 02/02/20 1936 02/03/20 0429  WBC 7.0  --  7.7  --  8.5  --  9.2  --   --   --   --   --  8.6  HGB 7.5*   < > 7.0*   < > 7.8*   < > 7.5*   < > 7.9* 7.6* 7.2* 8.6* 8.0*  HCT 23.7*   < > 22.5*   < > 24.8*   < > 23.9*   < > 25.1* 23.8* 22.9* 27.5* 25.3*  MCV 97.1  --  97.0  --  93.6  --  92.6  --   --   --   --   --  94.8  PLT 232  --  271  --  249  --  275  --   --   --   --   --  336   < > = values in this interval not displayed.   Cardiac Enzymes: No results for input(s): CKTOTAL, CKMB, CKMBINDEX, TROPONINI in the last 168 hours. BNP: Invalid input(s): POCBNP CBG: No results for input(s): GLUCAP in the last 168 hours. D-Dimer No results for input(s): DDIMER in the last 72 hours. Hgb A1c No results for input(s): HGBA1C in the last 72 hours. Lipid Profile No results for input(s): CHOL, HDL, LDLCALC, TRIG, CHOLHDL, LDLDIRECT in the last 72 hours. Thyroid function studies No results for input(s): TSH, T4TOTAL, T3FREE, THYROIDAB in the last 72 hours.  Invalid input(s): FREET3 Anemia work up No results for input(s): VITAMINB12, FOLATE, FERRITIN, TIBC, IRON, RETICCTPCT in the last 72 hours. Urinalysis    Component Value Date/Time   COLORURINE AMBER (A) 01/25/2020 2339   APPEARANCEUR CLOUDY (A) 01/25/2020 2339   LABSPEC 1.017 01/25/2020 2339   PHURINE 8.0 01/25/2020 2339   GLUCOSEU NEGATIVE 01/25/2020 2339   HGBUR NEGATIVE 01/25/2020 2339   BILIRUBINUR NEGATIVE 01/25/2020 2339   KETONESUR NEGATIVE 01/25/2020 2339   PROTEINUR 30 (A) 01/25/2020 2339   NITRITE NEGATIVE 01/25/2020 2339   LEUKOCYTESUR LARGE (A) 01/25/2020 2339   Sepsis Labs Invalid input(s): PROCALCITONIN,  WBC,  LACTICIDVEN Microbiology Recent Results (from the past 240 hour(s))  SARS CORONAVIRUS 2 (TAT 6-24 HRS) Nasopharyngeal Nasopharyngeal Swab     Status: None   Collection Time:  01/28/20  2:17 PM   Specimen: Nasopharyngeal Swab  Result Value Ref Range Status   SARS Coronavirus 2 NEGATIVE NEGATIVE Final    Comment: (NOTE) SARS-CoV-2 target nucleic acids are NOT DETECTED.  The SARS-CoV-2 RNA is generally detectable in upper and lower respiratory specimens during the acute phase of infection. Negative results do not preclude SARS-CoV-2 infection, do not rule out co-infections with other pathogens, and should not be used as the sole basis for treatment or other patient management decisions. Negative results must be combined with clinical observations, patient history, and epidemiological information. The expected result is Negative.  Fact Sheet for Patients: HairSlick.no  Fact Sheet for Healthcare Providers: quierodirigir.com  This test is not yet approved or cleared by the [place] FDA and  has been authorized for detection and/or diagnosis of SARS-CoV-2 by FDA under an Emergency Use Authorization (EUA). This EUA will remain  in effect (meaning this test can be used) for the duration of the COVID-19 declaration under Se ction 564(b)(1) of the Act, 21 U.S.C. section 360bbb-3(b)(1), unless the authorization is terminated or revoked sooner.  Performed at [facility], [address] [place], [place] [address]      Time coordinating discharge: 35 minutes  SIGNED:   Jacquelin Hawking, MD [facility] 02/03/2020, 10:46 AM

## 2020-02-04 DIAGNOSIS — Z20822 Contact with and (suspected) exposure to covid-19: Secondary | ICD-10-CM | POA: Diagnosis not present

## 2020-02-07 DIAGNOSIS — L89156 Pressure-induced deep tissue damage of sacral region: Secondary | ICD-10-CM | POA: Diagnosis not present

## 2020-02-08 DIAGNOSIS — I7389 Other specified peripheral vascular diseases: Secondary | ICD-10-CM | POA: Diagnosis not present

## 2020-02-08 DIAGNOSIS — I5022 Chronic systolic (congestive) heart failure: Secondary | ICD-10-CM | POA: Diagnosis not present

## 2020-02-08 DIAGNOSIS — R601 Generalized edema: Secondary | ICD-10-CM | POA: Diagnosis not present

## 2020-02-08 DIAGNOSIS — I700 Atherosclerosis of aorta: Secondary | ICD-10-CM | POA: Diagnosis not present

## 2020-02-08 DIAGNOSIS — K9289 Other specified diseases of the digestive system: Secondary | ICD-10-CM | POA: Diagnosis not present

## 2020-02-10 DIAGNOSIS — Z20822 Contact with and (suspected) exposure to covid-19: Secondary | ICD-10-CM | POA: Diagnosis not present

## 2020-02-14 DIAGNOSIS — L89156 Pressure-induced deep tissue damage of sacral region: Secondary | ICD-10-CM | POA: Diagnosis not present

## 2020-02-17 NOTE — Progress Notes (Deleted)
Patient ID: Larry Lawrence, male    DOB: Aug 06, 1940, 79 y.o.   MRN: [id number]  HPI  Larry Lawrence is a 79 y/o male with a history of CAD, CKD, gout, TIA, remote tobacco use and chronic heart failure.   Echo report from 12/08/19 reviewed and showed an EF of 20-25% along with mildly elevated PA pressure and mild/moderate Larry. Echo report from 07/20/2018 reviewed and showed an EF of 25-30% along with mild AR and moderate Larry/TR.   Catheterization done 02/02/16 showed an EF of 30% along with:  RPDA lesion, 55 %stenosed.  Prox RCA to Mid RCA lesion, 30 %stenosed.  Mid LAD to Dist LAD lesion, 20 %stenosed.  Prox LAD to Mid LAD lesion, 30 %stenosed.  Ramus-2 lesion, 40 %stenosed.  Ramus-1 lesion, 35 %stenosed.  Mid Cx to Dist Cx lesion, 40 %stenosed.  abnormal left ventricular function with ejection fraction of 30% and global dysfunction moderate 3 vessel coronary artery disease with patent lad stent There is not significant stenosis of left anterior descending, lcx , and or rca  Admitted 01/25/20 due to GIB due to anemia. GI and palliative care consults obtained. 3 units of PRBC's given. CT angiogram done without evidence of bleeding. Anasarca present but stable. Discharged after 9 days.   He presents today for a follow-up visit although hasn't been seen since February 2020. He presents with a chief complaint of   Past Medical History:  Diagnosis Date   Allergy    CHF (congestive heart failure) (HCC)    Coronary artery disease    GI bleeding 12/2019   Gout    Insomnia    Renal disorder    Sleeps in sitting position due to orthopnea    TIA (transient ischemic attack)    Past Surgical History:  Procedure Laterality Date   CARDIAC CATHETERIZATION N/A 02/02/2016   Procedure: Left Heart Cath and Coronary Angiography;  Surgeon: Larry Blinks, MD;  Location: [facility];  Service: Cardiovascular;  Laterality: N/A;   CARDIAC SURGERY     3 stents in "main artery"   JOINT  REPLACEMENT     Family History  Problem Relation Age of Onset   Hypertension Mother    Pancreatic cancer Mother    Hypertension Father    Prostate cancer Father    Social History   Tobacco Use   Smoking status: Former Smoker    Packs/day: 2.00    Years: 3.00    Pack years: 6.00    Types: Cigarettes    Quit date: 1960    Years since quitting: 61.6   Smokeless tobacco: Never Used   Tobacco comment: Smoking cessation materials not required  Substance Use Topics   Alcohol use: No   Allergies  Allergen Reactions   Niaspan [Niacin Er] Other (See Comments)    Reaction:  Hot flashes      Review of Systems  Constitutional: Positive for fatigue. Negative for appetite change.  HENT: Negative for congestion, rhinorrhea and sore throat.   Eyes: Negative.   Respiratory: Positive for cough and shortness of breath (with moderate exertion). Negative for chest tightness.   Cardiovascular: Positive for palpitations (at times) and leg swelling. Negative for chest pain.  Gastrointestinal: Negative for abdominal distention and abdominal pain.  Endocrine: Negative.   Genitourinary: Negative.   Musculoskeletal: Negative for back pain and neck pain.  Skin: Negative.   Allergic/Immunologic: Negative.   Neurological: Negative for dizziness and light-headedness.  Hematological: Negative for adenopathy. Bruises/bleeds easily.  Psychiatric/Behavioral: Negative for dysphoric mood and sleep disturbance (sleeping in recliner due to comfort). The patient is not nervous/anxious.      Physical Exam Vitals and nursing note reviewed.  Constitutional:      Appearance: He is well-developed.  HENT:     Head: Normocephalic and atraumatic.  Cardiovascular:     Rate and Rhythm: Regular rhythm. Bradycardia present.  Pulmonary:     Effort: Pulmonary effort is normal. No respiratory distress.     Breath sounds: No wheezing or rales.  Musculoskeletal:     Cervical back: Normal range of motion  and neck supple.     Right lower leg: Edema (1+ pitting) present.     Left lower leg: Edema (1+ pitting) present.  Skin:    General: Skin is warm and dry.  Neurological:     General: No focal deficit present.     Mental Status: He is alert and oriented to person, place, and time.  Psychiatric:        Behavior: Behavior normal.    Assessment & Plan:  1: Chronic heart failure with reduced ejection fraction- - NYHA class II - euvolemic today - weighing daily and he was reminded to call for an overnight weight gain of >2 pounds or a weekly weight gain of >5 pounds - not adding salt and his granddaughter-in-law does most of the cooking. [name] says that they have been reading food labels closely for sodium content. Reviewed the importance of closely following a 2000mg  sodium diet  - consider adding entresto if able in the future - saw cardiology ) 04/28/2019 - BNP 12/07/19 was 2150.1  2: Chronic kidney disease- - saw PCP 02/06/20) 09/15/19 - BMP from 02/02/20 reviewed and showed sodium 139, potassium 3.7, creatinine 1.73 and GFR 43  3: Lymphedema- - stage 2 - wearing compression socks but edema persists - elevating his legs "some" and he was encouraged to elevate them when sitting for long periods of time - limited in his ability to exercise due to mobility issues - consider lymphapress compression boots if edema persists  4: Post COVID- - saw pulmonology 04/03/20) 12/29/19   Patient did not bring his medications nor a list. Each medication was verbally reviewed with the patient and he was encouraged to bring the bottles to every visit to confirm accuracy of list.

## 2020-02-18 ENCOUNTER — Ambulatory Visit: Payer: Medicare Other | Admitting: Family

## 2020-02-18 ENCOUNTER — Telehealth: Payer: Self-pay | Admitting: Family

## 2020-02-18 NOTE — Telephone Encounter (Signed)
Patient did not show for his [facility] appointment on 02/18/20. Will attempt to reschedule.

## 2020-02-21 DIAGNOSIS — L89156 Pressure-induced deep tissue damage of sacral region: Secondary | ICD-10-CM | POA: Diagnosis not present

## 2020-02-23 DIAGNOSIS — I10 Essential (primary) hypertension: Secondary | ICD-10-CM | POA: Diagnosis not present

## 2020-02-23 DIAGNOSIS — I5023 Acute on chronic systolic (congestive) heart failure: Secondary | ICD-10-CM | POA: Diagnosis not present

## 2020-02-23 DIAGNOSIS — I480 Paroxysmal atrial fibrillation: Secondary | ICD-10-CM | POA: Diagnosis not present

## 2020-02-23 DIAGNOSIS — E039 Hypothyroidism, unspecified: Secondary | ICD-10-CM | POA: Diagnosis not present

## 2020-02-23 DIAGNOSIS — E785 Hyperlipidemia, unspecified: Secondary | ICD-10-CM | POA: Diagnosis not present

## 2020-02-24 DIAGNOSIS — K922 Gastrointestinal hemorrhage, unspecified: Secondary | ICD-10-CM | POA: Diagnosis not present

## 2020-02-24 DIAGNOSIS — N183 Chronic kidney disease, stage 3 unspecified: Secondary | ICD-10-CM | POA: Diagnosis not present

## 2020-02-24 DIAGNOSIS — I480 Paroxysmal atrial fibrillation: Secondary | ICD-10-CM | POA: Diagnosis not present

## 2020-02-24 DIAGNOSIS — I2510 Atherosclerotic heart disease of native coronary artery without angina pectoris: Secondary | ICD-10-CM | POA: Diagnosis not present

## 2020-02-28 DIAGNOSIS — Z20822 Contact with and (suspected) exposure to covid-19: Secondary | ICD-10-CM | POA: Diagnosis not present

## 2020-03-07 DIAGNOSIS — Z20822 Contact with and (suspected) exposure to covid-19: Secondary | ICD-10-CM | POA: Diagnosis not present

## 2020-03-09 DIAGNOSIS — Z20822 Contact with and (suspected) exposure to covid-19: Secondary | ICD-10-CM | POA: Diagnosis not present

## 2020-03-14 DIAGNOSIS — Z20822 Contact with and (suspected) exposure to covid-19: Secondary | ICD-10-CM | POA: Diagnosis not present

## 2020-03-21 DIAGNOSIS — L89156 Pressure-induced deep tissue damage of sacral region: Secondary | ICD-10-CM | POA: Diagnosis not present

## 2020-03-21 DIAGNOSIS — Z20822 Contact with and (suspected) exposure to covid-19: Secondary | ICD-10-CM | POA: Diagnosis not present

## 2020-03-31 DEATH — deceased

## 2020-07-13 IMAGING — CR DG CHEST 2V
2 series · 2 of 2 positions shown · non-contrast
Comparison: 12/19/2017

CLINICAL DATA: BILATERAL leg swelling for 1 week, exertional
dyspnea for 2 days, anasarca, shortness of breath, CHF, on Lasix

EXAM:
CHEST - 2 VIEW

[chest pa]
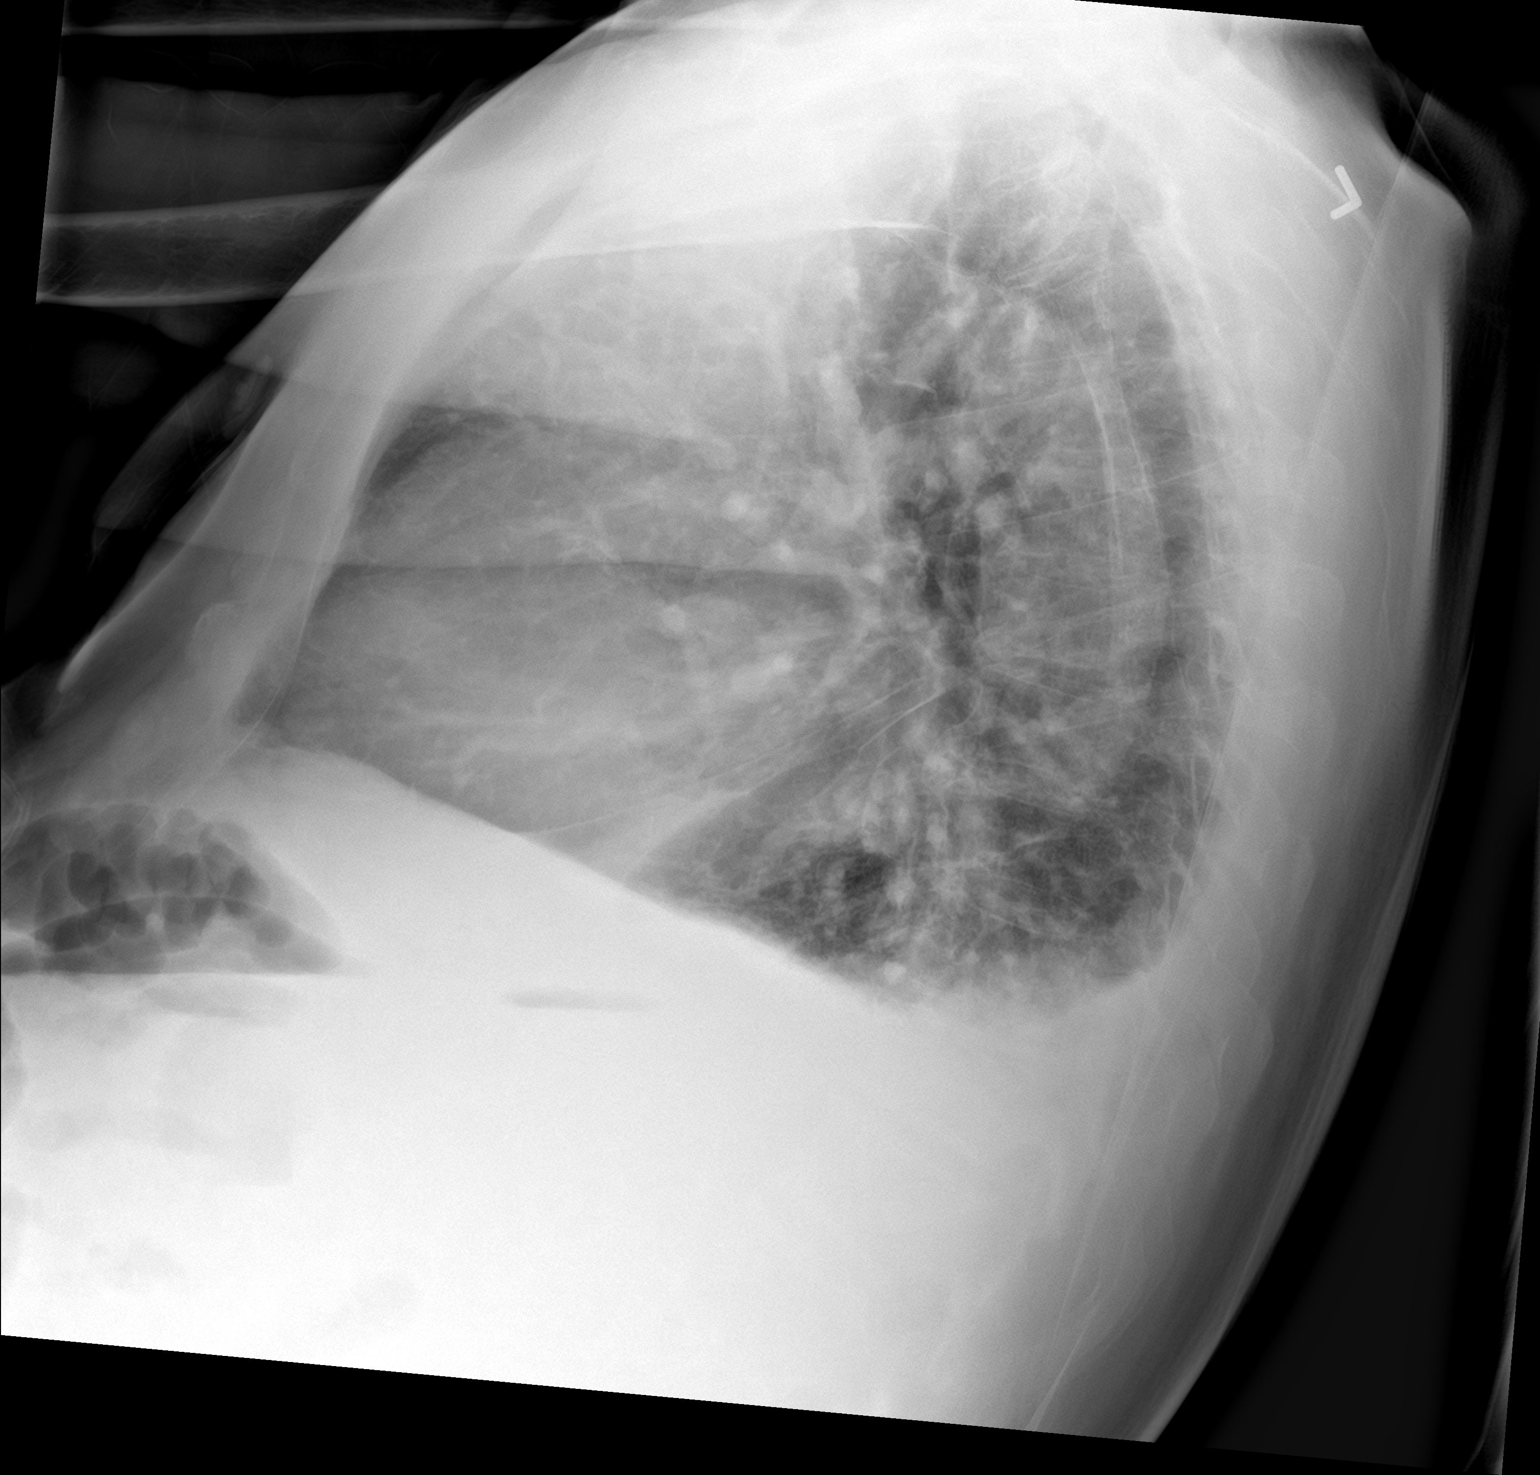

[chest ap]
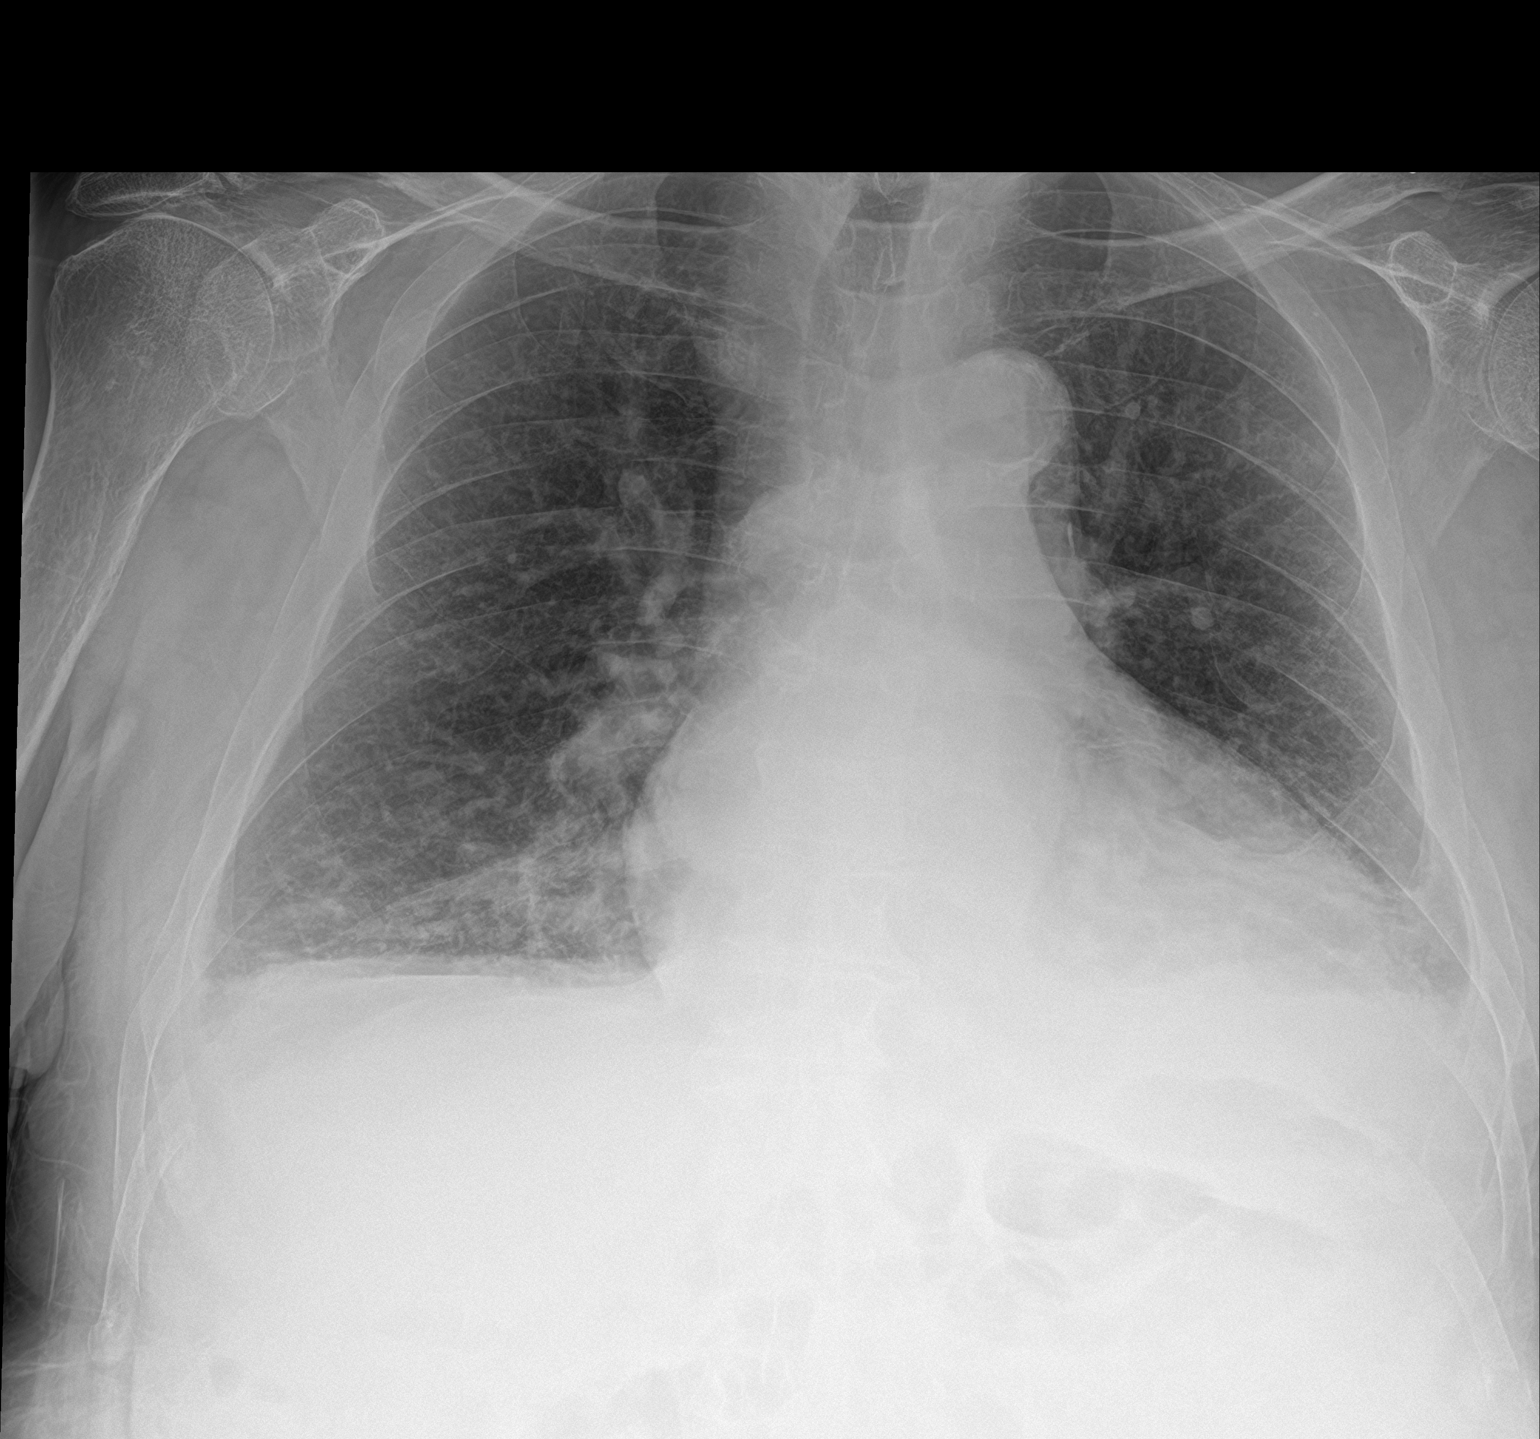

[2 of 2 positions shown; findings below may reference images not displayed]

FINDINGS: Enlargement of cardiac silhouette.

Atherosclerotic calcification aorta.

Mediastinal contours and pulmonary vascularity normal.

Bibasilar atelectasis and small pleural effusions.

No acute infiltrate or pneumothorax.

Bones demineralized.
IMPRESSION: Enlargement of cardiac silhouette.

Bibasilar pleural effusions and atelectasis.

## 2020-08-06 IMAGING — DX DG CHEST 1V PORT
1 series · 1 of 1 positions shown · non-contrast
Comparison: Chest radiograph May 20, 2018

CLINICAL DATA: Pain and swelling after fall.

EXAM:
PORTABLE CHEST 1 VIEW

[chest ap]
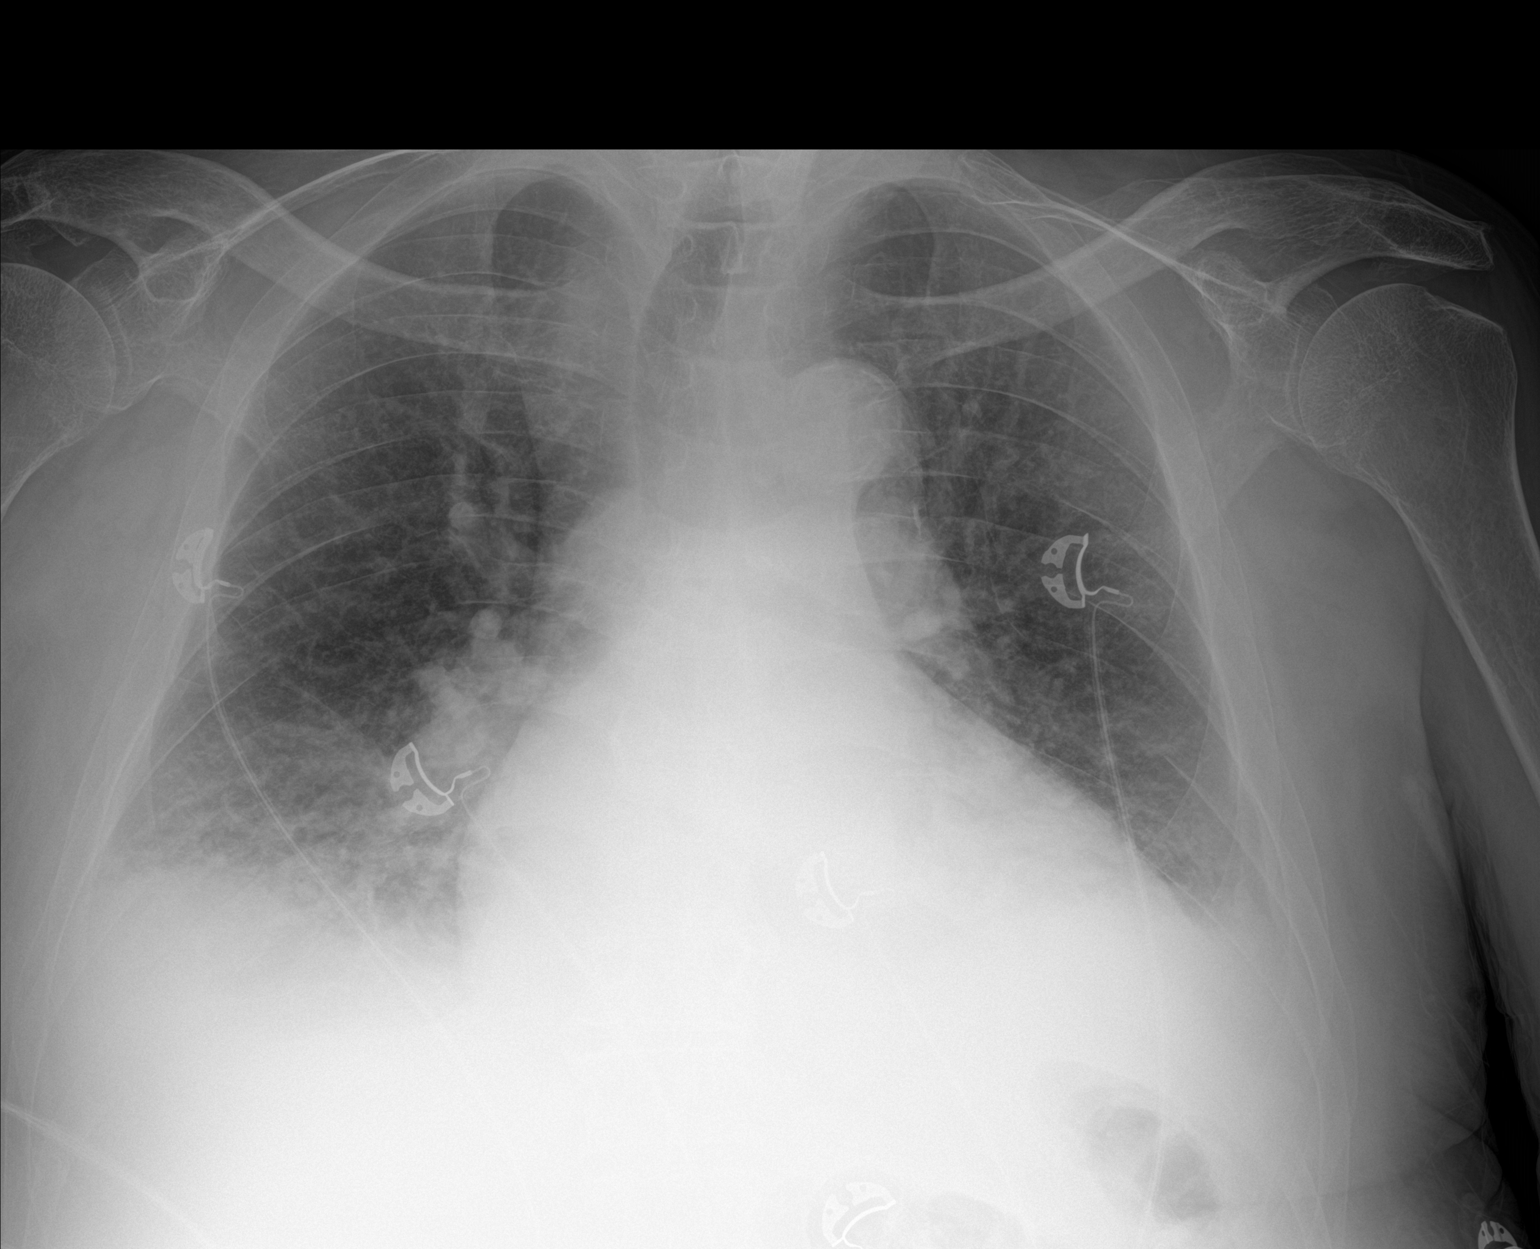

[1 of 1 positions shown; findings below may reference images not displayed]

FINDINGS: Stable cardiomegaly. Pulmonary vascular congestion mild interstitial
prominence with increased patchy bibasilar airspace opacities and
small pleural effusions. Calcified aortic arch. No pneumothorax.
Osteopenia.
IMPRESSION: Stable cardiomegaly. Interstitial edema confluent in lung bases
versus atelectasis. Small pleural effusions.

Aortic Atherosclerosis (NNIE6-LDM.M).
